# Patient Record
Sex: Male | Born: 1935
Health system: Southern US, Community
[De-identification: ages and names within clinical notes are randomized; demographics above are authoritative.]

## PROBLEM LIST (undated history)

## (undated) DIAGNOSIS — D649 Anemia, unspecified: Secondary | ICD-10-CM

## (undated) DIAGNOSIS — I2510 Atherosclerotic heart disease of native coronary artery without angina pectoris: Secondary | ICD-10-CM

## (undated) DIAGNOSIS — C801 Malignant (primary) neoplasm, unspecified: Secondary | ICD-10-CM

## (undated) DIAGNOSIS — J449 Chronic obstructive pulmonary disease, unspecified: Secondary | ICD-10-CM

## (undated) DIAGNOSIS — I340 Nonrheumatic mitral (valve) insufficiency: Secondary | ICD-10-CM

## (undated) DIAGNOSIS — J111 Influenza due to unidentified influenza virus with other respiratory manifestations: Secondary | ICD-10-CM

## (undated) DIAGNOSIS — K219 Gastro-esophageal reflux disease without esophagitis: Secondary | ICD-10-CM

## (undated) DIAGNOSIS — R112 Nausea with vomiting, unspecified: Secondary | ICD-10-CM

## (undated) DIAGNOSIS — R011 Cardiac murmur, unspecified: Secondary | ICD-10-CM

## (undated) DIAGNOSIS — E785 Hyperlipidemia, unspecified: Secondary | ICD-10-CM

## (undated) DIAGNOSIS — K5520 Angiodysplasia of colon without hemorrhage: Secondary | ICD-10-CM

## (undated) DIAGNOSIS — I341 Nonrheumatic mitral (valve) prolapse: Secondary | ICD-10-CM

## (undated) DIAGNOSIS — N400 Enlarged prostate without lower urinary tract symptoms: Secondary | ICD-10-CM

## (undated) DIAGNOSIS — Z87442 Personal history of urinary calculi: Secondary | ICD-10-CM

## (undated) DIAGNOSIS — J189 Pneumonia, unspecified organism: Secondary | ICD-10-CM

## (undated) DIAGNOSIS — M1990 Unspecified osteoarthritis, unspecified site: Secondary | ICD-10-CM

## (undated) DIAGNOSIS — Z9889 Other specified postprocedural states: Secondary | ICD-10-CM

## (undated) DIAGNOSIS — N189 Chronic kidney disease, unspecified: Secondary | ICD-10-CM

## (undated) DIAGNOSIS — R0602 Shortness of breath: Secondary | ICD-10-CM

## (undated) DIAGNOSIS — I10 Essential (primary) hypertension: Secondary | ICD-10-CM

## (undated) HISTORY — DX: Cardiac murmur, unspecified: R01.1

## (undated) HISTORY — DX: Nonrheumatic mitral (valve) prolapse: I34.1

## (undated) HISTORY — DX: Nonrheumatic mitral (valve) insufficiency: I34.0

## (undated) HISTORY — DX: Shortness of breath: R06.02

## (undated) HISTORY — DX: Unspecified osteoarthritis, unspecified site: M19.90

## (undated) HISTORY — DX: Atherosclerotic heart disease of native coronary artery without angina pectoris: I25.10

## (undated) HISTORY — DX: Angiodysplasia of colon without hemorrhage: K55.20

## (undated) HISTORY — PX: EYE SURGERY: SHX253

## (undated) HISTORY — DX: Chronic obstructive pulmonary disease, unspecified: J44.9

## (undated) HISTORY — PX: HERNIA REPAIR: SHX51

## (undated) HISTORY — DX: Essential (primary) hypertension: I10

## (undated) HISTORY — DX: Hyperlipidemia, unspecified: E78.5

## (undated) HISTORY — DX: Benign prostatic hyperplasia without lower urinary tract symptoms: N40.0

## (undated) SURGERY — ECHOCARDIOGRAM, TRANSESOPHAGEAL
Anesthesia: Moderate Sedation

---

## 1978-01-01 HISTORY — PX: LOBECTOMY: SHX5089

## 1997-12-08 ENCOUNTER — Ambulatory Visit (HOSPITAL_BASED_OUTPATIENT_CLINIC_OR_DEPARTMENT_OTHER): Admission: RE | Admit: 1997-12-08 | Discharge: 1997-12-08 | Payer: Self-pay | Admitting: General Surgery

## 2003-01-02 HISTORY — PX: REPLACEMENT TOTAL KNEE: SUR1224

## 2003-11-02 ENCOUNTER — Inpatient Hospital Stay (HOSPITAL_COMMUNITY): Admission: RE | Admit: 2003-11-02 | Discharge: 2003-11-06 | Payer: Self-pay | Admitting: Orthopedic Surgery

## 2004-05-30 ENCOUNTER — Encounter (INDEPENDENT_AMBULATORY_CARE_PROVIDER_SITE_OTHER): Payer: Self-pay | Admitting: *Deleted

## 2004-05-30 ENCOUNTER — Ambulatory Visit (HOSPITAL_COMMUNITY): Admission: RE | Admit: 2004-05-30 | Discharge: 2004-05-30 | Payer: Self-pay | Admitting: *Deleted

## 2004-09-19 ENCOUNTER — Encounter: Admission: RE | Admit: 2004-09-19 | Discharge: 2004-09-19 | Payer: Self-pay | Admitting: Internal Medicine

## 2004-10-09 ENCOUNTER — Encounter (HOSPITAL_COMMUNITY): Admission: RE | Admit: 2004-10-09 | Discharge: 2005-01-07 | Payer: Self-pay | Admitting: Nephrology

## 2005-01-08 ENCOUNTER — Encounter (HOSPITAL_COMMUNITY): Admission: RE | Admit: 2005-01-08 | Discharge: 2005-04-08 | Payer: Self-pay | Admitting: Nephrology

## 2005-06-05 ENCOUNTER — Encounter (HOSPITAL_COMMUNITY): Admission: RE | Admit: 2005-06-05 | Discharge: 2005-09-03 | Payer: Self-pay | Admitting: Nephrology

## 2005-09-27 ENCOUNTER — Encounter (HOSPITAL_COMMUNITY): Admission: RE | Admit: 2005-09-27 | Discharge: 2005-12-26 | Payer: Self-pay | Admitting: Nephrology

## 2009-01-01 HISTORY — PX: CATARACT EXTRACTION: SUR2

## 2009-08-01 HISTORY — PX: CARDIAC CATHETERIZATION: SHX172

## 2009-08-14 ENCOUNTER — Observation Stay (HOSPITAL_COMMUNITY): Admission: RE | Admit: 2009-08-14 | Discharge: 2009-08-15 | Payer: Self-pay | Admitting: Cardiovascular Disease

## 2009-12-09 ENCOUNTER — Ambulatory Visit (HOSPITAL_COMMUNITY)
Admission: RE | Admit: 2009-12-09 | Discharge: 2009-12-09 | Payer: Self-pay | Source: Home / Self Care | Attending: Gastroenterology | Admitting: Gastroenterology

## 2010-03-16 LAB — BASIC METABOLIC PANEL
BUN: 33 mg/dL — ABNORMAL HIGH (ref 6–23)
CO2: 24 mEq/L (ref 19–32)
Calcium: 9.3 mg/dL (ref 8.4–10.5)
Chloride: 106 mEq/L (ref 96–112)
Creatinine, Ser: 1.78 mg/dL — ABNORMAL HIGH (ref 0.4–1.5)
GFR calc Af Amer: 45 mL/min — ABNORMAL LOW (ref >60)
GFR calc non Af Amer: 37 mL/min — ABNORMAL LOW (ref >60)
Glucose, Bld: 116 mg/dL — ABNORMAL HIGH (ref 70–99)
Potassium: 4.4 mEq/L (ref 3.5–5.1)
Sodium: 140 mEq/L (ref 135–145)

## 2010-03-17 LAB — BASIC METABOLIC PANEL
BUN: 35 mg/dL — ABNORMAL HIGH (ref 6–23)
CO2: 24 mEq/L (ref 19–32)
Calcium: 9.2 mg/dL (ref 8.4–10.5)
Chloride: 104 mEq/L (ref 96–112)
Creatinine, Ser: 1.96 mg/dL — ABNORMAL HIGH (ref 0.4–1.5)
GFR calc Af Amer: 40 mL/min — ABNORMAL LOW (ref >60)
GFR calc non Af Amer: 33 mL/min — ABNORMAL LOW (ref >60)
Glucose, Bld: 159 mg/dL — ABNORMAL HIGH (ref 70–99)
Potassium: 4.7 mEq/L (ref 3.5–5.1)
Sodium: 134 mEq/L — ABNORMAL LOW (ref 135–145)

## 2010-03-17 LAB — CBC
HCT: 34.5 % — ABNORMAL LOW (ref 39.0–52.0)
Hemoglobin: 12.2 g/dL — ABNORMAL LOW (ref 13.0–17.0)
MCH: 32 pg (ref 26.0–34.0)
MCHC: 35.4 g/dL (ref 30.0–36.0)
MCV: 90.6 fL (ref 78.0–100.0)
Platelets: 138 10*3/uL — ABNORMAL LOW (ref 150–400)
RBC: 3.81 MIL/uL — ABNORMAL LOW (ref 4.22–5.81)
RDW: 13.2 % (ref 11.5–15.5)
WBC: 7.3 10*3/uL (ref 4.0–10.5)

## 2010-03-17 LAB — PROTIME-INR
INR: 1.06 (ref 0.00–1.49)
Prothrombin Time: 14 seconds (ref 11.6–15.2)

## 2010-03-17 LAB — TSH: TSH: 1.927 u[IU]/mL (ref 0.350–4.500)

## 2010-03-17 LAB — APTT: aPTT: 29 seconds (ref 24–37)

## 2010-05-19 NOTE — Discharge Summary (Signed)
[name], [name]              ACCOUNT NO.:  [id number]   MEDICAL RECORD NO.:  [id number]          PATIENT TYPE:  INP   LOCATION:  5030                         FACILITY:  [facility]   PHYSICIAN:  Deidre Ala, M.D.    DATE OF BIRTH:  May 03, 1925   DATE OF ADMISSION:  11/02/2003  DATE OF DISCHARGE:  11/06/2003                                 DISCHARGE SUMMARY   ADMISSION DIAGNOSES:  1.  End stage osteoarthritis of the right knee.  2.  Hypertension.   DISCHARGE DIAGNOSES:  1.  End stage osteoarthritis of the right knee, status post right total knee      arthroplasty.  2.  Hypertension.  3.  Postoperative hemorrhagic anemia, stable at the time of discharge.   PROCEDURE:  The patient was taken to the operating room, on November 02, 2003, and underwent a right total knee arthroplasty.  The surgeon was Dr.  Drema Pry.  Assistant was [employer], P.A.-C.  The surgery was  done under general anesthesia and a Hemovac drain x 2 were placed at the  time of surgery.   CONSULTS:  1.  Physical therapy.  2.  Occupational therapy.  3.  Social Web designer.   BRIEF HISTORY:  The patient is a 77 year old male with a longstanding  history of right knee pain.  He has previously failed conservative measures  up to this point including injections.  Radiographs taken in the office  revealed bone-on-bone arthritis in the medial compartment of the right knee.  Upon reviewing these x-rays, Dr. Renae Fickle felt it was best and with the failure  of conservative treatment to proceed with a right total knee arthroplasty.  The patient agreed.  The risks and benefits of the surgery were discussed  with the patient and the patient wished to proceed.   LABORATORY DATA:  CBC, on admission, showed a hemoglobin of 15.4, hematocrit  of 43.0, white blood cell count 7.4, red blood cell count 4.10.  Serial  H&H's were followed throughout the hospital stay.  Hemoglobin and hematocrit  did decline to  10.6 and 29.6, respectively, on November 05, 2003, but was  stable at the time of discharge.  Differential, on admission, was all within  normal limits.  Coagulation studies, on admission, were all within normal  limits.  PT INR at the time of discharge were 19.5 and 2.0, respectively on  Coumadin therapy.  Routine chemistry, on admission, showed a glucose high at  108.  Serial chemistries were followed throughout hospital stay.  Glucose  ranged from a low of 104, on November 05, 2003, to a high of 150 on November 03, 2003.  Sodium fell to 132 on November 04, 2003 and November 05, 2003 but  remained stable at the time of discharge.  Urinalysis, on admission, was all  within normal limits.  The patient's blood type is O negative and antibody  screen negative.   EKG, on admission, showed sinus rhythm with a first degree A-V block.  It  was an otherwise normal ECG.   I see no pre-op x-rays on the chart  at this time.   HOSPITAL COURSE:  The patient was admitted to [facility] and taken  to the operating room.  He underwent the above stated procedure without  complication.  The patient tolerated the procedure well, __________  orthopedic floor for continued postoperative care.  The patient, on post-op  day #1, resting comfortably.  He was complaining of nausea and vomiting.  Hemoglobin and hematocrit 11.3 and 31.5.  T-max was 99.  He was  neurovascularly intact to the right lower extremity.  The patient __________  physical therapy occupational therapy.  Percocet was changed to [employer] in the hopes of eliminating his nausea.  November 04, 2003, post-op  day #2, the patient was resting comfortably, decreased nausea and vomiting,  hemoglobin with hematocrit 10.8 and 29.9.  He was afebrile.  He was  neurovascularly intact to the right lower extremity.  Dressing was changed  today and the incision was clean, dry, and intact.  Hemovac was also D/C'd  today.  Mepergan was changed to  Vicodin.  He was placed on a Reglan  scheduled also.  He is to continue with physical therapy and occupational  therapy.  November 05, 2003, post-op #3.  The patient is resting comfortably.  He was slight slow with PT over the last few days.  His nausea and vomiting  were better.  Hemoglobin and hematocrit of 10.6 and 29.6.  He was  neurovascularly intact to the right lower extremity.  The incision was  clean, dry and intact.  The patient was to continue working with physical  therapy occupational therapy.  We are waiting on the patient's progress to  determine discharge.  November 06, 2003, post-op day #4.  The patient was  doing well with physical therapy.  He had met physical therapy goals and was  ready to be discharged.  The patient was subsequently discharged home on  November 06, 2003.   DISPOSITION:  The patient was discharged home on November 06, 2003.   DISCHARGE MEDICATIONS:  1.  Vicodin 1-2 p.o. q.4-6h. p.r.n. pain.  2.  Robaxin 500 mg one p.o. q.6-8h. p.r.n. spasm.  3.  Coumadin 6 mg one tablet every day until told otherwise.   ACTIVITY:  1.  The patient is weightbearing as tolerated to the right lower extremity.  2.  [facility] for home RN, PT, and OT.   WOUND CARE:  The patient is to perform daily dressing changes until no  drainage.  He may shower when no drainage.   FOLLOW UP:  The patient is to follow up with Dr. Renae Fickle two weeks from the  date of surgery.  He is to call the office for an appointment at [phone].   CONDITION ON DISCHARGE:  Stable and improved.       SW/MEDQ  D:  12/29/2003  T:  12/29/2003  Job:  [id number]

## 2010-05-19 NOTE — Op Note (Signed)
NAMEANANT, AGARD              ACCOUNT NO.:  [id number]   MEDICAL RECORD NO.:  [id number]          PATIENT TYPE:  INP   LOCATION:  2550                         FACILITY:  [facility]   PHYSICIAN:  Deidre Ala, M.D.    DATE OF BIRTH:  1925-09-28   DATE OF PROCEDURE:  11/02/2003  DATE OF DISCHARGE:                                 OPERATIVE REPORT   PREOPERATIVE DIAGNOSIS:  End-stage degenerative joint disease, right knee.   POSTOPERATIVE DIAGNOSIS:  End-stage degenerative joint disease, right knee.   PROCEDURE:  Right total knee arthroplasty using cemented DePuy components,  LCS-type rotating platform.   SURGEON:  1.  Charlesetta Shanks, M.D.   ASSISTANT:  Clarene Reamer, P.A.-C.   ANESTHESIA:  General endotracheal.   CULTURES:  None.   DRAINS:  Two medium Hemovacs to Autovac.   ESTIMATED BLOOD LOSS:  75 cc.   TOURNIQUET TIME:  1 hour and 14 minutes.   PATHOLOGICAL FINDINGS AND HISTORY:  [name] is a 77 year old male  patient of mine who I have followed for 7 years for degenerative changes in  the knee.  He has bone-on-bone and finally is to the point where he is  having significant discomfort and difficulty walking, pain with every step,  night pain, etc.  He desired to proceed with surgical intervention.  At  surgery, tricompartmental disease was noted, especially medially.  We placed  a standard plus femur, a #5 tibia, 12.5-mm insert rotating platform, and a  41-mm patella.  We had full extension where he had a slight flexion  contracture preoperatively with flexion to 120, stable to stressing and good  patellar tracking.  Antibiotics were used in the cement, Zinacef 750 mg per  batch for two batches.   DESCRIPTION OF PROCEDURE:  With adequate anesthesia obtained using  endotracheal technique and a femoral nerve block having been placed  preoperatively, the patient was placed in the supine position and the right  lower extremity was prepped from the toes to the  tourniquet in the standard  fashion.  After standard prepping and draping, Esmarch exsanguination was  used and the tourniquet was inflated up to 300 mmHg.  A median parapatellar  skin incision was followed by a median parapatellar retinacular incision.  The incision was deepened sharply with a knife and hemostasis obtained using  the Bovie electro coagulator.  We then everted the patella and excised the  fat pad, both menisci and the cruciates.  I then amputated the tibial spine  with a saw, placed the extramedullary tibial cutting jig and made the cut.  The cut was then slightly freshened with the guide, but no additional tibial  cut was made thereafter.  We did size the femur to a standard plus, placed  the intramedullary guide and anterior-posterior cutting jig for the femur,  set the rotation with a 10-mm insert but later felt that 12.5 was the better  flexion gap.  We then set the rotation, made the anterior-posterior cuts,  lollipop fit the 10 and the 12.5 and the 12.5 was tight at first but fit  perfectly in the end  and so we ended up going with the 12.5 after the first  cut on the femur.  We then placed a 4 degree valgus distal femoral cutting  jig in place and made the cut at the first notch.  The 10 was a little  hyper, the 12.5 was perfect and it fit in flexion, so we balanced the  flexion and extension gap and 12.5-mm insert.  We then placed anterior-  posterior chamfer and notch cutting jig in place and made those cuts as well  as the far posterior cuts with rather large posterior femoral condyles.  We  then exposed the tibia, sized to 5, made the central peg hole, broached for  the keel and then placed the 12.5 rotator platform on and the standard plus  femur on and articulated the knee to full extension and flexion to about  120.  We then counter fit the patella, it was somewhat eccentrically shaped,  we cut it down to about a 13, placed a 41-mm patellar button on,  replacing  11.  We made the holes for the patellar button with the three-hole template,  placed the three peg trial place and articulated the knee through a range of  motion.  I then removed all trial components and jet lavaged the knee while  the components were checked for sizing and cement was mixed with Zinacef.  We then exposed the proximal tibia, cemented on the tibial component,  impacted it and removed excess cement.  We then placed the 12.5 rotator  platform, we then cemented on the femoral component, impacted it and removed  excess cement.  We held the knee in full extension, removed excess cement,  and then held the knee in 40 degrees of flexion while the cement cured and  we cemented on the patella.  The patella was then cemented on and impacted  with the clamp, excess cement removed and held while the cement cured.  When  the cement was cured, the tourniquet was let down and bleeding points were  cauterized.  An additional jet lavage was carried out.  Hemovac drains were  placed in the mediolateral gutter and brought out through the superolateral  portal.  The wound was then closed in layers with #1 Vicryl on the  retinaculum, 0 and 2-0 Vicryl on the subcu and skin staples.  A bulky  sterile compression dressing was applied, the Hemovac hooked up to Autovac,  a knee immobilizer placed and the patient having tolerated the procedure  well was awakened and taken to the recovery room in satisfactory condition  for routine postoperative care, femoral nerve block and CPM.       VEP/MEDQ  D:  11/02/2003  T:  11/02/2003  Job:  [id number]   cc:   Lilia Pro, M.D.  [address]  [place]  [place] [address]  Fax: [phone]

## 2010-05-19 NOTE — Op Note (Signed)
NAMEMESSIYAH, [name]              ACCOUNT NO.:  [id number]   MEDICAL RECORD NO.:  [id number]          PATIENT TYPE:  AMB   LOCATION:  ENDO                         FACILITY:  [facility]   PHYSICIAN:  Georgiana Spinner, M.D.    DATE OF BIRTH:  Apr 15, 1925   DATE OF PROCEDURE:  DATE OF DISCHARGE:                                 OPERATIVE REPORT   PROCEDURE:  Colonoscopy.   INDICATIONS:  Hemoccult positivity.   ANESTHESIA:  Versed 7 milligrams, Demerol 65 milligrams.   PROCEDURE:  With the patient mildly sedated in the left lateral decubitus  position, the Olympus videoscopic colonoscope was inserted after normal  rectal exam and passed under direct vision to the cecum. There was some  pressure applied and it was slightly difficult to reach but we did get to  the cecum, identified by ileocecal valve and appendiceal orifice. Of note  there were two AVM's seen in the cecum. They were not bleeding.  They were  photographed only.  From this point colonoscope was then slowly withdrawn  taking circumferential views of colonic mucosa, stopping only in the rectum  which appeared normal on direct showed hemorrhoids on retroflexed view.  The  endoscope was straightened and withdrawn. The patient's vital signs, pulse  oximeter remained stable. The patient tolerated procedure well without  complications.   FINDINGS:  Arteriovenous malformations of cecum, internal hemorrhoids  otherwise unremarkable colonoscopic examination.   PLAN:  See endoscopy note for further details of follow-up. Of note the  patient received ampicillin and gentamicin prophylactic antibiotics for  these procedures.      GMO/MEDQ  D:  05/30/2004  T:  05/30/2004  Job:  [id number]

## 2010-05-19 NOTE — Op Note (Signed)
NAMESIGISMUND, CROSS              ACCOUNT NO.:  [id number]   MEDICAL RECORD NO.:  [id number]          PATIENT TYPE:  AMB   LOCATION:  ENDO                         FACILITY:  [facility]   PHYSICIAN:  Georgiana Spinner, M.D.    DATE OF BIRTH:  06/23/1925   DATE OF PROCEDURE:  05/30/2004  DATE OF DISCHARGE:                                 OPERATIVE REPORT   PROCEDURE:  Upper endoscopy.   INDICATIONS:  GERD and Hemoccult positivity.   ANESTHESIA:  Versed 1 mg.   DESCRIPTION OF PROCEDURE:  With the patient mildly sedated in the left  lateral decubitus position, the Olympus videoscopic endoscope was inserted  in the mouth and passed under direct vision through the esophagus which  appeared normal other than there was an ulcer at the GE junction. We entered  into the stomach and there was some mild amount of blood, but definitely  blackish material seen in the stomach. This was photographed. After we  suctioned this, the body of the stomach appeared to be quite erythematous.  This was photographed and multiple biopsies were taken. The prepyloric area  also had a focal area of the erythema, which also was photographed and  biopsied. The duodenal bulb and second portion of the duodenum appeared  normal. From this point the endoscope was then slowly withdrawn taking  circumferential views of duodenal mucosa until the endoscope had been pulled  back into the stomach and placed in retroflexion to view the stomach from  below. The endoscope was straightened and withdrawn, taking circumferential  views of the remaining gastric and esophageal mucosa. The patient's vital  signs and pulse oximeter remained stable. The patient tolerated procedure  well without apparent complications.   FINDINGS:  Erythema of antrum and body of stomach, both biopsied separately.  Ulcer of the esophagus, biopsied. Await biopsy report. The patient will call  me for results and follow up with me as an outpatient, but clearly  this  could be the cause of the patient's Hemoccult positivity.      GMO/MEDQ  D:  05/30/2004  T:  05/30/2004  Job:  [id number]

## 2010-05-19 NOTE — Op Note (Signed)
NAMENICHOLAI, WILLETTE              ACCOUNT NO.:  [id number]   MEDICAL RECORD NO.:  [id number]          PATIENT TYPE:  INP   LOCATION:  2899                         FACILITY:  [facility]   PHYSICIAN:  Deidre Ala, M.D.    DATE OF BIRTH:  10/04/1925   DATE OF PROCEDURE:  DATE OF DISCHARGE:                                 OPERATIVE REPORT   Audio too short to transcribe (less than 5 seconds)       VEP/MEDQ  D:  11/02/2003  T:  11/02/2003  Job:  [id number]

## 2011-04-02 HISTORY — PX: CARDIAC CATHETERIZATION: SHX172

## 2011-04-13 ENCOUNTER — Encounter (HOSPITAL_COMMUNITY): Payer: Self-pay | Admitting: Pharmacy Technician

## 2011-04-19 ENCOUNTER — Other Ambulatory Visit: Payer: Self-pay | Admitting: Cardiovascular Disease

## 2011-04-24 ENCOUNTER — Ambulatory Visit
Admission: RE | Admit: 2011-04-24 | Discharge: 2011-04-24 | Disposition: A | Discharge status: Home / Self Care | Payer: Medicare Other | Source: Ambulatory Visit | Attending: Cardiovascular Disease | Admitting: Cardiovascular Disease

## 2011-04-24 ENCOUNTER — Other Ambulatory Visit: Payer: Self-pay | Admitting: Cardiovascular Disease

## 2011-04-24 DIAGNOSIS — R0602 Shortness of breath: Secondary | ICD-10-CM

## 2011-04-25 ENCOUNTER — Other Ambulatory Visit (HOSPITAL_COMMUNITY): Payer: Self-pay | Admitting: Cardiology

## 2011-04-25 ENCOUNTER — Encounter (HOSPITAL_COMMUNITY): Payer: Self-pay | Admitting: Cardiology

## 2011-04-25 DIAGNOSIS — I340 Nonrheumatic mitral (valve) insufficiency: Secondary | ICD-10-CM

## 2011-04-25 DIAGNOSIS — N183 Chronic kidney disease, stage 3 unspecified: Secondary | ICD-10-CM

## 2011-04-25 DIAGNOSIS — I10 Essential (primary) hypertension: Secondary | ICD-10-CM | POA: Insufficient documentation

## 2011-04-25 DIAGNOSIS — R0600 Dyspnea, unspecified: Secondary | ICD-10-CM

## 2011-04-25 DIAGNOSIS — I2510 Atherosclerotic heart disease of native coronary artery without angina pectoris: Secondary | ICD-10-CM | POA: Insufficient documentation

## 2011-04-25 DIAGNOSIS — E785 Hyperlipidemia, unspecified: Secondary | ICD-10-CM | POA: Insufficient documentation

## 2011-04-25 DIAGNOSIS — M109 Gout, unspecified: Secondary | ICD-10-CM | POA: Insufficient documentation

## 2011-04-25 NOTE — Progress Notes (Signed)
Addended by: Corine Shelter on: 04/25/2011 01:53 PM   Modules accepted: Orders

## 2011-04-25 NOTE — H&P (Signed)
Patient ID: [name] MRN: [id number], DOB/AGE: Oct 24, 1925   Admit date: 04/30/11   Primary Physician: Dr [name] Primary Cardiologist: Dr [name]  HPI: 78 y/o pt with a history of MR. 2D Feb 2013 showed severe MR with borderline dil;ated LV. The pt has been followed by Dr [name]. He has recently complained of increasing dyspnea on exertion. He was seen by Dr [name] 04/13/11 and is to be admitted now for Rt and Lt heart cath. He is admitted the day before his procedure for IV hydration.   Problem List: No past medical history on file.  No past surgical history on file.   Allergies: No Known Allergies   Home Medications  (Not in a hospital admission)   No family history on file.   History   Social History  . Marital Status: Married    Spouse Name: N/A    Number of Children: N/A  . Years of Education: N/A   Occupational History  . Not on file.   Social History Main Topics  . Smoking status: Not on file  . Smokeless tobacco: Not on file  . Alcohol Use: Not on file  . Drug Use: Not on file  . Sexually Active: Not on file   Other Topics Concern  . Not on file   Social History Narrative  . No narrative on file     Review of Systems: General: negative for chills, fever, night sweats or weight changes.  Cardiovascular: negative for chest pain, dyspnea on exertion, edema, orthopnea, palpitations, paroxysmal nocturnal dyspnea or shortness of breath Dermatological: negative for rash Respiratory: negative for cough or wheezing Urologic: negative for hematuria Abdominal: negative for nausea, vomiting, diarrhea, bright red blood per rectum, melena, or hematemesis Neurologic: negative for visual changes, syncope, or dizziness All other systems reviewed and are otherwise negative except as noted above.  Physical Exam: 04/13/11 There were no vitals taken for this visit.  General appearance: alert, cooperative and no distress Neck: no carotid bruit, no JVD, supple,  symmetrical, trachea midline and thyroid not enlarged, symmetric, no tenderness/mass/nodules Lungs: clear to auscultation bilaterally Heart: regular rate and rhythm and 2/6 syst murmur apex Abdomen: not distended, non tender Extremities: no edema Pulses: 2+ and symmetric Skin: cool and dry Neurologic: Grossly normal    Labs:  No results found for this or any previous visit (from the past 24 hour(s)).     Radiology/Studies: Dg Chest 2 View  04/24/2011  *RADIOLOGY REPORT*  Clinical Data: Short of breath.  Pre cardiac cath  CHEST - 2 VIEW  Comparison: 08/14/2009  Findings: Surgical clips in the superior segment left lower lobe, unchanged.  No mass lesion is identified.  Negative for heart failure or pneumonia.  Mild pleural scarring in the apices bilaterally.  Mild COPD.  IMPRESSION: No acute cardiopulmonary disease.  Original Report Authenticated By: [name], M.D.    EKG: NSR without acute changes  ASSESSMENT AND PLAN:   Dyspnea Severe MR Moderate CAD by cath 8/11 Renal insufficiency, last SCr 1.6, Nov 2012 HTN Dyslipidemia COPD  Plan- admit for hydration to Rt and Lt heart cath  Tyler Pretty, PA-C 04/25/2011, 1:17 PM

## 2011-04-29 ENCOUNTER — Encounter (HOSPITAL_COMMUNITY): Payer: Self-pay

## 2011-04-29 ENCOUNTER — Inpatient Hospital Stay (HOSPITAL_COMMUNITY)
Admission: RE | Admit: 2011-04-29 | Discharge: 2011-05-01 | DRG: 287 | Disposition: A | Discharge status: Home / Self Care | Payer: Medicare Other | Source: Ambulatory Visit | Attending: Cardiovascular Disease | Admitting: Cardiovascular Disease

## 2011-04-29 DIAGNOSIS — J449 Chronic obstructive pulmonary disease, unspecified: Secondary | ICD-10-CM | POA: Diagnosis present

## 2011-04-29 DIAGNOSIS — M109 Gout, unspecified: Secondary | ICD-10-CM | POA: Diagnosis not present

## 2011-04-29 DIAGNOSIS — I129 Hypertensive chronic kidney disease with stage 1 through stage 4 chronic kidney disease, or unspecified chronic kidney disease: Secondary | ICD-10-CM | POA: Diagnosis present

## 2011-04-29 DIAGNOSIS — R0600 Dyspnea, unspecified: Secondary | ICD-10-CM | POA: Diagnosis present

## 2011-04-29 DIAGNOSIS — D649 Anemia, unspecified: Secondary | ICD-10-CM | POA: Diagnosis present

## 2011-04-29 DIAGNOSIS — I2510 Atherosclerotic heart disease of native coronary artery without angina pectoris: Secondary | ICD-10-CM | POA: Diagnosis present

## 2011-04-29 DIAGNOSIS — Z7982 Long term (current) use of aspirin: Secondary | ICD-10-CM

## 2011-04-29 DIAGNOSIS — Z79899 Other long term (current) drug therapy: Secondary | ICD-10-CM

## 2011-04-29 DIAGNOSIS — J4489 Other specified chronic obstructive pulmonary disease: Secondary | ICD-10-CM | POA: Diagnosis present

## 2011-04-29 DIAGNOSIS — I059 Rheumatic mitral valve disease, unspecified: Principal | ICD-10-CM | POA: Diagnosis present

## 2011-04-29 DIAGNOSIS — Z87891 Personal history of nicotine dependence: Secondary | ICD-10-CM

## 2011-04-29 DIAGNOSIS — E785 Hyperlipidemia, unspecified: Secondary | ICD-10-CM | POA: Diagnosis present

## 2011-04-29 DIAGNOSIS — I10 Essential (primary) hypertension: Secondary | ICD-10-CM | POA: Diagnosis present

## 2011-04-29 DIAGNOSIS — I340 Nonrheumatic mitral (valve) insufficiency: Secondary | ICD-10-CM | POA: Diagnosis present

## 2011-04-29 DIAGNOSIS — N183 Chronic kidney disease, stage 3 unspecified: Secondary | ICD-10-CM | POA: Diagnosis present

## 2011-04-29 HISTORY — DX: Other specified postprocedural states: R11.2

## 2011-04-29 HISTORY — DX: Other specified postprocedural states: Z98.890

## 2011-04-29 LAB — CBC
HCT: 24.7 % — ABNORMAL LOW (ref 39.0–52.0)
Hemoglobin: 8.1 g/dL — ABNORMAL LOW (ref 13.0–17.0)
MCH: 26.7 pg (ref 26.0–34.0)
MCHC: 32.8 g/dL (ref 30.0–36.0)
MCV: 81.5 fL (ref 78.0–100.0)
Platelets: 148 10*3/uL — ABNORMAL LOW (ref 150–400)
RBC: 3.03 MIL/uL — ABNORMAL LOW (ref 4.22–5.81)
RDW: 14.1 % (ref 11.5–15.5)
WBC: 5.5 10*3/uL (ref 4.0–10.5)

## 2011-04-29 LAB — IRON AND TIBC
Iron: 11 ug/dL — ABNORMAL LOW (ref 42–135)
Saturation Ratios: 3 % — ABNORMAL LOW (ref 20–55)
TIBC: 331 ug/dL (ref 215–435)
UIBC: 320 ug/dL (ref 125–400)

## 2011-04-29 LAB — URINALYSIS, ROUTINE W REFLEX MICROSCOPIC
Bilirubin Urine: NEGATIVE
Glucose, UA: NEGATIVE mg/dL
Hgb urine dipstick: NEGATIVE
Ketones, ur: NEGATIVE mg/dL
Leukocytes, UA: NEGATIVE
Nitrite: NEGATIVE
Protein, ur: NEGATIVE mg/dL
Specific Gravity, Urine: 1.008 (ref 1.005–1.030)
Urobilinogen, UA: 0.2 mg/dL (ref 0.0–1.0)
pH: 5.5 (ref 5.0–8.0)

## 2011-04-29 LAB — PROTIME-INR
INR: 1.05 (ref 0.00–1.49)
Prothrombin Time: 13.9 seconds (ref 11.6–15.2)

## 2011-04-29 LAB — TSH: TSH: 1.209 u[IU]/mL (ref 0.350–4.500)

## 2011-04-29 LAB — MRSA PCR SCREENING: MRSA by PCR: NEGATIVE

## 2011-04-29 MED ORDER — SODIUM CHLORIDE 0.9 % IV SOLN
100.0000 mg | Freq: Every day | INTRAVENOUS | Status: DC
  Administered 2011-04-30: 100 mg via INTRAVENOUS
  Filled 2011-04-29 (×4): qty 100

## 2011-04-29 MED ORDER — PANTOPRAZOLE SODIUM 40 MG PO TBEC
40.0000 mg | DELAYED_RELEASE_TABLET | Freq: Every day | ORAL | Status: DC
  Administered 2011-05-01: 40 mg via ORAL
  Filled 2011-04-29 (×2): qty 1

## 2011-04-29 MED ORDER — SODIUM CHLORIDE 0.9 % IV SOLN
INTRAVENOUS | Status: DC
  Administered 2011-04-29 – 2011-04-30 (×2): via INTRAVENOUS

## 2011-04-29 MED ORDER — ALPRAZOLAM 0.25 MG PO TABS: 0.2500 mg | ORAL_TABLET | Freq: Three times a day (TID) | ORAL | Status: DC | PRN

## 2011-04-29 MED ORDER — ZOLPIDEM TARTRATE 5 MG PO TABS: 5.0000 mg | ORAL_TABLET | Freq: Every evening | ORAL | Status: DC | PRN

## 2011-04-29 MED ORDER — ATORVASTATIN CALCIUM 10 MG PO TABS
10.0000 mg | ORAL_TABLET | Freq: Every day | ORAL | Status: DC
  Administered 2011-04-29 – 2011-04-30 (×2): 10 mg via ORAL
  Filled 2011-04-29 (×4): qty 1

## 2011-04-29 MED ORDER — SODIUM CHLORIDE 0.9 % IJ SOLN: 3.0000 mL | INTRAMUSCULAR | Status: DC | PRN

## 2011-04-29 MED ORDER — ATORVASTATIN CALCIUM 10 MG PO TABS
10.0000 mg | ORAL_TABLET | Freq: Every day | ORAL | Status: DC
  Filled 2011-04-29: qty 1

## 2011-04-29 MED ORDER — ACETAMINOPHEN 325 MG PO TABS: 650.0000 mg | ORAL_TABLET | ORAL | Status: DC | PRN

## 2011-04-29 MED ORDER — ASPIRIN 81 MG PO CHEW: 81.0000 mg | CHEWABLE_TABLET | Freq: Every day | ORAL | Status: DC

## 2011-04-29 MED ORDER — DILTIAZEM HCL ER COATED BEADS 300 MG PO CP24: 300.0000 mg | ORAL_CAPSULE | Freq: Every day | ORAL | Status: DC

## 2011-04-30 ENCOUNTER — Encounter (HOSPITAL_COMMUNITY)
Admission: RE | Disposition: A | Discharge status: Home / Self Care | Payer: Self-pay | Source: Ambulatory Visit | Attending: Cardiovascular Disease

## 2011-04-30 ENCOUNTER — Encounter (HOSPITAL_COMMUNITY): Payer: Self-pay

## 2011-04-30 HISTORY — PX: TEE WITHOUT CARDIOVERSION: SHX5443

## 2011-04-30 HISTORY — PX: CORONARY ANGIOGRAM: SHX5466

## 2011-04-30 HISTORY — PX: RIGHT HEART CATHETERIZATION: SHX5447

## 2011-04-30 HISTORY — PX: ABDOMINAL ANGIOGRAM: SHX5499

## 2011-04-30 LAB — POCT I-STAT 3, ART BLOOD GAS (G3+)
Acid-base deficit: 5 mmol/L — ABNORMAL HIGH (ref 0.0–2.0)
Bicarbonate: 19 mEq/L — ABNORMAL LOW (ref 20.0–24.0)
TCO2: 20 mmol/L (ref 0–100)

## 2011-04-30 LAB — COMPREHENSIVE METABOLIC PANEL
ALT: 7 U/L (ref 0–53)
AST: 14 U/L (ref 0–37)
Albumin: 3.1 g/dL — ABNORMAL LOW (ref 3.5–5.2)
Alkaline Phosphatase: 63 U/L (ref 39–117)
BUN: 26 mg/dL — ABNORMAL HIGH (ref 6–23)
CO2: 21 mEq/L (ref 19–32)
Calcium: 9 mg/dL (ref 8.4–10.5)
Chloride: 107 mEq/L (ref 96–112)
Creatinine, Ser: 1.64 mg/dL — ABNORMAL HIGH (ref 0.50–1.35)
GFR calc Af Amer: 43 mL/min — ABNORMAL LOW (ref >90)
GFR calc non Af Amer: 37 mL/min — ABNORMAL LOW (ref >90)
Glucose, Bld: 118 mg/dL — ABNORMAL HIGH (ref 70–99)
Potassium: 4.2 mEq/L (ref 3.5–5.1)
Sodium: 137 mEq/L (ref 135–145)
Total Bilirubin: 0.2 mg/dL — ABNORMAL LOW (ref 0.3–1.2)
Total Protein: 5.7 g/dL — ABNORMAL LOW (ref 6.0–8.3)

## 2011-04-30 LAB — CBC
HCT: 23.7 % — ABNORMAL LOW (ref 39.0–52.0)
Hemoglobin: 7.7 g/dL — ABNORMAL LOW (ref 13.0–17.0)
MCH: 26.4 pg (ref 26.0–34.0)
MCHC: 32.5 g/dL (ref 30.0–36.0)
MCV: 81.2 fL (ref 78.0–100.0)
Platelets: 141 10*3/uL — ABNORMAL LOW (ref 150–400)
RBC: 2.92 MIL/uL — ABNORMAL LOW (ref 4.22–5.81)
RDW: 14 % (ref 11.5–15.5)
WBC: 3.8 10*3/uL — ABNORMAL LOW (ref 4.0–10.5)

## 2011-04-30 LAB — POCT I-STAT 3, VENOUS BLOOD GAS (G3P V)
Bicarbonate: 19.2 mEq/L — ABNORMAL LOW (ref 20.0–24.0)
TCO2: 20 mmol/L (ref 0–100)
pCO2, Ven: 35.5 mmHg — ABNORMAL LOW (ref 45.0–50.0)
pH, Ven: 7.34 — ABNORMAL HIGH (ref 7.250–7.300)

## 2011-04-30 SURGERY — ECHOCARDIOGRAM, TRANSESOPHAGEAL
Anesthesia: Moderate Sedation

## 2011-04-30 SURGERY — CORONARY ANGIOGRAM

## 2011-04-30 MED ORDER — LIDOCAINE HCL (PF) 1 % IJ SOLN
INTRAMUSCULAR | Status: AC
  Filled 2011-04-30: qty 30

## 2011-04-30 MED ORDER — MIDAZOLAM HCL 10 MG/2ML IJ SOLN: 10.0000 mg | Freq: Once | INTRAMUSCULAR | Status: DC

## 2011-04-30 MED ORDER — SODIUM CHLORIDE 0.9 % IJ SOLN: 3.0000 mL | Freq: Two times a day (BID) | INTRAMUSCULAR | Status: DC

## 2011-04-30 MED ORDER — ONDANSETRON HCL 4 MG/2ML IJ SOLN: 4.0000 mg | Freq: Four times a day (QID) | INTRAMUSCULAR | Status: DC | PRN

## 2011-04-30 MED ORDER — BENZOCAINE 20 % MT SOLN: 1.0000 "application " | OROMUCOSAL | Status: DC | PRN

## 2011-04-30 MED ORDER — SODIUM CHLORIDE 0.9 % IV SOLN
INTRAVENOUS | Status: AC
  Administered 2011-04-30: 12:00:00 via INTRAVENOUS

## 2011-04-30 MED ORDER — DILTIAZEM HCL ER BEADS 300 MG PO CP24
300.0000 mg | ORAL_CAPSULE | Freq: Every day | ORAL | Status: DC
  Administered 2011-04-30 – 2011-05-01 (×2): 300 mg via ORAL
  Filled 2011-04-30 (×2): qty 1

## 2011-04-30 MED ORDER — MIDAZOLAM HCL 10 MG/2ML IJ SOLN
INTRAMUSCULAR | Status: AC
  Filled 2011-04-30: qty 2

## 2011-04-30 MED ORDER — ACETAMINOPHEN 325 MG PO TABS: 650.0000 mg | ORAL_TABLET | ORAL | Status: DC | PRN

## 2011-04-30 MED ORDER — FENTANYL CITRATE 0.05 MG/ML IJ SOLN
INTRAMUSCULAR | Status: DC | PRN
  Administered 2011-04-30 (×2): 25 ug via INTRAVENOUS

## 2011-04-30 MED ORDER — FENTANYL CITRATE 0.05 MG/ML IJ SOLN
INTRAMUSCULAR | Status: AC
  Filled 2011-04-30: qty 2

## 2011-04-30 MED ORDER — FENTANYL CITRATE 0.05 MG/ML IJ SOLN: 250.0000 ug | Freq: Once | INTRAMUSCULAR | Status: DC

## 2011-04-30 MED ORDER — MIDAZOLAM HCL 10 MG/2ML IJ SOLN
INTRAMUSCULAR | Status: DC | PRN
  Administered 2011-04-30: 1 mg via INTRAVENOUS
  Administered 2011-04-30 (×2): 2 mg via INTRAVENOUS

## 2011-04-30 MED ORDER — HEPARIN (PORCINE) IN NACL 2-0.9 UNIT/ML-% IJ SOLN
INTRAMUSCULAR | Status: AC
  Filled 2011-04-30: qty 1000

## 2011-04-30 MED ORDER — NITROGLYCERIN 0.2 MG/ML ON CALL CATH LAB
INTRAVENOUS | Status: AC
  Filled 2011-04-30: qty 1

## 2011-04-30 MED ORDER — ASPIRIN EC 325 MG PO TBEC
325.0000 mg | DELAYED_RELEASE_TABLET | Freq: Every day | ORAL | Status: DC
  Administered 2011-05-01: 325 mg via ORAL
  Filled 2011-04-30: qty 1

## 2011-04-30 MED ORDER — SODIUM CHLORIDE 0.45 % IV SOLN: INTRAVENOUS | Status: DC

## 2011-04-30 MED ORDER — SODIUM CHLORIDE 0.9 % IJ SOLN: 3.0000 mL | INTRAMUSCULAR | Status: DC | PRN

## 2011-04-30 MED ORDER — SODIUM CHLORIDE 0.9 % IV SOLN: 250.0000 mL | INTRAVENOUS | Status: DC | PRN

## 2011-04-30 MED ORDER — BUTAMBEN-TETRACAINE-BENZOCAINE 2-2-14 % EX AERO
INHALATION_SPRAY | CUTANEOUS | Status: DC | PRN
  Administered 2011-04-30: 2 via TOPICAL

## 2011-04-30 NOTE — Op Note (Signed)
[name] is a 78 y.o. male    [id number] LOCATION:  FACILITY: [facility]  PHYSICIAN: Nanetta Batty, M.D. [birth year]   DATE OF PROCEDURE:  04/30/2011  DATE OF DISCHARGE:  [facility]  CARDIAC CATHETERIZATION     History obtained from chart review. 77 y/o pt with a history of MR. 2D Feb 2013 showed severe MR with borderline dil;ated LV. The pt has been followed by Dr Allyson Sabal. He has recently complained of increasing dyspnea on exertion. He was seen by Dr Allyson Sabal 04/13/11 and is to be admitted now for Rt and Lt heart cath. He is admitted the day before his procedure for IV hydration.    PROCEDURE DESCRIPTION:    The patient was brought to the second floor  [place] [facility]iac cath lab in the postabsorptive state. He was not  premedicated . His left groin was prepped and shaved in usual sterile fashion. Xylocaine 1% was used  for local anesthesia. A 5 French sheath was inserted into the left common femoral  artery using standard Seldinger technique.a 7 [place] sheath was inserted into the left common femoral vein using standard Seldinger technique. A 7 [place] balloon-tip Swan-Ganz catheter was then advanced through the right heart chambers using sequential pressures as well as blood samples to allow the performance Fick and thermal dilution cardiac outputs. 5 French right and left Judkins diagnostic catheters as well as a 5 French pigtail catheter was used for selective coronary angiography and abdominal aortography. Visipaque dye was used for the entirety of the case (95 cc). Retrograde aortic pressure was monitored during the case.    HEMODYNAMICS:    AO SYSTOLIC/AO DIASTOLIC: 151/63  RA pressure: 10/9  RV pressure: 13/12  Pulmonary artery pressure: 33/11 mean of 22  Pulmonary capillary. Wedge pressure:17/16, mean 14  Cardiac index by Fick was 4.1 L per minute per meter squared, and by thermodilution was 2.3 L per minute per meter  squared   ANGIOGRAPHIC RESULTS:   1. Left main; normal  2. LAD; minor irregularities 3. Left circumflex; normal. There was a high marginal branch and the ramus distribution though small in caliber and had a focal 50% proximal stenosis.  4. Right coronary artery; 70% just after the takeoff of a high acute marginal branch 5. Abdominal aortogram: The thoracic abdominal aorta associated renal abdominal, aortoiliac medication were free of significant atherosclerotic changes  IMPRESSION:[name] has severe MR by Three-D TEE and normal LV function with essentially one-vessel CAD involving the right coronary artery. He does have moderate renal insufficiency I will be hydrated. He is also significantly anemic and will be transfused 2 units of packed red blood cells. Dr. Jeani Hawking, gastroenterologist, who has seen the patient in the past for gastric AVMs will be consulted.  Runell Gess MD, [facility] 04/30/2011 11:37 AM

## 2011-04-30 NOTE — CV Procedure (Signed)
INDICATIONS: Mitral insufficiency  PROCEDURE:   Informed consent was obtained prior to the procedure. The risks, benefits and alternatives for the procedure were discussed and the patient comprehended these risks.  Risks include, but are not limited to, cough, sore throat, vomiting, nausea, somnolence, esophageal and stomach trauma or perforation, bleeding, low blood pressure, aspiration, pneumonia, infection, trauma to the teeth and death.    After a procedural time-out, the oropharynx was anesthetized with 20% benzocaine spray. The patient was given 5 mg versed and 50 mcg fentanyl for moderate sedation.   The transesophageal probe was inserted in the esophagus and stomach without difficulty and multiple views were obtained.  The patient was kept under observation until the patient left the procedure room.  The patient left the procedure room in stable condition.   Agitated microbubble saline contrast was not administered.  COMPLICATIONS:    There were no immediate complications.  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 Preserved LV systolic function (EF60%). Moderate TR. Mild pulmonary HTN.  RECOMMENDATIONS:   Proceed with heart cath prior to surgical valve repair.  Time Spent Directly with the Patient:  35 minutes   Hesham Womac 04/30/2011, 9:39 AM

## 2011-04-30 NOTE — H&P (Signed)
    Pt was reexamined and existing H & P reviewed. No changes found.  Runell Gess, MD [facility] 04/30/2011 10:39 AM

## 2011-04-30 NOTE — H&P (Signed)
  Date of Initial H&P: 04/25/2011  History reviewed, patient examined, no change in status, stable for surgery. TEE for Mr evaluation. Thurmon Fair, MD, [facility] [phone] office [phone] pager 04/30/2011 8:21 AM

## 2011-04-30 NOTE — Progress Notes (Signed)
TCTS BRIEF PROGRESS NOTE   Patient seen and examined, chart, TEE and cath reviewed.  Full consultation note to follow.    Will await consultation from GI and further work up for anemia before making any plans for surgery.  Rankin Coolman H 04/30/2011 8:21 PM

## 2011-04-30 NOTE — Progress Notes (Signed)
UR Completed. Simmons, Rafiel Mecca F [phone]  

## 2011-04-30 NOTE — Progress Notes (Signed)
  Echocardiogram Echocardiogram Transesophageal has been performed.  [name] 04/30/2011, 9:56 AM

## 2011-04-30 NOTE — Progress Notes (Signed)
   CARE MANAGEMENT NOTE 04/30/2011  Patient:  [name], [name]   Account Number:  [id number]  Date Initiated:  04/30/2011  Documentation initiated by:  GRAVES-BIGELOW,Deshon Hsiao  Subjective/Objective Assessment:   Pt admitted with hx MR/ renal insufficiency. Per MD notes pt having increased SOB. Plan for L& RHC- plan for transfusion 2 units PRBC for anemia.     Action/Plan:   Anticipated DC Date:  05/02/2011   Anticipated DC Plan:  HOME W HOME HEALTH SERVICES      DC Planning Services  CM consult      Choice offered to / List presented to:             Status of service:  In process, will continue to follow Medicare Important Message given?   (If response is "NO", the following Medicare IM given date fields will be blank) Date Medicare IM given:   Date Additional Medicare IM given:    Discharge Disposition:    Per UR Regulation:    If discussed at Long Length of Stay Meetings, dates discussed:    Comments:  04-30-11 1600 Tomi Bamberger, RN,BSN [phone] CM will continue to monitor for disposition needs.

## 2011-05-01 ENCOUNTER — Encounter (HOSPITAL_COMMUNITY): Payer: Self-pay | Admitting: Cardiovascular Disease

## 2011-05-01 DIAGNOSIS — D649 Anemia, unspecified: Secondary | ICD-10-CM | POA: Diagnosis present

## 2011-05-01 LAB — BASIC METABOLIC PANEL
BUN: 28 mg/dL — ABNORMAL HIGH (ref 6–23)
CO2: 20 mEq/L (ref 19–32)
Calcium: 9.4 mg/dL (ref 8.4–10.5)
Chloride: 104 mEq/L (ref 96–112)
Creatinine, Ser: 1.64 mg/dL — ABNORMAL HIGH (ref 0.50–1.35)
GFR calc Af Amer: 43 mL/min — ABNORMAL LOW (ref >90)
GFR calc non Af Amer: 37 mL/min — ABNORMAL LOW (ref >90)
Glucose, Bld: 199 mg/dL — ABNORMAL HIGH (ref 70–99)
Potassium: 4.4 mEq/L (ref 3.5–5.1)
Sodium: 138 mEq/L (ref 135–145)

## 2011-05-01 LAB — HEMOGLOBIN AND HEMATOCRIT, BLOOD
HCT: 27.1 % — ABNORMAL LOW (ref 39.0–52.0)
Hemoglobin: 8.6 g/dL — ABNORMAL LOW (ref 13.0–17.0)

## 2011-05-01 LAB — PREPARE RBC (CROSSMATCH)

## 2011-05-01 MED ORDER — ACETAMINOPHEN 325 MG PO TABS: 650.0000 mg | ORAL_TABLET | ORAL | Status: DC | PRN

## 2011-05-01 MED ORDER — FERROUS SULFATE 325 (65 FE) MG PO TABS: 325.0000 mg | ORAL_TABLET | Freq: Every day | ORAL | Status: DC

## 2011-05-01 MED ORDER — ALLOPURINOL 100 MG PO TABS
100.0000 mg | ORAL_TABLET | Freq: Every day | ORAL | Status: DC
  Administered 2011-05-01: 100 mg via ORAL
  Filled 2011-05-01: qty 1

## 2011-05-01 MED ORDER — ASPIRIN 81 MG PO CHEW: 81.0000 mg | CHEWABLE_TABLET | Freq: Every day | ORAL | Status: DC

## 2011-05-01 NOTE — Progress Notes (Signed)
Subjective:  No SOB. He says he had a BM that was checked and was negative, no record of this in the chart. He does not appear to be pale to me.  Objective:  Vital Signs in the last 24 hours: Temp:  [97.7 F (36.5 C)-98.1 F (36.7 C)] 97.7 F (36.5 C) (04/30 0812) Pulse Rate:  [52-77] 66  (04/30 0812) Resp:  [6-22] 18  (04/30 0812) BP: (109-169)/(43-75) 149/65 mmHg (04/30 0812) SpO2:  [92 %-100 %] 100 % (04/30 0812) Weight:  [77.9 kg (171 lb 11.8 oz)] 77.9 kg (171 lb 11.8 oz) (04/30 0400)  Intake/Output from previous day:  Intake/Output Summary (Last 24 hours) at 05/01/11 0901 Last data filed at 05/01/11 0005  Gross per 24 hour  Intake    800 ml  Output    375 ml  Net    425 ml    Physical Exam: General appearance: alert, cooperative and no distress Lungs: clear to auscultation bilaterally Heart: regular rate and rhythm and 2/6 systolic murmur Lt groin without hematoma   Rate: 85  Rhythm: normal sinus rhythm  Lab Results:  Basename 04/30/11 0504 04/29/11 1440  WBC 3.8* 5.5  HGB 7.7* 8.1*  PLT 141* 148*    Basename 04/30/11 0504  NA 137  K 4.2  CL 107  CO2 21  GLUCOSE 118*  BUN 26*  CREATININE 1.64*   No results found for this basename: TROPONINI:2,CK,MB:2 in the last 72 hours Hepatic Function Panel  Basename 04/30/11 0504  PROT 5.7*  ALBUMIN 3.1*  AST 14  ALT 7  ALKPHOS 63  BILITOT 0.2*  BILIDIR --  IBILI --   No results found for this basename: CHOL in the last 72 hours  Basename 04/29/11 1440  INR 1.05    Imaging: Imaging results have been reviewed  Cardiac Studies:  Assessment/Plan:   Principal Problem:  *Dyspnea Active Problems:  Mitral regurgitation, severe  CAD, single vessel RCA  HTN (hypertension)  Chronic renal insufficiency, stage III (moderate)  Anemia, Hx of colonic AVMs 12/11  Dyslipidemia  Gout  Plan- check Hgb/Hct, try again to contact GI, transfuse if Hgb < 8. Check BMP this am.    Corine Shelter PA-C 05/01/2011,  9:01 AM    Agree with note written by Corine Shelter PAC  Hgb appears better. Groin OK. Dr. Cornelius Moras has seen briefly and set up an out pt office appointment. Dr. Elnoria Howard to see today for eval of decreased Hgb. ? EGD. Will need to go on Fe as OP.   Runell Gess 05/01/2011 11:13 AM

## 2011-05-01 NOTE — Consult Note (Signed)
Reason for Consult: Anemia and history of AVMs Referring Physician: Johnathan Berry, M.D.  [name] HPI: This is an 78 year old patient who is well-known to me with a history of duodenal and proximal colonic AVMs.  The patient was evaluated in the past for an anemia and the procedures identified the lesions.  His EGD revealed a nonbleeding duodenal AVM and the colon exhibited two large cecal and ascending colon AVMs (11/2009).  The lesions were ablated and his HGB increased over a 6 month period with iron supplementation.  His last HGB check in the office was on 07/2010 with a value of 13.4 g/dL.  He had another check by another physician in 11/2010 with a value of 13.1 g/dL.  From that time point until the time of his admission his HGB dropped down to the 7 range.  He is heme negative and his cardiac cath only revealed mild CAD.  He has a history of severe MR.  As a result of his anemia, GI was consulted.  No reports of melena, hematochezia, diarrhea, or constipation.  Past Medical History  Diagnosis Date  . Coronary artery disease   . Hypertension   . Heart murmur   . COPD (chronic obstructive pulmonary disease)   . Shortness of breath   . Arthritis   . PONV (postoperative nausea and vomiting)     Past Surgical History  Procedure Date  . Cardiac catheterization   . Lobectomy 1963  . Knee surgery 2005  . Hernia repair 1999&2000  . Tee without cardioversion 04/30/2011    Procedure: TRANSESOPHAGEAL ECHOCARDIOGRAM (TEE);  Surgeon: Mihai Croitoru, MD;  Location: [facility];  Service: Cardiovascular;  Laterality: N/A;  3D TEE/ patient will be admitted the day before test , therefo patient will be a inpatient the day of TEE    History reviewed. No pertinent family history.  Social History:  reports that he quit smoking about 50 years ago. He quit smokeless tobacco use about 50 years ago. He reports that he drinks about .6 ounces of alcohol per week. He reports that he does not use  illicit drugs.  Allergies: No Known Allergies  Medications:  Scheduled:   . allopurinol  100 mg Oral Daily  . aspirin  81 mg Oral Daily  . atorvastatin  10 mg Oral q1800  . diltiazem  300 mg Oral Daily  . pantoprazole  40 mg Oral Q0600  . DISCONTD: allopurinol (ZYLOPRIM) IVPB  100 mg Intravenous Daily  . DISCONTD: aspirin  81 mg Oral Daily  . DISCONTD: aspirin EC  325 mg Oral Daily  . DISCONTD: fentaNYL  250 mcg Intravenous Once  . DISCONTD: midazolam  10 mg Intravenous Once  . DISCONTD: sodium chloride  3 mL Intravenous Q12H   Continuous:   . sodium chloride 75 mL/hr at 04/30/11 1130  . DISCONTD: sodium chloride    . DISCONTD: sodium chloride 75 mL/hr at 04/30/11 0638    Results for orders placed during the hospital encounter of 04/29/11 (from the past 24 hour(s))  OCCULT BLOOD X 1 CARD TO LAB, STOOL     Status: Normal   Collection Time   04/30/11  9:05 PM      Component Value Range   Fecal Occult Bld NEGATIVE    HEMOGLOBIN AND HEMATOCRIT, BLOOD     Status: Abnormal   Collection Time   05/01/11  9:11 AM      Component Value Range   Hemoglobin 8.6 (*) 13.0 - 17.0 (g/dL)     HCT 27.1 (*) 39.0 - 52.0 (%)  BASIC METABOLIC PANEL     Status: Abnormal   Collection Time   05/01/11  9:11 AM      Component Value Range   Sodium 138  135 - 145 (mEq/L)   Potassium 4.4  3.5 - 5.1 (mEq/L)   Chloride 104  96 - 112 (mEq/L)   CO2 20  19 - 32 (mEq/L)   Glucose, Bld 199 (*) 70 - 99 (mg/dL)   BUN 28 (*) 6 - 23 (mg/dL)   Creatinine, Ser 1.64 (*) 0.50 - 1.35 (mg/dL)   Calcium 9.4  8.4 - 10.5 (mg/dL)   GFR calc non Af Amer 37 (*) >90 (mL/min)   GFR calc Af Amer 43 (*) >90 (mL/min)  PREPARE RBC (CROSSMATCH)     Status: Normal   Collection Time   05/01/11  9:20 AM      Component Value Range   Order Confirmation ORDER PROCESSED BY BLOOD BANK    TYPE AND SCREEN     Status: Normal (Preliminary result)   Collection Time   05/01/11  9:20 AM      Component Value Range   ABO/RH(D) O NEG      Antibody Screen NEG     Sample Expiration 05/04/2011     Unit Number [id number]     Blood Component Type RBC CPDA1, LR     Unit division 00     Status of Unit ALLOCATED     Transfusion Status OK TO TRANSFUSE     Crossmatch Result Compatible     Unit Number [id number]     Blood Component Type RED CELLS,LR     Unit division 00     Status of Unit ALLOCATED     Transfusion Status OK TO TRANSFUSE     Crossmatch Result Compatible    ABO/RH     Status: Normal   Collection Time   05/01/11  9:20 AM      Component Value Range   ABO/RH(D) O NEG       No results found.  ROS:  As stated above in the HPI otherwise negative.  Blood pressure 149/65, pulse 66, temperature 97.7 F (36.5 C), temperature source Oral, resp. rate 18, height 6' 1" (1.854 m), weight 77.9 kg (171 lb 11.8 oz), SpO2 100.00%.    PE: Gen: NAD, Alert and Oriented HEENT:  [place]amp/AT, EOMI Neck: Supple, no LAD Lungs: CTA Bilaterally CV: RRR ABM: Soft, NTND, +BS Ext: No C/C/E  Assessment/Plan: 1) Recurrent anemia. 2) History of AVMs. 3) CAD and MR.   The patient is stable from the cardiac standpoint.  He is s/p 2 units of PRBC and current HGB is at 8.6 g/dL.  No complaints of any chest pain or SOB.  I offered to perform the EGD/Colonoscopy tomorrow, but he would like to go home first.  From a GI standpoint it is not an unreasonable request and I spoke with Dr. Berry who feels that he can go home.  I will have him undergo the procedures this coming Friday.  Plan: 1) Outpatient EGD/Colonoscopy this Friday.  I will make the arrangements. 2) He knows to return to the hospital if his condition changes, i.e., hematochezia, melena, chest pain, or SOB.  [name] D 05/01/2011, 12:47 PM       

## 2011-05-01 NOTE — Discharge Summary (Signed)
Physician Discharge Summary  Patient ID: [name] MRN: [id number] DOB/AGE: [birth year] 78 y.o.  Admit date: 04/29/2011 Discharge date: 05/01/2011  Discharge Diagnoses:  Principal Problem:  *Dyspnea Active Problems:  Mitral regurgitation, severe  Anemia, recurrent Hx of colonic AVMs 12/11, transfused, heme stool neg.  CAD, single vessel RCA  Chronic renal insufficiency, stage III (moderate)  HTN (hypertension)  Dyslipidemia  Gout   Discharged Condition: good  Procedures:  04/30/2011 TEE by Dr. Royann Shivers.  04/30/2011 right and left heart catheterization by Dr. Nanetta Batty.  Hospital Course: 78 year old white male was seen by Dr. Allyson Sabal 04/02/2011 secondary to dyspnea. He has known Coronary artery disease by cath in 2001 had 50-60% stenosis in its RCA. He had normal LV function. He was diagnosed with borderline dilated left ventricle with severe mitral regurg, mitral valve prolapse and severe atrial enlargement. Additionally he has hypertension and hyperlipidemia. He denied any chest pain on the office visit but he did have increasing dyspnea on exertion and lower extremity edema. Dr. Allyson Sabal was concerned of further mitral valve dysfunction and scheduled him for a TEE and the right and left heart catheterization. Patient presented to short stay and underwent elective TEE and cardiac catheterization.  Admitting labs were abnormal with a hemoglobin Of 8.1 and hematocrit of 24.7. Platelets were 148.  Patient was transfused and by the next morning hemoglobin had stabilized At 8.6 with hematocrit of 27.1.  GI consult with Dr. Elnoria Howard was obtained and plans are for outpatient EGD and colonoscopy.  Consult with cardiovascular surgeon Dr. ON was also obtained Dr. ON will see the patient in his office once the EGD and colonoscopy are completed.  Patient ambulated in the hallway and was stable and ready for discharge home on 05/01/2011 after Dr. Allyson Sabal saw him and felt he was stable.  Consults:  GI And TCTS  Significant Diagnostic Studies: 04/30/2011  TEE:  Severe MR due to ruptured chordae to the P2 scallop of the posterior leaflet. Preserved LV systolic function (EF60%). Moderate TR. Mild pulmonary HTN.   04/30/2011 right heart cath:  AO SYSTOLIC/AO DIASTOLIC: 151/63  RA pressure: 10/9  RV pressure: 13/12  Pulmonary artery pressure: 33/11 mean of 22  Pulmonary capillary. Wedge pressure:17/16, mean 14  Cardiac index by Fick was 4.1 L per minute per meter squared, and by thermodilution was 2.3 L per minute per meter squared   04/30/2011 left heart cath: Left main; normal  2. LAD; minor irregularities  3. Left circumflex; normal. There was a high marginal branch and the ramus distribution though small in caliber and had a focal 50% proximal stenosis.  4. Right coronary artery; 70% just after the takeoff of a high acute marginal branch  5. Abdominal aortogram: The thoracic abdominal aorta associated renal abdominal, aortoiliac medication were free of significant atherosclerotic changes   Discharge Exam: Blood pressure 149/65, pulse 66, temperature 97.7 F (36.5 C), temperature source Oral, resp. rate 18, height 6\' 1"  (1.854 m), weight 77.9 kg (171 lb 11.8 oz), SpO2 100.00%.   General appearance: alert, cooperative and no distress  Lungs: clear to auscultation bilaterally  Heart: regular rate and rhythm and 2/6 systolic murmur  Lt groin without hematoma   Disposition: HOME   Medication List  As of 05/01/2011  2:38 PM   TAKE these medications         acetaminophen 325 MG tablet   Commonly known as: TYLENOL   Take 2 tablets (650 mg total) by mouth every 4 (four) hours as  needed for pain.      allopurinol 100 MG tablet   Commonly known as: ZYLOPRIM   Take 100 mg by mouth daily.      aspirin EC 81 MG tablet   Take 81 mg by mouth daily.      atorvastatin 10 MG tablet   Commonly known as: LIPITOR   Take 10 mg by mouth daily.      diltiazem 300 MG 24 hr capsule    Commonly known as: TIAZAC   Take 300 mg by mouth daily.      ferrous sulfate 325 (65 FE) MG tablet   Take 1 tablet (325 mg total) by mouth daily with breakfast.      lisinopril 10 MG tablet   Commonly known as: PRINIVIL,ZESTRIL   Take 10 mg by mouth daily.      omega-3 acid ethyl esters 1 G capsule   Commonly known as: LOVAZA   Take 3 g by mouth daily.      omeprazole 20 MG capsule   Commonly known as: PRILOSEC   Take 20 mg by mouth every other day.           Follow-up Information    Follow up with Theda Belfast, MD. (for colonoscopy/EGD call his office for instructiions)    Contact information:   [address], [address] [place] [address] [phone]       Follow up with Purcell Nails, MD. (call his office for appt. after colonoscopy is complete for surgical arrangments. )    Contact information:   [address] [place] [address] [phone]       Follow up with Runell Gess, MD on 05/16/2011. (At 11:30 AM )    Contact information:   [address] [place] [address] [phone]        Discharge instructions: Heart healthy diet  Follow instructions from Dr. Haywood Pao office for colonoscopy/EGD.  Return to the hospital for blood in the stool, chest pain, and Shortness of breath. SignedLeone Brand 05/01/2011, 2:38 PM

## 2011-05-01 NOTE — Discharge Instructions (Signed)
Heart healthy diet  Follow instructions from Dr. Haywood Pao office for colonoscopy/EGD.  Return to the hospital for blood in the stool, chest pain, and Shortness of breath.

## 2011-05-02 LAB — TYPE AND SCREEN
ABO/RH(D): O NEG
Antibody Screen: NEGATIVE
Unit division: 0
Unit division: 0

## 2011-05-04 ENCOUNTER — Ambulatory Visit (HOSPITAL_COMMUNITY)
Admission: RE | Admit: 2011-05-04 | Discharge: 2011-05-04 | Disposition: A | Discharge status: Home / Self Care | Payer: Medicare Other | Source: Ambulatory Visit | Attending: Gastroenterology | Admitting: Gastroenterology

## 2011-05-04 ENCOUNTER — Encounter (HOSPITAL_COMMUNITY)
Admission: RE | Disposition: A | Discharge status: Home / Self Care | Payer: Self-pay | Source: Ambulatory Visit | Attending: Gastroenterology

## 2011-05-04 ENCOUNTER — Encounter (HOSPITAL_COMMUNITY): Payer: Self-pay | Admitting: Gastroenterology

## 2011-05-04 DIAGNOSIS — J449 Chronic obstructive pulmonary disease, unspecified: Secondary | ICD-10-CM | POA: Insufficient documentation

## 2011-05-04 DIAGNOSIS — K449 Diaphragmatic hernia without obstruction or gangrene: Secondary | ICD-10-CM | POA: Insufficient documentation

## 2011-05-04 DIAGNOSIS — K31819 Angiodysplasia of stomach and duodenum without bleeding: Secondary | ICD-10-CM | POA: Insufficient documentation

## 2011-05-04 DIAGNOSIS — K644 Residual hemorrhoidal skin tags: Secondary | ICD-10-CM | POA: Insufficient documentation

## 2011-05-04 DIAGNOSIS — D649 Anemia, unspecified: Secondary | ICD-10-CM | POA: Insufficient documentation

## 2011-05-04 DIAGNOSIS — J4489 Other specified chronic obstructive pulmonary disease: Secondary | ICD-10-CM | POA: Insufficient documentation

## 2011-05-04 DIAGNOSIS — I10 Essential (primary) hypertension: Secondary | ICD-10-CM | POA: Insufficient documentation

## 2011-05-04 DIAGNOSIS — K5520 Angiodysplasia of colon without hemorrhage: Secondary | ICD-10-CM | POA: Insufficient documentation

## 2011-05-04 DIAGNOSIS — K648 Other hemorrhoids: Secondary | ICD-10-CM | POA: Insufficient documentation

## 2011-05-04 DIAGNOSIS — Z79899 Other long term (current) drug therapy: Secondary | ICD-10-CM | POA: Insufficient documentation

## 2011-05-04 HISTORY — PX: COLONOSCOPY: SHX5424

## 2011-05-04 HISTORY — PX: ESOPHAGOGASTRODUODENOSCOPY: SHX5428

## 2011-05-04 SURGERY — COLONOSCOPY
Anesthesia: Moderate Sedation

## 2011-05-04 MED ORDER — MIDAZOLAM HCL 10 MG/2ML IJ SOLN
INTRAMUSCULAR | Status: DC | PRN
  Administered 2011-05-04 (×4): 1 mg via INTRAVENOUS
  Administered 2011-05-04: 2 mg via INTRAVENOUS

## 2011-05-04 MED ORDER — FENTANYL NICU IV SYRINGE 50 MCG/ML
INJECTION | INTRAMUSCULAR | Status: DC | PRN
  Administered 2011-05-04: 25 ug via INTRAVENOUS
  Administered 2011-05-04 (×4): 12.5 ug via INTRAVENOUS

## 2011-05-04 MED ORDER — DIPHENHYDRAMINE HCL 50 MG/ML IJ SOLN
INTRAMUSCULAR | Status: AC
  Filled 2011-05-04: qty 1

## 2011-05-04 MED ORDER — MIDAZOLAM HCL 10 MG/2ML IJ SOLN
INTRAMUSCULAR | Status: AC
  Filled 2011-05-04: qty 4

## 2011-05-04 MED ORDER — SODIUM CHLORIDE 0.9 % IV SOLN
Freq: Once | INTRAVENOUS | Status: AC
  Administered 2011-05-04: 14:00:00 via INTRAVENOUS

## 2011-05-04 MED ORDER — FENTANYL CITRATE 0.05 MG/ML IJ SOLN
INTRAMUSCULAR | Status: AC
  Filled 2011-05-04: qty 4

## 2011-05-04 NOTE — Discharge Instructions (Addendum)
Colonoscopy Care After Read the instructions outlined below and refer to this sheet in the next few weeks. These discharge instructions provide you with general information on caring for yourself after you leave the hospital. Your doctor may also give you specific instructions. While your treatment has been planned according to the most current medical practices available, unavoidable complications occasionally occur. If you have any problems or questions after discharge, call your doctor. HOME CARE INSTRUCTIONS ACTIVITY:  You may resume your regular activity, but move at a slower pace for the next 24 hours.   Take frequent rest periods for the next 24 hours.   Walking will help get rid of the air and reduce the bloated feeling in your belly (abdomen).   No driving for 24 hours (because of the medicine (anesthesia) used during the test).   You may shower.   Do not sign any important legal documents or operate any machinery for 24 hours (because of the anesthesia used during the test).  NUTRITION:  Drink plenty of fluids.   You may resume your normal diet as instructed by your doctor.   Begin with a light meal and progress to your normal diet. Heavy or fried foods are harder to digest and may make you feel sick to your stomach (nauseated).   Avoid alcoholic beverages for 24 hours or as instructed.  MEDICATIONS:  You may resume your normal medications unless your doctor tells you otherwise.  WHAT TO EXPECT TODAY:  Some feelings of bloating in the abdomen.   Passage of more gas than usual.   Spotting of blood in your stool or on the toilet paper.  IF YOU HAD POLYPS REMOVED DURING THE COLONOSCOPY:  No aspirin products for 7 days or as instructed.   No alcohol for 7 days or as instructed.   Eat a soft diet for the next 24 hours.  FINDING OUT THE RESULTS OF YOUR TEST Not all test results are available during your visit. If your test results are not back during the visit, make an  appointment with your caregiver to find out the results. Do not assume everything is normal if you have not heard from your caregiver or the medical facility. It is important for you to follow up on all of your test results.  SEEK IMMEDIATE MEDICAL CARE IF:  You have more than a spotting of blood in your stool.   Your belly is swollen (abdominal distention).   You are nauseated or vomiting.   You have a fever.   You have abdominal pain or discomfort that is severe or gets worse throughout the day.  Document Released: 08/02/2003 Document Revised: 12/07/2010 Document Reviewed: 07/31/2007 [facility] Patient Information 2012 [place], [place].Endoscopy Care After Please read the instructions outlined below and refer to this sheet in the next few weeks. These discharge instructions provide you with general information on caring for yourself after you leave the hospital. Your doctor may also give you specific instructions. While your treatment has been planned according to the most current medical practices available, unavoidable complications occasionally occur. If you have any problems or questions after discharge, please call your doctor. HOME CARE INSTRUCTIONS Activity  You may resume your regular activity but move at a slower pace for the next 24 hours.   Take frequent rest periods for the next 24 hours.   Walking will help expel (get rid of) the air and reduce the bloated feeling in your abdomen.   No driving for 24 hours (because of the  anesthesia (medicine) used during the test).   You may shower.   Do not sign any important legal documents or operate any machinery for 24 hours (because of the anesthesia used during the test).  Nutrition  Drink plenty of fluids.   You may resume your normal diet.   Begin with a light meal and progress to your normal diet.   Avoid alcoholic beverages for 24 hours or as instructed by your caregiver.  Medications You may resume your normal  medications unless your caregiver tells you otherwise. What you can expect today  You may experience abdominal discomfort such as a feeling of fullness or "gas" pains.   You may experience a sore throat for 2 to 3 days. This is normal. Gargling with salt water may help this.  Follow-up Your doctor will discuss the results of your test with you. SEEK IMMEDIATE MEDICAL CARE IF:  You have excessive nausea (feeling sick to your stomach) and/or vomiting.   You have severe abdominal pain and distention (swelling).   You have trouble swallowing.   You have a temperature over 100 F (37.8 C).   You have rectal bleeding or vomiting of blood.  Document Released: 08/02/2003 Document Revised: 12/07/2010 Document Reviewed: 02/12/2007 [facility] Patient Information 2012 [place], [place].

## 2011-05-04 NOTE — H&P (View-Only) (Signed)
Reason for Consult: Anemia and history of AVMs Referring Physician: Jeri Cos, M.D.  Denna Haggard HPI: Tyler is an 78 year old [name] who is well-known to me with a history of duodenal and proximal colonic AVMs.  The [name] was evaluated in the past for an anemia and the procedures identified the lesions.  His EGD revealed a nonbleeding duodenal AVM and the colon exhibited two large cecal and ascending colon AVMs (11/2009).  The lesions were ablated and his HGB increased over a 6 month period with iron supplementation.  His last HGB check in the office was on 07/2010 with a value of 13.4 g/dL.  He had another check by another physician in 11/2010 with a value of 13.1 g/dL.  From that time point until the time of his admission his HGB dropped down to the 7 range.  He is heme negative and his cardiac cath only revealed mild CAD.  He has a history of severe MR.  As a result of his anemia, GI was consulted.  No reports of melena, hematochezia, diarrhea, or constipation.  Past Medical History  Diagnosis Date  . Coronary artery disease   . Hypertension   . Heart murmur   . COPD (chronic obstructive pulmonary disease)   . Shortness of breath   . Arthritis   . PONV (postoperative nausea and vomiting)     Past Surgical History  Procedure Date  . Cardiac catheterization   . Lobectomy 1963  . Knee surgery 2005  . Hernia repair 1999&2000  . Tee without cardioversion 04/30/2011    Procedure: TRANSESOPHAGEAL ECHOCARDIOGRAM (TEE);  Surgeon: Thurmon Fair, MD;  Location: [facility] ENDOSCOPY;  Service: Cardiovascular;  Laterality: N/A;  3D TEE/ [name] will be admitted the day before test , therefo [name] will be a inpatient the day of TEE    History reviewed. No pertinent family history.  Social History:  reports that he quit smoking about 50 years ago. He quit smokeless tobacco use about 50 years ago. He reports that he drinks about .6 ounces of alcohol per week. He reports that he does not use  illicit drugs.  Allergies: No Known Allergies  Medications:  Scheduled:   . allopurinol  100 mg Oral Daily  . aspirin  81 mg Oral Daily  . atorvastatin  10 mg Oral q1800  . diltiazem  300 mg Oral Daily  . pantoprazole  40 mg Oral Q0600  . DISCONTD: allopurinol (ZYLOPRIM) IVPB  100 mg Intravenous Daily  . DISCONTD: aspirin  81 mg Oral Daily  . DISCONTD: aspirin EC  325 mg Oral Daily  . DISCONTD: fentaNYL  250 mcg Intravenous Once  . DISCONTD: midazolam  10 mg Intravenous Once  . DISCONTD: sodium chloride  3 mL Intravenous Q12H   Continuous:   . sodium chloride 75 mL/hr at 04/30/11 1130  . DISCONTD: sodium chloride    . DISCONTD: sodium chloride 75 mL/hr at 04/30/11 1478    Results for orders placed during the hospital encounter of 04/29/11 (from the past 24 hour(s))  OCCULT BLOOD X 1 CARD TO LAB, STOOL     Status: Normal   Collection Time   04/30/11  9:05 PM      Component Value Range   Fecal Occult Bld NEGATIVE    HEMOGLOBIN AND HEMATOCRIT, BLOOD     Status: Abnormal   Collection Time   05/01/11  9:11 AM      Component Value Range   Hemoglobin 8.6 (*) 13.0 - 17.0 (g/dL)  HCT 27.1 (*) 39.0 - 52.0 (%)  BASIC METABOLIC PANEL     Status: Abnormal   Collection Time   05/01/11  9:11 AM      Component Value Range   Sodium 138  135 - 145 (mEq/L)   Potassium 4.4  3.5 - 5.1 (mEq/L)   Chloride 104  96 - 112 (mEq/L)   CO2 20  19 - 32 (mEq/L)   Glucose, Bld 199 (*) 70 - 99 (mg/dL)   BUN 28 (*) 6 - 23 (mg/dL)   Creatinine, Ser 1.61 (*) 0.50 - 1.35 (mg/dL)   Calcium 9.4  8.4 - 09.6 (mg/dL)   GFR calc non Af Amer 37 (*) >90 (mL/min)   GFR calc Af Amer 43 (*) >90 (mL/min)  PREPARE RBC (CROSSMATCH)     Status: Normal   Collection Time   05/01/11  9:20 AM      Component Value Range   Order Confirmation ORDER PROCESSED BY BLOOD BANK    TYPE AND SCREEN     Status: Normal (Preliminary result)   Collection Time   05/01/11  9:20 AM      Component Value Range   ABO/RH(D) O NEG      Antibody Screen NEG     Sample Expiration 05/04/2011     Unit Number [id number]     Blood Component Type RBC CPDA1, LR     Unit division 00     Status of Unit ALLOCATED     Transfusion Status OK TO TRANSFUSE     Crossmatch Result Compatible     Unit Number [id number]     Blood Component Type RED CELLS,LR     Unit division 00     Status of Unit ALLOCATED     Transfusion Status OK TO TRANSFUSE     Crossmatch Result Compatible    ABO/RH     Status: Normal   Collection Time   05/01/11  9:20 AM      Component Value Range   ABO/RH(D) O NEG       No results found.  ROS:  As stated above in the HPI otherwise negative.  Blood pressure 149/65, pulse 66, temperature 97.7 F (36.5 C), temperature source Oral, resp. rate 18, height 6\' 1"  (1.854 m), weight 77.9 kg (171 lb 11.8 oz), SpO2 100.00%.    PE: Gen: NAD, Alert and Oriented HEENT:  [place]/AT, EOMI Neck: Supple, no LAD Lungs: CTA Bilaterally CV: RRR ABM: Soft, NTND, +BS Ext: No C/C/E  Assessment/Plan: 1) Recurrent anemia. 2) History of AVMs. 3) CAD and MR.   The [name] is stable from the cardiac standpoint.  He is s/p 2 units of PRBC and current HGB is at 8.6 g/dL.  No complaints of any chest pain or SOB.  I offered to perform the EGD/Colonoscopy tomorrow, but he would like to go home first.  From a GI standpoint it is not an unreasonable request and I spoke with Dr. Allyson Sabal who feels that he can go home.  I will have him undergo the procedures Tyler coming Friday.  Plan: 1) Outpatient EGD/Colonoscopy Tyler Friday.  I will make the arrangements. 2) He knows to return to the hospital if his condition changes, i.e., hematochezia, melena, chest pain, or SOB.  Krystel Fletchall D 05/01/2011, 12:47 PM

## 2011-05-04 NOTE — Op Note (Signed)
[facility] [address] [place], [place]  [address]  OPERATIVE PROCEDURE REPORT  PATIENT:  Shannan, Garfinkel  MR#:  [id number] BIRTHDATE:  1925-05-10  GENDER:  male ENDOSCOPIST:  Jeani Hawking, MD ASSISTANT:  Rolanda Lundborg and Angelique Blonder PROCEDURE DATE:  05/04/2011 PROCEDURE:  EGD with lesion ablation ASA CLASS:  Class III INDICATIONS:  Anemia MEDICATIONS:  Fentanyl 67.5 mcg IV, Versed 5 mg IV  DESCRIPTION OF PROCEDURE:   After the risks benefits and alternatives of the procedure were thoroughly explained, informed consent was obtained.  The  endoscope was introduced through the mouth and advanced to the second portion of the duodenum, without limitations.  The instrument was slowly withdrawn as the mucosa was fully examined. <<PROCEDUREIMAGES>>  FINDINGS: A 3 cm hiatal hernia was identified. No gastric abnormalities. In the second portion of the duodenum a small nonbleeding AVM was identified and ablated with APC. No other lesions or any evidence of bleeding noted.    Retroflexed views revealed no abnormalities.    The scope was then withdrawn from the patient and the procedure terminated.  COMPLICATIONS:  None  IMPRESSION:  1) D2 AVM. 2) Hiatal hernia. RECOMMENDATIONS:  1) Proceed with the colonoscopy.  ______________________________ Jeani Hawking, MD  n. Rosalie DoctorJeani Hawking at 05/04/2011 02:31 PM  Deneise Lever, [phone]

## 2011-05-04 NOTE — Interval H&P Note (Signed)
History and Physical Interval Note:  05/04/2011 12:13 PM  [name]  has presented today for surgery, with the diagnosis of IDA  The various methods of treatment have been discussed with the patient and family. After consideration of risks, benefits and other options for treatment, the patient has consented to  Procedure(s) (LRB): COLONOSCOPY (N/A) ESOPHAGOGASTRODUODENOSCOPY (EGD) (N/A) as a surgical intervention .  The patients' history has been reviewed, patient examined, no change in status, stable for surgery.  I have reviewed the patients' chart and labs.  Questions were answered to the patient's satisfaction.     Lisa Blakeman D

## 2011-05-04 NOTE — Op Note (Signed)
[facility] [address] [place], [place]  [address]  OPERATIVE PROCEDURE REPORT  PATIENT:  [name], [name]  MR#:  [id number] BIRTHDATE:  [birth year]  GENDER:  male ENDOSCOPIST:  Jeani Hawking, MD PROCEDURE DATE:  05/04/2011 PROCEDURE:  Colonoscopy with ablation ASA CLASS:  Class III INDICATIONS:  Anemia MEDICATIONS:  Fentanyl 0 mcg IV, Versed 1 mg IV  DESCRIPTION OF PROCEDURE:   After the risks benefits and alternatives of the procedure were thoroughly explained, informed consent was obtained.  Digital rectal exam was performed and revealed no abnormalities.   The  endoscope was introduced through the anus and advanced to the terminal ileum which was intubated for a short distance, without limitations.  The quality of the prep was good..  The instrument was then slowly withdrawn as the colon was fully examined. <<PROCEDUREIMAGES>>  FINDINGS:  The cecum was poorly prepped, but with extensive washing excellent views were obtained. With carefule and repeated examination of the cecum two definitely small nonbleeding AVMs were identified. Two suspicious sites for AVMs in the cecum were also identified and all lesions were treated with APC. In the proximal transverse colon a probable large nonbleeding AVM was identified and ablated with APC. No other abnormalities noted. Retroflexed views in the rectum revealed internal and external hemorrhoids.    The scope was then withdrawn from the patient and the procedure terminated.  COMPLICATIONS:  None  IMPRESSION:  1) AVM's 2) Internal and external hemorrhoids RECOMMENDATIONS:  1) Follow up in the office in one month and check HGB. 2) Iron supplementation. ______________________________ Jeani Hawking, MD  n. Rosalie DoctorJeani Hawking at 05/04/2011 03:18 PM  Deneise Lever, [phone]

## 2011-05-07 ENCOUNTER — Encounter (HOSPITAL_COMMUNITY): Payer: Self-pay | Admitting: Gastroenterology

## 2011-05-11 ENCOUNTER — Encounter: Payer: Self-pay | Admitting: *Deleted

## 2011-05-11 DIAGNOSIS — E785 Hyperlipidemia, unspecified: Secondary | ICD-10-CM | POA: Insufficient documentation

## 2011-05-14 ENCOUNTER — Encounter: Payer: Self-pay | Admitting: Thoracic Surgery (Cardiothoracic Vascular Surgery)

## 2011-05-14 ENCOUNTER — Institutional Professional Consult (permissible substitution) (INDEPENDENT_AMBULATORY_CARE_PROVIDER_SITE_OTHER): Payer: Medicare Other | Admitting: Thoracic Surgery (Cardiothoracic Vascular Surgery)

## 2011-05-14 VITALS — BP 174/64 | HR 78 | Resp 18 | Ht 73.0 in | Wt 165.0 lb

## 2011-05-14 DIAGNOSIS — I059 Rheumatic mitral valve disease, unspecified: Secondary | ICD-10-CM

## 2011-05-14 DIAGNOSIS — I340 Nonrheumatic mitral (valve) insufficiency: Secondary | ICD-10-CM

## 2011-05-14 DIAGNOSIS — I2510 Atherosclerotic heart disease of native coronary artery without angina pectoris: Secondary | ICD-10-CM

## 2011-05-14 NOTE — Progress Notes (Signed)
[address]            [name] [address]          [phone]     CARDIOTHORACIC SURGERY CONSULTATION REPORT  Referring Provider is Runell Gess, MD PCP is Georgianne Fick, MD, MD  Chief Complaint  Patient presents with  . Mitral Regurgitation    Referral from Dr Allyson Sabal eval Mitral Regurgitation, Cardiac Cath on 04/29/11     HPI:  Patient is an 78 year old gentleman from [place] with mitral valve prolapse and severe mitral regurgitation with been followed for the last several years by Dr. Allyson Sabal. The patient originally was referred for cardiac evaluation several years ago when he was noted to have irregular heart rhythm  Characterized as frequent premature ventricular contractions. He underwent a stress test and echocardiogram and was noted to have mitral valve prolapse and mitral regurgitation. He has been followed medically since then and remained essentially asymptomatic. Approximately 2 months ago the patient began to experience considerable increase symptoms of exertional shortness of breath, fatigue, and lower extremity edema. Symptoms progressed to the point where he became short of breath with minimal activity. He was scheduled for transesophageal echocardiogram with left and right heart catheterization. He was brought in to the hospital prior to these procedures do to the presence of known mild chronic renal insufficiency. He was found to have severe anemia at that time and he was transfused packed red blood cells. He has known history of colonic AV malformations and he was seen in consultation by Dr. Elnoria Howard. He underwent transesophageal echocardiogram and left and right heart catheterization. Echocardiogram demonstrated the presence of mitral valve prolapse with flail segment of the posterior leaflet and severe mitral regurgitation. Cardiac catheterization was notable for the presence of single-vessel coronary artery disease. He was discharged  from the hospital and since then has undergone EGD and colonoscopy. He apparently again was noted to have colonic AV malformations these were cauterized. The patient now returns to undergo elective surgical consultation to discuss management options for is mitral valve prolapse and coronary artery disease.  He states that over the past 2 weeks he has been feeling better. He has not been exerting himself physically, but his breathing is better than it was prior to receiving blood transfusion. He still has mild exertional shortness of breath. Lower extremity edema has resolved. He has not noticed any blood in his stool.  Past Medical History  Diagnosis Date  . Coronary artery disease   . Hypertension   . Heart murmur   . COPD (chronic obstructive pulmonary disease)   . Shortness of breath   . Arthritis   . PONV (postoperative nausea and vomiting)   . Hyperlipidemia   . Mitral regurgitation   . AV (angiodysplasia malformation of colon)   . Gout   . Benign prostatic hypertrophy   . Mitral valve prolapse     Past Surgical History  Procedure Date  . Cardiac catheterization   . Lobectomy 1980    left lower lobectomy for benign tumor  . Replacement total knee 2005    right  . Hernia repair 1999&2000    bilateral  . Tee without cardioversion 04/30/2011    Procedure: TRANSESOPHAGEAL ECHOCARDIOGRAM (TEE);  Surgeon: Thurmon Fair, MD;  Location: [facility] ENDOSCOPY;  Service: Cardiovascular;  Laterality: N/A;  3D TEE/ patient will be admitted the day before test , therefo patient will  be a inpatient the day of TEE  . Colonoscopy 05/04/2011    Procedure: COLONOSCOPY;  Surgeon: Theda Belfast, MD;  Location: [facility] ENDOSCOPY;  Service: Endoscopy;  Laterality: N/A;  . Esophagogastroduodenoscopy 05/04/2011    Procedure: ESOPHAGOGASTRODUODENOSCOPY (EGD);  Surgeon: Theda Belfast, MD;  Location: [facility] ENDOSCOPY;  Service: Endoscopy;  Laterality: N/A;  . Cataract extraction 2011    bilateral    No family history on  file.  Social History History  Substance Use Topics  . Smoking status: Former Smoker -- 20 years    Quit date: 02/08/1961  . Smokeless tobacco: Former Neurosurgeon    Quit date: 02/08/1961  . Alcohol Use: 0.0 oz/week     4ozs Scotch daily    Current Outpatient Prescriptions  Medication Sig Dispense Refill  . acetaminophen (TYLENOL) 325 MG tablet Take 2 tablets (650 mg total) by mouth every 4 (four) hours as needed for pain.      [name] allopurinol (ZYLOPRIM) 100 MG tablet Take 100 mg by mouth daily.      [name] aspirin EC 81 MG tablet Take 81 mg by mouth daily.      [name] atorvastatin (LIPITOR) 10 MG tablet Take 10 mg by mouth daily.      [name] diltiazem (TIAZAC) 300 MG 24 hr capsule Take 300 mg by mouth daily.      . ferrous sulfate (FERROUSUL) 325 (65 FE) MG tablet Take 1 tablet (325 mg total) by mouth daily with breakfast.  30 tablet  2  . lisinopril (PRINIVIL,ZESTRIL) 10 MG tablet Take 10 mg by mouth daily.      . Omega-3 Fatty Acids (FISH OIL) 1200 MG CAPS Take by mouth 3 x daily with food.      [name] omeprazole (PRILOSEC) 20 MG capsule Take 20 mg by mouth every other day.        No Known Allergies  Review of Systems:  General:  normal appetite, normal energy   Respiratory:  no cough, no wheezing, no hemoptysis, no pain with inspiration or cough, no shortness of breath  Cardiac:   no chest pain or tightness, + exertional SOB, no resting SOB, no PND, no orthopnea, + LE edema, no palpitations, no syncope  GI:   no difficulty swallowing, no hematochezia, no hematemesis, no melena, no constipation, no diarrhea, reflux controlled with Prilosec  GU:   no dysuria, no urgency, + frequency   Musculoskeletal: + arthritis particularly in left knee, no arthralgia   Vascular:  no pain suggestive of claudication   Neuro:   no symptoms suggestive of TIA's, no seizures, no headaches, no peripheral neuropathy   Endocrine:  Negative   HEENT:  no loose teeth or painful teeth,  no recent vision changes  Psych:   no  anxiety, no depression    Physical Exam:   BP 174/64  Pulse 78  Resp 18  Ht 6\' 1"  (1.854 m)  Wt 165 lb (74.844 kg)  BMI 21.77 kg/m2  SpO2 99%  General:    well-appearing  HEENT:  Unremarkable   Neck:   no JVD, no bruits, no adenopathy   Chest:   clear to auscultation, symmetrical breath sounds, no wheezes, no rhonchi   CV:   RRR, grade III/VI holosystolic murmur   Abdomen:  soft, non-tender, no masses   Extremities:  warm, well-perfused, pulses palpable but slightly diminished, no LE edema  Rectal/GU  Deferred  Neuro:   Grossly non-focal and symmetrical throughout  Skin:   Clean and dry, no rashes,  no breakdown   Diagnostic Tests:  Transesophageal Echocardiography  Patient: [name], [name] MR #: [id number] Study Date: 04/30/2011 Gender: M Age: 30 Height: 185.4cm Weight: 78.2kg BSA: 2.52m^2 Pt. Status: Room: [facility]  ------------------------------------------------------------ LV EF: 60% - 65% ------------------------------------------------------------ Indications: Mitral regurgitation 424.0. ------------------------------------------------------------ Study Conclusions  - Left ventricle: The cavity size was normal. Wall thickness was normal. Systolic function was normal. The estimated ejection fraction was in the range of 60% to 65%. - Aortic valve: No evidence of vegetation. Trivial regurgitation. - Mitral valve: Mildly dilated, noncalcified annulus. Mildly thickened leaflets . Moderate myxomatous degeneration. Severe flail motion involving the middle scallop of the posterior leaflet due to rupture of one or more chords. The ruptured chord(s) are located between the center of the P2 (middle) scallop and the P2-P1 subcommisure. Severe regurgitation directed eccentrically and anteriorly. Severe regurgitation is suggested by an effective regurgitant orifice = 0.40 cm^2. - Left atrium: The atrium was mildly dilated. No evidence of thrombus in the atrial cavity  or appendage. The appendage was morphologically a left appendage and dilated. Emptying velocity was normal. - Right atrium: No evidence of thrombus in the atrial cavity or appendage. - Atrial septum: No defect or patent foramen ovale was identified. - Tricuspid valve: Mild-moderate regurgitation directed centrally. - Pulmonary arteries: Systolic pressure was mildly increased. PA peak pressure: 40mm Hg (S). ----------------------------------------- Left ventricle: The cavity size was normal. Wall thickness was normal. Systolic function was normal. The estimated ejection fraction was in the range of 60% to 65%. ------------------------------------------------------------ Aortic valve: Structurally normal valve. Cusp separation was normal. No evidence of vegetation. Doppler: Trivial regurgitation. ------------------------------------------------------------ Aorta: The aorta was normal, not dilated, and non-diseased. Descending aorta: The descending aorta had minor luminal irregularities. ------------------------------------------------------------ Mitral valve: Mildly dilated, noncalcified annulus. Mildly thickened leaflets . Moderate myxomatous degeneration. Severe flail motion involving the middle scallop of the posterior leaflet due to rupture of one or more chords. The ruptured chord(s) are located between the center of the P2 (middle) scallop and the P2-P1 subcommisure. Doppler: Severe regurgitation directed eccentrically and anteriorly. Severe regurgitation is suggested by an effective regurgitant orifice = 0.40 cm^2. Mitral annulus diameter: M-L 33.6 mm, A-P 31.6 mm. ------------------------------------------------------------ Left atrium: The atrium was mildly dilated. No evidence of thrombus in the atrial cavity or appendage. The appendage was morphologically a left appendage and dilated. Emptying velocity was  normal. ------------------------------------------------------------ Atrial septum: No defect or patent foramen ovale was identified. ------------------------------------------------------------ Right ventricle: The cavity size was normal. Wall thickness was normal. Systolic function was normal. ------------------------------------------------------------ Pulmonic valve: Structurally normal valve. Cusp separation was normal. No evidence of vegetation. Doppler: Mild regurgitation. ------------------------------------------------------------ Tricuspid valve: Structurally normal valve. Leaflet separation was normal. No evidence of vegetation. Doppler: Mild-moderate regurgitation directed centrally. ------------------------------------------------------------ Pulmonary artery: Systolic pressure was mildly increased. ------------------------------------------------------------ Right atrium: The atrium was normal in size. No evidence of thrombus in the atrial cavity or appendage. ------------------------------------------------------------ Post procedure conclusions Ascending Aorta - The aorta was normal, not dilated, and non-diseased. ------------------------------------------------------------ 2D measurements Normal Doppler measurements Normal Mitral valve Main pulmonary Annulus AP 31.6 mm ------ artery diam Pressure, S 40 mm =30 Hg Mitral valve Regurg alias 38.5 cm/s ------ vel, PISA Max regurg 563 cm/s ------ vel Regurg VTI 159 cm ------ ERO, PISA 0.52 cm^2 ------ Regurg vol, 83 ml ------ PISA ------------------------------------------------------------ Prepared and Electronically Authenticated by  [employer], [place]     [facility]  CARDIAC CATHETERIZATION   HEMODYNAMICS:  AO SYSTOLIC/AO DIASTOLIC: 151/63  RA pressure: 10/9  RV pressure: 13/12  Pulmonary artery pressure: 33/11 mean of 22  Pulmonary capillary. Wedge pressure:17/16, mean 14   Cardiac index by Fick was 4.1 L per minute per meter squared, and by thermodilution was 2.3 L per minute per meter squared  ANGIOGRAPHIC RESULTS:  1. Left main; normal  2. LAD; minor irregularities  3. Left circumflex; normal. There was a high marginal branch and the ramus distribution though small in caliber and had a focal 50% proximal stenosis.  4. Right coronary artery; 70% just after the takeoff of a high acute marginal branch  5. Abdominal aortogram: The thoracic abdominal aorta associated renal abdominal, aortoiliac medication were free of significant atherosclerotic changes  IMPRESSION:Mr. Farabee has severe MR by Three-D TEE and normal LV function with essentially one-vessel CAD involving the right coronary artery. He does have moderate renal insufficiency I will be hydrated. He is also significantly anemic and will be transfused 2 units of packed red blood cells. Dr. Jeani Hawking, gastroenterologist, who has seen the patient in the past for gastric AVMs will be consulted.  Runell Gess MD, [facility]  04/30/2011  11:37 AM       Impression:  Patient has mitral valve prolapse with severe mitral regurgitation. Left ventricular systolic function is normal. There is mild pulmonary hypertension. The patient also has single-vessel coronary artery disease with 70% stenosis of the proximal right coronary artery.  The patient recently developed significant acceleration of symptoms of shortness of breath with minimal activity, but these may have been exacerbated by the presence of severe anemia and his symptoms seem to be somewhat improved since receiving blood transfusion. Options include surgical intervention for mitral valve repair with concomitant coronary artery bypass grafting versus surgical intervention with minimally invasive mitral valve repair and PCI of the right coronary artery versus continued medical therapy.  Risks associated with surgical intervention will be slightly increased do to  the patient's advanced age, although overall he appears to be an excellent candidate for surgery.   Plan:  I discussed options at length with the patient and his wife here in the office today. The rationale for early surgical intervention for patient's with mitral valve prolapse and severe mitral regurgitation associated with a flail leaflet had been discussed. This is been put in context with the patient's advanced age and other comorbid medical problems. The rationale for elective mitral valve repair surgery has been explained, including a comparison between surgery and continued medical therapy with close follow-up.  The likelihood of successful and durable valve repair has been discussed with particular reference to the findings of their recent echocardiogram.  Based upon these findings and previous experience, I have quoted them a greater than 98 percent likelihood of successful valve repair.  Options for management of the coexistent coronary artery disease have also been discussed. Alternative surgical approaches have been discussed, including a comparison between conventional sternotomy and minimally-invasive techniques.  The relative risks and benefits of each have been reviewed as they pertain to the patient's specific circumstances, and all of their questions have been addressed. He desires to think matters over further before making a final decision.    Salvatore Decent. Cornelius Moras, MD 05/14/2011 11:08 AM

## 2012-02-01 ENCOUNTER — Encounter (HOSPITAL_COMMUNITY): Payer: Self-pay | Admitting: Pharmacy Technician

## 2012-02-06 ENCOUNTER — Other Ambulatory Visit: Payer: Self-pay | Admitting: Orthopedic Surgery

## 2012-02-11 ENCOUNTER — Encounter (HOSPITAL_COMMUNITY): Payer: Self-pay

## 2012-02-11 ENCOUNTER — Encounter (HOSPITAL_COMMUNITY)
Admission: RE | Admit: 2012-02-11 | Discharge: 2012-02-11 | Disposition: A | Discharge status: Home / Self Care | Payer: Medicare Other | Source: Ambulatory Visit | Attending: Orthopedic Surgery | Admitting: Orthopedic Surgery

## 2012-02-11 HISTORY — DX: Anemia, unspecified: D64.9

## 2012-02-11 HISTORY — DX: Chronic kidney disease, unspecified: N18.9

## 2012-02-11 LAB — CBC WITH DIFFERENTIAL/PLATELET
Hemoglobin: 11.3 g/dL — ABNORMAL LOW (ref 13.0–17.0)
Lymphocytes Relative: 21 % (ref 12–46)
Lymphs Abs: 1.6 10*3/uL (ref 0.7–4.0)
Monocytes Relative: 12 % (ref 3–12)
Neutro Abs: 4.6 10*3/uL (ref 1.7–7.7)
Neutrophils Relative %: 62 % (ref 43–77)
RBC: 3.41 MIL/uL — ABNORMAL LOW (ref 4.22–5.81)

## 2012-02-11 LAB — COMPREHENSIVE METABOLIC PANEL
Albumin: 3.9 g/dL (ref 3.5–5.2)
Alkaline Phosphatase: 83 U/L (ref 39–117)
BUN: 39 mg/dL — ABNORMAL HIGH (ref 6–23)
CO2: 22 mEq/L (ref 19–32)
Chloride: 99 mEq/L (ref 96–112)
GFR calc Af Amer: 40 mL/min — ABNORMAL LOW (ref >90)
Glucose, Bld: 97 mg/dL (ref 70–99)
Potassium: 4.3 mEq/L (ref 3.5–5.1)
Total Bilirubin: 0.4 mg/dL (ref 0.3–1.2)

## 2012-02-11 LAB — TYPE AND SCREEN

## 2012-02-11 LAB — URINALYSIS, ROUTINE W REFLEX MICROSCOPIC
Bilirubin Urine: NEGATIVE
Nitrite: NEGATIVE
Specific Gravity, Urine: 1.011 (ref 1.005–1.030)
pH: 5 (ref 5.0–8.0)

## 2012-02-11 LAB — PROTIME-INR: Prothrombin Time: 13.6 seconds (ref 11.6–15.2)

## 2012-02-11 LAB — APTT: aPTT: 29 seconds (ref 24–37)

## 2012-02-11 LAB — SURGICAL PCR SCREEN: Staphylococcus aureus: NEGATIVE

## 2012-02-11 LAB — URINE MICROSCOPIC-ADD ON

## 2012-02-11 NOTE — Consult Note (Addendum)
Anesthesia Consult:  Patient is a 79 year old male scheduled for left TKA by Dr. Luiz Blare on 02/15/12.  Dr. Luiz Blare requested an anesthesia consult because patient is interested in having spinal anesthesia (instead of general anesthesia) for this procedure.  He is open to both options of anesthesia, but had a lot of nausea following his TKA in 2005 with Dr. Renae Fickle.  Also his sister-in-law had four different knee procedures (two of which were done with a spinal), and her preferred method was a spinal.  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 Elnoria Howard), BPH, CKD stage III, anemia, gout.  He was initially going to see CT Surgeon Dr. Cornelius Moras in May 2013 for minimally invasive MVR following RCA PCI however, he was also undergoing a work-up for anemia at that time.  Patient's anemia improved after ablation of his AVMs and his dyspnea also improved.  Subsequently, Cardiologist Dr. Allyson Sabal felt both interventions could be safely delayed. Reportedly, Dr. Allyson Sabal cleared patient for this procedure (records requested).  PCP is Dr. Nicholos Johns.  Nephrologist is Dr. Caryn Section.  Meds include acetaminophen, allopurinol, ASA 81 mg, atorvastatin, colchicine, diltiazem, ferrous sulfate, omega 3 fatty acids, omeprazole.  Patient was unsure of when to hold his ASA and fish oil capsules.  I spoke with Darl Pikes at Dr. Luiz Blare office. They will get in touch with patient with further instructions.  2D Echo on 10/11/11: [facility]) showed normal left ventricular systolic function, EF greater than 55%, mild bileaflet mitral valve prolapse, moderate to moderately severe eccentric mitral regurgitation.  Flail or ruptured chordae to P2 scallop as seen on TEE from 04/2011 not seen on this study. Left atrium is severely dilated. Right atrium is moderately dilated. Moderate tricuspid regurgitation. RV SP  elevated at 30 mmHg. Trace aortic regurgitation. No aortic stenosis.  04/30/2011 TEE:  Severe MR due to ruptured chordae to the P2 scallop of the posterior leaflet. Preserved LV systolic function (EF60%). Moderate TR. Mild pulmonary HTN.   04/30/2011 right heart cath:  AO SYSTOLIC/AO DIASTOLIC: 151/63  RA pressure: 10/9  RV pressure: 13/12  Pulmonary artery pressure: 33/11 mean of 22  Pulmonary capillary. Wedge pressure:17/16, mean 14  Cardiac index by Fick was 4.1 L per minute per meter squared, and by thermodilution was 2.3 L per minute per meter squared.   04/30/2011 left heart cath:  1. Left main; normal.  2. LAD; minor irregularities.  3. Left circumflex; normal. There was a high marginal branch and the ramus distribution though small in caliber and had a focal 50% proximal stenosis.  4. Right coronary artery; 70% just after the takeoff of a high acute marginal branch.  5. Abdominal aortogram: The thoracic abdominal aorta associated renal abdominal, aortoiliac medication were free of significant atherosclerotic changes.  EKG on 04/29/11 showed SR with first degree AVB.  CXR on 04/24/11 showed no acute cardiopulmonary disease.  Pre-operative labs noted.  Cr 1.72 (previous labs in Epic show a Cr of 1.64 - 1.78).  H/H 11.3/32.2.  He reports his last hgb in January 2014 was ~ 12.0.  He denies any obvious known blood loss, new SOB or fatigue.  He did request that I fax these labs to Dr. Elnoria Howard, which I did.     I evaluated him during his PAT visit on 02/11/12.  I think he would be a candidate for a spinal if he  wishes.  (I reviewed with Anesthesiologist Dr. Jacklynn Bue who agreed.)  We did discuss some of the pros and cons of spinal versus general.  He will discuss his options further with his anesthesiologist on the day of surgery and a definitive anesthesia plan will be determined at that time.  Shonna Chock, PA-C 02/12/12 1306

## 2012-02-11 NOTE — Pre-Procedure Instructions (Signed)
[name]  02/11/2012   Your procedure is scheduled on:  Feb 15, 2012  Report to [facility] at 9:15 AM.  Call this number if you have problems the morning of surgery: [phone]   Remember:   Do not eat food or drink liquids after midnight.   Take these medicines the morning of surgery with A SIP OF WATER: tylenol as needed, prilosec, diltiazem   Do not wear jewelry, make-up or nail polish.  Do not wear lotions, powders, or perfumes. You may wear deodorant.  Do not shave 48 hours prior to surgery. Men may shave face and neck.  Do not bring valuables to the hospital.  Contacts, dentures or bridgework may not be worn into surgery.  Leave suitcase in the car. After surgery it may be brought to your room.  For patients admitted to the hospital, checkout time is 11:00 AM the day of  discharge.   Patients discharged the day of surgery will not be allowed to drive  home.  Name and phone number of your driver:   Special Instructions: Incentive Spirometry - Practice and bring it with you on the day of surgery. Shower using CHG 2 nights before surgery and the night before surgery.  If you shower the day of surgery use CHG.  Use special wash - you have one bottle of CHG for all showers.  You should use approximately 1/3 of the bottle for each shower.   Please read over the following fact sheets that you were given: Pain Booklet, Coughing and Deep Breathing, Blood Transfusion Information, Total Joint Packet and Surgical Site Infection Prevention

## 2012-02-12 NOTE — Anesthesia Preprocedure Evaluation (Addendum)
Anesthesia Evaluation  Patient identified by MRN, date of birth, ID band Patient awake    Reviewed: Allergy & Precautions, H&P , Patient's Chart, lab work & pertinent test results  History of Anesthesia Complications (+) PONV  Airway Mallampati: II      Dental no notable dental hx.    Pulmonary shortness of breath, COPD breath sounds clear to auscultation  Pulmonary exam normal       Cardiovascular hypertension, + CAD + Valvular Problems/Murmurs Rhythm:regular Rate:Normal     Neuro/Psych negative neurological ROS  negative psych ROS   GI/Hepatic negative GI ROS, Neg liver ROS,   Endo/Other  negative endocrine ROS  Renal/GU Renal disease  negative genitourinary   Musculoskeletal   Abdominal Normal abdominal exam  (+)   Peds  Hematology negative hematology ROS (+)   Anesthesia Other Findings PONV (postoperative nausea and vomiting)     Coronary artery disease        Hypertension     Heart murmur        COPD (chronic obstructive pulmonary disease)     Shortness of breath        Arthritis     Hyperlipidemia        Mitral regurgitation     AV (angiodysplasia malformation of colon)        Gout     Benign prostatic hypertrophy        Mitral valve prolapse     Anemia   low hemoglobin    Chronic kidney disease   elevated Cr, BUN, Dr. Caryn Section manages    Reproductive/Obstetrics negative OB ROS                         Anesthesia Physical Anesthesia Plan  ASA: III  Anesthesia Plan: Spinal   Post-op Pain Management:    Induction:   Airway Management Planned:   Additional Equipment: Arterial line  Intra-op Plan:   Post-operative Plan:   Informed Consent: I have reviewed the patients History and Physical, chart, labs and discussed the procedure including the risks, benefits and alternatives for the proposed anesthesia with the patient or authorized representative who has indicated his/her  understanding and acceptance.     Plan Discussed with: Anesthesiologist, CRNA and Surgeon  Anesthesia Plan Comments: (See my consult note.  Patient would like for spinal anesthesia to be considered.  He told me that he would be comfortable with whatever means his anesthesiologist felt was most appropriate for him.  Shonna Chock, PA-C)     severe MR.  If BP support is needed please use very small neo gtt.  If still additional BP support is needed, will add dopamine. Anesthesia Quick Evaluation

## 2012-02-14 MED ORDER — POVIDONE-IODINE 7.5 % EX SOLN
Freq: Once | CUTANEOUS | Status: DC
  Filled 2012-02-14: qty 118

## 2012-02-14 MED ORDER — CEFAZOLIN SODIUM-DEXTROSE 2-3 GM-% IV SOLR
2.0000 g | INTRAVENOUS | Status: AC
  Administered 2012-02-15: 2 g via INTRAVENOUS
  Filled 2012-02-14: qty 50

## 2012-02-15 ENCOUNTER — Encounter (HOSPITAL_COMMUNITY): Payer: Self-pay | Admitting: Vascular Surgery

## 2012-02-15 ENCOUNTER — Encounter (HOSPITAL_COMMUNITY): Payer: Self-pay | Admitting: *Deleted

## 2012-02-15 ENCOUNTER — Inpatient Hospital Stay (HOSPITAL_COMMUNITY)
Admission: RE | Admit: 2012-02-15 | Discharge: 2012-02-18 | DRG: 470 | Disposition: A | Discharge status: Home Health | Payer: Medicare Other | Source: Ambulatory Visit | Attending: Orthopedic Surgery | Admitting: Orthopedic Surgery

## 2012-02-15 ENCOUNTER — Encounter (HOSPITAL_COMMUNITY)
Admission: RE | Disposition: A | Discharge status: Home Health | Payer: Self-pay | Source: Ambulatory Visit | Attending: Orthopedic Surgery

## 2012-02-15 ENCOUNTER — Inpatient Hospital Stay (HOSPITAL_COMMUNITY): Payer: Medicare Other | Admitting: Vascular Surgery

## 2012-02-15 DIAGNOSIS — J449 Chronic obstructive pulmonary disease, unspecified: Secondary | ICD-10-CM | POA: Diagnosis present

## 2012-02-15 DIAGNOSIS — M109 Gout, unspecified: Secondary | ICD-10-CM | POA: Diagnosis present

## 2012-02-15 DIAGNOSIS — Z96659 Presence of unspecified artificial knee joint: Secondary | ICD-10-CM

## 2012-02-15 DIAGNOSIS — E785 Hyperlipidemia, unspecified: Secondary | ICD-10-CM | POA: Diagnosis present

## 2012-02-15 DIAGNOSIS — R110 Nausea: Secondary | ICD-10-CM | POA: Diagnosis not present

## 2012-02-15 DIAGNOSIS — Z7901 Long term (current) use of anticoagulants: Secondary | ICD-10-CM

## 2012-02-15 DIAGNOSIS — M1710 Unilateral primary osteoarthritis, unspecified knee: Principal | ICD-10-CM | POA: Diagnosis present

## 2012-02-15 DIAGNOSIS — N183 Chronic kidney disease, stage 3 unspecified: Secondary | ICD-10-CM | POA: Diagnosis present

## 2012-02-15 DIAGNOSIS — Z87891 Personal history of nicotine dependence: Secondary | ICD-10-CM

## 2012-02-15 DIAGNOSIS — M1712 Unilateral primary osteoarthritis, left knee: Secondary | ICD-10-CM | POA: Diagnosis present

## 2012-02-15 DIAGNOSIS — Z79899 Other long term (current) drug therapy: Secondary | ICD-10-CM

## 2012-02-15 DIAGNOSIS — I059 Rheumatic mitral valve disease, unspecified: Secondary | ICD-10-CM | POA: Diagnosis present

## 2012-02-15 DIAGNOSIS — D62 Acute posthemorrhagic anemia: Secondary | ICD-10-CM | POA: Diagnosis not present

## 2012-02-15 DIAGNOSIS — J4489 Other specified chronic obstructive pulmonary disease: Secondary | ICD-10-CM | POA: Diagnosis present

## 2012-02-15 DIAGNOSIS — I129 Hypertensive chronic kidney disease with stage 1 through stage 4 chronic kidney disease, or unspecified chronic kidney disease: Secondary | ICD-10-CM | POA: Diagnosis present

## 2012-02-15 DIAGNOSIS — I2510 Atherosclerotic heart disease of native coronary artery without angina pectoris: Secondary | ICD-10-CM | POA: Diagnosis present

## 2012-02-15 HISTORY — PX: TOTAL KNEE ARTHROPLASTY: SHX125

## 2012-02-15 SURGERY — ARTHROPLASTY, KNEE, TOTAL
Anesthesia: Spinal | Site: Knee | Laterality: Left | Wound class: Clean

## 2012-02-15 MED ORDER — CEFUROXIME SODIUM 1.5 G IJ SOLR
INTRAMUSCULAR | Status: DC | PRN
  Administered 2012-02-15: 1.5 g

## 2012-02-15 MED ORDER — FERROUS SULFATE 325 (65 FE) MG PO TABS
325.0000 mg | ORAL_TABLET | Freq: Two times a day (BID) | ORAL | Status: DC
  Administered 2012-02-15 – 2012-02-18 (×6): 325 mg via ORAL
  Filled 2012-02-15 (×8): qty 1

## 2012-02-15 MED ORDER — ALLOPURINOL 100 MG PO TABS
100.0000 mg | ORAL_TABLET | Freq: Every day | ORAL | Status: DC
  Administered 2012-02-15 – 2012-02-18 (×4): 100 mg via ORAL
  Filled 2012-02-15 (×4): qty 1

## 2012-02-15 MED ORDER — DOCUSATE SODIUM 100 MG PO CAPS
100.0000 mg | ORAL_CAPSULE | Freq: Two times a day (BID) | ORAL | Status: DC
  Administered 2012-02-15 – 2012-02-18 (×7): 100 mg via ORAL
  Filled 2012-02-15 (×8): qty 1

## 2012-02-15 MED ORDER — WARFARIN VIDEO: Freq: Once | Status: DC

## 2012-02-15 MED ORDER — MEPERIDINE HCL 25 MG/ML IJ SOLN: 6.2500 mg | INTRAMUSCULAR | Status: DC | PRN

## 2012-02-15 MED ORDER — OXYCODONE HCL 5 MG PO TABS
5.0000 mg | ORAL_TABLET | ORAL | Status: DC | PRN
  Administered 2012-02-16: 10 mg via ORAL
  Administered 2012-02-16 (×2): 5 mg via ORAL
  Administered 2012-02-17 (×3): 10 mg via ORAL
  Administered 2012-02-17: 5 mg via ORAL
  Filled 2012-02-15: qty 1
  Filled 2012-02-15: qty 2
  Filled 2012-02-15: qty 1
  Filled 2012-02-15 (×4): qty 2

## 2012-02-15 MED ORDER — COUMADIN BOOK
Freq: Once | Status: DC
  Filled 2012-02-15: qty 1

## 2012-02-15 MED ORDER — SODIUM CHLORIDE 0.9 % IR SOLN
Status: DC | PRN
  Administered 2012-02-15: 3000 mL

## 2012-02-15 MED ORDER — METHOCARBAMOL 500 MG PO TABS
500.0000 mg | ORAL_TABLET | Freq: Four times a day (QID) | ORAL | Status: DC | PRN
  Administered 2012-02-17 (×2): 500 mg via ORAL
  Filled 2012-02-15 (×2): qty 1

## 2012-02-15 MED ORDER — PROPOFOL INFUSION 10 MG/ML OPTIME
INTRAVENOUS | Status: DC | PRN
  Administered 2012-02-15: 25 ug/kg/min via INTRAVENOUS

## 2012-02-15 MED ORDER — FENTANYL CITRATE 0.05 MG/ML IJ SOLN: 25.0000 ug | INTRAMUSCULAR | Status: DC | PRN

## 2012-02-15 MED ORDER — ALUM & MAG HYDROXIDE-SIMETH 200-200-20 MG/5ML PO SUSP: 30.0000 mL | ORAL | Status: DC | PRN

## 2012-02-15 MED ORDER — ATORVASTATIN CALCIUM 10 MG PO TABS
10.0000 mg | ORAL_TABLET | Freq: Every day | ORAL | Status: DC
  Administered 2012-02-15 – 2012-02-17 (×3): 10 mg via ORAL
  Filled 2012-02-15 (×4): qty 1

## 2012-02-15 MED ORDER — MIDAZOLAM HCL 2 MG/2ML IJ SOLN: 0.5000 mg | Freq: Once | INTRAMUSCULAR | Status: DC | PRN

## 2012-02-15 MED ORDER — FERROUS SULFATE 325 (65 FE) MG PO TBEC: 325.0000 mg | DELAYED_RELEASE_TABLET | Freq: Two times a day (BID) | ORAL | Status: DC

## 2012-02-15 MED ORDER — FENTANYL CITRATE 0.05 MG/ML IJ SOLN
INTRAMUSCULAR | Status: DC | PRN
  Administered 2012-02-15: 100 ug via INTRAVENOUS
  Administered 2012-02-15: 50 ug via INTRAVENOUS

## 2012-02-15 MED ORDER — ACETAMINOPHEN 10 MG/ML IV SOLN
INTRAVENOUS | Status: AC
  Filled 2012-02-15: qty 100

## 2012-02-15 MED ORDER — WARFARIN SODIUM 2.5 MG PO TABS
2.5000 mg | ORAL_TABLET | Freq: Once | ORAL | Status: AC
  Administered 2012-02-15: 2.5 mg via ORAL
  Filled 2012-02-15: qty 1

## 2012-02-15 MED ORDER — LACTATED RINGERS IV SOLN
INTRAVENOUS | Status: DC
  Administered 2012-02-15: 10:00:00 via INTRAVENOUS

## 2012-02-15 MED ORDER — ONDANSETRON HCL 4 MG/2ML IJ SOLN
4.0000 mg | Freq: Four times a day (QID) | INTRAMUSCULAR | Status: DC | PRN
  Filled 2012-02-15: qty 2

## 2012-02-15 MED ORDER — CEFAZOLIN SODIUM-DEXTROSE 2-3 GM-% IV SOLR
2.0000 g | Freq: Four times a day (QID) | INTRAVENOUS | Status: AC
  Administered 2012-02-15 – 2012-02-16 (×2): 2 g via INTRAVENOUS
  Filled 2012-02-15 (×2): qty 50

## 2012-02-15 MED ORDER — METOCLOPRAMIDE HCL 5 MG/ML IJ SOLN
5.0000 mg | Freq: Three times a day (TID) | INTRAMUSCULAR | Status: DC | PRN
  Administered 2012-02-16: 10 mg via INTRAVENOUS
  Filled 2012-02-15: qty 2

## 2012-02-15 MED ORDER — POLYETHYLENE GLYCOL 3350 17 G PO PACK: 17.0000 g | PACK | Freq: Every day | ORAL | Status: DC | PRN

## 2012-02-15 MED ORDER — 0.9 % SODIUM CHLORIDE (POUR BTL) OPTIME
TOPICAL | Status: DC | PRN
  Administered 2012-02-15: 1000 mL

## 2012-02-15 MED ORDER — DILTIAZEM HCL ER BEADS 300 MG PO CP24
300.0000 mg | ORAL_CAPSULE | Freq: Every day | ORAL | Status: DC
  Administered 2012-02-16 – 2012-02-18 (×3): 300 mg via ORAL
  Filled 2012-02-15 (×3): qty 1

## 2012-02-15 MED ORDER — DIPHENHYDRAMINE HCL 12.5 MG/5ML PO ELIX: 12.5000 mg | ORAL_SOLUTION | ORAL | Status: DC | PRN

## 2012-02-15 MED ORDER — WARFARIN - PHARMACIST DOSING INPATIENT: Freq: Every day | Status: DC

## 2012-02-15 MED ORDER — PANTOPRAZOLE SODIUM 40 MG PO TBEC: 40.0000 mg | DELAYED_RELEASE_TABLET | Freq: Every day | ORAL | Status: DC

## 2012-02-15 MED ORDER — MIDAZOLAM HCL 5 MG/5ML IJ SOLN
INTRAMUSCULAR | Status: DC | PRN
  Administered 2012-02-15: 2 mg via INTRAVENOUS

## 2012-02-15 MED ORDER — HYDROMORPHONE HCL PF 1 MG/ML IJ SOLN
1.0000 mg | INTRAMUSCULAR | Status: DC | PRN
  Administered 2012-02-15 – 2012-02-16 (×5): 1 mg via INTRAVENOUS
  Filled 2012-02-15 (×5): qty 1

## 2012-02-15 MED ORDER — BUPIVACAINE IN DEXTROSE 0.75-8.25 % IT SOLN
INTRATHECAL | Status: DC | PRN
  Administered 2012-02-15: 2 mL via INTRATHECAL

## 2012-02-15 MED ORDER — PHENYLEPHRINE HCL 10 MG/ML IJ SOLN
10.0000 mg | INTRAVENOUS | Status: DC | PRN
  Administered 2012-02-15: 5 ug/min via INTRAVENOUS

## 2012-02-15 MED ORDER — SODIUM CHLORIDE 0.9 % IV SOLN
INTRAVENOUS | Status: DC
  Administered 2012-02-15 – 2012-02-17 (×4): via INTRAVENOUS

## 2012-02-15 MED ORDER — ZOLPIDEM TARTRATE 5 MG PO TABS: 5.0000 mg | ORAL_TABLET | Freq: Every evening | ORAL | Status: DC | PRN

## 2012-02-15 MED ORDER — METHOCARBAMOL 100 MG/ML IJ SOLN
500.0000 mg | Freq: Four times a day (QID) | INTRAVENOUS | Status: DC | PRN
  Filled 2012-02-15: qty 5

## 2012-02-15 MED ORDER — LISINOPRIL 10 MG PO TABS
10.0000 mg | ORAL_TABLET | Freq: Every day | ORAL | Status: DC
  Administered 2012-02-15: 10 mg via ORAL
  Filled 2012-02-15 (×2): qty 1

## 2012-02-15 MED ORDER — WARFARIN SODIUM 2.5 MG PO TABS: ORAL_TABLET | ORAL | Status: DC

## 2012-02-15 MED ORDER — PANTOPRAZOLE SODIUM 40 MG PO TBEC
40.0000 mg | DELAYED_RELEASE_TABLET | Freq: Every day | ORAL | Status: DC
  Administered 2012-02-15 – 2012-02-18 (×4): 40 mg via ORAL
  Filled 2012-02-15 (×4): qty 1

## 2012-02-15 MED ORDER — COLCHICINE 0.6 MG PO TABS
0.6000 mg | ORAL_TABLET | Freq: Every day | ORAL | Status: DC | PRN
  Administered 2012-02-18: 0.6 mg via ORAL
  Filled 2012-02-15 (×2): qty 1

## 2012-02-15 MED ORDER — METHOCARBAMOL 500 MG PO TABS: ORAL_TABLET | ORAL | Status: DC

## 2012-02-15 MED ORDER — ACETAMINOPHEN 10 MG/ML IV SOLN
INTRAVENOUS | Status: DC | PRN
  Administered 2012-02-15: 1000 mg via INTRAVENOUS

## 2012-02-15 MED ORDER — METOCLOPRAMIDE HCL 10 MG PO TABS
5.0000 mg | ORAL_TABLET | Freq: Three times a day (TID) | ORAL | Status: DC | PRN
  Administered 2012-02-16: 10 mg via ORAL
  Filled 2012-02-15: qty 1

## 2012-02-15 MED ORDER — KETOROLAC TROMETHAMINE 30 MG/ML IJ SOLN: 15.0000 mg | Freq: Once | INTRAMUSCULAR | Status: DC | PRN

## 2012-02-15 MED ORDER — ACETAMINOPHEN 10 MG/ML IV SOLN
1000.0000 mg | Freq: Four times a day (QID) | INTRAVENOUS | Status: AC
  Administered 2012-02-15 – 2012-02-16 (×4): 1000 mg via INTRAVENOUS
  Filled 2012-02-15 (×4): qty 100

## 2012-02-15 MED ORDER — ONDANSETRON HCL 4 MG PO TABS
4.0000 mg | ORAL_TABLET | Freq: Four times a day (QID) | ORAL | Status: DC | PRN
  Administered 2012-02-17: 4 mg via ORAL
  Filled 2012-02-15: qty 1

## 2012-02-15 MED ORDER — OXYCODONE-ACETAMINOPHEN 5-325 MG PO TABS: 1.0000 | ORAL_TABLET | Freq: Four times a day (QID) | ORAL | Status: DC | PRN

## 2012-02-15 MED ORDER — PROMETHAZINE HCL 25 MG/ML IJ SOLN: 6.2500 mg | INTRAMUSCULAR | Status: DC | PRN

## 2012-02-15 SURGICAL SUPPLY — 61 items
BANDAGE ELASTIC 4 VELCRO ST LF (GAUZE/BANDAGES/DRESSINGS) ×2 IMPLANT
BANDAGE ELASTIC 6 VELCRO ST LF (GAUZE/BANDAGES/DRESSINGS) ×2 IMPLANT
BANDAGE ESMARK 6X9 LF (GAUZE/BANDAGES/DRESSINGS) ×1 IMPLANT
BENZOIN TINCTURE PRP APPL 2/3 (GAUZE/BANDAGES/DRESSINGS) ×2 IMPLANT
BLADE SAGITTAL 25.0X1.19X90 (BLADE) ×2 IMPLANT
BLADE SAW SAG 90X13X1.27 (BLADE) ×2 IMPLANT
BNDG ESMARK 6X9 LF (GAUZE/BANDAGES/DRESSINGS) ×2
BOWL SMART MIX CTS (DISPOSABLE) ×2 IMPLANT
CEMENT HV SMART SET (Cement) ×4 IMPLANT
CLOTH BEACON ORANGE TIMEOUT ST (SAFETY) ×2 IMPLANT
CLSR STERI-STRIP ANTIMIC 1/2X4 (GAUZE/BANDAGES/DRESSINGS) ×2 IMPLANT
COVER SURGICAL LIGHT HANDLE (MISCELLANEOUS) ×4 IMPLANT
CUFF TOURNIQUET SINGLE 34IN LL (TOURNIQUET CUFF) ×2 IMPLANT
CUFF TOURNIQUET SINGLE 44IN (TOURNIQUET CUFF) IMPLANT
DRAPE EXTREMITY T 121X128X90 (DRAPE) ×2 IMPLANT
DRAPE U-SHAPE 47X51 STRL (DRAPES) ×2 IMPLANT
DRSG PAD ABDOMINAL 8X10 ST (GAUZE/BANDAGES/DRESSINGS) ×2 IMPLANT
DURAPREP 26ML APPLICATOR (WOUND CARE) ×2 IMPLANT
ELECT REM PT RETURN 9FT ADLT (ELECTROSURGICAL) ×2
ELECTRODE REM PT RTRN 9FT ADLT (ELECTROSURGICAL) ×1 IMPLANT
EVACUATOR 1/8 PVC DRAIN (DRAIN) ×2 IMPLANT
FACESHIELD LNG OPTICON STERILE (SAFETY) ×2 IMPLANT
GAUZE XEROFORM 5X9 LF (GAUZE/BANDAGES/DRESSINGS) ×2 IMPLANT
GLOVE BIOGEL PI IND STRL 8 (GLOVE) ×2 IMPLANT
GLOVE BIOGEL PI INDICATOR 8 (GLOVE) ×2
GLOVE ECLIPSE 7.5 STRL STRAW (GLOVE) ×4 IMPLANT
GOWN PREVENTION PLUS LG XLONG (DISPOSABLE) IMPLANT
GOWN STRL NON-REIN LRG LVL3 (GOWN DISPOSABLE) ×2 IMPLANT
GOWN STRL REIN XL XLG (GOWN DISPOSABLE) ×4 IMPLANT
HANDPIECE INTERPULSE COAX TIP (DISPOSABLE) ×1
HOOD PEEL AWAY FACE SHEILD DIS (HOOD) ×6 IMPLANT
IMMOBILIZER KNEE 20 (SOFTGOODS) ×2
IMMOBILIZER KNEE 20 THIGH 36 (SOFTGOODS) ×1 IMPLANT
IMMOBILIZER KNEE 22 UNIV (SOFTGOODS) IMPLANT
IMMOBILIZER KNEE 24 THIGH 36 (MISCELLANEOUS) IMPLANT
IMMOBILIZER KNEE 24 UNIV (MISCELLANEOUS)
KIT BASIN OR (CUSTOM PROCEDURE TRAY) ×2 IMPLANT
KIT ROOM TURNOVER OR (KITS) ×2 IMPLANT
MANIFOLD NEPTUNE II (INSTRUMENTS) ×2 IMPLANT
NEEDLE HYPO 25GX1X1/2 BEV (NEEDLE) IMPLANT
NS IRRIG 1000ML POUR BTL (IV SOLUTION) ×2 IMPLANT
PACK TOTAL JOINT (CUSTOM PROCEDURE TRAY) ×2 IMPLANT
PAD ARMBOARD 7.5X6 YLW CONV (MISCELLANEOUS) ×4 IMPLANT
PAD CAST 4YDX4 CTTN HI CHSV (CAST SUPPLIES) ×1 IMPLANT
PADDING CAST COTTON 4X4 STRL (CAST SUPPLIES) ×1
SET HNDPC FAN SPRY TIP SCT (DISPOSABLE) ×1 IMPLANT
SPONGE GAUZE 4X4 12PLY (GAUZE/BANDAGES/DRESSINGS) ×2 IMPLANT
STAPLER VISISTAT 35W (STAPLE) IMPLANT
STRIP CLOSURE SKIN 1/2X4 (GAUZE/BANDAGES/DRESSINGS) ×2 IMPLANT
SUCTION FRAZIER TIP 10 FR DISP (SUCTIONS) ×2 IMPLANT
SUT MON AB 3-0 SH 27 (SUTURE) ×1
SUT MON AB 3-0 SH27 (SUTURE) ×1 IMPLANT
SUT VIC AB 0 CTB1 27 (SUTURE) ×4 IMPLANT
SUT VIC AB 1 CT1 27 (SUTURE) ×2
SUT VIC AB 1 CT1 27XBRD ANBCTR (SUTURE) ×2 IMPLANT
SUT VIC AB 2-0 CTB1 (SUTURE) ×4 IMPLANT
SYR CONTROL 10ML LL (SYRINGE) IMPLANT
TOWEL OR 17X24 6PK STRL BLUE (TOWEL DISPOSABLE) ×2 IMPLANT
TOWEL OR 17X26 10 PK STRL BLUE (TOWEL DISPOSABLE) ×2 IMPLANT
TRAY FOLEY CATH 14FR (SET/KITS/TRAYS/PACK) ×2 IMPLANT
WATER STERILE IRR 1000ML POUR (IV SOLUTION) ×4 IMPLANT

## 2012-02-15 NOTE — H&P (Signed)
TOTAL KNEE ADMISSION H&P  Patient is being admitted for left total knee arthroplasty.  Subjective:  Chief Complaint:left knee pain.  HPI: [name], 79 y.o. male, has a history of pain and functional disability in the left knee due to arthritis and has failed non-surgical conservative treatments for greater than 12 weeks to includecorticosteriod injections, viscosupplementation injections, flexibility and strengthening excercises and activity modification.  Onset of symptoms was gradual, starting 7 years ago with gradually worsening course since that time. The patient noted no past surgery on the left knee(s).  Patient currently rates pain in the left knee(s) at 7 out of 10 with activity. Patient has night pain, worsening of pain with activity and weight bearing, pain that interferes with activities of daily living and joint swelling.  Patient has evidence of subchondral cysts, periarticular osteophytes and joint space narrowing by imaging studies. This patient has had failure of conservative care . There is no active infection.  Patient Active Problem List   Diagnosis Date Noted  . Hyperlipidemia   . Anemia, recurrent Hx of colonic AVMs 12/11, transfused, heme stool neg. 05/01/2011  . Dyspnea 04/25/2011  . Mitral regurgitation, severe 04/25/2011  . CAD, single vessel RCA 04/25/2011  . HTN (hypertension) 04/25/2011  . Dyslipidemia 04/25/2011  . Chronic renal insufficiency, stage III (moderate) 04/25/2011  . Gout 04/25/2011   Past Medical History  Diagnosis Date  . Coronary artery disease   . Hypertension   . Heart murmur   . COPD (chronic obstructive pulmonary disease)   . Shortness of breath   . Arthritis   . PONV (postoperative nausea and vomiting)   . Hyperlipidemia   . Mitral regurgitation   . AV (angiodysplasia malformation of colon)   . Gout   . Benign prostatic hypertrophy   . Mitral valve prolapse   . Anemia     low hemoglobin  . Chronic kidney disease      elevated Cr, BUN, Dr. Caryn Section manages    Past Surgical History  Procedure Laterality Date  . Lobectomy  1980    left lower lobectomy for benign tumor  . Replacement total knee  2005    right  . Hernia repair  1999&2000    bilateral  . Tee without cardioversion  04/30/2011    Procedure: TRANSESOPHAGEAL ECHOCARDIOGRAM (TEE);  Surgeon: Thurmon Fair, MD;  Location: [facility] ENDOSCOPY;  Service: Cardiovascular;  Laterality: N/A;  3D TEE/ patient will be admitted the day before test , therefo patient will be a inpatient the day of TEE  . Colonoscopy  05/04/2011    Procedure: COLONOSCOPY;  Surgeon: Theda Belfast, MD;  Location: [facility] ENDOSCOPY;  Service: Endoscopy;  Laterality: N/A;  . Esophagogastroduodenoscopy  05/04/2011    Procedure: ESOPHAGOGASTRODUODENOSCOPY (EGD);  Surgeon: Theda Belfast, MD;  Location: [facility] ENDOSCOPY;  Service: Endoscopy;  Laterality: N/A;  . Cataract extraction  2011    bilateral  . Cardiac catheterization      x2  . Eye surgery      bilateral cataracts removed    Prescriptions prior to admission  Medication Sig Dispense Refill  . acetaminophen (TYLENOL) 500 MG tablet Take 1,000 mg by mouth every 6 (six) hours as needed. Pain      . allopurinol (ZYLOPRIM) 100 MG tablet Take 100 mg by mouth daily.      [name] aspirin EC 81 MG tablet Take 81 mg by mouth daily.      [name] atorvastatin (LIPITOR) 10 MG tablet Take 10 mg by mouth daily.      [name]  diltiazem (TIAZAC) 300 MG 24 hr capsule Take 300 mg by mouth daily.      . ferrous sulfate 325 (65 FE) MG EC tablet Take 325 mg by mouth 2 (two) times daily.      [name] lisinopril (PRINIVIL,ZESTRIL) 10 MG tablet Take 10 mg by mouth daily.      . Omega-3 Fatty Acids (FISH OIL) 1200 MG CAPS Take 1 capsule by mouth 3 (three) times daily.       [name] omeprazole (PRILOSEC) 20 MG capsule Take 20 mg by mouth every other day.      . colchicine 0.6 MG tablet Take 0.6 mg by mouth daily as needed. Gout flare ups.       No Known Allergies  History  Substance Use Topics   . Smoking status: Former Smoker -- 20 years    Quit date: 02/08/1961  . Smokeless tobacco: Former Neurosurgeon    Quit date: 02/08/1961  . Alcohol Use: 0.0 oz/week     Comment: 4ozs Scotch daily    History reviewed. No pertinent family history.   ROS  Objective:  Physical Exam  Vital signs in last 24 hours: Temp:  [97.6 F (36.4 C)] 97.6 F (36.4 C) (02/14 0848) Pulse Rate:  [72] 72 (02/14 0848) Resp:  [20] 20 (02/14 0848) BP: (186)/(78) 186/78 mmHg (02/14 0848) SpO2:  [99 %] 99 % (02/14 0848)  Labs:   Estimated body mass index is 21.77 kg/(m^2) as calculated from the following:   Height as of 02/11/12: 6\' 1"  (1.854 m).   Weight as of 05/14/11: 74.844 kg (165 lb).   Imaging Review Plain radiographs demonstrate severe degenerative joint disease of the left knee(s). The overall alignment ismild valgus. The bone quality appears to be fair for age and reported activity level.  Assessment/Plan:  End stage arthritis, left knee   The patient history, physical examination, clinical judgment of the provider and imaging studies are consistent with end stage degenerative joint disease of the left knee(s) and total knee arthroplasty is deemed medically necessary. The treatment options including medical management, injection therapy arthroscopy and arthroplasty were discussed at length. The risks and benefits of total knee arthroplasty were presented and reviewed. The risks due to aseptic loosening, infection, stiffness, patella tracking problems, thromboembolic complications and other imponderables were discussed. The patient acknowledged the explanation, agreed to proceed with the plan and consent was signed. Patient is being admitted for inpatient treatment for surgery, pain control, PT, OT, prophylactic antibiotics, VTE prophylaxis, progressive ambulation and ADL's and discharge planning. The patient is planning to be discharged to skilled nursing facility

## 2012-02-15 NOTE — Anesthesia Procedure Notes (Signed)
Spinal  Patient location during procedure: OR Start time: 02/15/2012 10:29 AM End time: 02/15/2012 10:39 AM Staffing Anesthesiologist: Angus Seller., Harrell Gave. Performed by: anesthesiologist  Preanesthetic Checklist Completed: patient identified, site marked, surgical consent, pre-op evaluation, timeout performed, IV checked, risks and benefits discussed and monitors and equipment checked Spinal Block Patient position: sitting Prep: DuraPrep Patient monitoring: heart rate, cardiac monitor, continuous pulse ox and blood pressure Approach: midline Location: L3-4 Injection technique: single-shot Needle Needle type: Sprotte  Needle gauge: 24 G Needle length: 9 cm Assessment Sensory level: T4 Additional Notes Patient identified.  Risk benefits discussed including failed block, incomplete pain control, headache, nerve damage, paralysis, blood pressure changes, nausea, vomiting, reactions to medication both toxic or allergic, and postpartum back pain.  Patient expressed understanding and wished to proceed.  All questions were answered.  Sterile technique used throughout procedure.  CSF was clear.  No parasthesia or other complications.  Please see nursing notes for vital signs.

## 2012-02-15 NOTE — Brief Op Note (Signed)
02/15/2012  11:55 AM  PATIENT:  [name]  79 y.o. male  PRE-OPERATIVE DIAGNOSIS:  degenerative joint disease, left knee  POST-OPERATIVE DIAGNOSIS:  degenerative joint disease, left knee  PROCEDURE:  Procedure(s): TOTAL KNEE ARTHROPLASTY (Left)  SURGEON:  Surgeon(s) and Role:    * Harvie Junior, MD - Primary  PHYSICIAN ASSISTANT:   ASSISTANTS: bethune   ANESTHESIA:   spinal  EBL:  Total I/O In: -  Out: 100 [Urine:100]  BLOOD ADMINISTERED:none  DRAINS: (1) Hemovact drain(s) in the l knee with  Suction Open   LOCAL MEDICATIONS USED:  NONE  SPECIMEN:  No Specimen  DISPOSITION OF SPECIMEN:  N/A  COUNTS:  YES  TOURNIQUET:   Total Tourniquet Time Documented: Thigh (Left) - 52 minutes Total: Thigh (Left) - 52 minutes   DICTATION: .Other Dictation: Dictation Number [id number]  PLAN OF CARE: Admit to inpatient   PATIENT DISPOSITION:  PACU - hemodynamically stable.   Delay start of Pharmacological VTE agent (>24hrs) due to surgical blood loss or risk of bleeding: no

## 2012-02-15 NOTE — Plan of Care (Signed)
Problem: Consults Goal: Diagnosis- Total Joint Replacement Primary Total Knee Left     

## 2012-02-15 NOTE — Op Note (Signed)
[name], [name]              ACCOUNT NO.:  [id number]  MEDICAL RECORD NO.:  [id number]  LOCATION:  5N30C                        FACILITY:  [facility]  PHYSICIAN:  Harvie Junior, M.D.   DATE OF BIRTH:  [birth year]  DATE OF PROCEDURE:  02/15/2012 DATE OF DISCHARGE:                              OPERATIVE REPORT   PREOPERATIVE DIAGNOSIS:  End-stage degenerative joint disease, left knee.  POSTOPERATIVE DIAGNOSIS:  End-stage degenerative joint disease, left knee.  PRINCIPAL PROCEDURE:  Left total knee replacement with a Sigma system, size 4 femur, size 5 tibia, 12.5-mm bridging bearing, and a 41-mm all polyethylene patella.  SURGEON:  Harvie Junior, M.D.  ASSISTANT:  Marshia Ly, P.A.  ANESTHESIA:  Spinal anesthetic.  BRIEF HISTORY:  Mr. Barthelemy is an 79 year old male with a long history of having had severe end-stage degenerative joint disease of the left knee. He has been having severe pain for many years.  He has been having some worsening renal function and was concerned about this and was trying to hold off as long as he could.  He failed injection therapy, viscous supplementation, and activity modification.  Because of failure of all conservative cares, he was taken to the operating room for left total knee replacement.  PROCEDURE:  The patient was taken to the operating room and after routine spinal anesthesia was administered, the patient was placed supine on the operating table.  Left leg was prepped and draped in usual sterile fashion.  Following this, the leg was exsanguinated.  Blood pressure tourniquet was inflated to 350 mmHg.  Following this, a midline incision was made, subcutaneous tissue down the level of the extensor mechanism and a medial parapatellar arthrotomy was undertaken.  The subcutaneous tissue down the level of the knee and a medial parapatellar arthrotomy was undertaken as described above.  At this point, the medial and lateral meniscus were  removed, retropatellar fat pad, synovium in the anterior aspect of the femur and the anterior and posterior cruciates.  At this point, the knee was exposed.  The extramedullary guide was used for the tibia and the tibia was cut perpendicular to its long axis.  Care being taken to set the appropriate height and flexion parameters.  Once this was done, attention was turned to the femur where an intramedullary rod was placed.  The femur was then cut perpendicular to its anatomic axis with a 5-degree valgus cut.  This was cut at 10 mm. Spacer block was put in place and gave good gap balance.  At this point, attention was then turned to the femur, which was sized to a 4. Anterior and posterior cuts were made, chamfer cuts and box was made and attention was turned to the tibia, sized to a 5, was drilled and keeled. Trial components were placed with a 12.5-mm bridging bearing gave easy full extension and perfect gap balance and no rotational concerns with flexion of the knee.  At this point, the patella was measured, really it was quite a big patella with a 31-mm highest diameter and 27 was the low point.  I could find on the patella, so we cut it down to a level of 65 mm and then  resurfaced it with a 41 all poly patella.  The trial was put in.  Knee was put through a range of motion with excellent stability and range of motion.  At this point, attention was turned towards the removal of trial components.  The knee was copiously and thoroughly lavaged with normal saline irrigation and suctioned dry.  At this point, the final components were cemented into place, size 4 femur, size 5 tibia, 12.5-mm bridging bearing trial was placed, and a 41 all poly patella.  We then allowed the cement to harden.  Once the cement was hardened, all excess bone cement had been removed during the hardening process.  We trialed briefly a 15 poly, but really was could not get full extension and was too tight in  flexion.  At that point, a new 12.5 was appropriate, and the 12.5 was opened and placed.  Knee put through a range of motion, perfect stability, range of motion, good gap balance. At this point, a medium Hemovac drain was placed and the knee was then closed with 1 Vicryl running, the skin with 0 and 2-0 Vicryl and 3-0 Monocryl subcuticular.  Benzoin and Steri-Strips were applied.  Sterile compressive dressing was applied, and the patient was taken to the recovery room, was noted to be in satisfactory condition.  Estimated blood loss for the procedure was none.  Of note, after the cement was allowed to harden, tourniquet was let down, all bleedings were controlled with electrocautery, and there was really minimal significant bleeding at that point.     Harvie Junior, M.D.     Ranae Plumber  D:  02/15/2012  T:  02/15/2012  Job:  [id number]

## 2012-02-15 NOTE — Transfer of Care (Signed)
Immediate Anesthesia Transfer of Care Note  Patient: [name]  Procedure(s) Performed: Procedure(s): TOTAL KNEE ARTHROPLASTY (Left)  Patient Location: PACU  Anesthesia Type:MAC and Spinal  Level of Consciousness: awake, alert  and oriented  Airway & Oxygen Therapy: Patient Spontanous Breathing  Post-op Assessment: Report given to PACU RN, Post -op Vital signs reviewed and stable and Patient moving all extremities  Post vital signs: Reviewed and stable  Complications: No apparent anesthesia complications

## 2012-02-15 NOTE — Progress Notes (Addendum)
ANTICOAGULATION CONSULT NOTE - Initial Consult  Pharmacy Consult for coumadin Indication: VTE prophylaxis  No Known Allergies  Patient Measurements:   Heparin Dosing Weight:   Vital Signs: Temp: 97.2 F (36.2 C) (02/14 1508) Temp src: Oral (02/14 0848) BP: 143/49 mmHg (02/14 1508) Pulse Rate: 54 (02/14 1508)  Labs: No results found for this basename: HGB, HCT, PLT, APTT, LABPROT, INR, HEPARINUNFRC, CREATININE, CKTOTAL, CKMB, TROPONINI,  in the last 72 hours  The CrCl is unknown because both a height and weight (above a minimum accepted value) are required for this calculation.   Medical History: Past Medical History  Diagnosis Date  . Coronary artery disease   . Hypertension   . Heart murmur   . COPD (chronic obstructive pulmonary disease)   . Shortness of breath   . Arthritis   . PONV (postoperative nausea and vomiting)   . Hyperlipidemia   . Mitral regurgitation   . AV (angiodysplasia malformation of colon)   . Gout   . Benign prostatic hypertrophy   . Mitral valve prolapse   . Anemia     low hemoglobin  . Chronic kidney disease     elevated Cr, BUN, Dr. Caryn Section manages    Medications:  Scheduled:  . [COMPLETED]  ceFAZolin (ANCEF) IV  2 g Intravenous On Call to OR  . [DISCONTINUED] povidone-iodine   Topical Once   Infusions:  . sodium chloride    . lactated ringers 50 mL/hr at 02/15/12 1610    Assessment: 79 yo male s/p TKA will be started on coumadin therapy.  INR on 02/11/12 was 1.05. On allopurinol. Goal of Therapy:  1.5-2     Plan:  1) Coumadin 2.5mg  po x1 2) INR in am 3) Coumadin booklet and video  Desire Fulp, Tsz-Yin 02/15/2012,3:09 PM

## 2012-02-15 NOTE — Progress Notes (Signed)
Orthopedic Tech Progress Note Patient Details:  [name] 1925-01-08 [id number]  CPM Left Knee CPM Left Knee: On Left Knee Flexion (Degrees): 60 Left Knee Extension (Degrees): 0 Additional Comments: trapeze bar   Shawnie Pons 02/15/2012, 1:02 PM

## 2012-02-15 NOTE — Anesthesia Postprocedure Evaluation (Signed)
  Anesthesia Post Note  Patient: [name]  Procedure(s) Performed: Procedure(s) (LRB): TOTAL KNEE ARTHROPLASTY (Left)  Anesthesia type: GA  Patient location: PACU  Post pain: Pain level controlled  Post assessment: Post-op Vital signs reviewed  Last Vitals:  Filed Vitals:   02/15/12 1230  BP:   Pulse:   Temp: 36.3 C  Resp:     Post vital signs: Reviewed  Level of consciousness: sedated  Complications: No apparent anesthesia complications

## 2012-02-15 NOTE — Progress Notes (Signed)
Utilization review completed. Ioannis Schuh, RN, BSN. 

## 2012-02-15 NOTE — Preoperative (Signed)
Beta Blockers   Reason not to administer Beta Blockers:Not Applicable 

## 2012-02-16 LAB — BASIC METABOLIC PANEL
Chloride: 102 mEq/L (ref 96–112)
Creatinine, Ser: 1.5 mg/dL — ABNORMAL HIGH (ref 0.50–1.35)
GFR calc Af Amer: 48 mL/min — ABNORMAL LOW (ref >90)

## 2012-02-16 LAB — CBC
MCV: 94.8 fL (ref 78.0–100.0)
Platelets: 124 10*3/uL — ABNORMAL LOW (ref 150–400)
RDW: 13.8 % (ref 11.5–15.5)
WBC: 7.5 10*3/uL (ref 4.0–10.5)

## 2012-02-16 LAB — PROTIME-INR
INR: 1.13 (ref 0.00–1.49)
Prothrombin Time: 14.3 seconds (ref 11.6–15.2)

## 2012-02-16 MED ORDER — WARFARIN SODIUM 2.5 MG PO TABS
2.5000 mg | ORAL_TABLET | Freq: Once | ORAL | Status: AC
  Administered 2012-02-16: 2.5 mg via ORAL
  Filled 2012-02-16: qty 1

## 2012-02-16 MED ORDER — LISINOPRIL 10 MG PO TABS
10.0000 mg | ORAL_TABLET | Freq: Every day | ORAL | Status: DC
  Administered 2012-02-17: 10 mg via ORAL
  Filled 2012-02-16 (×2): qty 1

## 2012-02-16 NOTE — Progress Notes (Signed)
ANTICOAGULATION CONSULT NOTE - Follow Up Consult  Pharmacy Consult for coumadin Indication: VTE prophylaxis  No Known Allergies  Patient Measurements:   Heparin Dosing Weight:   Vital Signs: Temp: 98.2 F (36.8 C) (02/15 0752) Temp src: Oral (02/15 0752) BP: 161/51 mmHg (02/15 0752) Pulse Rate: 66 (02/15 0752)  Labs:  Recent Labs  02/16/12 0630  HGB 8.9*  HCT 25.5*  PLT 124*  LABPROT 14.3  INR 1.13  CREATININE 1.50*    The CrCl is unknown because both a height and weight (above a minimum accepted value) are required for this calculation.   Medications:  Scheduled:  . acetaminophen  1,000 mg Intravenous Q6H  . allopurinol  100 mg Oral Daily  . atorvastatin  10 mg Oral q1800  . [COMPLETED]  ceFAZolin (ANCEF) IV  2 g Intravenous Q6H  . coumadin book   Does not apply Once  . diltiazem  300 mg Oral Daily  . docusate sodium  100 mg Oral BID  . ferrous sulfate  325 mg Oral BID WC  . [START ON 02/17/2012] lisinopril  10 mg Oral Q supper  . pantoprazole  40 mg Oral Daily  . [COMPLETED] warfarin  2.5 mg Oral ONCE-1800  . warfarin   Does not apply Once  . Warfarin - Pharmacist Dosing Inpatient   Does not apply q1800  . [DISCONTINUED] ferrous sulfate  325 mg Oral BID  . [DISCONTINUED] lisinopril  10 mg Oral Daily  . [DISCONTINUED] pantoprazole  40 mg Oral Daily  . [DISCONTINUED] povidone-iodine   Topical Once   Infusions:  . sodium chloride 100 mL/hr at 02/16/12 1026  . [DISCONTINUED] lactated ringers 50 mL/hr at 02/15/12 7253    Assessment: 79 yo male s/p TKA is currently on subtherapeutic coumadin>  INR is 1.13.  On allopurinol. Goal of Therapy:  INR 1.5-2    Plan:  1) Repeat coumadin 2.5mg  po x1 2) INR in am.  [name], [name] 02/16/2012,11:41 AM

## 2012-02-16 NOTE — Progress Notes (Signed)
Subjective: 1 Day Post-Op Procedure(s) (LRB): TOTAL KNEE ARTHROPLASTY (Left) C/o N and V Activity level:  wbat Diet tolerance:  Not yet due to n/v Voiding:  ok Patient reports pain as 3 on 0-10 scale.    Objective: Vital signs in last 24 hours: Temp:  [97.1 F (36.2 C)-98.2 F (36.8 C)] 98.2 F (36.8 C) (02/15 0752) Pulse Rate:  [50-72] 66 (02/15 0752) Resp:  [14-20] 18 (02/15 0752) BP: (110-186)/(5-78) 161/51 mmHg (02/15 0752) SpO2:  [96 %-100 %] 96 % (02/15 0752)  Labs:  Recent Labs  02/16/12 0630  HGB 8.9*    Recent Labs  02/16/12 0630  WBC 7.5  RBC 2.69*  HCT 25.5*  PLT 124*    Recent Labs  02/16/12 0630  NA 133*  K 4.3  CL 102  CO2 20  BUN 29*  CREATININE 1.50*  GLUCOSE 131*  CALCIUM 8.7    Recent Labs  02/16/12 0630  INR 1.13    Physical Exam:  Neurologically intact ABD soft Neurovascular intact Sensation intact distally Intact pulses distally Dorsiflexion/Plantar flexion intact Incision: dressing C/D/I No cellulitis present Compartment soft Drain pulled  Assessment/Plan:  1 Day Post-Op Procedure(s) (LRB): TOTAL KNEE ARTHROPLASTY (Left) Advance diet Up with therapy Discharge home with home health Advance diet slowly Cont iv fluids Will watch hgb Coumadin to prevent dvt   Advit Trethewey R 02/16/2012, 8:40 AM

## 2012-02-16 NOTE — Progress Notes (Signed)
Patient ID: [name], male   DOB: 1925-01-17, 79 y.o.   MRN: [id number] Pt has been up with PT and doing well.  Pt has stable BUN/Cr after surgery with acceptable HGb.  Nausea still seems to be a problem and pain ios well controlled.  Will continue to monitor HGb and BUN/Cr and will likely go home tomorrow if stable.  Reglan seems to help nausea and can use this at home as needed. Sabriyah Wilcher

## 2012-02-16 NOTE — Progress Notes (Signed)
Physical Therapy Treatment Patient Details Name: [name] MRN: [id number] DOB: 1925/04/19 Today's Date: 02/16/2012 Time: 1914-7829 PT Time Calculation (min): 31 min  PT Assessment / Plan / Recommendation Comments on Treatment Session  Wife and daughter present for part of session.  Pt feeling much better this pm.  Progressing well with mobility.      Follow Up Recommendations  Home health PT     Does the patient have the potential to tolerate intense rehabilitation     Barriers to Discharge        Equipment Recommendations  None recommended by PT    Recommendations for Other Services    Frequency 7X/week   Plan Discharge plan remains appropriate    Precautions / Restrictions Precautions Precautions: Knee Required Braces or Orthoses: Knee Immobilizer - Left Knee Immobilizer - Left: Discontinue once straight leg raise with < 10 degree lag Restrictions Weight Bearing Restrictions: Yes LLE Weight Bearing: Weight bearing as tolerated   Pertinent Vitals/Pain 4/10 pain left knee.  Had po meds earlier per pt    Mobility  Bed Mobility Details for Bed Mobility Assistance: NT, pt sitting EOB Transfers Transfers: Sit to Stand;Stand to Sit Sit to Stand: 4: Min assist;From bed;With upper extremity assist Stand to Sit: 4: Min assist;With upper extremity assist;With armrests;To chair/3-in-1 Details for Transfer Assistance: cues for safest technique Ambulation/Gait Ambulation/Gait Assistance: 4: Min assist Ambulation Distance (Feet): 40 Feet Assistive device: Rolling walker Gait Pattern: Step-to pattern Gait velocity: decreased General Gait Details: able to tolerate more wight on LLE this session Stairs: No    Exercises Total Joint Exercises Ankle Circles/Pumps: AROM;Both;Supine;10 reps Quad Sets: AROM;Left;10 reps;Supine Hip ABduction/ADduction: 10 reps;Left;AAROM;Supine Straight Leg Raises: Left;10 reps;AAROM;Supine Long Arc Quad: Left;10 reps;Seated;AAROM Knee  Flexion: Left;5 reps;Seated Goniometric ROM: 77 degrees flexion,  lacking about 10 degrees extension (pt with bulky bandage which makes goniometric measurement  l)   PT Diagnosis:    PT Problem List:   PT Treatment Interventions:     PT Goals Acute Rehab PT Goals PT Goal Formulation: With patient/family Time For Goal Achievement: 02/19/12 Potential to Achieve Goals: Good Pt will go Supine/Side to Sit: with modified independence PT Goal: Supine/Side to Sit - Progress: Progressing toward goal Pt will go Sit to Stand: with supervision PT Goal: Sit to Stand - Progress: Progressing toward goal Pt will Ambulate: 51 - 150 feet;with supervision;with rolling walker PT Goal: Ambulate - Progress: Progressing toward goal Pt will Go Up / Down Stairs: 3-5 stairs;with min assist;with rail(s) PT Goal: Up/Down Stairs - Progress: Goal set today Pt will Perform Home Exercise Program: with supervision, verbal cues required/provided PT Goal: Perform Home Exercise Program - Progress: Progressing toward goal  Visit Information  Last PT Received On: 02/16/12 Assistance Needed: +1    Subjective Data      Cognition  Cognition Overall Cognitive Status: Appears within functional limits for tasks assessed/performed Arousal/Alertness: Awake/alert Orientation Level: Oriented X4 / Intact Behavior During Session: [facility] for tasks performed    Balance     End of Session PT - End of Session Equipment Utilized During Treatment: Gait belt;Left knee immobilizer Activity Tolerance: Patient tolerated treatment well;Other (comment) (no nausea this session) Patient left: in chair;with call bell/phone within reach;with family/visitor present Nurse Communication: Mobility status CPM Left Knee CPM Left Knee: On   GP     Donnella Sham 02/16/2012, 2:46 PM Lavona Mound, PT  [phone] 02/16/2012

## 2012-02-16 NOTE — Progress Notes (Signed)
Physical Therapy Evaluation Patient Details Name: [name] MRN: [id number] DOB: 1925/03/05 Today's Date: 02/16/2012 Time: 4098-1191 PT Time Calculation (min): 25 min  PT Assessment / Plan / Recommendation Clinical Impression  79 yo M s/p LTKA.  Presents to PT with dependencies with mobility.  Anticipate pt will progress quickly and be able to DC home with HHPT once nausea improves.      PT Assessment  Patient needs continued PT services    Follow Up Recommendations  Home health PT          Barriers to Discharge        Equipment Recommendations  None recommended by PT    Recommendations for Other Services     Frequency 7X/week    Precautions / Restrictions Precautions Precautions: Knee Required Braces or Orthoses: Knee Immobilizer - Left Knee Immobilizer - Left: Discontinue once straight leg raise with < 10 degree lag Restrictions Weight Bearing Restrictions: Yes LLE Weight Bearing: Weight bearing as tolerated   Pertinent Vitals/Pain 5/10 in bed in L knee.  Ice applied at end of session      Mobility  Bed Mobility Bed Mobility: Supine to Sit Supine to Sit: 4: Min assist;HOB elevated;With rails Transfers Transfers: Sit to Stand;Stand to Sit Sit to Stand: 4: Min assist;From elevated surface;From bed Stand to Sit: 4: Min assist;With upper extremity assist;With armrests;To chair/3-in-1 Details for Transfer Assistance: cues for safest technique Ambulation/Gait Ambulation/Gait Assistance: 4: Min assist Ambulation Distance (Feet): 3 Feet Assistive device: Rolling walker Ambulation/Gait Assistance Details: distance limited secondary to pt nauseated Gait Pattern: Step-to pattern Gait velocity: decreased General Gait Details: pt reluctant to put weight through LLE Stairs: No    Exercises Total Joint Exercises Ankle Circles/Pumps: AROM;Both;5 reps;Supine   PT Diagnosis: Difficulty walking  PT Problem List: Decreased strength;Decreased range of  motion;Decreased mobility;Decreased knowledge of use of DME;Pain PT Treatment Interventions: DME instruction;Gait training;Stair training;Therapeutic exercise;Patient/family education   PT Goals Acute Rehab PT Goals PT Goal Formulation: With patient/family Time For Goal Achievement: 02/19/12 Potential to Achieve Goals: Good Pt will go Supine/Side to Sit: with modified independence PT Goal: Supine/Side to Sit - Progress: Goal set today Pt will go Sit to Stand: with supervision PT Goal: Sit to Stand - Progress: Goal set today Pt will Ambulate: 51 - 150 feet;with supervision;with rolling walker PT Goal: Ambulate - Progress: Goal set today Pt will Go Up / Down Stairs: 3-5 stairs;with min assist;with rail(s) PT Goal: Up/Down Stairs - Progress: Goal set today Pt will Perform Home Exercise Program: with supervision, verbal cues required/provided PT Goal: Perform Home Exercise Program - Progress: Goal set today  Visit Information  Last PT Received On: 02/16/12 Assistance Needed: +1    Subjective Data  Patient Stated Goal: to be able to go up steps and eat    Prior Functioning  Home Living Lives With: Spouse Available Help at Discharge: Family;Available 24 hours/day Type of Home: House Home Access: Stairs to enter [employer] of Steps: 9 Entrance Stairs-Rails: Right Home Layout: One level Home Adaptive Equipment: Bedside commode/3-in-1;Walker - rolling;Straight cane;Crutches Prior Function Level of Independence: Independent Able to Take Stairs?: Yes Communication Communication: No difficulties    Cognition  Cognition Overall Cognitive Status: Appears within functional limits for tasks assessed/performed Arousal/Alertness: Awake/alert Orientation Level: Oriented X4 / Intact Behavior During Session: [facility] for tasks performed    Extremity/Trunk Assessment Right Lower Extremity Assessment RLE ROM/Strength/Tone: [facility] for tasks assessed Left Lower Extremity Assessment LLE  ROM/Strength/Tone: Deficits;Due to pain LLE ROM/Strength/Tone Deficits:  decreased knee ROM and strength   Balance    End of Session PT - End of Session Equipment Utilized During Treatment: Gait belt;Left knee immobilizer Activity Tolerance: Patient tolerated treatment well;Other (comment) (pt nauseated-believes it is from IV pain meds) Patient left: in chair;with call bell/phone within reach;with family/visitor present Nurse Communication: Mobility status CPM Left Knee CPM Left Knee: Off Additional Comments: trapeze bar       Donnella Sham 02/16/2012, 10:28 AM Lavona Mound, PT  [phone] 02/16/2012

## 2012-02-16 NOTE — Progress Notes (Signed)
CARE MANAGEMENT NOTE 02/16/2012  Patient:  [name], [name]   Account Number:  [id number]  Date Initiated:  02/16/2012  Documentation initiated by:  Vance Peper  Subjective/Objective Assessment:   79 yr old male s/p left total knee arthroplasty.     Action/Plan:   Cm spoke with patient and wife concerning DME and home health needs at discharge. Choice offered. Patient has used [facility] in past and wants to do so now. patient has rolling walker and 3in1. CPM to be delivered.   Anticipated DC Date:  02/18/2012   Anticipated DC Plan:  HOME W HOME HEALTH SERVICES      DC Planning Services  CM consult      [facility] Choice  HOME HEALTH   Choice offered to / List presented to:  C-1 Patient        HH arranged  HH-1 RN  HH-2 PT      [facility] agency  [facility]   Status of service:  In process, will continue to follow Medicare Important Message given?   (If response is "NO", the following Medicare IM given date fields will be blank) Date Medicare IM given:   Date Additional Medicare IM given:    Discharge Disposition:    Per UR Regulation:    If discussed at Long Length of Stay Meetings, dates discussed:    Comments:

## 2012-02-17 LAB — BASIC METABOLIC PANEL
CO2: 20 mEq/L (ref 19–32)
Calcium: 8.7 mg/dL (ref 8.4–10.5)
GFR calc Af Amer: 46 mL/min — ABNORMAL LOW (ref >90)
GFR calc non Af Amer: 39 mL/min — ABNORMAL LOW (ref >90)
Sodium: 133 mEq/L — ABNORMAL LOW (ref 135–145)

## 2012-02-17 LAB — CBC
MCH: 33.6 pg (ref 26.0–34.0)
MCHC: 35.3 g/dL (ref 30.0–36.0)
Platelets: 110 10*3/uL — ABNORMAL LOW (ref 150–400)
RBC: 2.47 MIL/uL — ABNORMAL LOW (ref 4.22–5.81)

## 2012-02-17 LAB — PROTIME-INR: Prothrombin Time: 14.3 seconds (ref 11.6–15.2)

## 2012-02-17 MED ORDER — WARFARIN SODIUM 5 MG PO TABS
5.0000 mg | ORAL_TABLET | Freq: Once | ORAL | Status: AC
  Administered 2012-02-17: 5 mg via ORAL
  Filled 2012-02-17: qty 1

## 2012-02-17 NOTE — Progress Notes (Signed)
Subjective: 2 Days Post-Op Procedure(s) (LRB): TOTAL KNEE ARTHROPLASTY (Left) Doing better with less n/v today. Is not eating much yet. Taking fluids ok Activity level:  wbat Diet tolerance:  Minimal eating Voiding:  ok Patient reports pain as 3 on 0-10 scale.    Objective: Vital signs in last 24 hours: Temp:  [98.3 F (36.8 C)-98.6 F (37 C)] 98.5 F (36.9 C) (02/16 0559) Pulse Rate:  [65-73] 73 (02/16 0559) Resp:  [18] 18 (02/16 0559) BP: (148-165)/(52-63) 151/63 mmHg (02/16 0559) SpO2:  [96 %-99 %] 99 % (02/16 0559)  Labs:  Recent Labs  02/16/12 0630 02/17/12 0436  HGB 8.9* 8.3*    Recent Labs  02/16/12 0630 02/17/12 0436  WBC 7.5 7.9  RBC 2.69* 2.47*  HCT 25.5* 23.5*  PLT 124* 110*    Recent Labs  02/16/12 0630 02/17/12 0436  NA 133* 133*  K 4.3 4.1  CL 102 104  CO2 20 20  BUN 29* 21  CREATININE 1.50* 1.55*  GLUCOSE 131* 100*  CALCIUM 8.7 8.7    Recent Labs  02/16/12 0630 02/17/12 0436  INR 1.13 1.13    Physical Exam:  Neurologically intact ABD soft Neurovascular intact Sensation intact distally Intact pulses distally Dorsiflexion/Plantar flexion intact Incision: dressing C/D/I No cellulitis present Compartment soft  Assessment/Plan:  2 Days Post-Op Procedure(s) (LRB): TOTAL KNEE ARTHROPLASTY (Left) Advance diet Up with therapy D/C IV fluids Plan for discharge tomorrow if continues to improve Coumadin to prevent DVT Encourage diet and fluids    Edilson Vital R 02/17/2012, 12:05 PM

## 2012-02-17 NOTE — Progress Notes (Signed)
Physical Therapy Treatment Patient Details Name: [name] MRN: [id number] DOB: [birth year] Today's Date: 02/17/2012 Time: 7829-5621 PT Time Calculation (min): 33 min  PT Assessment / Plan / Recommendation Comments on Treatment Session  Stair training completed.  Pt may want to practice again in AM prior to d/c.    Follow Up Recommendations  Home health PT     Does the patient have the potential to tolerate intense rehabilitation     Barriers to Discharge        Equipment Recommendations  None recommended by PT    Recommendations for Other Services    Frequency 7X/week   Plan Discharge plan remains appropriate;Frequency remains appropriate    Precautions / Restrictions Precautions Precautions: Knee Required Braces or Orthoses: Knee Immobilizer - Left Knee Immobilizer - Left: Discontinue once straight leg raise with < 10 degree lag Restrictions LLE Weight Bearing: Weight bearing as tolerated   Pertinent Vitals/Pain     Mobility  Bed Mobility Bed Mobility: Sit to Supine Supine to Sit: 4: Min assist;With rails;HOB elevated Sit to Supine: 4: Min assist;HOB flat Details for Bed Mobility Assistance: assist with LLE during sit to supine Transfers Sit to Stand: 4: Min assist;With upper extremity assist;From bed;From chair/3-in-1 Stand to Sit: 4: Min assist;To chair/3-in-1;To bed;With upper extremity assist Details for Transfer Assistance: verbal cues for sequencing Ambulation/Gait Ambulation/Gait Assistance: 4: Min assist Ambulation Distance (Feet): 120 Feet Assistive device: Rolling walker Gait Pattern: Step-to pattern Gait velocity: decreased Stairs: Yes Stairs Assistance: 4: Min assist Stairs Assistance Details (indicate cue type and reason): verbal cues for sequencing Stair Management Technique: One rail Left;With crutches;Forwards (1 crutch on R) Number of Stairs: 3    Exercises Total Joint Exercises Ankle Circles/Pumps: AROM;Both;10 reps Quad Sets:  AROM;Left;10 reps Heel Slides: AROM;Left;10 reps Hip ABduction/ADduction: AAROM;Left;10 reps Goniometric ROM: 5-75 degrees L knee AAROM   PT Diagnosis:    PT Problem List:   PT Treatment Interventions:     PT Goals Acute Rehab PT Goals PT Goal: Supine/Side to Sit - Progress: Progressing toward goal PT Goal: Sit to Stand - Progress: Progressing toward goal PT Goal: Ambulate - Progress: Progressing toward goal PT Goal: Up/Down Stairs - Progress: Progressing toward goal PT Goal: Perform Home Exercise Program - Progress: Progressing toward goal  Visit Information  Last PT Received On: 02/17/12 Assistance Needed: +1    Subjective Data  Subjective: "I am not quite ready to go home today." Patient Stated Goal: home tomorrow   Cognition  Cognition Overall Cognitive Status: Appears within functional limits for tasks assessed/performed Arousal/Alertness: Awake/alert Orientation Level: Oriented X4 / Intact Behavior During Session: [facility] for tasks performed    Balance     End of Session PT - End of Session Equipment Utilized During Treatment: Gait belt;Left knee immobilizer Activity Tolerance: Patient tolerated treatment well Patient left: in bed;with call bell/phone within reach CPM Left Knee CPM Left Knee: On Left Knee Flexion (Degrees): 65 Left Knee Extension (Degrees): 0   GP     [name] 02/17/2012, 2:41 PM   [name], PT  Office # [phone] Pager [phone]

## 2012-02-17 NOTE — Progress Notes (Addendum)
ANTICOAGULATION CONSULT NOTE - Follow Up Consult  Pharmacy Consult for coumadin Indication: VTE prophylaxis  No Known Allergies  Patient Measurements:   Heparin Dosing Weight:   Vital Signs: Temp: 98.5 F (36.9 C) (02/16 0559) BP: 151/63 mmHg (02/16 0559) Pulse Rate: 73 (02/16 0559)  Labs:  Recent Labs  02/16/12 0630 02/17/12 0436  HGB 8.9* 8.3*  HCT 25.5* 23.5*  PLT 124* 110*  LABPROT 14.3 14.3  INR 1.13 1.13  CREATININE 1.50* 1.55*    The CrCl is unknown because both a height and weight (above a minimum accepted value) are required for this calculation.   Medications:  Scheduled:  . [COMPLETED] acetaminophen  1,000 mg Intravenous Q6H  . allopurinol  100 mg Oral Daily  . atorvastatin  10 mg Oral q1800  . coumadin book   Does not apply Once  . diltiazem  300 mg Oral Daily  . docusate sodium  100 mg Oral BID  . ferrous sulfate  325 mg Oral BID WC  . lisinopril  10 mg Oral Q supper  . pantoprazole  40 mg Oral Daily  . [COMPLETED] warfarin  2.5 mg Oral ONCE-1800  . warfarin   Does not apply Once  . Warfarin - Pharmacist Dosing Inpatient   Does not apply q1800   Infusions:  . sodium chloride 100 mL/hr at 02/17/12 1610    Assessment: 79 yo male s/p TKA is currently on subtherapeutic coumadin.  INR didn't change; still at 1.13 after 2.5mg  of coumadin dose.  On allopuriniol Goal of Therapy:  INR 1.5-2    Plan:  1) Coumadin 5 mg po x1 2) Daily PT/INR   Kealii Thueson, Tsz-Yin 02/17/2012,11:30 AM

## 2012-02-17 NOTE — Progress Notes (Signed)
Physical Therapy Treatment Patient Details Name: [name] MRN: [id number] DOB: 19-May-1925 Today's Date: 02/17/2012 Time: 3086-5784 PT Time Calculation (min): 25 min  PT Assessment / Plan / Recommendation Comments on Treatment Session  Good progress noted with gait distance.  No nausea today.    Follow Up Recommendations  Home health PT     Does the patient have the potential to tolerate intense rehabilitation     Barriers to Discharge        Equipment Recommendations  None recommended by PT    Recommendations for Other Services    Frequency 7X/week   Plan Discharge plan remains appropriate;Frequency remains appropriate    Precautions / Restrictions Precautions Precautions: Knee Required Braces or Orthoses: Knee Immobilizer - Left Knee Immobilizer - Left: Discontinue once straight leg raise with < 10 degree lag Restrictions Weight Bearing Restrictions: Yes LLE Weight Bearing: Weight bearing as tolerated   Pertinent Vitals/Pain 5/10    Mobility  Transfers Sit to Stand: 4: Min assist;From chair/3-in-1;With armrests Stand to Sit: 4: Min assist;To chair/3-in-1;With armrests Details for Transfer Assistance: verbal cues for sequencing Ambulation/Gait Ambulation/Gait Assistance: 4: Min assist Ambulation Distance (Feet): 120 Feet Assistive device: Rolling walker Gait Pattern: Step-to pattern Gait velocity: decreased    Exercises Total Joint Exercises Ankle Circles/Pumps: AROM;Both;10 reps Quad Sets: AROM;Left;10 reps Heel Slides: AROM;Left;10 reps Hip ABduction/ADduction: AAROM;Left;10 reps Goniometric ROM: 5-75 degrees L knee AAROM   PT Diagnosis:    PT Problem List:   PT Treatment Interventions:     PT Goals Acute Rehab PT Goals PT Goal: Supine/Side to Sit - Progress: Progressing toward goal PT Goal: Sit to Stand - Progress: Progressing toward goal PT Goal: Ambulate - Progress: Progressing toward goal PT Goal: Up/Down Stairs - Progress: Progressing  toward goal PT Goal: Perform Home Exercise Program - Progress: Progressing toward goal  Visit Information  Last PT Received On: 02/17/12 Assistance Needed: +1    Subjective Data  Subjective: "I am not quite ready to go home today." Patient Stated Goal: home tomorrow   Cognition  Cognition Overall Cognitive Status: Appears within functional limits for tasks assessed/performed Arousal/Alertness: Awake/alert Orientation Level: Oriented X4 / Intact Behavior During Session: [facility] for tasks performed    Balance     End of Session PT - End of Session Equipment Utilized During Treatment: Gait belt;Left knee immobilizer Activity Tolerance: Patient tolerated treatment well Patient left: in chair;with call bell/phone within reach;with family/visitor present CPM Left Knee CPM Left Knee: Off Additional Comments: trapeze bar   GP     [name] 02/17/2012, 11:59 AM  [name], PT  Office # [phone] Pager [phone]

## 2012-02-18 ENCOUNTER — Encounter (HOSPITAL_COMMUNITY): Payer: Self-pay | Admitting: Orthopedic Surgery

## 2012-02-18 DIAGNOSIS — D62 Acute posthemorrhagic anemia: Secondary | ICD-10-CM | POA: Diagnosis not present

## 2012-02-18 LAB — BASIC METABOLIC PANEL
BUN: 24 mg/dL — ABNORMAL HIGH (ref 6–23)
Chloride: 101 mEq/L (ref 96–112)
Creatinine, Ser: 1.71 mg/dL — ABNORMAL HIGH (ref 0.50–1.35)
GFR calc Af Amer: 41 mL/min — ABNORMAL LOW (ref >90)
Glucose, Bld: 115 mg/dL — ABNORMAL HIGH (ref 70–99)
Potassium: 4.1 mEq/L (ref 3.5–5.1)

## 2012-02-18 LAB — CBC
HCT: 23.2 % — ABNORMAL LOW (ref 39.0–52.0)
Hemoglobin: 8.2 g/dL — ABNORMAL LOW (ref 13.0–17.0)
MCHC: 35.3 g/dL (ref 30.0–36.0)
MCV: 95.1 fL (ref 78.0–100.0)
RDW: 13.6 % (ref 11.5–15.5)

## 2012-02-18 MED ORDER — WARFARIN SODIUM 5 MG PO TABS
5.0000 mg | ORAL_TABLET | Freq: Once | ORAL | Status: DC
  Filled 2012-02-18: qty 1

## 2012-02-18 MED ORDER — WARFARIN SODIUM 5 MG PO TABS: ORAL_TABLET | ORAL | Status: DC

## 2012-02-18 NOTE — Progress Notes (Addendum)
Subjective: 3 Days Post-Op Procedure(s) (LRB): TOTAL KNEE ARTHROPLASTY (Left) Patient reports pain as 2 on 0-10 scale.   No dizziness when up. Taking po/voiding ok. "I'm ready to go home"  Objective: Vital signs in last 24 hours: Temp:  [98.7 F (37.1 C)-99.4 F (37.4 C)] 98.7 F (37.1 C) (02/17 0609) Pulse Rate:  [71-79] 79 (02/17 0609) Resp:  [18-19] 18 (02/17 0609) BP: (129-148)/(41-46) 147/46 mmHg (02/17 0609) SpO2:  [95 %-98 %] 97 % (02/17 0609)  Intake/Output from previous day: 02/16 0701 - 02/17 0700 In: 720 [P.O.:720] Out: 900 [Urine:900] Intake/Output this shift:     Recent Labs  02/16/12 0630 02/17/12 0436 02/18/12 0625  HGB 8.9* 8.3* 8.2*    Recent Labs  02/17/12 0436 02/18/12 0625  WBC 7.9 7.8  RBC 2.47* 2.44*  HCT 23.5* 23.2*  PLT 110* 114*    Recent Labs  02/17/12 0436 02/18/12 0625  NA 133* 133*  K 4.1 4.1  CL 104 101  CO2 20 22  BUN 21 24*  CREATININE 1.55* 1.71*  GLUCOSE 100* 115*  CALCIUM 8.7 9.0    Recent Labs  02/17/12 0436 02/18/12 0625  INR 1.13 1.08    Neurovascular intact Sensation intact distally Intact pulses distally Dorsiflexion/Plantar flexion intact Incision: no drainage No cellulitis present Compartment soft  Assessment/Plan: 3 Days Post-Op Procedure(s) (LRB): TOTAL KNEE ARTHROPLASTY (Left) Acute Blood Loss Anemia Plan: Discharge home with home health Cont oral Iron on discharge.  Rector Devonshire G 02/18/2012, 8:12 AM

## 2012-02-18 NOTE — Progress Notes (Signed)
ANTICOAGULATION CONSULT NOTE - Follow Up Consult  Pharmacy Consult for coumadin Indication: VTE prophylaxis  No Known Allergies  Patient Measurements:   Heparin Dosing Weight:   Vital Signs: Temp: 98 F (36.7 C) (02/17 1059) BP: 103/41 mmHg (02/17 1120) Pulse Rate: 88 (02/17 1059)  Labs:  Recent Labs  02/16/12 0630 02/17/12 0436 02/18/12 0625  HGB 8.9* 8.3* 8.2*  HCT 25.5* 23.5* 23.2*  PLT 124* 110* 114*  LABPROT 14.3 14.3 13.9  INR 1.13 1.13 1.08  CREATININE 1.50* 1.55* 1.71*    The CrCl is unknown because both a height and weight (above a minimum accepted value) are required for this calculation.   Medications:  Scheduled:  . allopurinol  100 mg Oral Daily  . atorvastatin  10 mg Oral q1800  . coumadin book   Does not apply Once  . diltiazem  300 mg Oral Daily  . docusate sodium  100 mg Oral BID  . ferrous sulfate  325 mg Oral BID WC  . lisinopril  10 mg Oral Q supper  . pantoprazole  40 mg Oral Daily  . [COMPLETED] warfarin  5 mg Oral ONCE-1800  . warfarin   Does not apply Once  . Warfarin - Pharmacist Dosing Inpatient   Does not apply q1800   Infusions:     Assessment: 79 yo male s/p TKA is currently on subtherapeutic coumadin.  INR 1.08    On allopuriniol Goal of Therapy:  INR 1.5-2    Plan:  1) Coumadin 5 mg po x1 2) Daily PT/INR   Raylee Strehl Poteet 02/18/2012,2:53 PM

## 2012-02-18 NOTE — Progress Notes (Signed)
Patient experiencing some dizziness upon ambulation with therapy today in the am. Patient without any other complaints.  BP 118/50 once returned to room, no longer feeling dizzy.  Spoke with Gus Puma, PA for Dr. Luiz Blare, asked to have therapy work with patient again in afternoon in order to determine if d/c was appropriate today.  Patient worked with therapy again, did not have further dizziness this afternoon. JIm Bethune in to see patient.  Pt ok to d/c per plan.  D/C instructions and prescriptions given to pt and wife, instructions reviewed.  IV d/c.  Patient d/c from unit without any s/s of distress.

## 2012-02-18 NOTE — Discharge Summary (Signed)
Patient ID: [name] MRN: [id number] DOB/AGE: 1925-04-22 79 y.o.  Admit date: 02/15/2012 Discharge date: 02/18/2012  Admission Diagnoses:  Principal Problem:   Osteoarthritis of left knee Active Problems:   Postoperative anemia due to acute blood loss   Discharge Diagnoses:  Same  Past Medical History  Diagnosis Date  . Coronary artery disease   . Hypertension   . Heart murmur   . COPD (chronic obstructive pulmonary disease)   . Shortness of breath   . Arthritis   . PONV (postoperative nausea and vomiting)   . Hyperlipidemia   . Mitral regurgitation   . AV (angiodysplasia malformation of colon)   . Gout   . Benign prostatic hypertrophy   . Mitral valve prolapse   . Anemia     low hemoglobin  . Chronic kidney disease     elevated Cr, BUN, Dr. Caryn Section manages    Surgeries: Procedure(s): TOTAL KNEE ARTHROPLASTY on 02/15/2012   Discharged Condition: Improved  Hospital Course: KYRO JOSWICK is an 79 y.o. male who was admitted 02/15/2012 for operative treatment ofOsteoarthritis of left knee. Patient has severe unremitting pain that affects sleep, daily activities, and work/hobbies. After pre-op clearance the patient was taken to the operating room on 02/15/2012 and underwent  Procedure(s):Left TOTAL KNEE ARTHROPLASTY.    Patient was given perioperative antibiotics: Anti-infectives   Start     Dose/Rate Route Frequency Ordered Stop   02/15/12 1800  ceFAZolin (ANCEF) IVPB 2 g/50 mL premix     2 g 100 mL/hr over 30 Minutes Intravenous Every 6 hours 02/15/12 1512 02/16/12 0124   02/15/12 1057  cefUROXime (ZINACEF) injection  Status:  Discontinued       As needed 02/15/12 1057 02/15/12 1222   02/15/12 0600  ceFAZolin (ANCEF) IVPB 2 g/50 mL premix     2 g 100 mL/hr over 30 Minutes Intravenous On call to O.R. 02/14/12 1229 02/15/12 1030       Patient was given sequential compression devices, early ambulation, and chemoprophylaxis to prevent DVT.  Patient benefited  maximally from hospital stay and there were no complications. Mild dizziness x 1 when up with PT on date of discharge home at 1st PT. Did fine on 2nd PT treatment.    Recent vital signs: Patient Vitals for the past 24 hrs:  BP Temp Pulse Resp SpO2  02/18/12 1120 103/41 mmHg - - - -  02/18/12 1115 118/50 mmHg - - - -  02/18/12 1108 103/41 mmHg - - - -  02/18/12 1059 127/43 mmHg 98 F (36.7 C) 88 18 97 %  02/18/12 1000 156/68 mmHg - - - -  02/18/12 0609 147/46 mmHg 98.7 F (37.1 C) 79 18 97 %  02/17/12 2222 148/44 mmHg 99.4 F (37.4 C) 71 18 95 %     Recent laboratory studies:  Recent Labs  02/17/12 0436 02/18/12 0625  WBC 7.9 7.8  HGB 8.3* 8.2*  HCT 23.5* 23.2*  PLT 110* 114*  NA 133* 133*  K 4.1 4.1  CL 104 101  CO2 20 22  BUN 21 24*  CREATININE 1.55* 1.71*  GLUCOSE 100* 115*  INR 1.13 1.08  CALCIUM 8.7 9.0     Discharge Medications:     Medication List    STOP taking these medications       aspirin EC 81 MG tablet      TAKE these medications       acetaminophen 500 MG tablet  Commonly known as:  TYLENOL  Take  1,000 mg by mouth every 6 (six) hours as needed. Pain     allopurinol 100 MG tablet  Commonly known as:  ZYLOPRIM  Take 100 mg by mouth daily.     atorvastatin 10 MG tablet  Commonly known as:  LIPITOR  Take 10 mg by mouth daily.     colchicine 0.6 MG tablet  Take 0.6 mg by mouth daily as needed. Gout flare ups.     diltiazem 300 MG 24 hr capsule  Commonly known as:  TIAZAC  Take 300 mg by mouth daily.     ferrous sulfate 325 (65 FE) MG EC tablet  Take 325 mg by mouth 2 (two) times daily.     Fish Oil 1200 MG Caps  Take 1 capsule by mouth 3 (three) times daily.     lisinopril 10 MG tablet  Commonly known as:  PRINIVIL,ZESTRIL  Take 10 mg by mouth daily.     methocarbamol 500 MG tablet  Commonly known as:  ROBAXIN-750  One every 8 hrs prn spasm.     omeprazole 20 MG capsule  Commonly known as:  PRILOSEC  Take 20 mg by mouth every  other day.     oxyCODONE-acetaminophen 5-325 MG per tablet  Commonly known as:  PERCOCET/ROXICET  Take 1-2 tablets by mouth every 6 (six) hours as needed for pain.     warfarin 5 MG tablet  Commonly known as:  COUMADIN  One daily unless otherwise directed. Shoot for INR of 1.5-2.0. Treat x 1 month.          Disposition: 01-Home or Self Care      Discharge Orders   Future Orders Complete By Expires     CPM  As directed     Comments:      Continuous passive motion machine (CPM):      Use the CPM from 0 degrees to70 degrees for 6-8 hours per day.      You may increase by 5 degrees per day.  You may break it up into 2 or 3 sessions per day.      Use CPM for1-2 weeks or until you are told to stop.    Call MD / Call 911  As directed     Comments:      If you experience chest pain or shortness of breath, CALL 911 and be transported to the hospital emergency room.  If you develope a fever above 101 F, pus (white drainage) or increased drainage or redness at the wound, or calf pain, call your surgeon's office.    Constipation Prevention  As directed     Comments:      Drink plenty of fluids.  Prune juice may be helpful.  You may use a stool softener, such as Colace (over the counter) 100 mg twice a day.  Use MiraLax (over the counter) for constipation as needed.    Diet general  As directed     Do not put a pillow under the knee. Place it under the heel.  As directed     Increase activity slowly as tolerated  As directed     TED hose  As directed     Comments:      Use stockings (TED hose) for 2  weeks on both leg(s).  You may remove them at night for sleeping.    Weight bearing as tolerated  As directed      Will have HHRN do CBC and BMET on 02/22/12 and send  results to [place].  Follow-up Information   Follow up with GRAVES,JOHN L, MD. Schedule an appointment as soon as possible for a visit in 2 weeks.   Contact information:   [address] [place] [address] [phone]         Signed: Matthew Folks 02/18/2012, 3:58 PM

## 2012-02-18 NOTE — Progress Notes (Signed)
Physical Therapy Treatment Patient Details Name: [name] MRN: [id number] DOB: 06-27-1925 Today's Date: 02/18/2012 Time: 4098-1191 PT Time Calculation (min): 39 min  PT Assessment / Plan / Recommendation Comments on Treatment Session  79 y/o male s/p L TKA. Noted events of this morning. Pt and wife concerned about stairs and inquiring about w/c reporting they have very few steps in the back of the house but would be worried about him trying to walk around the back of the house. Spoke with PA and he is OK with pt renting w/c for now until he is not having this dizziness anymore. Session focus this afternoon on patient safety once d/c home concerning appropriate supervision for patient with transfers and ambulation. They deny further concerns at this time and will d/c this afternoon.     Follow Up Recommendations  Home health PT;Supervision for mobility/OOB     Does the patient have the potential to tolerate intense rehabilitation     Barriers to Discharge        Equipment Recommendations  Wheelchair (measurements PT)    Recommendations for Other Services    Frequency 7X/week   Plan Discharge plan remains appropriate;Frequency remains appropriate    Precautions / Restrictions Precautions Precautions: Fall Required Braces or Orthoses: Knee Immobilizer - Left Knee Immobilizer - Left: Discontinue once straight leg raise with < 10 degree lag Restrictions LLE Weight Bearing: Weight bearing as tolerated   Pertinent Vitals/Pain Complaining of 7-8/10 left knee pain and 6-7 right gout pain in his foot; RN aware but patient declines pain meds at this time    Mobility  Bed Mobility Bed Mobility: Not assessed Transfers Transfers: Sit to Stand;Stand to Sit (4 trials) Sit to Stand: From chair/3-in-1;With upper extremity assist;With armrests;3: Mod assist;4: Min assist Stand to Sit: To chair/3-in-1;With armrests;With upper extremity assist;3: Mod assist;4: Min assist Details for  Transfer Assistance: Trials 1 and 2 with the therapist with patient needing mod facilitation through trunk to stabilze during pre-lift and as he reached for the RW; 2nd 2 trials patient and his wife practicing safe technique with therapist providing verbal cues for safety and close gaurd; 2nd trial with wife and patient appeared safe and wife demonstrating good assist technique Ambulation/Gait Ambulation Distance (Feet): 40 Feet Assistive device: Rolling walker Ambulation/Gait Assistance Details: 1 seated rest after 30 ft due to patient fatigue and dizziness; cues for tall posture and forward gaze; 2nd attempt at ambulation pt with increased complaints of dizziness and patient became more impulsive trying get a certain distance, verbal cues provided to take a rest especially if feeling dizzy Gait Pattern: Step-to pattern General Gait Details: knee immobilizer on (pt feels more comfortable with it on) although PA present during session and said it was OK to remove it during our session Stairs Assistance: Not tested (comment) Stairs Assistance Details (indicate cue type and reason): pt, wife and daughter educated on how to use w/c going up backwards on stairs; they report they can wheel him to the back of the house and there are two very short steps to bump him in to the house      PT Goals Acute Rehab PT Goals PT Goal: Sit to Stand - Progress: Progressing toward goal PT Goal: Ambulate - Progress: Progressing toward goal  Visit Information  Last PT Received On: 02/18/12 Assistance Needed: +1 (+2 for chair follow)    Subjective Data  Subjective: I'd like to give it a try.  Patient Stated Goal: to get a w/c to get  in the house   Cognition  Cognition Overall Cognitive Status: Appears within functional limits for tasks assessed/performed Arousal/Alertness: Awake/alert Orientation Level: Appears intact for tasks assessed Behavior During Session: [facility] for tasks performed Cognition - Other  Comments: became slightly impulsive as he became more dizzy    Balance     End of Session PT - End of Session Equipment Utilized During Treatment: Gait belt Activity Tolerance: Patient tolerated treatment well Patient left: in chair;with call bell/phone within reach Nurse Communication: Mobility status   GP     [facility] HELEN 02/18/2012, 4:38 PM

## 2012-02-18 NOTE — Progress Notes (Signed)
CARE MANAGEMENT NOTE 02/18/2012  Patient:  ISSAIAH, SEABROOKS   Account Number:  [id number]  Date Initiated:  02/16/2012  Documentation initiated by:  Vance Peper  Subjective/Objective Assessment:   79 yr old male s/p left total knee arthroplasty.     Action/Plan:   Cm spoke with patient and wife concerning DME and home health needs at discharge. Choice offered. Patient has used [facility] in past and wants to do so now. patient has rolling walker and 3in1. CPM to be delivered.   Anticipated DC Date:  02/18/2012   Anticipated DC Plan:  HOME W HOME HEALTH SERVICES      DC Planning Services  CM consult      [facility] Choice  HOME HEALTH   Choice offered to / List presented to:  C-1 Patient        HH arranged  HH-1 RN  HH-2 PT      [facility] agency  [facility]   Status of service:  Completed, signed off Medicare Important Message given?   (If response is "NO", the following Medicare IM given date fields will be blank) Date Medicare IM given:   Date Additional Medicare IM given:    Discharge Disposition:  HOME W HOME HEALTH SERVICES  Per UR Regulation:    If discussed at Long Length of Stay Meetings, dates discussed:    Comments:

## 2012-02-18 NOTE — Progress Notes (Signed)
Physical Therapy Treatment Patient Details Name: CORRIN HINGLE MRN: [id number] DOB: 1925/08/04 Today's Date: 02/18/2012 Time: 4098-1191 PT Time Calculation (min): 52 min  PT Assessment / Plan / Recommendation Comments on Treatment Session  Pt is scheduled for D/C home today. Pt requested additional step training prior to D/C. Pt became dizzy after coming down from the steps and required to be lowered down to a chair to prevent from falling. Pt RN notified and BP checked. Pt sat for 10 mins. and said he felt better and wanted to try again. After the second attempt, pt became dizzy again and required to sit down and BP 103/41.  At this point pt is not safe to d/c home until he can safely ambulate and manage steps without  dizziness. Pt would have fallen if not supported by the therapist completely. MD notified of events and we will continue to follow pt until D/c.    Follow Up Recommendations  Home health PT     Does the patient have the potential to tolerate intense rehabilitation     Barriers to Discharge        Equipment Recommendations  None recommended by PT    Recommendations for Other Services    Frequency 7X/week   Plan Discharge plan remains appropriate    Precautions / Restrictions Precautions Precautions: Knee Required Braces or Orthoses: Knee Immobilizer - Left Knee Immobilizer - Right: Discontinue once straight leg raise with < 10 degree lag Restrictions LLE Weight Bearing: Weight bearing as tolerated   Pertinent Vitals/Pain BP 103/41 after step training and c/o dizziness.     Mobility  Bed Mobility Bed Mobility: Supine to Sit Supine to Sit: 4: Min assist;HOB elevated Details for Bed Mobility Assistance: cues for technique and assistance supporting Left LE Transfers Transfers: Sit to Stand Sit to Stand: 4: Min assist;With upper extremity assist;From bed;From chair/3-in-1;With armrests Stand to Sit: 4: Min assist;With armrests;To chair/3-in-1 Details for  Transfer Assistance: cues for increasing forward trunk flexion and feet positioning. Ambulation/Gait Ambulation/Gait Assistance: 4: Min assist Ambulation Distance (Feet): 6 Feet (focused on step training not walking today) Assistive device: Rolling walker Ambulation/Gait Assistance Details: cues to place heel down on Left foot and to WB down on left as tolerated. Gait Pattern: Step-to pattern Gait velocity: decreased Stairs: Yes Stairs Assistance: 3: Mod assist Stairs Assistance Details (indicate cue type and reason): 2 trails-1 crutch/rail and then only rail side stepping up. On both trials pt became dizzy and needed to be assisted back to the chair. During the first trial pt had to be lowered and help to prevent from falling down. RN notified and BP checked. Stair Management Technique: One rail Left;With crutches Number of Stairs: 2    Exercises Total Joint Exercises Ankle Circles/Pumps: AROM;Strengthening;Both;Seated Quad Sets: AROM;Strengthening;Left;10 reps;Supine Straight Leg Raises: AROM;Strengthening;Left;10 reps;Supine Long Arc Quad: AAROM;Strengthening;Left;10 reps;Seated Knee Flexion: AAROM;Strengthening;Left;10 reps;Seated   PT Diagnosis:    PT Problem List:   PT Treatment Interventions:     PT Goals Acute Rehab PT Goals PT Goal: Supine/Side to Sit - Progress: Progressing toward goal PT Goal: Sit to Stand - Progress: Progressing toward goal PT Goal: Ambulate - Progress: Progressing toward goal PT Goal: Up/Down Stairs - Progress: Progressing toward goal PT Goal: Perform Home Exercise Program - Progress: Progressing toward goal  Visit Information  Last PT Received On: 02/18/12 Assistance Needed: +1    Subjective Data  Subjective: I feel dizzy.   Cognition  Cognition Overall Cognitive Status: Appears within functional limits for tasks  assessed/performed Arousal/Alertness: Awake/alert Orientation Level: Appears intact for tasks assessed Behavior During Session:  [facility] for tasks performed    Balance     End of Session PT - End of Session Equipment Utilized During Treatment: Gait belt Activity Tolerance: Other (comment) (treatment limited by dizziness) Patient left: in chair;with call bell/phone within reach;with family/visitor present Nurse Communication: Mobility status   GP     Greggory Stallion 02/18/2012, 11:35 AM

## 2012-04-11 ENCOUNTER — Other Ambulatory Visit (HOSPITAL_COMMUNITY): Payer: Self-pay | Admitting: Cardiovascular Disease

## 2012-04-11 DIAGNOSIS — Q233 Congenital mitral insufficiency: Secondary | ICD-10-CM

## 2012-04-29 ENCOUNTER — Ambulatory Visit (HOSPITAL_COMMUNITY)
Admission: RE | Admit: 2012-04-29 | Discharge: 2012-04-29 | Disposition: A | Discharge status: Home / Self Care | Payer: Medicare Other | Source: Ambulatory Visit | Attending: Cardiovascular Disease | Admitting: Cardiovascular Disease

## 2012-04-29 DIAGNOSIS — Q233 Congenital mitral insufficiency: Secondary | ICD-10-CM

## 2012-04-29 DIAGNOSIS — I059 Rheumatic mitral valve disease, unspecified: Secondary | ICD-10-CM | POA: Insufficient documentation

## 2012-04-29 NOTE — Progress Notes (Signed)
[facility]   2D echo completed 04/29/2012.   Cindy Ebrahim Deremer, RDCS  

## 2012-07-28 ENCOUNTER — Other Ambulatory Visit: Payer: Self-pay | Admitting: *Deleted

## 2012-07-28 MED ORDER — ATORVASTATIN CALCIUM 10 MG PO TABS: 10.0000 mg | ORAL_TABLET | Freq: Every day | ORAL | Status: DC

## 2012-12-31 ENCOUNTER — Other Ambulatory Visit: Payer: Self-pay | Admitting: Internal Medicine

## 2012-12-31 ENCOUNTER — Ambulatory Visit
Admission: RE | Admit: 2012-12-31 | Discharge: 2012-12-31 | Disposition: A | Discharge status: Home / Self Care | Payer: Medicare Other | Source: Ambulatory Visit | Attending: Internal Medicine | Admitting: Internal Medicine

## 2012-12-31 DIAGNOSIS — R042 Hemoptysis: Secondary | ICD-10-CM

## 2013-05-13 ENCOUNTER — Other Ambulatory Visit: Payer: Self-pay

## 2013-05-13 MED ORDER — ATORVASTATIN CALCIUM 10 MG PO TABS: 10.0000 mg | ORAL_TABLET | Freq: Every day | ORAL | Status: DC

## 2013-05-13 NOTE — Telephone Encounter (Signed)
Rx was sent to pharmacy electronically. 

## 2013-06-09 ENCOUNTER — Encounter: Payer: Self-pay | Admitting: Cardiovascular Disease

## 2013-06-09 ENCOUNTER — Ambulatory Visit (INDEPENDENT_AMBULATORY_CARE_PROVIDER_SITE_OTHER): Payer: Medicare Other | Admitting: Cardiovascular Disease

## 2013-06-09 VITALS — BP 168/60 | HR 76 | Ht 72.0 in | Wt 181.1 lb

## 2013-06-09 DIAGNOSIS — I2510 Atherosclerotic heart disease of native coronary artery without angina pectoris: Secondary | ICD-10-CM

## 2013-06-09 DIAGNOSIS — E785 Hyperlipidemia, unspecified: Secondary | ICD-10-CM

## 2013-06-09 DIAGNOSIS — I340 Nonrheumatic mitral (valve) insufficiency: Secondary | ICD-10-CM

## 2013-06-09 DIAGNOSIS — R0989 Other specified symptoms and signs involving the circulatory and respiratory systems: Secondary | ICD-10-CM

## 2013-06-09 DIAGNOSIS — I059 Rheumatic mitral valve disease, unspecified: Secondary | ICD-10-CM

## 2013-06-09 DIAGNOSIS — I10 Essential (primary) hypertension: Secondary | ICD-10-CM

## 2013-06-09 NOTE — Assessment & Plan Note (Signed)
Well-controlled on current medications 

## 2013-06-09 NOTE — Patient Instructions (Signed)
Your physician has requested that you have an echocardiogram. Echocardiography is a painless test that uses sound waves to create images of your heart. It provides your doctor with information about the size and shape of your heart and how well your heart's chambers and valves are working. This procedure takes approximately one hour. There are no restrictions for this procedure.  Your physician has requested that you have a carotid duplex. This test is an ultrasound of the carotid arteries in your neck. It looks at blood flow through these arteries that supply the brain with blood. Allow one hour for this exam. There are no restrictions or special instructions.  Dr Gwenlyn Found wants you to follow-up in 1 year. You will receive a reminder letter in the mail one months in advance. If you don't receive a letter, please call our office to schedule the follow-up appointment.

## 2013-06-09 NOTE — Assessment & Plan Note (Signed)
Wall history of MR by 2-D echo with holosystolic prolapse involving the posterior leaflet. His EF and LV size were normal by echo 04/29/12 with moderate to severe MR directed eccentrically toward the septum. He is asymptomatic. I will recheck a 2-D echocardiogram. Of note, this 80 year old son recently had minimally invasive mitral valve repair by Dr. Roxy Manns for mitral valve prolapse 2 weeks ago.

## 2013-06-09 NOTE — Assessment & Plan Note (Signed)
On statin therapy with recent lipid profile performed 01/22/13 revealing a total cholesterol of 131, LDL 54 HDL of 61

## 2013-06-09 NOTE — Assessment & Plan Note (Signed)
History of CAD status post cardiac catheterization by myself in 2013 with only one vessel disease with a 70% mid RCA and normal LV function. He is asymptomatic

## 2013-06-09 NOTE — Progress Notes (Signed)
06/09/2013 [name]   September 16, 1925  [id number]  Primary Physician Tyler Seashore, MD Primary Cardiologist: Tyler Harp MD [name]   HPI:  The patient is a very pleasant 80 year old thin-appearing married Caucasian male, father of 2, grandfather to 2 grandchildren, who is accompanied by his wife today. I last saw him 6 months ago. He has a history of noncritical CAD by catheterization performed by myself, August 15, 2009, with moderate disease in his mid RCA, LAD, and ramus branch, and normal LV function. He did have severe MR secondary to mitral valve prolapse with severe left atrial enlargement. His other problems include hypertension and hyperlipidemia. He was complaining of increasing dyspnea. I brought him in, performing right and left heart catheterization, April 30, 2011, revealing a 50% proximal ramus branch stenosis, 70% mid dominant RCA stenosis just after the takeoff of an acute marginal branch. A TEE performed by Dr. 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 He was fairly anemic at that time and was seen by Dr. Carol [name]. He underwent upper and lower endoscopy, revealing AVMs. His hemoglobin improved with iron repletion and his symptoms resolved. He is currently asymptomatic with a hemoglobin of 9.7 and excellent lipid profile. His last 2-D echocardiogram performed/14 revealed normal LV size and function with moderate to severe MR, mitral valve prolapse of the posterior leaflet.    Current Outpatient Prescriptions  Medication Sig Dispense Refill  . allopurinol (ZYLOPRIM) 300 MG tablet Take 300 mg by mouth daily.      [name] aspirin EC 81 MG tablet Take 81 mg by mouth daily.      [name] atorvastatin (LIPITOR) 10 MG tablet Take 1 tablet (10 mg total) by mouth daily.  90 tablet  0  . diltiazem (TIAZAC) 300 MG 24 hr capsule Take 300 mg by mouth daily.      [name] lisinopril (PRINIVIL,ZESTRIL) 40  MG tablet Take 40 mg by mouth daily.      [name] omeprazole (PRILOSEC) 20 MG capsule Take 20 mg by mouth every other day.       No current facility-administered medications for this visit.    No Known Allergies  History   Social History  . Marital Status: Married    Spouse Name: N/A    Number of Children: N/A  . Years of Education: N/A   Occupational History  . Not on file.   Social History Main Topics  . Smoking status: Former Smoker -- 20 years    Quit date: 02/08/1961  . Smokeless tobacco: Former Systems developer    Quit date: 02/08/1961  . Alcohol Use: 0.0 oz/week     Comment: 4ozs Scotch daily  . Drug Use: No  . Sexual Activity: Not Currently   Other Topics Concern  . Not on file   Social History Narrative  . No narrative on file     Review of Systems: General: negative for chills, fever, night sweats or weight changes.  Cardiovascular: negative for chest pain, dyspnea on exertion, edema, orthopnea, palpitations, paroxysmal nocturnal dyspnea or shortness of breath Dermatological: negative for rash Respiratory: negative for cough or wheezing Urologic: negative for hematuria Abdominal: negative for nausea, vomiting, diarrhea, bright red blood per rectum, melena, or hematemesis Neurologic: negative for visual changes, syncope, or dizziness All other systems reviewed and are otherwise negative except as noted above.    Blood pressure 168/60, pulse 76, height 6' (1.829 m), weight 181 lb 1.6 oz (  82.146 kg).  General appearance: alert and no distress Neck: no adenopathy, no JVD, supple, symmetrical, trachea midline, thyroid: normal to inspection and palpation and bilateral carotid bruits Lungs: clear to auscultation bilaterally Heart: 3 over 6 systolic murmur heard best at the apex Extremities: extremities normal, atraumatic, no cyanosis or edema  EKG normal size and 76 without ST or T wave changes  ASSESSMENT AND PLAN:   Mitral regurgitation, severe Wall history of MR by 2-D  echo with holosystolic prolapse involving the posterior leaflet. His EF and LV size were normal by echo 04/29/12 with moderate to severe MR directed eccentrically toward the septum. He is asymptomatic. I will recheck a 2-D echocardiogram. Of note, this 62 year old son recently had minimally invasive mitral valve repair by Dr. Roxy Manns for mitral valve prolapse 2 weeks ago.  Hyperlipidemia On statin therapy with recent lipid profile performed 01/22/13 revealing a total cholesterol of 131, LDL 54 HDL of 61  HTN (hypertension) Well-controlled on current medications  CAD, single vessel RCA History of CAD status post cardiac catheterization by myself in 2013 with only one vessel disease with a 70% mid RCA and normal LV function. He is asymptomatic      Tyler Harp MD [facility] 06/09/2013 10:23 AM

## 2013-06-23 ENCOUNTER — Ambulatory Visit (HOSPITAL_COMMUNITY)
Admission: RE | Admit: 2013-06-23 | Discharge: 2013-06-23 | Disposition: A | Discharge status: Home / Self Care | Payer: Medicare Other | Source: Ambulatory Visit | Attending: Cardiovascular Disease | Admitting: Cardiovascular Disease

## 2013-06-23 DIAGNOSIS — I340 Nonrheumatic mitral (valve) insufficiency: Secondary | ICD-10-CM

## 2013-06-23 DIAGNOSIS — I059 Rheumatic mitral valve disease, unspecified: Secondary | ICD-10-CM | POA: Insufficient documentation

## 2013-06-23 DIAGNOSIS — R0989 Other specified symptoms and signs involving the circulatory and respiratory systems: Secondary | ICD-10-CM

## 2013-06-23 NOTE — Progress Notes (Signed)
Carotid Duplex Completed. Maliek Schellhorn, BS, RDMS, RVT  

## 2013-06-23 NOTE — Progress Notes (Signed)
2D Echo Performed 06/23/2013    Marygrace Drought, RCS

## 2013-06-29 ENCOUNTER — Telehealth: Payer: Self-pay | Admitting: Cardiovascular Disease

## 2013-06-29 NOTE — Telephone Encounter (Signed)
Forwarded to kathryn 

## 2013-06-29 NOTE — Telephone Encounter (Signed)
Patient notified of carotid doppler results.  

## 2013-06-29 NOTE — Telephone Encounter (Signed)
He would like his doppler results from 06-23-13 please.

## 2013-06-30 ENCOUNTER — Inpatient Hospital Stay (HOSPITAL_COMMUNITY)
Admission: EM | Admit: 2013-06-30 | Discharge: 2013-07-01 | DRG: 378 | Disposition: A | Discharge status: Home / Self Care | Payer: Medicare Other | Attending: Internal Medicine | Admitting: Internal Medicine

## 2013-06-30 ENCOUNTER — Encounter (HOSPITAL_COMMUNITY): Payer: Self-pay | Admitting: Emergency Medicine

## 2013-06-30 DIAGNOSIS — I2510 Atherosclerotic heart disease of native coronary artery without angina pectoris: Secondary | ICD-10-CM | POA: Diagnosis present

## 2013-06-30 DIAGNOSIS — I340 Nonrheumatic mitral (valve) insufficiency: Secondary | ICD-10-CM

## 2013-06-30 DIAGNOSIS — D649 Anemia, unspecified: Secondary | ICD-10-CM

## 2013-06-30 DIAGNOSIS — N2889 Other specified disorders of kidney and ureter: Secondary | ICD-10-CM

## 2013-06-30 DIAGNOSIS — D62 Acute posthemorrhagic anemia: Secondary | ICD-10-CM

## 2013-06-30 DIAGNOSIS — I059 Rheumatic mitral valve disease, unspecified: Secondary | ICD-10-CM

## 2013-06-30 DIAGNOSIS — M1000 Idiopathic gout, unspecified site: Secondary | ICD-10-CM

## 2013-06-30 DIAGNOSIS — E785 Hyperlipidemia, unspecified: Secondary | ICD-10-CM

## 2013-06-30 DIAGNOSIS — D500 Iron deficiency anemia secondary to blood loss (chronic): Secondary | ICD-10-CM

## 2013-06-30 DIAGNOSIS — N183 Chronic kidney disease, stage 3 unspecified: Secondary | ICD-10-CM | POA: Diagnosis present

## 2013-06-30 DIAGNOSIS — K219 Gastro-esophageal reflux disease without esophagitis: Secondary | ICD-10-CM | POA: Diagnosis present

## 2013-06-30 DIAGNOSIS — Z96659 Presence of unspecified artificial knee joint: Secondary | ICD-10-CM

## 2013-06-30 DIAGNOSIS — Z87891 Personal history of nicotine dependence: Secondary | ICD-10-CM

## 2013-06-30 DIAGNOSIS — M129 Arthropathy, unspecified: Secondary | ICD-10-CM | POA: Diagnosis present

## 2013-06-30 DIAGNOSIS — J4489 Other specified chronic obstructive pulmonary disease: Secondary | ICD-10-CM | POA: Diagnosis present

## 2013-06-30 DIAGNOSIS — K5521 Angiodysplasia of colon with hemorrhage: Principal | ICD-10-CM

## 2013-06-30 DIAGNOSIS — I10 Essential (primary) hypertension: Secondary | ICD-10-CM

## 2013-06-30 DIAGNOSIS — K922 Gastrointestinal hemorrhage, unspecified: Secondary | ICD-10-CM | POA: Diagnosis present

## 2013-06-30 DIAGNOSIS — Q2733 Arteriovenous malformation of digestive system vessel: Secondary | ICD-10-CM

## 2013-06-30 DIAGNOSIS — K625 Hemorrhage of anus and rectum: Secondary | ICD-10-CM

## 2013-06-30 DIAGNOSIS — N179 Acute kidney failure, unspecified: Secondary | ICD-10-CM

## 2013-06-30 DIAGNOSIS — I25118 Atherosclerotic heart disease of native coronary artery with other forms of angina pectoris: Secondary | ICD-10-CM

## 2013-06-30 DIAGNOSIS — M109 Gout, unspecified: Secondary | ICD-10-CM | POA: Diagnosis present

## 2013-06-30 DIAGNOSIS — N400 Enlarged prostate without lower urinary tract symptoms: Secondary | ICD-10-CM | POA: Diagnosis present

## 2013-06-30 DIAGNOSIS — I129 Hypertensive chronic kidney disease with stage 1 through stage 4 chronic kidney disease, or unspecified chronic kidney disease: Secondary | ICD-10-CM | POA: Diagnosis present

## 2013-06-30 DIAGNOSIS — J449 Chronic obstructive pulmonary disease, unspecified: Secondary | ICD-10-CM | POA: Diagnosis present

## 2013-06-30 DIAGNOSIS — R0600 Dyspnea, unspecified: Secondary | ICD-10-CM

## 2013-06-30 LAB — CBC WITH DIFFERENTIAL/PLATELET
Basophils Absolute: 0 10*3/uL (ref 0.0–0.1)
Basophils Relative: 0 % (ref 0–1)
Eosinophils Absolute: 0.5 10*3/uL (ref 0.0–0.7)
Eosinophils Relative: 6 % — ABNORMAL HIGH (ref 0–5)
HCT: 19.5 % — ABNORMAL LOW (ref 39.0–52.0)
Hemoglobin: 6 g/dL — CL (ref 13.0–17.0)
Lymphocytes Relative: 21 % (ref 12–46)
Lymphs Abs: 1.6 10*3/uL (ref 0.7–4.0)
MCH: 25.2 pg — ABNORMAL LOW (ref 26.0–34.0)
MCHC: 30.8 g/dL (ref 30.0–36.0)
MCV: 81.9 fL (ref 78.0–100.0)
MONOS PCT: 8 % (ref 3–12)
Monocytes Absolute: 0.6 10*3/uL (ref 0.1–1.0)
NEUTROS PCT: 65 % (ref 43–77)
Neutro Abs: 4.8 10*3/uL (ref 1.7–7.7)
PLATELETS: 265 10*3/uL (ref 150–400)
RBC: 2.38 MIL/uL — ABNORMAL LOW (ref 4.22–5.81)
RDW: 15.6 % — AB (ref 11.5–15.5)
WBC: 7.5 10*3/uL (ref 4.0–10.5)

## 2013-06-30 LAB — URINALYSIS, ROUTINE W REFLEX MICROSCOPIC
BILIRUBIN URINE: NEGATIVE
GLUCOSE, UA: NEGATIVE mg/dL
HGB URINE DIPSTICK: NEGATIVE
Ketones, ur: NEGATIVE mg/dL
Leukocytes, UA: NEGATIVE
Nitrite: NEGATIVE
PROTEIN: NEGATIVE mg/dL
Specific Gravity, Urine: 1.012 (ref 1.005–1.030)
Urobilinogen, UA: 0.2 mg/dL (ref 0.0–1.0)
pH: 5.5 (ref 5.0–8.0)

## 2013-06-30 LAB — COMPREHENSIVE METABOLIC PANEL
ALT: 6 U/L (ref 0–53)
AST: 17 U/L (ref 0–37)
Albumin: 4 g/dL (ref 3.5–5.2)
Alkaline Phosphatase: 118 U/L — ABNORMAL HIGH (ref 39–117)
BUN: 52 mg/dL — ABNORMAL HIGH (ref 6–23)
CHLORIDE: 103 meq/L (ref 96–112)
CO2: 16 mEq/L — ABNORMAL LOW (ref 19–32)
CREATININE: 2.43 mg/dL — AB (ref 0.50–1.35)
Calcium: 9.1 mg/dL (ref 8.4–10.5)
GFR calc Af Amer: 26 mL/min — ABNORMAL LOW (ref >90)
GFR calc non Af Amer: 23 mL/min — ABNORMAL LOW (ref >90)
Glucose, Bld: 165 mg/dL — ABNORMAL HIGH (ref 70–99)
Potassium: 5 mEq/L (ref 3.7–5.3)
SODIUM: 135 meq/L — AB (ref 137–147)
Total Bilirubin: 0.2 mg/dL — ABNORMAL LOW (ref 0.3–1.2)
Total Protein: 7.6 g/dL (ref 6.0–8.3)

## 2013-06-30 LAB — PREPARE RBC (CROSSMATCH)

## 2013-06-30 LAB — PROTIME-INR
INR: 1.09 (ref 0.00–1.49)
Prothrombin Time: 14.1 seconds (ref 11.6–15.2)

## 2013-06-30 LAB — FIBRINOGEN: Fibrinogen: 424 mg/dL (ref 204–475)

## 2013-06-30 LAB — APTT: aPTT: 29 seconds (ref 24–37)

## 2013-06-30 LAB — ABO/RH: ABO/RH(D): O NEG

## 2013-06-30 LAB — POC OCCULT BLOOD, ED: Fecal Occult Bld: POSITIVE — AB

## 2013-06-30 MED ORDER — POLYETHYLENE GLYCOL 3350 17 G PO PACK
17.0000 g | PACK | Freq: Every day | ORAL | Status: DC | PRN
  Filled 2013-06-30: qty 1

## 2013-06-30 MED ORDER — ONDANSETRON HCL 4 MG PO TABS: 4.0000 mg | ORAL_TABLET | Freq: Four times a day (QID) | ORAL | Status: DC | PRN

## 2013-06-30 MED ORDER — SODIUM BICARBONATE 650 MG PO TABS
650.0000 mg | ORAL_TABLET | Freq: Two times a day (BID) | ORAL | Status: DC
  Administered 2013-06-30: 650 mg via ORAL
  Filled 2013-06-30 (×3): qty 1

## 2013-06-30 MED ORDER — ONDANSETRON HCL 4 MG/2ML IJ SOLN: 4.0000 mg | Freq: Four times a day (QID) | INTRAMUSCULAR | Status: DC | PRN

## 2013-06-30 MED ORDER — ATORVASTATIN CALCIUM 10 MG PO TABS
10.0000 mg | ORAL_TABLET | Freq: Every day | ORAL | Status: DC
  Administered 2013-07-01: 10 mg via ORAL
  Filled 2013-06-30 (×2): qty 1

## 2013-06-30 MED ORDER — SODIUM CHLORIDE 0.9 % IV SOLN
INTRAVENOUS | Status: AC
  Administered 2013-06-30: 22:00:00 via INTRAVENOUS

## 2013-06-30 MED ORDER — ACETAMINOPHEN 325 MG PO TABS: 650.0000 mg | ORAL_TABLET | Freq: Four times a day (QID) | ORAL | Status: DC | PRN

## 2013-06-30 MED ORDER — HYDRALAZINE HCL 20 MG/ML IJ SOLN
10.0000 mg | Freq: Four times a day (QID) | INTRAMUSCULAR | Status: DC | PRN
  Filled 2013-06-30 (×2): qty 0.5

## 2013-06-30 MED ORDER — FUROSEMIDE 10 MG/ML IJ SOLN
20.0000 mg | Freq: Once | INTRAMUSCULAR | Status: AC
  Administered 2013-07-01: 20 mg via INTRAVENOUS
  Filled 2013-06-30: qty 2

## 2013-06-30 MED ORDER — DILTIAZEM HCL ER COATED BEADS 300 MG PO CP24
300.0000 mg | ORAL_CAPSULE | Freq: Every day | ORAL | Status: DC
  Administered 2013-07-01: 300 mg via ORAL
  Filled 2013-06-30: qty 1

## 2013-06-30 MED ORDER — ACETAMINOPHEN 650 MG RE SUPP: 650.0000 mg | Freq: Four times a day (QID) | RECTAL | Status: DC | PRN

## 2013-06-30 MED ORDER — PANTOPRAZOLE SODIUM 40 MG PO TBEC
40.0000 mg | DELAYED_RELEASE_TABLET | Freq: Every day | ORAL | Status: DC
  Filled 2013-06-30: qty 1

## 2013-06-30 MED ORDER — ALLOPURINOL 150 MG HALF TABLET
150.0000 mg | ORAL_TABLET | Freq: Every day | ORAL | Status: DC
  Administered 2013-07-01: 150 mg via ORAL
  Filled 2013-06-30: qty 1

## 2013-06-30 NOTE — ED Notes (Signed)
Per pt, states he has been increasingly fatigued since April-saw PCP today and HgB was 5-has not noticed any bleeding

## 2013-06-30 NOTE — ED Provider Notes (Signed)
CSN: [id number]     Arrival date & time 06/30/13  1632 History   First MD Initiated Contact with Patient 06/30/13 1641     Chief Complaint  Patient presents with  . Fatigue     (Consider location/radiation/quality/duration/timing/severity/associated sxs/prior Treatment) HPI Comments: Patient is an 80 year old male with history of coronary artery disease, hypertension, COPD, hyperlipidemia, mitral regurgitation, AVM of colon, chronic kidney disease who presents today with 3 weeks of gradually worsening, debilitating fatigue. He states that this began around the same time that his lisinopril was increased. He decreased his Lisinopril on his own to see if this would help his fatigue. It did not affect his fatigue. He reports that he has worsening dyspnea on exertion. His wife notes that he has been more pale. He does not believe that his stools have become darker, but states "I don't know from looking at it the right way". He has had low hemoglobin in the past, but nothing that has required blood transfusion. His gastroenterologist is Dr. Benson Norway.  The history is provided by the patient. No language interpreter was used.    Past Medical History  Diagnosis Date  . Coronary artery disease   . Hypertension   . Heart murmur   . COPD (chronic obstructive pulmonary disease)   . Shortness of breath   . Arthritis   . PONV (postoperative nausea and vomiting)   . Hyperlipidemia   . Mitral regurgitation   . AV (angiodysplasia malformation of colon)   . Gout   . Benign prostatic hypertrophy   . Mitral valve prolapse   . Anemia     low hemoglobin  . Chronic kidney disease     elevated Cr, BUN, Dr. Hassell Done manages   Past Surgical History  Procedure Laterality Date  . Lobectomy  1980    left lower lobectomy for benign tumor  . Replacement total knee  2005    right  . Hernia repair  1999&2000    bilateral  . Tee without cardioversion  04/30/2011    Procedure: TRANSESOPHAGEAL ECHOCARDIOGRAM (TEE);   Surgeon: Sanda Klein, MD;  Location: [facility] ENDOSCOPY;  Service: Cardiovascular;  Laterality: N/A;  3D TEE/ patient will be admitted the day before test , therefo patient will be a inpatient the day of TEE  . Colonoscopy  05/04/2011    Procedure: COLONOSCOPY;  Surgeon: Beryle Beams, MD;  Location: [facility] ENDOSCOPY;  Service: Endoscopy;  Laterality: N/A;  . Esophagogastroduodenoscopy  05/04/2011    Procedure: ESOPHAGOGASTRODUODENOSCOPY (EGD);  Surgeon: Beryle Beams, MD;  Location: [facility] ENDOSCOPY;  Service: Endoscopy;  Laterality: N/A;  . Cataract extraction  2011    bilateral  . Cardiac catheterization      x2  . Eye surgery      bilateral cataracts removed  . Total knee arthroplasty Left 02/15/2012    Procedure: TOTAL KNEE ARTHROPLASTY;  Surgeon: Alta Corning, MD;  Location: [place];  Service: Orthopedics;  Laterality: Left;   No family history on file. History  Substance Use Topics  . Smoking status: Former Smoker -- 20 years    Quit date: 02/08/1961  . Smokeless tobacco: Former Systems developer    Quit date: 02/08/1961  . Alcohol Use: 0.0 oz/week     Comment: 4ozs Scotch daily    Review of Systems  Constitutional: Positive for fatigue. Negative for fever and chills.  Respiratory: Positive for shortness of breath (DOE).   Cardiovascular: Negative for chest pain.  Gastrointestinal: Negative for nausea, vomiting, abdominal pain and diarrhea.  Skin: Positive for pallor.  All other systems reviewed and are negative.     Allergies  Review of patient's allergies indicates no known allergies.  Home Medications   Prior to Admission medications   Medication Sig Start Date End Date Taking? Authorizing Jahlani Lorentz  allopurinol (ZYLOPRIM) 300 MG tablet Take 300 mg by mouth daily.    Historical Rhythm Wigfall, MD  aspirin EC 81 MG tablet Take 81 mg by mouth daily.    Historical Makyah Lavigne, MD  atorvastatin (LIPITOR) 10 MG tablet Take 1 tablet (10 mg total) by mouth daily. 05/13/13   Lorretta Harp, MD  diltiazem  (TIAZAC) 300 MG 24 hr capsule Take 300 mg by mouth daily.    Historical Judd Mccubbin, MD  lisinopril (PRINIVIL,ZESTRIL) 40 MG tablet Take 40 mg by mouth daily.    Historical Adyline Huberty, MD  omeprazole (PRILOSEC) 20 MG capsule Take 20 mg by mouth every other day.    Historical Joshuan Bolander, MD   BP 180/45  Pulse 74  Temp(Src) 98.2 F (36.8 C) (Oral)  Resp 16  SpO2 100% Physical Exam  Nursing note and vitals reviewed. Constitutional: He is oriented to person, place, and time. He appears well-developed and well-nourished. No distress.  HENT:  Head: Normocephalic and atraumatic.  Right Ear: External ear normal.  Left Ear: External ear normal.  Nose: Nose normal.  Eyes: Conjunctivae are normal.  Neck: Normal range of motion. No tracheal deviation present.  Cardiovascular: Normal rate, regular rhythm and normal heart sounds.   Pulmonary/Chest: Effort normal and breath sounds normal. No stridor.  Abdominal: Soft. He exhibits no distension. There is no tenderness.  Genitourinary: Rectal exam shows no mass and anal tone normal. Guaiac positive stool.  Dark brown stool seen on finger during DRE  Musculoskeletal: Normal range of motion.  Neurological: He is alert and oriented to person, place, and time.  Skin: Skin is warm and dry. He is not diaphoretic. There is pallor.  Psychiatric: He has a normal mood and affect. His behavior is normal.    ED Course  Procedures (including critical care time) Labs Review Labs Reviewed  CBC WITH DIFFERENTIAL - Abnormal; Notable for the following:    RBC 2.38 (*)    Hemoglobin 6.0 (*)    HCT 19.5 (*)    MCH 25.2 (*)    RDW 15.6 (*)    Eosinophils Relative 6 (*)    All other components within normal limits  COMPREHENSIVE METABOLIC PANEL - Abnormal; Notable for the following:    Sodium 135 (*)    CO2 16 (*)    Glucose, Bld 165 (*)    BUN 52 (*)    Creatinine, Ser 2.43 (*)    Alkaline Phosphatase 118 (*)    Total Bilirubin 0.2 (*)    GFR calc non Af Amer  23 (*)    GFR calc Af Amer 26 (*)    All other components within normal limits  POC OCCULT BLOOD, ED - Abnormal; Notable for the following:    Fecal Occult Bld POSITIVE (*)    All other components within normal limits  PROTIME-INR  APTT  FIBRINOGEN  OCCULT BLOOD X 1 CARD TO LAB, STOOL  TYPE AND SCREEN  PREPARE RBC (CROSSMATCH)  ABO/RH    Imaging Review No results found.   EKG Interpretation None      MDM   Final diagnoses:  Symptomatic anemia  AKI (acute kidney injury)  Rectal bleeding   Patient presents to ED after being told by his PCP  that his hemoglobin was 5.4. Here Hgb is 6.0. Patient also with AKI. Patient has been ordered 2 units of blood. Dr. Benson Norway has evaluated patient and will do a colonoscopy as an outpatient. He believes this is a slower bleed. Will admit to medicine for transfusion and management of AKI. Patient is hemodynamically stable. Discussed case with Dr. Kathrynn Humble who agrees with plan. Patient / Family / Caregiver informed of clinical course, understand medical decision-making process, and agree with plan.    Elwyn Lade, PA-C 06/30/13 2007

## 2013-06-30 NOTE — ED Notes (Signed)
Bed: WA14 Expected date:  Expected time:  Means of arrival:  Comments: Hold for triage 1 

## 2013-06-30 NOTE — ED Notes (Signed)
Pt sent by PCP office for anemia.  Hgb 5.4.  C/o SOB and fatigue.

## 2013-06-30 NOTE — ED Notes (Signed)
CRITICAL VALUE ALERT  Critical value received:  Hemoglobin 6.0 g/dL Date of notification: 06/30/2013 Time of notification: 2094 Critical value read back:Yes.   Nurse who received alert: Renita Papa, RN Jahaan Vanwagner notified: Cleatrice Burke, PA Time of notification: 74, face-to-face Primary RN notified: Dennison Bulla at [phone], face-to-face

## 2013-06-30 NOTE — Consult Note (Signed)
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  His symptoms startyed 2-3 weeks prior and it progressively worsened.  No reports of chest pain, but he does have DOE.  In the ER his HGB was in the 6 range, but his PCP found that his HGB was at 5.6 g/dL.  His wife noted that he was more pale in appearance.  On 05/04/2011 he underwent an EGD/Colonoscopy with findings of a small nonbleeding duodenal AVM, which was ablated.  In the cecum, approximately 4 nonbleeding AVMs were found and ablated.  Since that time he did not have any further bleeding issues until this time.  Past Medical History  Diagnosis Date  . Coronary artery disease   . Hypertension   . Heart murmur   . COPD (chronic obstructive pulmonary disease)   . Shortness of breath   . Arthritis   . PONV (postoperative nausea and vomiting)   . Hyperlipidemia   . Mitral regurgitation   . AV (angiodysplasia malformation of colon)   . Gout   . Benign prostatic hypertrophy   . Mitral valve prolapse   . Anemia     low hemoglobin  . Chronic kidney disease     elevated Cr, BUN, Dr. Hassell Done manages    Past Surgical History  Procedure Laterality Date  . Lobectomy  1980    left lower lobectomy for benign tumor  . Replacement total knee  2005    right  . Hernia repair  1999&2000    bilateral  . Tee without cardioversion  04/30/2011    Procedure: TRANSESOPHAGEAL ECHOCARDIOGRAM (TEE);  Surgeon: Sanda Klein, MD;  Location: [facility] ENDOSCOPY;  Service: Cardiovascular;  Laterality: N/A;  3D TEE/ patient will be admitted the day before test , therefo patient will be a inpatient the day of TEE  . Colonoscopy  05/04/2011    Procedure: COLONOSCOPY;  Surgeon: Beryle Beams, MD;  Location: [facility] ENDOSCOPY;  Service: Endoscopy;   Laterality: N/A;  . Esophagogastroduodenoscopy  05/04/2011    Procedure: ESOPHAGOGASTRODUODENOSCOPY (EGD);  Surgeon: Beryle Beams, MD;  Location: [facility] ENDOSCOPY;  Service: Endoscopy;  Laterality: N/A;  . Cataract extraction  2011    bilateral  . Cardiac catheterization      x2  . Eye surgery      bilateral cataracts removed  . Total knee arthroplasty Left 02/15/2012    Procedure: TOTAL KNEE ARTHROPLASTY;  Surgeon: Alta Corning, MD;  Location: [place];  Service: Orthopedics;  Laterality: Left;    History reviewed. No pertinent family history.  Social History:  reports that he quit smoking about 52 years ago. He quit smokeless tobacco use about 52 years ago. He reports that he drinks alcohol. He reports that he does not use illicit drugs.  Allergies: No Known Allergies  Medications:  Scheduled: . [START ON 07/01/2013] allopurinol  150 mg Oral Daily  . atorvastatin  10 mg Oral Daily  . [START ON 07/01/2013] diltiazem  300 mg Oral Daily  . furosemide  20 mg Intravenous Once  . [START ON 07/01/2013] pantoprazole  40 mg Oral Daily  . sodium bicarbonate  650 mg Oral BID   Continuous: . sodium chloride 75 mL/hr at 06/30/13 2200    Results for orders placed during the hospital encounter of  06/30/13 (from the past 24 hour(s))  CBC WITH DIFFERENTIAL     Status: Abnormal   Collection Time    06/30/13  5:08 PM      Result Value Ref Range   WBC 7.5  4.0 - 10.5 K/uL   RBC 2.38 (*) 4.22 - 5.81 MIL/uL   Hemoglobin 6.0 (*) 13.0 - 17.0 g/dL   HCT 19.5 (*) 39.0 - 52.0 %   MCV 81.9  78.0 - 100.0 fL   MCH 25.2 (*) 26.0 - 34.0 pg   MCHC 30.8  30.0 - 36.0 g/dL   RDW 15.6 (*) 11.5 - 15.5 %   Platelets 265  150 - 400 K/uL   Neutrophils Relative % 65  43 - 77 %   Lymphocytes Relative 21  12 - 46 %   Monocytes Relative 8  3 - 12 %   Eosinophils Relative 6 (*) 0 - 5 %   Basophils Relative 0  0 - 1 %   Neutro Abs 4.8  1.7 - 7.7 K/uL   Lymphs Abs 1.6  0.7 - 4.0 K/uL   Monocytes Absolute 0.6  0.1 - 1.0  K/uL   Eosinophils Absolute 0.5  0.0 - 0.7 K/uL   Basophils Absolute 0.0  0.0 - 0.1 K/uL   RBC Morphology ELLIPTOCYTES    COMPREHENSIVE METABOLIC PANEL     Status: Abnormal   Collection Time    06/30/13  5:08 PM      Result Value Ref Range   Sodium 135 (*) 137 - 147 mEq/L   Potassium 5.0  3.7 - 5.3 mEq/L   Chloride 103  96 - 112 mEq/L   CO2 16 (*) 19 - 32 mEq/L   Glucose, Bld 165 (*) 70 - 99 mg/dL   BUN 52 (*) 6 - 23 mg/dL   Creatinine, Ser 2.43 (*) 0.50 - 1.35 mg/dL   Calcium 9.1  8.4 - 10.5 mg/dL   Total Protein 7.6  6.0 - 8.3 g/dL   Albumin 4.0  3.5 - 5.2 g/dL   AST 17  0 - 37 U/L   ALT 6  0 - 53 U/L   Alkaline Phosphatase 118 (*) 39 - 117 U/L   Total Bilirubin 0.2 (*) 0.3 - 1.2 mg/dL   GFR calc non Af Amer 23 (*) >90 mL/min   GFR calc Af Amer 26 (*) >90 mL/min  TYPE AND SCREEN     Status: None   Collection Time    06/30/13  5:08 PM      Result Value Ref Range   ABO/RH(D) O NEG     Antibody Screen NEG     Sample Expiration 07/03/2013     Unit Number [id number]     Blood Component Type RED CELLS,LR     Unit division 00     Status of Unit ALLOCATED     Transfusion Status OK TO TRANSFUSE     Crossmatch Result Compatible     Unit Number [id number]     Blood Component Type RED CELLS,LR     Unit division 00     Status of Unit ISSUED     Transfusion Status OK TO TRANSFUSE     Crossmatch Result Compatible    PROTIME-INR     Status: None   Collection Time    06/30/13  5:08 PM      Result Value Ref Range   Prothrombin Time 14.1  11.6 - 15.2 seconds   INR 1.09  0.00 - 1.49  APTT     Status: None   Collection Time    06/30/13  5:08 PM      Result Value Ref Range   aPTT 29  24 - 37 seconds  FIBRINOGEN     Status: None   Collection Time    06/30/13  5:08 PM      Result Value Ref Range   Fibrinogen 424  204 - 475 mg/dL  ABO/RH     Status: None   Collection Time    06/30/13  5:08 PM      Result Value Ref Range   ABO/RH(D) O NEG    POC OCCULT BLOOD, ED      Status: Abnormal   Collection Time    06/30/13  5:47 PM      Result Value Ref Range   Fecal Occult Bld POSITIVE (*) NEGATIVE  PREPARE RBC (CROSSMATCH)     Status: None   Collection Time    06/30/13  6:30 PM      Result Value Ref Range   Order Confirmation ORDER PROCESSED BY BLOOD BANK       No results found.  ROS:  As stated above in the HPI otherwise negative.  Blood pressure 156/57, pulse 64, temperature 98.4 F (36.9 C), temperature source Oral, resp. rate 18, height 6' (1.829 m), weight 174 lb 8 oz (79.153 kg), SpO2 98.00%.    PE: Gen: NAD, Alert and Oriented HEENT:  [place]/AT, EOMI Neck: Supple, no LAD Lungs: CTA Bilaterally CV: RRR without M/G/R ABM: Soft, NTND, +BS Ext: No C/C/E  Assessment/Plan: 1) Anemia. 2) Heme positive stool. 3) History of AVMs.   The patient is hemodynamically stable.  His aseline HGB ranges from the 8-11 range.  I do not have my office notes to make a comparison at this time.  It does appear that his bleeding was slow.  At this time he desires to have a follow up colonoscopy as an outpatient.  I think he can have further treatment as an outpatient as long as he has symptomatic improvement with the blood transfusions.  Plan: 1) Blood transfusions. 2) I will try to arrange for a follow up colonoscopy this coming Thursday. HUNG,PATRICK D 06/30/2013, 10:35 PM

## 2013-06-30 NOTE — H&P (Signed)
[facility] History and Physical  [name] GYI:[id number] DOB: 12-Aug-1925 DOA: 06/30/2013  Referring physician: Dr. Kathrynn Humble  PCP: [name], MD   GI: Dr. Benson Norway Renal: Dr. Marylou Flesher Cardiology: Dr. Gwenlyn Found  Chief Complaint:   HPI: [name] is a 80 y.o. male with a past medical history significant for coronary artery disease, hypertension, heart murmur (due to mitral regurgitation), hyperlipidemia, chronic kidney disease stage III (baseline creatinine 1.7), history of anemia and lower GI bleed (secondary to colonic AVMs; has required multiple transfusions in the past); came to the hospital complaining severe fatigue, debilitating weakness and being tired all the time. Patient reported his symptoms has been present for the last 3 weeks and gradually worsening. Patient endorses having some dyspnea on exertion. He denies any abdominal pain, nausea, vomiting, fever, chest pain, dysuria, hematuria, melena, hematochezia or any other acute complaints. As per patient wife at bedside she reports that the patient is looking more pale than usual. Patient went to see his primary care doctor and has CBC done demonstrating 5.6 hemoglobin. In the ED patient was found with a hemoglobin of 6.0; also with acute on chronic renal failure and positive fecal occult blood test. Patient gastroenterologist is Dr. Benson Norway (which was contacted by ED physician and given the fact of not acute overt bleeding, recommended internal medicine admission for stabilization and followup with him in the outpatient setting).    Review of Systems:  Negative except as otherwise mentioned on history of present illness.  Past Medical History  Diagnosis Date  . Coronary artery disease   . Hypertension   . Heart murmur   . COPD (chronic obstructive pulmonary disease)   . Shortness of breath   . Arthritis   . PONV (postoperative nausea and vomiting)   . Hyperlipidemia   . Mitral regurgitation   . AV  (angiodysplasia malformation of colon)   . Gout   . Benign prostatic hypertrophy   . Mitral valve prolapse   . Anemia     low hemoglobin  . Chronic kidney disease     elevated Cr, BUN, Dr. Hassell Done manages   Past Surgical History  Procedure Laterality Date  . Lobectomy  1980    left lower lobectomy for benign tumor  . Replacement total knee  2005    right  . Hernia repair  1999&2000    bilateral  . Tee without cardioversion  04/30/2011    Procedure: TRANSESOPHAGEAL ECHOCARDIOGRAM (TEE);  Surgeon: Sanda Klein, MD;  Location: [facility] ENDOSCOPY;  Service: Cardiovascular;  Laterality: N/A;  3D TEE/ patient will be admitted the day before test , therefo patient will be a inpatient the day of TEE  . Colonoscopy  05/04/2011    Procedure: COLONOSCOPY;  Surgeon: Beryle Beams, MD;  Location: [facility] ENDOSCOPY;  Service: Endoscopy;  Laterality: N/A;  . Esophagogastroduodenoscopy  05/04/2011    Procedure: ESOPHAGOGASTRODUODENOSCOPY (EGD);  Surgeon: Beryle Beams, MD;  Location: [facility] ENDOSCOPY;  Service: Endoscopy;  Laterality: N/A;  . Cataract extraction  2011    bilateral  . Cardiac catheterization      x2  . Eye surgery      bilateral cataracts removed  . Total knee arthroplasty Left 02/15/2012    Procedure: TOTAL KNEE ARTHROPLASTY;  Surgeon: Alta Corning, MD;  Location: [place];  Service: Orthopedics;  Laterality: Left;   Social History:  reports that he quit smoking about 52 years ago. He quit smokeless tobacco use about 52 years ago. He reports that he drinks alcohol.  He reports that he does not use illicit drugs.  No Known Allergies  Family history: Positive for hypertension and cholesterol problems  Prior to Admission medications   Medication Sig Start Date End Date Taking? Authorizing Yoselin Amerman  allopurinol (ZYLOPRIM) 300 MG tablet Take 150 mg by mouth daily.    Yes Historical Eldwin Volkov, MD  aspirin EC 81 MG tablet Take 81 mg by mouth daily.   Yes Historical Christyanna Mckeon, MD  atorvastatin (LIPITOR) 10  MG tablet Take 1 tablet (10 mg total) by mouth daily. 05/13/13  Yes Lorretta Harp, MD  diltiazem (CARDIZEM CD) 300 MG 24 hr capsule Take 300 mg by mouth daily.   Yes Historical Laresa Oshiro, MD  lisinopril (PRINIVIL,ZESTRIL) 40 MG tablet Take 40 mg by mouth daily.   Yes Historical Gust Eugene, MD  omeprazole (PRILOSEC) 20 MG capsule Take 20 mg by mouth every other day.   Yes Historical Gaylan Fauver, MD   Physical Exam: Filed Vitals:   06/30/13 1638  BP: 180/45  Pulse: 74  Temp: 98.2 F (36.8 C)  Resp: 16    BP 180/45  Pulse 74  Temp(Src) 98.2 F (36.8 C) (Oral)  Resp 16  SpO2 100%  General:  Appears calm and comfortable; pale, afebrile and complaining of pain really tired Eyes: PERRL, normal lids, no icterus, no nystagmus ENT: grossly normal hearing, moist mucous membranes, no erythema or exudate inside his mouth; no drainage out of his ears or nostrils Neck: no LAD, masses or thyromegaly; no JVD Cardiovascular: RRR, positive systolic ejection murmur (best auscultated around left fifth intercostal space); no rubs, no gallops. S1 and S2 appreciated Telemetry: SR, no arrhythmias  Respiratory: CTA bilaterally, no w/r/r. Normal respiratory effort.  Abdomen: soft, nontender, nondistended; positive bowel sounds   Genitourinary: (Rectal exam performed by ED physician showed no mass and anal tone normal. Guaiac positive stool. Dark brown stool seen on finger during DRE  Skin: no rash, petechia, open wounds or induration seen on exam Musculoskeletal: grossly normal tone BUE/BLE Psychiatric: grossly normal mood and affect, speech fluent and appropriate Neurologic: grossly non-focal.          Labs on Admission:  Basic Metabolic Panel:  Recent Labs Lab 06/30/13 1708  NA 135*  K 5.0  CL 103  CO2 16*  GLUCOSE 165*  BUN 52*  CREATININE 2.43*  CALCIUM 9.1   Liver Function Tests:  Recent Labs Lab 06/30/13 1708  AST 17  ALT 6  ALKPHOS 118*  BILITOT 0.2*  PROT 7.6  ALBUMIN 4.0    CBC:  Recent Labs Lab 06/30/13 1708  WBC 7.5  NEUTROABS 4.8  HGB 6.0*  HCT 19.5*  MCV 81.9  PLT 265   Radiological Exams on Admission: No results found.  EKG:  None  Assessment/Plan 1-Lower GIB and Anemia: recurrent Hx of colonic AVMs (since 12/11); has received multiples transfusions intermittently. Heme stool positive this time; but not frank BRBPR appreciated. -Patient will be admitted for transfusion and to stabilize electrolytes and renal function -GI not planning for inpatient procedures at this point, unless bleeding process worsen -Will follow hemoglobin trend   2-Mitral regurgitation, severe: recently checked by Dr. Gwenlyn Found and is stable.  3-CAD, single vessel RCA: no CP. Will continue statins and as recommended by GI, ASA resumption in 2 weeks  4-HTN (hypertension): continue cardizem. -PRN hydralazine while not receiving lisinopril -Heart healthy diet  5-acute on Chronic renal insufficiency, stage III (moderate): due to decrease perfusion with anemia and continue use of lisinopril. -will provide gentle hydration -  Will check UA -hold lisinopril  6-Hyperlipidemia: continue statins  7-Gout: Without acute flare currently -Continue Zyloprim  8-GERD: Continue PPI  DVT: SCDs     GI (Dr. Benson Norway, consulted by ED physician; recommended to stabilize and transfuse patient and he will see him as an outpatient for decisions on colonoscopy or any other intervention; unless bleeding process deteriorates)  Code Status:Full code (but will not like to be kept on life support for too long or if prognosis is gray) Family Communication: wife at bedside [facility]) Disposition Plan: inpatient, LOS > 2 midnights; med-surg bed  Time spent: 50 minutes  Barton Dubois [facility] Pager [phone]  **Disclaimer: This note may have been dictated with voice recognition software. Similar sounding words can inadvertently be transcribed and this note may contain transcription  errors which may not have been corrected upon publication of note.**

## 2013-06-30 NOTE — ED Provider Notes (Signed)
Shared service with midlevel provider. I have personally seen and examined the patient, providing direct face to face care, presenting with the chief complaint of fatigue. Physical exam findings include slightly pale appearing male , who is otherwise comfortable. Pt has dyspnea on exertion and feels quite weak. Hx of AVM. HB is < 7. Plan will be transfuse and admit. I have reviewed the nursing documentation on past medical history, family history, and social history.  CRITICAL CARE Performed by: Varney Biles   Total critical care time: 40 minutes - for symptomatic anemia and transfusion.  Critical care time was exclusive of separately billable procedures and treating other patients.  Critical care was necessary to treat or prevent imminent or life-threatening deterioration.  Critical care was time spent personally by me on the following activities: development of treatment plan with patient and/or surrogate as well as nursing, discussions with consultants, evaluation of patient's response to treatment, examination of patient, obtaining history from patient or surrogate, ordering and performing treatments and interventions, ordering and review of laboratory studies, ordering and review of radiographic studies, pulse oximetry and re-evaluation of patient's condition.    Varney Biles, MD 06/30/13 2129

## 2013-07-01 DIAGNOSIS — D649 Anemia, unspecified: Secondary | ICD-10-CM

## 2013-07-01 DIAGNOSIS — I209 Angina pectoris, unspecified: Secondary | ICD-10-CM

## 2013-07-01 DIAGNOSIS — I2510 Atherosclerotic heart disease of native coronary artery without angina pectoris: Secondary | ICD-10-CM

## 2013-07-01 LAB — CBC
HEMATOCRIT: 24 % — AB (ref 39.0–52.0)
Hemoglobin: 7.9 g/dL — ABNORMAL LOW (ref 13.0–17.0)
MCH: 27.1 pg (ref 26.0–34.0)
MCHC: 32.9 g/dL (ref 30.0–36.0)
MCV: 82.5 fL (ref 78.0–100.0)
Platelets: 215 10*3/uL (ref 150–400)
RBC: 2.91 MIL/uL — ABNORMAL LOW (ref 4.22–5.81)
RDW: 15.3 % (ref 11.5–15.5)
WBC: 6.8 10*3/uL (ref 4.0–10.5)

## 2013-07-01 LAB — BASIC METABOLIC PANEL
BUN: 47 mg/dL — AB (ref 6–23)
CO2: 17 mEq/L — ABNORMAL LOW (ref 19–32)
CREATININE: 2.06 mg/dL — AB (ref 0.50–1.35)
Calcium: 9.3 mg/dL (ref 8.4–10.5)
Chloride: 106 mEq/L (ref 96–112)
GFR calc Af Amer: 32 mL/min — ABNORMAL LOW (ref >90)
GFR, EST NON AFRICAN AMERICAN: 28 mL/min — AB (ref >90)
Glucose, Bld: 118 mg/dL — ABNORMAL HIGH (ref 70–99)
Potassium: 4.9 mEq/L (ref 3.7–5.3)
Sodium: 139 mEq/L (ref 137–147)

## 2013-07-01 LAB — MAGNESIUM: Magnesium: 1.9 mg/dL (ref 1.5–2.5)

## 2013-07-01 LAB — TSH: TSH: 3.34 u[IU]/mL (ref 0.350–4.500)

## 2013-07-01 LAB — PHOSPHORUS: Phosphorus: 4.2 mg/dL (ref 2.3–4.6)

## 2013-07-01 NOTE — Progress Notes (Signed)
DC instructions reviewed with patient and spouse. Home med rec carefully reviewed and patient verbalized understanding of which meds to take when. Patient to follow up with Dr. Benson Norway and PCP within 1 week. No Rxs given. Patient given information on anemia and instructed of signs and symtpoms and when to call MD. Patient verbalized understanding. No changes noted since am assessment. IV DC. Patient to DC home with wife. Volunteer escorted to car via wheelchair.

## 2013-07-01 NOTE — Discharge Summary (Signed)
Physician Discharge Summary  [name] BDZ:[id number] DOB: Jun 09, 1925 DOA: 06/30/2013  PCP: [name], MD  Admit date: 06/30/2013 Discharge date: 07/01/2013  Time spent: 45 minutes  Recommendations for Outpatient Follow-up:  -Will be discharged home today. -Advised to follow up with PCP in 1 week to have blood counts rechecked. -Will follow up with Dr. Benson Norway as scheduled by his office.   Discharge Diagnoses:  Principal Problem:   Anemia, recurrent Hx of colonic AVMs 12/11, transfused, heme stool neg. Active Problems:   Mitral regurgitation, severe   CAD, single vessel RCA   HTN (hypertension)   Chronic renal insufficiency, stage III (moderate)   Hyperlipidemia   GIB (gastrointestinal bleeding)   Discharge Condition: Stable and improved  Filed Weights   06/30/13 2100 07/01/13 0546  Weight: 79.153 kg (174 lb 8 oz) 79.5 kg (175 lb 4.3 oz)    History of present illness:  [name] is a 80 y.o. male with a past medical history significant for coronary artery disease, hypertension, heart murmur (due to mitral regurgitation), hyperlipidemia, chronic kidney disease stage III (baseline creatinine 1.7), history of anemia and lower GI bleed (secondary to colonic AVMs; has required multiple transfusions in the past); came to the hospital complaining severe fatigue, debilitating weakness and being tired all the time. Patient reported his symptoms has been present for the last 3 weeks and gradually worsening. Patient endorses having some dyspnea on exertion. He denies any abdominal pain, nausea, vomiting, fever, chest pain, dysuria, hematuria, melena, hematochezia or any other acute complaints. As per patient wife at bedside she reports that the patient is looking more pale than usual. Patient went to see his primary care doctor and has CBC done demonstrating 5.6 hemoglobin. In the ED patient was found with a hemoglobin of 6.0; also with acute on chronic renal failure and  positive fecal occult blood test. Patient gastroenterologist is Dr. Benson Norway (which was contacted by ED physician and given the fact of not acute overt bleeding, recommended internal medicine admission for stabilization and followup with him in the outpatient setting.   Hospital Course:   Lower GI Bleed 2/2 Colonic AVMs -Bleeding seems to have stopped. -Responded appropriately to 2 units of PRBCs. -Seen by GI, with plans for an OP colonoscopy in the near future.  Acute on Chronic Blood Loss Anemia -Hb 6.0 on admission up to 7.9 s/p 2 units of PRBCs. -No further indication for transfusion at present.  Mitral Regurgitation -Compensated. -Follows with Dr. Gwenlyn Found.  CAD -Stable; no CP. -Hold ASA for at least 2 weeks given recent GI bleed.  HTN -Well controlled. -Continue home meds.  Acute on CKD Stage III -Cr improving with IVFs and hydration. -Down to 2.0 on DC from 2.6 on admission.  GERD -Continue PPI  Procedures:  None   Consultations:  GI, Dr. Benson Norway  Discharge Instructions  Discharge Instructions   Discontinue IV    Complete by:  As directed      Increase activity slowly    Complete by:  As directed             Medication List    STOP taking these medications       aspirin EC 81 MG tablet      TAKE these medications       allopurinol 300 MG tablet  Commonly known as:  ZYLOPRIM  Take 150 mg by mouth daily.     atorvastatin 10 MG tablet  Commonly known as:  LIPITOR  Take 1  tablet (10 mg total) by mouth daily.     diltiazem 300 MG 24 hr capsule  Commonly known as:  CARDIZEM CD  Take 300 mg by mouth daily.     lisinopril 40 MG tablet  Commonly known as:  PRINIVIL,ZESTRIL  Take 40 mg by mouth daily.     omeprazole 20 MG capsule  Commonly known as:  PRILOSEC  Take 20 mg by mouth every other day.       No Known Allergies     Follow-up Information   Follow up with [facility], MD. Schedule an appointment as soon as possible for a visit  in 1 week.   Specialty:  Internal Medicine   Contact information:   [address] [place] [address] [phone]       Follow up with Beryle Beams, MD.   Specialty:  Gastroenterology   Contact information:   [address] [place] [address] [phone]        The results of significant diagnostics from this hospitalization (including imaging, microbiology, ancillary and laboratory) are listed below for reference.    Significant Diagnostic Studies: No results found.  Microbiology: No results found for this or any previous visit (from the past 240 hour(s)).   Labs: Basic Metabolic Panel:  Recent Labs Lab 06/30/13 1708 07/01/13 0757  NA 135* 139  K 5.0 4.9  CL 103 106  CO2 16* 17*  GLUCOSE 165* 118*  BUN 52* 47*  CREATININE 2.43* 2.06*  CALCIUM 9.1 9.3  MG  --  1.9  PHOS  --  4.2   Liver Function Tests:  Recent Labs Lab 06/30/13 1708  AST 17  ALT 6  ALKPHOS 118*  BILITOT 0.2*  PROT 7.6  ALBUMIN 4.0   No results found for this basename: LIPASE, AMYLASE,  in the last 168 hours No results found for this basename: AMMONIA,  in the last 168 hours CBC:  Recent Labs Lab 06/30/13 1708 07/01/13 0757  WBC 7.5 6.8  NEUTROABS 4.8  --   HGB 6.0* 7.9*  HCT 19.5* 24.0*  MCV 81.9 82.5  PLT 265 215   Cardiac Enzymes: No results found for this basename: CKTOTAL, CKMB, CKMBINDEX, TROPONINI,  in the last 168 hours BNP: BNP (last 3 results) No results found for this basename: PROBNP,  in the last 8760 hours CBG: No results found for this basename: GLUCAP,  in the last 168 hours     Signed:  Lelon Frohlich  [facility] Pager: [phone] 07/01/2013, 10:18 AM

## 2013-07-02 LAB — TYPE AND SCREEN
ABO/RH(D): O NEG
ANTIBODY SCREEN: NEGATIVE
Unit division: 0
Unit division: 0

## 2013-07-13 ENCOUNTER — Other Ambulatory Visit: Payer: Self-pay | Admitting: Gastroenterology

## 2013-07-14 ENCOUNTER — Non-Acute Institutional Stay (HOSPITAL_COMMUNITY)
Admission: AD | Admit: 2013-07-14 | Discharge: 2013-07-14 | Disposition: A | Discharge status: Home / Self Care | Payer: Medicare Other | Source: Ambulatory Visit | Attending: Gastroenterology | Admitting: Gastroenterology

## 2013-07-14 DIAGNOSIS — I129 Hypertensive chronic kidney disease with stage 1 through stage 4 chronic kidney disease, or unspecified chronic kidney disease: Secondary | ICD-10-CM | POA: Diagnosis not present

## 2013-07-14 DIAGNOSIS — D649 Anemia, unspecified: Secondary | ICD-10-CM | POA: Diagnosis present

## 2013-07-14 DIAGNOSIS — D126 Benign neoplasm of colon, unspecified: Secondary | ICD-10-CM | POA: Diagnosis not present

## 2013-07-14 DIAGNOSIS — J449 Chronic obstructive pulmonary disease, unspecified: Secondary | ICD-10-CM | POA: Diagnosis not present

## 2013-07-14 DIAGNOSIS — Z87891 Personal history of nicotine dependence: Secondary | ICD-10-CM | POA: Diagnosis not present

## 2013-07-14 DIAGNOSIS — I059 Rheumatic mitral valve disease, unspecified: Secondary | ICD-10-CM | POA: Diagnosis not present

## 2013-07-14 DIAGNOSIS — N189 Chronic kidney disease, unspecified: Secondary | ICD-10-CM | POA: Diagnosis not present

## 2013-07-14 DIAGNOSIS — K5521 Angiodysplasia of colon with hemorrhage: Secondary | ICD-10-CM | POA: Diagnosis not present

## 2013-07-14 DIAGNOSIS — E785 Hyperlipidemia, unspecified: Secondary | ICD-10-CM | POA: Diagnosis not present

## 2013-07-14 DIAGNOSIS — I2510 Atherosclerotic heart disease of native coronary artery without angina pectoris: Secondary | ICD-10-CM | POA: Diagnosis not present

## 2013-07-14 LAB — PREPARE RBC (CROSSMATCH)

## 2013-07-14 NOTE — Procedures (Signed)
Patient presents for lab blood draw for type and screen. Patient to return tomorrow 0830 AM for 2 units PRBC transfusion. Blue blood band placed on patient, notified patient do not remove blood band, patient acknowledged.

## 2013-07-14 NOTE — H&P (Signed)
[name] HPI: The patient has a history of a cecal AVM/AVMs bleed.  His HGB was noted to be low and he was transfused with a couple of units 1.5 weeks ago.  His most recent HGB in the office revealed that his HGB dropped down to 7.2 from 7.9 g./dL.  He feels better, but he is not at his baseline.  The patient is to undergo a repeat colonoscopy on Thursday for further evaluation/treatment.    Past Medical History  Diagnosis Date  . Coronary artery disease   . Hypertension   . Heart murmur   . COPD (chronic obstructive pulmonary disease)   . Shortness of breath   . Arthritis   . PONV (postoperative nausea and vomiting)   . Hyperlipidemia   . Mitral regurgitation   . AV (angiodysplasia malformation of colon)   . Gout   . Benign prostatic hypertrophy   . Mitral valve prolapse   . Anemia     low hemoglobin  . Chronic kidney disease     elevated Cr, BUN, Dr. Hassell Done manages    Past Surgical History  Procedure Laterality Date  . Lobectomy  1980    left lower lobectomy for benign tumor  . Replacement total knee  2005    right  . Hernia repair  1999&2000    bilateral  . Tee without cardioversion  04/30/2011    Procedure: TRANSESOPHAGEAL ECHOCARDIOGRAM (TEE);  Surgeon: Sanda Klein, MD;  [facility] ENDOSCOPY;  Service: Cardiovascular;  Laterality: N/A;  3D TEE/ patient will be admitted the day before test , therefo patient will be a inpatient the day of TEE  . Colonoscopy  05/04/2011    Procedure: COLONOSCOPY;  Surgeon: Beryle Beams, MD;  Location: [facility] ENDOSCOPY;  Service: Endoscopy;  Laterality: N/A;  . Esophagogastroduodenoscopy  05/04/2011    Procedure: ESOPHAGOGASTRODUODENOSCOPY (EGD);  Surgeon: Beryle Beams, MD;  Location: [facility] ENDOSCOPY;  Service: Endoscopy;  Laterality: N/A;  . Cataract extraction  2011    bilateral  . Cardiac catheterization      x2  . Eye surgery      bilateral cataracts removed  . Total knee arthroplasty Left 02/15/2012    Procedure: TOTAL KNEE  ARTHROPLASTY;  Surgeon: Alta Corning, MD;  Location: [facility];  Service: Orthopedics;  Laterality: Left;    No family history on file.  Social History:  reports that he quit smoking about 52 years ago. He quit smokeless tobacco use about 52 years ago. He reports that he drinks alcohol. He reports that he does not use illicit drugs.  Allergies: No Known Allergies  Medications: Scheduled: Continuous:  Results for orders placed during the hospital encounter of 07/14/13 (from the past 24 hour(s))  TYPE AND SCREEN     Status: None   Collection Time    07/14/13 11:49 AM      Result Value Ref Range   ABO/RH(D) O NEG     Antibody Screen NEG     Sample Expiration 07/17/2013     Unit Number [id number]     Blood Component Type RED CELLS,LR     Unit division 00     Status of Unit ALLOCATED     Transfusion Status OK TO TRANSFUSE     Crossmatch Result Compatible     Unit Number [id number]     Blood Component Type RED CELLS,LR     Unit division 00     Status of Unit ALLOCATED     Transfusion Status OK  TO TRANSFUSE     Crossmatch Result Compatible    PREPARE RBC (CROSSMATCH)     Status: None   Collection Time    07/14/13 12:00 PM      Result Value Ref Range   Order Confirmation ORDER PROCESSED BY BLOOD BANK       No results found.  ROS:  As stated above in the HPI otherwise negative.  There were no vitals taken for this visit.    PE: Not performed.    Assessment/Plan: 1) Anemia. 2) History of cecal AVMs.  Plan: 1) Transfuse two units of PRBC.  Lissandra Keil D 07/14/2013, 3:47 PM

## 2013-07-15 ENCOUNTER — Non-Acute Institutional Stay (HOSPITAL_COMMUNITY)
Admission: AD | Admit: 2013-07-15 | Discharge: 2013-07-15 | Disposition: A | Discharge status: Home / Self Care | Payer: Medicare Other | Source: Ambulatory Visit | Attending: Gastroenterology | Admitting: Gastroenterology

## 2013-07-15 DIAGNOSIS — K5521 Angiodysplasia of colon with hemorrhage: Secondary | ICD-10-CM | POA: Diagnosis not present

## 2013-07-15 NOTE — Progress Notes (Signed)
Patient came in with diagnosis of Symptomatic Anemia. Received 2 Units of Blood today, tolerated well, with no Blood Transfusion reaction noted. Discharged from [facility], ambulatory accompanied by wife.

## 2013-07-16 ENCOUNTER — Ambulatory Visit (HOSPITAL_COMMUNITY): Admit: 2013-07-16 | Payer: Self-pay | Admitting: Gastroenterology

## 2013-07-16 ENCOUNTER — Encounter (HOSPITAL_COMMUNITY): Payer: Self-pay | Admitting: *Deleted

## 2013-07-16 ENCOUNTER — Ambulatory Visit (HOSPITAL_COMMUNITY): Admission: RE | Admit: 2013-07-16 | Payer: Medicare Other | Source: Ambulatory Visit | Admitting: Gastroenterology

## 2013-07-16 ENCOUNTER — Encounter (HOSPITAL_COMMUNITY)
Admission: RE | Disposition: A | Discharge status: Home / Self Care | Payer: Self-pay | Source: Ambulatory Visit | Attending: Gastroenterology

## 2013-07-16 ENCOUNTER — Ambulatory Visit (HOSPITAL_COMMUNITY)
Admission: RE | Admit: 2013-07-16 | Discharge: 2013-07-16 | Disposition: A | Discharge status: Home / Self Care | Payer: Medicare Other | Source: Ambulatory Visit | Attending: Gastroenterology | Admitting: Gastroenterology

## 2013-07-16 ENCOUNTER — Encounter (HOSPITAL_COMMUNITY)
Admission: AD | Disposition: A | Discharge status: Home / Self Care | Payer: Self-pay | Source: Ambulatory Visit | Attending: Gastroenterology

## 2013-07-16 DIAGNOSIS — J449 Chronic obstructive pulmonary disease, unspecified: Secondary | ICD-10-CM | POA: Insufficient documentation

## 2013-07-16 DIAGNOSIS — E785 Hyperlipidemia, unspecified: Secondary | ICD-10-CM | POA: Insufficient documentation

## 2013-07-16 DIAGNOSIS — I059 Rheumatic mitral valve disease, unspecified: Secondary | ICD-10-CM | POA: Insufficient documentation

## 2013-07-16 DIAGNOSIS — I2510 Atherosclerotic heart disease of native coronary artery without angina pectoris: Secondary | ICD-10-CM | POA: Insufficient documentation

## 2013-07-16 DIAGNOSIS — N189 Chronic kidney disease, unspecified: Secondary | ICD-10-CM | POA: Insufficient documentation

## 2013-07-16 DIAGNOSIS — K5521 Angiodysplasia of colon with hemorrhage: Secondary | ICD-10-CM | POA: Diagnosis not present

## 2013-07-16 DIAGNOSIS — I129 Hypertensive chronic kidney disease with stage 1 through stage 4 chronic kidney disease, or unspecified chronic kidney disease: Secondary | ICD-10-CM | POA: Insufficient documentation

## 2013-07-16 DIAGNOSIS — D126 Benign neoplasm of colon, unspecified: Secondary | ICD-10-CM | POA: Insufficient documentation

## 2013-07-16 DIAGNOSIS — J4489 Other specified chronic obstructive pulmonary disease: Secondary | ICD-10-CM | POA: Insufficient documentation

## 2013-07-16 DIAGNOSIS — Z87891 Personal history of nicotine dependence: Secondary | ICD-10-CM | POA: Insufficient documentation

## 2013-07-16 DIAGNOSIS — D649 Anemia, unspecified: Secondary | ICD-10-CM | POA: Insufficient documentation

## 2013-07-16 HISTORY — PX: COLONOSCOPY: SHX5424

## 2013-07-16 LAB — TYPE AND SCREEN
ABO/RH(D): O NEG
ANTIBODY SCREEN: NEGATIVE
UNIT DIVISION: 0
UNIT DIVISION: 0

## 2013-07-16 LAB — CBC
HEMATOCRIT: 26.3 % — AB (ref 39.0–52.0)
HEMOGLOBIN: 8.4 g/dL — AB (ref 13.0–17.0)
MCH: 26.4 pg (ref 26.0–34.0)
MCHC: 31.9 g/dL (ref 30.0–36.0)
MCV: 82.7 fL (ref 78.0–100.0)
Platelets: 144 10*3/uL — ABNORMAL LOW (ref 150–400)
RBC: 3.18 MIL/uL — ABNORMAL LOW (ref 4.22–5.81)
RDW: 16.1 % — ABNORMAL HIGH (ref 11.5–15.5)
WBC: 4.5 10*3/uL (ref 4.0–10.5)

## 2013-07-16 SURGERY — COLONOSCOPY
Anesthesia: Moderate Sedation

## 2013-07-16 MED ORDER — FENTANYL CITRATE 0.05 MG/ML IJ SOLN
INTRAMUSCULAR | Status: DC | PRN
Start: 2013-07-16 — End: 2013-07-16
  Administered 2013-07-16: 12.5 ug via INTRAVENOUS
  Administered 2013-07-16 (×2): 25 ug via INTRAVENOUS

## 2013-07-16 MED ORDER — SODIUM CHLORIDE 0.9 % IV SOLN
INTRAVENOUS | Status: DC
  Administered 2013-07-16: 09:00:00 via INTRAVENOUS

## 2013-07-16 MED ORDER — FENTANYL CITRATE 0.05 MG/ML IJ SOLN
INTRAMUSCULAR | Status: AC
  Filled 2013-07-16: qty 2

## 2013-07-16 MED ORDER — MIDAZOLAM HCL 10 MG/2ML IJ SOLN
INTRAMUSCULAR | Status: DC | PRN
  Administered 2013-07-16 (×3): 2 mg via INTRAVENOUS
  Administered 2013-07-16: 1 mg via INTRAVENOUS

## 2013-07-16 MED ORDER — DIPHENHYDRAMINE HCL 50 MG/ML IJ SOLN
INTRAMUSCULAR | Status: AC
  Filled 2013-07-16: qty 1

## 2013-07-16 MED ORDER — MIDAZOLAM HCL 5 MG/ML IJ SOLN
INTRAMUSCULAR | Status: AC
  Filled 2013-07-16: qty 2

## 2013-07-16 NOTE — Op Note (Signed)
[facility] [place], [id number]   OPERATIVE PROCEDURE REPORT  PATIENT: [name], [name]  MR#: [id number] BIRTHDATE: 1925-06-25  GENDER: Male ENDOSCOPIST: Carol Ada, MD ASSISTANT:   Sharon Mt, Endo Technician Clemmie Krill, RN, BSN PROCEDURE DATE: 07/16/2013 PROCEDURE:   Colonoscopy with control of bleeding ASA CLASS:   Class III INDICATIONS:Anemia and melena with a history of cecal AVMs. MEDICATIONS: Versed 5 mg IV and Fentanyl 62.5 mcg IV  DESCRIPTION OF PROCEDURE:   After the risks benefits and alternatives of the procedure were thoroughly explained, informed consent was obtained.  A digital rectal exam revealed no abnormalities of the rectum.    The     endoscope was introduced through the anus  and advanced to the cecum, which was identified by both the appendix and ileocecal valve , No adverse events experienced.    The quality of the prep was good. .  The instrument was then slowly withdrawn as the colon was fully examined.     FINDINGS: In the cecum a total of 5 AVMs were identified and ablated with APC.  Two of the AVMs were classic for the appearance of the blood vessels.  One of the AVMs required hemoclipping as persistent oozing occurred despite the APC.  A moderately sized ascending colon AVM was ablated with APC.  A small 2 mm ascending colon polyp was ablated with APC.  I opted to use this method as a result of his age, comorbidities, and recent bleeding history.   Retroflexed views revealed no abnormalities.     The scope was then withdrawn from the patient and the procedure terminated.  COMPLICATIONS: There were no complications.  IMPRESSION: 1) Cecal and ascending colon AVMs s/p APC. 2) Small ascending colon polyp s/p APC.  RECOMMENDATIONS: 1) Check CBC today for his post-transfusion level. 2) Follow up in the office in two weeks.  _______________________________ eSignedCarol Ada, MD 07/16/2013  9:30 AM

## 2013-07-16 NOTE — Discharge Instructions (Signed)
Colonoscopy, Care After °These instructions give you information on caring for yourself after your procedure. Your doctor may also give you more specific instructions. Call your doctor if you have any problems or questions after your procedure. °HOME CARE °· Do not drive for 24 hours. °· Do not sign important papers or use machinery for 24 hours. °· You may shower. °· You may go back to your usual activities, but go slower for the first 24 hours. °· Take rest breaks often during the first 24 hours. °· Walk around or use warm packs on your belly (abdomen) if you have belly cramping or gas. °· Drink enough fluids to keep your pee (urine) clear or pale yellow. °· Resume your normal diet. Avoid heavy or fried foods. °· Avoid drinking alcohol for 24 hours or as told by your doctor. °· Only take medicines as told by your doctor. °If a tissue sample (biopsy) was taken during the procedure:  °· Do not take aspirin or blood thinners for 7 days, or as told by your doctor. °· Do not drink alcohol for 7 days, or as told by your doctor. °· Eat soft foods for the first 24 hours. °GET HELP IF: °You still have a small amount of blood in your poop (stool) 2-3 days after the procedure. °GET HELP RIGHT AWAY IF: °· You have more than a small amount of blood in your poop. °· You see clumps of tissue (blood clots) in your poop. °· Your belly is puffy (swollen). °· You feel sick to your stomach (nauseous) or throw up (vomit). °· You have a fever. °· You have belly pain that gets worse and medicine does not help. °MAKE SURE YOU: °· Understand these instructions. °· Will watch your condition. °· Will get help right away if you are not doing well or get worse. °Document Released: 01/20/2010 Document Revised: 12/23/2012 Document Reviewed: 08/25/2012 °ExitCare® Patient Information ©2015 [employer]. This information is not intended to replace advice given to you by your health care provider. Make sure you discuss any questions you have with  your health care provider. ° °

## 2013-07-16 NOTE — Interval H&P Note (Signed)
History and Physical Interval Note:  07/16/2013 8:37 AM  [name]  has presented today for surgery, with the diagnosis of anemia  The various methods of treatment have been discussed with the patient and family. After consideration of risks, benefits and other options for treatment, the patient has consented to  Procedure(s): COLONOSCOPY (N/A) as a surgical intervention .  The patient's history has been reviewed, patient examined, no change in status, stable for surgery.  I have reviewed the patient's chart and labs.  Questions were answered to the patient's satisfaction.     [name] D

## 2013-07-16 NOTE — H&P (View-Only) (Signed)
[name] HPI: The patient has a history of a cecal AVM/AVMs bleed.  His HGB was noted to be low and he was transfused with a couple of units 1.5 weeks ago.  His most recent HGB in the office revealed that his HGB dropped down to 7.2 from 7.9 g./dL.  He feels better, but he is not at his baseline.  The patient is to undergo a repeat colonoscopy on Thursday for further evaluation/treatment.    Past Medical History  Diagnosis Date  . Coronary artery disease   . Hypertension   . Heart murmur   . COPD (chronic obstructive pulmonary disease)   . Shortness of breath   . Arthritis   . PONV (postoperative nausea and vomiting)   . Hyperlipidemia   . Mitral regurgitation   . AV (angiodysplasia malformation of colon)   . Gout   . Benign prostatic hypertrophy   . Mitral valve prolapse   . Anemia     low hemoglobin  . Chronic kidney disease     elevated Cr, BUN, Dr. Hassell Done manages    Past Surgical History  Procedure Laterality Date  . Lobectomy  1980    left lower lobectomy for benign tumor  . Replacement total knee  2005    right  . Hernia repair  1999&2000    bilateral  . Tee without cardioversion  04/30/2011    Procedure: TRANSESOPHAGEAL ECHOCARDIOGRAM (TEE);  Surgeon: Sanda Klein, MD;  Location: [facility] ENDOSCOPY;  Service: Cardiovascular;  Laterality: N/A;  3D TEE/ patient will be admitted the day before test , therefo patient will be a inpatient the day of TEE  . Colonoscopy  05/04/2011    Procedure: COLONOSCOPY;  Surgeon: Beryle Beams, MD;  Location: [facility] ENDOSCOPY;  Service: Endoscopy;  Laterality: N/A;  . Esophagogastroduodenoscopy  05/04/2011    Procedure: ESOPHAGOGASTRODUODENOSCOPY (EGD);  Surgeon: Beryle Beams, MD;  Location: [facility] ENDOSCOPY;  Service: Endoscopy;  Laterality: N/A;  . Cataract extraction  2011    bilateral  . Cardiac catheterization      x2  . Eye surgery      bilateral cataracts removed  . Total knee arthroplasty Left 02/15/2012    Procedure: TOTAL KNEE  ARTHROPLASTY;  Surgeon: Alta Corning, MD;  Location: [facility];  Service: Orthopedics;  Laterality: Left;    No family history on file.  Social History:  reports that he quit smoking about 52 years ago. He quit smokeless tobacco use about 52 years ago. He reports that he drinks alcohol. He reports that he does not use illicit drugs.  Allergies: No Known Allergies  Medications: Scheduled: Continuous:  Results for orders placed during the hospital encounter of 07/14/13 (from the past 24 hour(s))  TYPE AND SCREEN     Status: None   Collection Time    07/14/13 11:49 AM      Result Value Ref Range   ABO/RH(D) O NEG     Antibody Screen NEG     Sample Expiration 07/17/2013     Unit Number [id number]     Blood Component Type RED CELLS,LR     Unit division 00     Status of Unit ALLOCATED     Transfusion Status OK TO TRANSFUSE     Crossmatch Result Compatible     Unit Number [id number]     Blood Component Type RED CELLS,LR     Unit division 00     Status of Unit ALLOCATED     Transfusion Status OK  TO TRANSFUSE     Crossmatch Result Compatible    PREPARE RBC (CROSSMATCH)     Status: None   Collection Time    07/14/13 12:00 PM      Result Value Ref Range   Order Confirmation ORDER PROCESSED BY BLOOD BANK       No results found.  ROS:  As stated above in the HPI otherwise negative.  There were no vitals taken for this visit.    PE: Not performed.    Assessment/Plan: 1) Anemia. 2) History of cecal AVMs.  Plan: 1) Transfuse two units of PRBC.  [name] D 07/14/2013, 3:47 PM

## 2013-07-17 ENCOUNTER — Encounter (HOSPITAL_COMMUNITY): Payer: Self-pay | Admitting: Gastroenterology

## 2013-07-21 NOTE — Discharge Summary (Signed)
Patient with anemia secondary to bleeding from cecal AVMs.  He was transfused with 2 units of PRBC and clinically he felt much better.  He was discharged home without any issues.

## 2013-08-25 ENCOUNTER — Other Ambulatory Visit: Payer: Self-pay

## 2013-08-25 MED ORDER — ATORVASTATIN CALCIUM 10 MG PO TABS: 10.0000 mg | ORAL_TABLET | Freq: Every day | ORAL | Status: DC

## 2013-08-25 NOTE — Telephone Encounter (Signed)
Rx was sent to pharmacy electronically. 

## 2013-12-10 ENCOUNTER — Encounter (HOSPITAL_COMMUNITY): Payer: Self-pay | Admitting: Cardiovascular Disease

## 2014-01-14 ENCOUNTER — Encounter (HOSPITAL_COMMUNITY): Payer: Self-pay | Admitting: Gastroenterology

## 2014-01-19 ENCOUNTER — Encounter (HOSPITAL_COMMUNITY): Payer: Self-pay | Admitting: Emergency Medicine

## 2014-01-19 ENCOUNTER — Emergency Department (HOSPITAL_COMMUNITY): Payer: PPO

## 2014-01-19 ENCOUNTER — Emergency Department (HOSPITAL_COMMUNITY)
Admission: EM | Admit: 2014-01-19 | Discharge: 2014-01-19 | Disposition: A | Discharge status: Home / Self Care | Payer: PPO | Attending: Emergency Medicine | Admitting: Emergency Medicine

## 2014-01-19 DIAGNOSIS — Z79899 Other long term (current) drug therapy: Secondary | ICD-10-CM | POA: Insufficient documentation

## 2014-01-19 DIAGNOSIS — N189 Chronic kidney disease, unspecified: Secondary | ICD-10-CM | POA: Diagnosis not present

## 2014-01-19 DIAGNOSIS — D649 Anemia, unspecified: Secondary | ICD-10-CM | POA: Insufficient documentation

## 2014-01-19 DIAGNOSIS — I129 Hypertensive chronic kidney disease with stage 1 through stage 4 chronic kidney disease, or unspecified chronic kidney disease: Secondary | ICD-10-CM | POA: Diagnosis not present

## 2014-01-19 DIAGNOSIS — E785 Hyperlipidemia, unspecified: Secondary | ICD-10-CM | POA: Diagnosis not present

## 2014-01-19 DIAGNOSIS — R011 Cardiac murmur, unspecified: Secondary | ICD-10-CM | POA: Diagnosis not present

## 2014-01-19 DIAGNOSIS — J189 Pneumonia, unspecified organism: Secondary | ICD-10-CM

## 2014-01-19 DIAGNOSIS — Z87891 Personal history of nicotine dependence: Secondary | ICD-10-CM | POA: Diagnosis not present

## 2014-01-19 DIAGNOSIS — M1990 Unspecified osteoarthritis, unspecified site: Secondary | ICD-10-CM | POA: Diagnosis not present

## 2014-01-19 DIAGNOSIS — J449 Chronic obstructive pulmonary disease, unspecified: Secondary | ICD-10-CM | POA: Diagnosis not present

## 2014-01-19 DIAGNOSIS — R042 Hemoptysis: Secondary | ICD-10-CM | POA: Diagnosis not present

## 2014-01-19 DIAGNOSIS — Z87438 Personal history of other diseases of male genital organs: Secondary | ICD-10-CM | POA: Insufficient documentation

## 2014-01-19 DIAGNOSIS — R05 Cough: Secondary | ICD-10-CM

## 2014-01-19 DIAGNOSIS — Z7982 Long term (current) use of aspirin: Secondary | ICD-10-CM | POA: Insufficient documentation

## 2014-01-19 DIAGNOSIS — M109 Gout, unspecified: Secondary | ICD-10-CM | POA: Insufficient documentation

## 2014-01-19 DIAGNOSIS — Z9889 Other specified postprocedural states: Secondary | ICD-10-CM | POA: Insufficient documentation

## 2014-01-19 DIAGNOSIS — J159 Unspecified bacterial pneumonia: Secondary | ICD-10-CM | POA: Diagnosis not present

## 2014-01-19 DIAGNOSIS — R059 Cough, unspecified: Secondary | ICD-10-CM

## 2014-01-19 DIAGNOSIS — R5383 Other fatigue: Secondary | ICD-10-CM | POA: Diagnosis present

## 2014-01-19 LAB — BASIC METABOLIC PANEL
ANION GAP: 6 (ref 5–15)
BUN: 53 mg/dL — ABNORMAL HIGH (ref 6–23)
CALCIUM: 8.9 mg/dL (ref 8.4–10.5)
CO2: 17 mmol/L — ABNORMAL LOW (ref 19–32)
Chloride: 112 mEq/L (ref 96–112)
Creatinine, Ser: 1.98 mg/dL — ABNORMAL HIGH (ref 0.50–1.35)
GFR calc Af Amer: 33 mL/min — ABNORMAL LOW (ref >90)
GFR calc non Af Amer: 29 mL/min — ABNORMAL LOW (ref >90)
Glucose, Bld: 155 mg/dL — ABNORMAL HIGH (ref 70–99)
Potassium: 4.1 mmol/L (ref 3.5–5.1)
Sodium: 135 mmol/L (ref 135–145)

## 2014-01-19 LAB — CBC WITH DIFFERENTIAL/PLATELET
Basophils Absolute: 0 10*3/uL (ref 0.0–0.1)
Basophils Relative: 0 % (ref 0–1)
Eosinophils Absolute: 0 10*3/uL (ref 0.0–0.7)
Eosinophils Relative: 0 % (ref 0–5)
HCT: 27.4 % — ABNORMAL LOW (ref 39.0–52.0)
Hemoglobin: 9 g/dL — ABNORMAL LOW (ref 13.0–17.0)
LYMPHS ABS: 0.3 10*3/uL — AB (ref 0.7–4.0)
Lymphocytes Relative: 2 % — ABNORMAL LOW (ref 12–46)
MCH: 31.5 pg (ref 26.0–34.0)
MCHC: 32.8 g/dL (ref 30.0–36.0)
MCV: 95.8 fL (ref 78.0–100.0)
MONO ABS: 1.1 10*3/uL — AB (ref 0.1–1.0)
MONOS PCT: 9 % (ref 3–12)
NEUTROS PCT: 89 % — AB (ref 43–77)
Neutro Abs: 10.9 10*3/uL — ABNORMAL HIGH (ref 1.7–7.7)
PLATELETS: 171 10*3/uL (ref 150–400)
RBC: 2.86 MIL/uL — ABNORMAL LOW (ref 4.22–5.81)
RDW: 13.9 % (ref 11.5–15.5)
WBC: 12.2 10*3/uL — AB (ref 4.0–10.5)

## 2014-01-19 LAB — POC OCCULT BLOOD, ED: Fecal Occult Bld: POSITIVE — AB

## 2014-01-19 MED ORDER — SODIUM CHLORIDE 0.9 % IV BOLUS (SEPSIS)
500.0000 mL | Freq: Once | INTRAVENOUS | Status: AC
  Administered 2014-01-19: 500 mL via INTRAVENOUS

## 2014-01-19 MED ORDER — LEVOFLOXACIN 750 MG PO TABS: 750.0000 mg | ORAL_TABLET | Freq: Every day | ORAL | Status: DC

## 2014-01-19 NOTE — ED Notes (Signed)
MD at bedside. 

## 2014-01-19 NOTE — Discharge Instructions (Signed)
Take antibiotics as discussed.  If you were given medicines take as directed.  If you are on coumadin or contraceptives realize their levels and effectiveness is altered by many different medicines.  If you have any reaction (rash, tongues swelling, other) to the medicines stop taking and see a physician.   Please follow up as directed and return to the ER or see a physician for new or worsening symptoms.  Thank you. Filed Vitals:   01/19/14 0756 01/19/14 1102  BP: 177/49 150/53  Pulse: 84 84  Temp: 97.8 F (36.6 C)   TempSrc: Oral   Resp: 20 18  SpO2: 93% 97%

## 2014-01-19 NOTE — ED Provider Notes (Signed)
CSN: [id number]     Arrival date & time 01/19/14  4580 History   First MD Initiated Contact with Patient 01/19/14 0754     Chief Complaint  Patient presents with  . Anemia  . Sore Throat  . Fatigue     (Consider location/radiation/quality/duration/timing/severity/associated sxs/prior Treatment) HPI Comments: 81 rolled male with history of mitral regurg, CAD, high blood pressure, lipids, osteoarthritis, admission end of June for AV malformation bleeding and blood transfusion followed by Dr. Benson Norway presents for fatigue, cough and sore throat. Patient had an upper respiratory infection and was treated with antibiotics and supportive care with primary Dr. 2 weeks prior that went away. Recently since it today he has had mild productive cough, sore throat and general fatigue. Patient had darker stools than normal unsure if blood.  Patient is a 81 y.o. male presenting with anemia and pharyngitis. The history is provided by the patient.  Anemia Pertinent negatives include no chest pain, no abdominal pain, no headaches and no shortness of breath.  Sore Throat Pertinent negatives include no chest pain, no abdominal pain, no headaches and no shortness of breath.    Past Medical History  Diagnosis Date  . Coronary artery disease   . Hypertension   . Heart murmur   . COPD (chronic obstructive pulmonary disease)   . Shortness of breath   . Arthritis   . PONV (postoperative nausea and vomiting)   . Hyperlipidemia   . Mitral regurgitation   . AV (angiodysplasia malformation of colon)   . Gout   . Benign prostatic hypertrophy   . Mitral valve prolapse   . Anemia     low hemoglobin  . Chronic kidney disease     elevated Cr, BUN, Dr. Hassell Done manages   Past Surgical History  Procedure Laterality Date  . Lobectomy  1980    left lower lobectomy for benign tumor  . Replacement total knee  2005    right  . Hernia repair  1999&2000    bilateral  . Tee without cardioversion  04/30/2011    Procedure:  TRANSESOPHAGEAL ECHOCARDIOGRAM (TEE);  Surgeon: Sanda Klein, MD;  Location: [facility] ENDOSCOPY;  Service: Cardiovascular;  Laterality: N/A;  3D TEE/ patient will be admitted the day before test , therefo patient will be a inpatient the day of TEE  . Colonoscopy  05/04/2011    Procedure: COLONOSCOPY;  Surgeon: Beryle Beams, MD;  Location: [facility] ENDOSCOPY;  Service: Endoscopy;  Laterality: N/A;  . Esophagogastroduodenoscopy  05/04/2011    Procedure: ESOPHAGOGASTRODUODENOSCOPY (EGD);  Surgeon: Beryle Beams, MD;  Location: [facility] ENDOSCOPY;  Service: Endoscopy;  Laterality: N/A;  . Cataract extraction  2011    bilateral  . Cardiac catheterization      x2  . Eye surgery      bilateral cataracts removed  . Total knee arthroplasty Left 02/15/2012    Procedure: TOTAL KNEE ARTHROPLASTY;  Surgeon: Alta Corning, MD;  Location: [facility];  Service: Orthopedics;  Laterality: Left;  . Colonoscopy N/A 07/16/2013    Procedure: COLONOSCOPY;  Surgeon: Beryle Beams, MD;  Location: [place];  Service: Endoscopy;  Laterality: N/A;  . Coronary angiogram  04/30/2011    Procedure: CORONARY ANGIOGRAM;  Surgeon: Lorretta Harp, MD;  Location: [facility] CATH LAB;  Service: Cardiovascular;;  . Right heart catheterization  04/30/2011    Procedure: RIGHT HEART CATH;  Surgeon: Lorretta Harp, MD;  Location: [facility] CATH LAB;  Service: Cardiovascular;;  . Abdominal angiogram  04/30/2011  Procedure: ABDOMINAL ANGIOGRAM;  Surgeon: Lorretta Harp, MD;  Location: [facility] CATH LAB;  Service: Cardiovascular;;   History reviewed. No pertinent family history. History  Substance Use Topics  . Smoking status: Former Smoker -- 20 years    Quit date: 02/08/1961  . Smokeless tobacco: Former Systems developer    Quit date: 02/08/1961  . Alcohol Use: 0.0 oz/week     Comment: 2ozs Scotch daily    Review of Systems  Constitutional: Positive for fatigue. Negative for fever and chills.  HENT: Positive for congestion.   Eyes: Negative for visual disturbance.   Respiratory: Positive for cough. Negative for shortness of breath.   Cardiovascular: Negative for chest pain.  Gastrointestinal: Negative for vomiting and abdominal pain.  Genitourinary: Negative for dysuria and flank pain.  Musculoskeletal: Negative for back pain, neck pain and neck stiffness.  Skin: Negative for rash.  Neurological: Negative for light-headedness and headaches.      Allergies  Review of patient's allergies indicates no known allergies.  Home Medications   Prior to Admission medications   Medication Sig Start Date End Date Taking? Authorizing Provider  allopurinol (ZYLOPRIM) 300 MG tablet Take 150 mg by mouth daily.    Yes Historical Provider, MD  aspirin EC 81 MG tablet Take 81 mg by mouth daily.   Yes Historical Provider, MD  atorvastatin (LIPITOR) 10 MG tablet Take 1 tablet (10 mg total) by mouth daily. 08/25/13  Yes Lorretta Harp, MD  diltiazem (CARDIZEM CD) 300 MG 24 hr capsule Take 300 mg by mouth daily.   Yes Historical Provider, MD  lisinopril (PRINIVIL,ZESTRIL) 40 MG tablet Take 40 mg by mouth daily.   Yes Historical Provider, MD  omeprazole (PRILOSEC OTC) 20 MG tablet Take 20 mg by mouth every other day.   Yes Historical Provider, MD  levofloxacin (LEVAQUIN) 750 MG tablet Take 1 tablet (750 mg total) by mouth daily. X 7 days 01/19/14   Mariea Clonts, MD   BP 150/53 mmHg  Pulse 84  Temp(Src) 97.8 F (36.6 C) (Oral)  Resp 18  SpO2 97% Physical Exam  Constitutional: He is oriented to person, place, and time. He appears well-developed and well-nourished.  HENT:  Head: Normocephalic and atraumatic.  Mild erythema posterior pharynx no exudate or swelling.  Eyes: Conjunctivae are normal. Right eye exhibits no discharge. Left eye exhibits no discharge.  Neck: Normal range of motion. Neck supple. No tracheal deviation present.  Cardiovascular: Normal rate and regular rhythm.   Pulmonary/Chest: Effort normal. He has rales.  Rales on left mid and lower  lung.  Abdominal: Soft. He exhibits no distension. There is no tenderness. There is no guarding.  Musculoskeletal: He exhibits no edema.  Neurological: He is alert and oriented to person, place, and time.  Skin: Skin is warm. No rash noted.  Psychiatric: He has a normal mood and affect.  Nursing note and vitals reviewed.   ED Course  Procedures (including critical care time) Labs Review Labs Reviewed  BASIC METABOLIC PANEL - Abnormal; Notable for the following:    CO2 17 (*)    Glucose, Bld 155 (*)    BUN 53 (*)    Creatinine, Ser 1.98 (*)    GFR calc non Af Amer 29 (*)    GFR calc Af Amer 33 (*)    All other components within normal limits  CBC WITH DIFFERENTIAL - Abnormal; Notable for the following:    WBC 12.2 (*)    RBC 2.86 (*)    Hemoglobin  9.0 (*)    HCT 27.4 (*)    Neutrophils Relative % 89 (*)    Neutro Abs 10.9 (*)    Lymphocytes Relative 2 (*)    Lymphs Abs 0.3 (*)    Monocytes Absolute 1.1 (*)    All other components within normal limits  POC OCCULT BLOOD, ED - Abnormal; Notable for the following:    Fecal Occult Bld POSITIVE (*)    All other components within normal limits    Imaging Review Dg Chest 2 View  01/19/2014   CLINICAL DATA:  Cough, congestion, shortness of breath, started to cough up pink sputum at 0100 hr today, history coronary artery disease, chronic kidney disease, hypertension, former smoker  EXAM: CHEST  2 VIEW  COMPARISON:  04/24/2011; interval chest radiograph on time line from 12/31/2012 is not available  FINDINGS: Enlargement of cardiac silhouette.  Mediastinal contours and pulmonary vascularity normal.  Surgical clips at LEFT hilum, by history remote lobectomy.  Airspace infiltrate LEFT lung consistent with pneumonia.  RIGHT lung clear.  Underlying emphysematous and bronchitic changes.  No pleural effusion or pneumothorax.  Bones demineralized.  IMPRESSION: COPD changes with airspace infiltrate LEFT lung consistent with pneumonia, though a  alveolar hemorrhage could cause a similar appearance.  Enlargement of cardiac silhouette.   Electronically Signed   By: Lavonia Dana M.D.   On: 01/19/2014 08:36   Ct Chest Wo Contrast  01/19/2014   CLINICAL DATA:  Chronic cough with chest congestion and hemoptysis  EXAM: CT CHEST WITHOUT CONTRAST  TECHNIQUE: Multidetector CT imaging of the chest was performed following the standard protocol without IV contrast material administration.  COMPARISON:  Chest CT December 31, 2012 and chest radiograph January 19, 2014  FINDINGS: There is extensive airspace consolidation throughout nearly all of the left lower lobe as well as in portions of the inferior lingula and posterior segment left lower lobe. On the right, there is mild atelectatic change in the right lower lobe. There is mild patchy peripheral interstitial fibrosis in the right upper lobe anteriorly.  In the left upper lobe region.  There are several lymph nodes in the anterior mediastinum, largest measuring 1.6 by 1.0 cm. No other appreciable lymph node prominence is seen on this study.  Pericardium is not thickened.  There is a small hiatal hernia.  There is no demonstrable thoracic aortic aneurysm. There is atherosclerotic change in aorta. No major vessel pulmonary embolus is identified on this noncontrast enhanced study.  In the visualized upper abdomen, there is a cyst in the lateral segment left lobe measuring 3.7 x 8 2.7 cm. There is atherosclerotic change in the upper abdominal aorta.  There is degenerative change in the thoracic spine. There are no blastic or lytic bone lesions. Thyroid appears normal.  IMPRESSION: Extensive airspace consolidation on the left, probably due to pneumonia, although pulmonary hemorrhage could present in this manner and is a differential consideration.  Postoperative change on the left. Mild interstitial fibrosis in the anterior segment right upper lobe peripherally.  Several mildly prominent anterior mediastinal lymph nodes  on the left. No other lymph node prominence appreciable.  Cyst in left lobe liver.  Small hiatal hernia.   Electronically Signed   By: Lowella Grip M.D.   On: 01/19/2014 09:26     EKG Interpretation None      MDM   Final diagnoses:  Cough  CAP (community acquired pneumonia)  Hemoptysis  CRF (chronic renal failure), unspecified stage  Anemia, unspecified anemia type  Patient presents clinically with concern for pneumonia/viral process versus pleural effusion with rales on the left side and cough. With anemia history and fatigue plan for blood work.  X-ray reviewed concerning for pneumonia versus hemorrhage. CT scan ordered for further detail, CT results concerning for extensive pneumonia area Patient served in the ER, no episodes of vomiting or coughing up blood. Patient feels well and at baseline except for cough currently. Discussed admission/observation the hospital versus close outpatient follow-up, patient prefers to try outpatient follow-up with oral antibiotics and will call his doctor to moral. Strict reasons to return given to the patient.  Results and differential diagnosis were discussed with the patient/parent/guardian. Close follow up outpatient was discussed, comfortable with the plan.   Medications  sodium chloride 0.9 % bolus 500 mL (0 mLs Intravenous Stopped 01/19/14 0940)    Filed Vitals:   01/19/14 0756 01/19/14 1102  BP: 177/49 150/53  Pulse: 84 84  Temp: 97.8 F (36.6 C)   TempSrc: Oral   Resp: 20 18  SpO2: 93% 97%    Final diagnoses:  Cough  CAP (community acquired pneumonia)  Hemoptysis  CRF (chronic renal failure), unspecified stage  Anemia, unspecified anemia type       Mariea Clonts, MD 01/19/14 1120

## 2014-01-19 NOTE — ED Notes (Signed)
Pt states he had blood transfusion in June for anemia, Hgb in Nov was 11.0, last week pt found out his Hgb was 9.3. Pt states that last night he had a sore throat, coughed up pink mucous, chills, feels weak and tired. Pt denies night sweats, emesis, abdominal pain.

## 2014-02-05 ENCOUNTER — Ambulatory Visit (HOSPITAL_COMMUNITY)
Admission: RE | Admit: 2014-02-05 | Discharge: 2014-02-05 | Disposition: A | Discharge status: Home / Self Care | Payer: PPO | Source: Ambulatory Visit | Attending: Gastroenterology | Admitting: Gastroenterology

## 2014-02-05 DIAGNOSIS — D649 Anemia, unspecified: Secondary | ICD-10-CM | POA: Diagnosis present

## 2014-02-05 LAB — PREPARE RBC (CROSSMATCH)

## 2014-02-05 MED ORDER — SODIUM CHLORIDE 0.9 % IV SOLN: Freq: Once | INTRAVENOUS | Status: DC

## 2014-02-05 NOTE — Progress Notes (Signed)
Iv had to be restarted immediately after the first unit was hung, therefore start time was changed.

## 2014-02-06 LAB — TYPE AND SCREEN
ABO/RH(D): O NEG
Antibody Screen: NEGATIVE
Unit division: 0
Unit division: 0

## 2014-03-02 ENCOUNTER — Encounter: Payer: Self-pay | Admitting: Cardiovascular Disease

## 2014-08-09 ENCOUNTER — Encounter: Payer: Self-pay | Admitting: Cardiovascular Disease

## 2014-09-28 ENCOUNTER — Encounter: Payer: Self-pay | Admitting: Cardiovascular Disease

## 2014-10-05 ENCOUNTER — Other Ambulatory Visit: Payer: Self-pay | Admitting: *Deleted

## 2014-10-05 MED ORDER — ATORVASTATIN CALCIUM 10 MG PO TABS: 10.0000 mg | ORAL_TABLET | Freq: Every day | ORAL | Status: DC

## 2015-01-05 ENCOUNTER — Telehealth: Payer: Self-pay | Admitting: Cardiovascular Disease

## 2015-01-05 NOTE — Telephone Encounter (Signed)
Pt had cholesterol done in Sept by PCP.  Advised no need for further labwork at this time, pt voiced understanding.

## 2015-01-05 NOTE — Telephone Encounter (Signed)
If his primary care doctor follows his lipid profile then I will need no labs prior to his office as, otherwise a fasting lipid profile

## 2015-01-05 NOTE — Telephone Encounter (Signed)
No recent labs on file, routed to Dr. Gwenlyn Found to advise.

## 2015-01-05 NOTE — Telephone Encounter (Signed)
[name] is wanting to know if he needs to have lab work done before his appt on 02/04/15.. If so please mail him a lab order .  Thanks

## 2015-02-02 ENCOUNTER — Other Ambulatory Visit: Payer: Self-pay | Admitting: Cardiovascular Disease

## 2015-02-03 NOTE — Telephone Encounter (Signed)
Rx request sent to pharmacy.  

## 2015-02-04 ENCOUNTER — Encounter: Payer: Self-pay | Admitting: Cardiovascular Disease

## 2015-02-04 ENCOUNTER — Ambulatory Visit (INDEPENDENT_AMBULATORY_CARE_PROVIDER_SITE_OTHER): Payer: PRIVATE HEALTH INSURANCE | Admitting: Cardiovascular Disease

## 2015-02-04 DIAGNOSIS — I2583 Coronary atherosclerosis due to lipid rich plaque: Secondary | ICD-10-CM

## 2015-02-04 DIAGNOSIS — I340 Nonrheumatic mitral (valve) insufficiency: Secondary | ICD-10-CM

## 2015-02-04 DIAGNOSIS — I2510 Atherosclerotic heart disease of native coronary artery without angina pectoris: Secondary | ICD-10-CM

## 2015-02-04 MED ORDER — ATORVASTATIN CALCIUM 10 MG PO TABS: 10.0000 mg | ORAL_TABLET | Freq: Every day | ORAL | Status: DC

## 2015-02-04 NOTE — Assessment & Plan Note (Signed)
History of CAD status post cardiac catheterization performed by myself 04/30/11 bili of 50% proximal ramus branch stenosis and a 70% mid dominant RCA stenosis just after the takeoff of an acute marginal branch which I thought was not significant.

## 2015-02-04 NOTE — Assessment & Plan Note (Signed)
History of dyslipidemia on atorvastatin with recent lipid profile performed by his PCP September 2016 revealing total cholesterol 118, LDL of 50 and HDL of 51.

## 2015-02-04 NOTE — Patient Instructions (Signed)
Medication Instructions:  Your physician recommends that you continue on your current medications as directed. Please refer to the Current Medication list given to you today.   Labwork: none  Testing/Procedures: Your physician has requested that you have an echocardiogram. Echocardiography is a painless test that uses sound waves to create images of your heart. It provides your doctor with information about the size and shape of your heart and how well your heart's chambers and valves are working. This procedure takes approximately one hour. There are no restrictions for this procedure.    Follow-Up: Your physician wants you to follow-up in: 12 months with Dr. Berry. You will receive a reminder letter in the mail two months in advance. If you don't receive a letter, please call our office to schedule the follow-up appointment.   Any Other Special Instructions Will Be Listed Below (If Applicable).     If you need a refill on your cardiac medications before your next appointment, please call your pharmacy.   

## 2015-02-04 NOTE — Assessment & Plan Note (Signed)
History of hypertension blood pressure measured at 160/60. His blood pressures at home range in the 120-140/50 range. He is on lisinopril, and diltiazem. Continue current meds at current dosing

## 2015-02-04 NOTE — Assessment & Plan Note (Signed)
History of severe mitral regurgitation by 2-D echocardiogram as recently as 06/23/13. He had a moderately dilated left ventricle with posterior leaflet prolapse with mitral valve and moderate to severe regurg directed towards the septum. Left atrium was moderate to severely dilated and he had moderate pulmonary hypertension as well moderate PI. He is completely symptomatic. We will recheck a 2-D echocardiogram

## 2015-02-04 NOTE — Progress Notes (Signed)
02/04/2015 [name]   01-Aug-1925  JI:[id number]  Primary Physician [name], MD Primary Cardiologist: Lorretta Harp MD FACP,FACC,FAHA, FSCAI   HPI:  .The patient is a very pleasant 82 year old thin-appearing married Caucasian male, father of 2, grandfather to 2 grandchildren, who is accompanied by his wife today. I last saw himin the office 06/09/13. He has a history of noncritical CAD by catheterization performed by myself, August 15, 2009, with moderate disease in his mid RCA, LAD, and ramus branch, and normal LV function. He did have severe MR secondary to mitral valve prolapse with severe left atrial enlargement. His other problems include hypertension and hyperlipidemia. He was complaining of increasing dyspnea. I brought him in, performing right and left heart catheterization, April 30, 2011, revealing a 50% proximal ramus branch stenosis, 70% mid dominant RCA stenosis just after the takeoff of an acute marginal branch. A TEE performed by Dr. Sallyanne Kuster on April 30, 2011, revealed severe flail mid scallop of the posterior leaflet secondary to ruptured chordae. He was fairly anemic at that time and was seen by Dr. Carol Ada. He underwent upper and lower endoscopy, revealing AVMs. His hemoglobin improved with iron repletion and his symptoms resolved. He is currently asymptomatic with a hemoglobin of 12.4 as recently as 11/15/14 and excellent lipid profile at the same time revealing total cholesterol 118, LDL 50 and HDL of 51.[name] His last 2-D echocardiogram performed 06/23/13 revealed a moderately enlarged LV cavity and function with moderate to severe MR, mitral valve prolapse of the posterior leaflet. He is fairly active and works out at [employer] several times a week on the elliptical for 25 minutes as well as weight lifting.   Current Outpatient Prescriptions  Medication Sig Dispense Refill  . allopurinol (ZYLOPRIM) 300 MG tablet Take 150 mg by mouth daily.     [name] aspirin EC 81  MG tablet Take 81 mg by mouth daily.    [name] atorvastatin (LIPITOR) 10 MG tablet Take 1 tablet (10 mg total) by mouth daily at 6 PM. 90 tablet 0  . diltiazem (CARDIZEM CD) 300 MG 24 hr capsule Take 300 mg by mouth daily.    . Fe Fum-FePoly-Vit C-Vit B3 (INTEGRA) 62.5-62.5-40-3 MG CAPS Take 1 capsule by mouth daily.  5  . lisinopril (PRINIVIL,ZESTRIL) 40 MG tablet Take 40 mg by mouth daily.    [name] omeprazole (PRILOSEC OTC) 20 MG tablet Take 20 mg by mouth every other day.     No current facility-administered medications for this visit.    No Known Allergies  Social History   Social History  . Marital Status: Married    Spouse Name: N/A  . Number of Children: N/A  . Years of Education: N/A   Occupational History  . Not on file.   Social History Main Topics  . Smoking status: Former Smoker -- 20 years    Quit date: 02/08/1961  . Smokeless tobacco: Former Systems developer    Quit date: 02/08/1961  . Alcohol Use: 4.2 oz/week    7 Standard drinks or equivalent per week     Comment: 2ozs Building services engineer daily  . Drug Use: No  . Sexual Activity: Not Currently   Other Topics Concern  . Not on file   Social History Narrative     Review of Systems: General: negative for chills, fever, night sweats or weight changes.  Cardiovascular: negative for chest pain, dyspnea on exertion, edema, orthopnea, palpitations, paroxysmal nocturnal dyspnea or shortness of breath Dermatological: negative for rash  Respiratory: negative for cough or wheezing Urologic: negative for hematuria Abdominal: negative for nausea, vomiting, diarrhea, bright red blood per rectum, melena, or hematemesis Neurologic: negative for visual changes, syncope, or dizziness All other systems reviewed and are otherwise negative except as noted above.    Blood pressure 160/60, pulse 76, height 6' (1.829 m), weight 180 lb 4.8 oz (81.784 kg).  General appearance: alert and no distress Neck: no adenopathy, no JVD, supple, symmetrical, trachea  midline, thyroid not enlarged, symmetric, no tenderness/mass/nodules and soft right carotid bruit Lungs: clear to auscultation bilaterally Heart: regular rate and rhythm, S1, S2 normal, no murmur, click, rub or gallop Extremities: extremities normal, atraumatic, no cyanosis or edema  EKG sinus rhythm at 73 with nonspecific ST and T-wave changes. I personally reviewed this EKG  ASSESSMENT AND PLAN:   Mitral regurgitation, severe History of severe mitral regurgitation by 2-D echocardiogram as recently as 06/23/13. He had a moderately dilated left ventricle with posterior leaflet prolapse with mitral valve and moderate to severe regurg directed towards the septum. Left atrium was moderate to severely dilated and he had moderate pulmonary hypertension as well moderate PI. He is completely symptomatic. We will recheck a 2-D echocardiogram  CAD, single vessel RCA History of CAD status post cardiac catheterization performed by myself 04/30/11 bili of 50% proximal ramus branch stenosis and a 70% mid dominant RCA stenosis just after the takeoff of an acute marginal branch which I thought was not significant.  HTN (hypertension) History of hypertension blood pressure measured at 160/60. His blood pressures at home range in the 120-140/50 range. He is on lisinopril, and diltiazem. Continue current meds at current dosing  Dyslipidemia History of dyslipidemia on atorvastatin with recent lipid profile performed by his PCP September 2016 revealing total cholesterol 118, LDL of 50 and HDL of 51.      Lorretta Harp MD FACP,FACC,FAHA, FSCAI 02/04/2015 10:00 AM

## 2015-02-23 ENCOUNTER — Ambulatory Visit (HOSPITAL_COMMUNITY): Payer: PPO | Attending: Cardiology

## 2015-02-23 ENCOUNTER — Other Ambulatory Visit: Payer: Self-pay

## 2015-02-23 DIAGNOSIS — Z87891 Personal history of nicotine dependence: Secondary | ICD-10-CM | POA: Insufficient documentation

## 2015-02-23 DIAGNOSIS — I371 Nonrheumatic pulmonary valve insufficiency: Secondary | ICD-10-CM | POA: Diagnosis not present

## 2015-02-23 DIAGNOSIS — I340 Nonrheumatic mitral (valve) insufficiency: Secondary | ICD-10-CM | POA: Insufficient documentation

## 2015-02-23 DIAGNOSIS — I119 Hypertensive heart disease without heart failure: Secondary | ICD-10-CM | POA: Diagnosis not present

## 2015-02-23 DIAGNOSIS — E785 Hyperlipidemia, unspecified: Secondary | ICD-10-CM | POA: Diagnosis not present

## 2015-03-07 ENCOUNTER — Telehealth: Payer: Self-pay | Admitting: Cardiovascular Disease

## 2015-03-07 NOTE — Telephone Encounter (Signed)
Pt would like his echo results from 02-23-15 please.

## 2015-03-07 NOTE — Telephone Encounter (Signed)
Results reviewed w/ patient who verbalized understanding.

## 2015-03-07 NOTE — Telephone Encounter (Signed)
Routed to Dr. Gwenlyn Found to review - need result note.

## 2015-03-07 NOTE — Telephone Encounter (Signed)
Nl LV systolic fxn with mod MR and PI

## 2015-03-11 DIAGNOSIS — J310 Chronic rhinitis: Secondary | ICD-10-CM | POA: Diagnosis not present

## 2015-03-11 DIAGNOSIS — H6121 Impacted cerumen, right ear: Secondary | ICD-10-CM | POA: Diagnosis not present

## 2015-03-11 DIAGNOSIS — J342 Deviated nasal septum: Secondary | ICD-10-CM | POA: Diagnosis not present

## 2015-04-12 DIAGNOSIS — D500 Iron deficiency anemia secondary to blood loss (chronic): Secondary | ICD-10-CM | POA: Diagnosis not present

## 2015-04-12 DIAGNOSIS — E782 Mixed hyperlipidemia: Secondary | ICD-10-CM | POA: Diagnosis not present

## 2015-04-12 DIAGNOSIS — Z0000 Encounter for general adult medical examination without abnormal findings: Secondary | ICD-10-CM | POA: Diagnosis not present

## 2015-04-12 DIAGNOSIS — M1A0790 Idiopathic chronic gout, unspecified ankle and foot, without tophus (tophi): Secondary | ICD-10-CM | POA: Diagnosis not present

## 2015-04-12 DIAGNOSIS — I10 Essential (primary) hypertension: Secondary | ICD-10-CM | POA: Diagnosis not present

## 2015-04-12 DIAGNOSIS — N183 Chronic kidney disease, stage 3 (moderate): Secondary | ICD-10-CM | POA: Diagnosis not present

## 2015-04-19 DIAGNOSIS — D500 Iron deficiency anemia secondary to blood loss (chronic): Secondary | ICD-10-CM | POA: Diagnosis not present

## 2015-04-19 DIAGNOSIS — R7303 Prediabetes: Secondary | ICD-10-CM | POA: Diagnosis not present

## 2015-04-19 DIAGNOSIS — N183 Chronic kidney disease, stage 3 (moderate): Secondary | ICD-10-CM | POA: Diagnosis not present

## 2015-04-19 DIAGNOSIS — E782 Mixed hyperlipidemia: Secondary | ICD-10-CM | POA: Diagnosis not present

## 2015-05-04 DIAGNOSIS — H02102 Unspecified ectropion of right lower eyelid: Secondary | ICD-10-CM | POA: Diagnosis not present

## 2015-05-04 DIAGNOSIS — Z0100 Encounter for examination of eyes and vision without abnormal findings: Secondary | ICD-10-CM | POA: Diagnosis not present

## 2015-05-04 DIAGNOSIS — H16103 Unspecified superficial keratitis, bilateral: Secondary | ICD-10-CM | POA: Diagnosis not present

## 2015-05-04 DIAGNOSIS — H02105 Unspecified ectropion of left lower eyelid: Secondary | ICD-10-CM | POA: Diagnosis not present

## 2015-05-11 ENCOUNTER — Other Ambulatory Visit: Payer: Self-pay | Admitting: Cardiovascular Disease

## 2015-05-11 NOTE — Telephone Encounter (Signed)
Rx has been sent to the pharmacy electronically. ° °

## 2015-05-12 DIAGNOSIS — N183 Chronic kidney disease, stage 3 (moderate): Secondary | ICD-10-CM | POA: Diagnosis not present

## 2015-05-12 DIAGNOSIS — E872 Acidosis: Secondary | ICD-10-CM | POA: Diagnosis not present

## 2015-05-12 DIAGNOSIS — I10 Essential (primary) hypertension: Secondary | ICD-10-CM | POA: Diagnosis not present

## 2015-05-12 DIAGNOSIS — R809 Proteinuria, unspecified: Secondary | ICD-10-CM | POA: Diagnosis not present

## 2015-06-14 DIAGNOSIS — D500 Iron deficiency anemia secondary to blood loss (chronic): Secondary | ICD-10-CM | POA: Diagnosis not present

## 2015-06-28 DIAGNOSIS — J301 Allergic rhinitis due to pollen: Secondary | ICD-10-CM | POA: Diagnosis not present

## 2015-07-06 DIAGNOSIS — J310 Chronic rhinitis: Secondary | ICD-10-CM | POA: Diagnosis not present

## 2015-07-06 DIAGNOSIS — J018 Other acute sinusitis: Secondary | ICD-10-CM | POA: Diagnosis not present

## 2015-07-06 DIAGNOSIS — H6522 Chronic serous otitis media, left ear: Secondary | ICD-10-CM | POA: Diagnosis not present

## 2015-08-16 DIAGNOSIS — D500 Iron deficiency anemia secondary to blood loss (chronic): Secondary | ICD-10-CM | POA: Diagnosis not present

## 2015-10-20 DIAGNOSIS — R042 Hemoptysis: Secondary | ICD-10-CM | POA: Diagnosis not present

## 2015-10-25 DIAGNOSIS — D500 Iron deficiency anemia secondary to blood loss (chronic): Secondary | ICD-10-CM | POA: Diagnosis not present

## 2015-10-25 DIAGNOSIS — R7303 Prediabetes: Secondary | ICD-10-CM | POA: Diagnosis not present

## 2015-10-25 DIAGNOSIS — E782 Mixed hyperlipidemia: Secondary | ICD-10-CM | POA: Diagnosis not present

## 2015-10-25 DIAGNOSIS — N183 Chronic kidney disease, stage 3 (moderate): Secondary | ICD-10-CM | POA: Diagnosis not present

## 2015-11-01 DIAGNOSIS — D500 Iron deficiency anemia secondary to blood loss (chronic): Secondary | ICD-10-CM | POA: Diagnosis not present

## 2015-11-01 DIAGNOSIS — N183 Chronic kidney disease, stage 3 (moderate): Secondary | ICD-10-CM | POA: Diagnosis not present

## 2015-11-01 DIAGNOSIS — R7303 Prediabetes: Secondary | ICD-10-CM | POA: Diagnosis not present

## 2015-11-01 DIAGNOSIS — Z23 Encounter for immunization: Secondary | ICD-10-CM | POA: Diagnosis not present

## 2015-11-01 DIAGNOSIS — E782 Mixed hyperlipidemia: Secondary | ICD-10-CM | POA: Diagnosis not present

## 2015-11-30 ENCOUNTER — Telehealth: Payer: Self-pay | Admitting: Cardiovascular Disease

## 2015-11-30 NOTE — Telephone Encounter (Signed)
I see lab scanned into chart so no lab is needed, pt notified

## 2015-11-30 NOTE — Telephone Encounter (Signed)
Pt wants to know if he need lab work before he comes for his appointment on 02-08-16? He said he had lab work at his primary doctor in October of this year.

## 2016-01-17 DIAGNOSIS — D500 Iron deficiency anemia secondary to blood loss (chronic): Secondary | ICD-10-CM | POA: Diagnosis not present

## 2016-02-03 ENCOUNTER — Other Ambulatory Visit: Payer: Self-pay | Admitting: Cardiovascular Disease

## 2016-02-03 ENCOUNTER — Telehealth: Payer: Self-pay | Admitting: *Deleted

## 2016-02-03 DIAGNOSIS — I340 Nonrheumatic mitral (valve) insufficiency: Secondary | ICD-10-CM

## 2016-02-03 NOTE — Telephone Encounter (Signed)
Called and left message for patient to call and schedule ECHO ordered by Dr. Gwenlyn Found (hopefully prior to 02/08/16 visit)

## 2016-02-06 ENCOUNTER — Telehealth (HOSPITAL_COMMUNITY): Payer: Self-pay | Admitting: Cardiovascular Disease

## 2016-02-08 ENCOUNTER — Ambulatory Visit (INDEPENDENT_AMBULATORY_CARE_PROVIDER_SITE_OTHER): Payer: PPO | Admitting: Cardiovascular Disease

## 2016-02-08 ENCOUNTER — Encounter: Payer: Self-pay | Admitting: Cardiovascular Disease

## 2016-02-08 VITALS — BP 162/50 | HR 68 | Ht 74.5 in | Wt 177.6 lb

## 2016-02-08 DIAGNOSIS — I340 Nonrheumatic mitral (valve) insufficiency: Secondary | ICD-10-CM

## 2016-02-08 DIAGNOSIS — E785 Hyperlipidemia, unspecified: Secondary | ICD-10-CM

## 2016-02-08 DIAGNOSIS — I2510 Atherosclerotic heart disease of native coronary artery without angina pectoris: Secondary | ICD-10-CM | POA: Diagnosis not present

## 2016-02-08 DIAGNOSIS — I10 Essential (primary) hypertension: Secondary | ICD-10-CM | POA: Diagnosis not present

## 2016-02-08 NOTE — Assessment & Plan Note (Signed)
History of dyslipidemia on statin therapy with recent lipid profile performed by his PCP 11/02/15 revealed total cholesterol 122, LDL of 62 and HDL 45.

## 2016-02-08 NOTE — Assessment & Plan Note (Signed)
History of CAD status post cardiac catheterization by myself 08/15/09 and again 04/30/11 with 70% mid dominant RCA stenosis. He is currently asymptomatic.

## 2016-02-08 NOTE — Assessment & Plan Note (Signed)
History of hypertension with blood pressure measured today at 162/50 although at home his blood pressure measurements are much better than this was still systolic blood pressures in the 01/22[birth year]1928 range. He is on diltiazem and lisinopril. Continue current meds at current dosing

## 2016-02-08 NOTE — Assessment & Plan Note (Signed)
History of moderate to severe mitral regurgitation from mitral valve prolapse by 2-D echo most recently performed 02/21/15 with preserved LV function. He is asymptomatic. He is scheduled to have a follow-up echo in the next several weeks.

## 2016-02-08 NOTE — Patient Instructions (Signed)

## 2016-02-08 NOTE — Progress Notes (Signed)
02/08/2016 [name]   [birth year]  [id number]  Primary Physician [name], MD Primary Cardiologist: Lorretta Harp MD Lupe Carney, [place]  HPI:  The patient is a very pleasant 83 year old thin-appearing married Caucasian male, father of 2, grandfather to 2 grandchildren, who I last saw office 02/04/15.. I last saw himin the office 06/09/13. He has a history of noncritical CAD by catheterization performed by myself, August 15, 2009, with moderate disease in his mid RCA, LAD, and ramus branch, and normal LV function. He did have severe MR secondary to mitral valve prolapse with severe left atrial enlargement. His other problems include hypertension and hyperlipidemia. He was complaining of increasing dyspnea. I brought him in, performing right and left heart catheterization, April 30, 2011, revealing a 50% proximal ramus branch stenosis, 70% mid dominant RCA stenosis just after the takeoff of an acute marginal branch. A TEE performed by Dr. Sallyanne Kuster on April 30, 2011, revealed severe flail mid scallop of the posterior leaflet secondary to ruptured chordae. He was fairly anemic at that time and was seen by Dr. Carol Ada. He underwent upper and lower endoscopy, revealing AVMs. His hemoglobin improved with iron repletion and his symptoms resolved. He is currently asymptomatic with a hemoglobin of 12.4 as recently as 11/15/14 's most recent lipid profile performed by his PCP 11/02/15 revealed total cholesterol 122, LDL 62 and HDL of 45.[name] His last 2-D echocardiogram performed 02/23/15 revealed normal LV size and function with moderate mitral regurgitation directed anteriorly. He is fairly active and works out at [employer] several times a week on the elliptical for 25 minutes as well as weight lifting.    Current Outpatient Prescriptions  Medication Sig Dispense Refill  . allopurinol (ZYLOPRIM) 300 MG tablet Take 150 mg by mouth daily.     [name] aspirin EC 81 MG tablet Take 81 mg by mouth  daily.    [name] atorvastatin (LIPITOR) 10 MG tablet Take 1 tablet (10 mg total) by mouth daily at 6 PM. 90 tablet 0  . diltiazem (CARDIZEM CD) 300 MG 24 hr capsule Take 300 mg by mouth daily.    [name] lisinopril (PRINIVIL,ZESTRIL) 40 MG tablet Take 40 mg by mouth daily.    [name] omeprazole (PRILOSEC OTC) 20 MG tablet Take 20 mg by mouth every other day.     No current facility-administered medications for this visit.     No Known Allergies  Social History   Social History  . Marital status: Married    Spouse name: N/A  . Number of children: N/A  . Years of education: N/A   Occupational History  . Not on file.   Social History Main Topics  . Smoking status: Former Smoker    Years: 20.00    Quit date: 02/08/1961  . Smokeless tobacco: Former Systems developer    Quit date: 02/08/1961  . Alcohol use 4.2 oz/week    7 Standard drinks or equivalent per week     Comment: 2ozs Building services engineer daily  . Drug use: No  . Sexual activity: Not Currently   Other Topics Concern  . Not on file   Social History Narrative  . No narrative on file     Review of Systems: General: negative for chills, fever, night sweats or weight changes.  Cardiovascular: negative for chest pain, dyspnea on exertion, edema, orthopnea, palpitations, paroxysmal nocturnal dyspnea or shortness of breath Dermatological: negative for rash Respiratory: negative for cough or wheezing Urologic: negative for hematuria Abdominal: negative for nausea,  vomiting, diarrhea, bright red blood per rectum, melena, or hematemesis Neurologic: negative for visual changes, syncope, or dizziness All other systems reviewed and are otherwise negative except as noted above.    Blood pressure (!) 162/50, pulse 68, height 6' 2.5" (1.892 m), weight 177 lb 9.6 oz (80.6 kg).  General appearance: alert and no distress Neck: no adenopathy, no carotid bruit, no JVD, supple, symmetrical, trachea midline and thyroid not enlarged, symmetric, no tenderness/mass/nodules Lungs:  clear to auscultation bilaterally Heart: regular rate and rhythm, S1, S2 normal, no murmur, click, rub or gallop Extremities: extremities normal, atraumatic, no cyanosis or edema  EKG sinus rhythm at 68 with poor R-wave progression and left axis deviation. I personally reviewed this EKG.  ASSESSMENT AND PLAN:   Mitral regurgitation, severe History of moderate to severe mitral regurgitation from mitral valve prolapse by 2-D echo most recently performed 02/21/15 with preserved LV function. He is asymptomatic. He is scheduled to have a follow-up echo in the next several weeks.  CAD, single vessel RCA History of CAD status post cardiac catheterization by myself 08/15/09 and again 04/30/11 with 70% mid dominant RCA stenosis. He is currently asymptomatic.  HTN (hypertension) History of hypertension with blood pressure measured today at 162/50 although at home his blood pressure measurements are much better than this was still systolic blood pressures in the 01/22/1928 range. He is on diltiazem and lisinopril. Continue current meds at current dosing  Dyslipidemia History of dyslipidemia on statin therapy with recent lipid profile performed by his PCP 11/02/15 revealed total cholesterol 122, LDL of 62 and HDL 45.      Lorretta Harp MD FACP,FACC,FAHA, [facility] 02/08/2016 2:26 PM

## 2016-02-10 NOTE — Telephone Encounter (Signed)
  02/06/2016 11:40 AM Phone ([place]) [place], [place] B (Self) [phone] (H)   Left Message - Called pt and lmsg for him to CB in regards to scheduling an echo    By Verdene Rio

## 2016-02-22 ENCOUNTER — Other Ambulatory Visit: Payer: Self-pay

## 2016-02-22 ENCOUNTER — Ambulatory Visit (HOSPITAL_COMMUNITY): Payer: PPO | Attending: Internal Medicine

## 2016-02-22 DIAGNOSIS — I340 Nonrheumatic mitral (valve) insufficiency: Secondary | ICD-10-CM | POA: Diagnosis not present

## 2016-02-22 DIAGNOSIS — I081 Rheumatic disorders of both mitral and tricuspid valves: Secondary | ICD-10-CM | POA: Insufficient documentation

## 2016-03-20 DIAGNOSIS — L570 Actinic keratosis: Secondary | ICD-10-CM | POA: Diagnosis not present

## 2016-03-20 DIAGNOSIS — X32XXXD Exposure to sunlight, subsequent encounter: Secondary | ICD-10-CM | POA: Diagnosis not present

## 2016-03-20 DIAGNOSIS — D0471 Carcinoma in situ of skin of right lower limb, including hip: Secondary | ICD-10-CM | POA: Diagnosis not present

## 2016-03-20 DIAGNOSIS — D225 Melanocytic nevi of trunk: Secondary | ICD-10-CM | POA: Diagnosis not present

## 2016-05-01 DIAGNOSIS — Z08 Encounter for follow-up examination after completed treatment for malignant neoplasm: Secondary | ICD-10-CM | POA: Diagnosis not present

## 2016-05-01 DIAGNOSIS — X32XXXD Exposure to sunlight, subsequent encounter: Secondary | ICD-10-CM | POA: Diagnosis not present

## 2016-05-01 DIAGNOSIS — Z85828 Personal history of other malignant neoplasm of skin: Secondary | ICD-10-CM | POA: Diagnosis not present

## 2016-05-01 DIAGNOSIS — L570 Actinic keratosis: Secondary | ICD-10-CM | POA: Diagnosis not present

## 2016-05-02 DIAGNOSIS — E782 Mixed hyperlipidemia: Secondary | ICD-10-CM | POA: Diagnosis not present

## 2016-05-02 DIAGNOSIS — N183 Chronic kidney disease, stage 3 (moderate): Secondary | ICD-10-CM | POA: Diagnosis not present

## 2016-05-02 DIAGNOSIS — R7303 Prediabetes: Secondary | ICD-10-CM | POA: Diagnosis not present

## 2016-05-02 DIAGNOSIS — Z0000 Encounter for general adult medical examination without abnormal findings: Secondary | ICD-10-CM | POA: Diagnosis not present

## 2016-05-08 DIAGNOSIS — I129 Hypertensive chronic kidney disease with stage 1 through stage 4 chronic kidney disease, or unspecified chronic kidney disease: Secondary | ICD-10-CM | POA: Diagnosis not present

## 2016-05-08 DIAGNOSIS — Z23 Encounter for immunization: Secondary | ICD-10-CM | POA: Diagnosis not present

## 2016-05-08 DIAGNOSIS — N183 Chronic kidney disease, stage 3 (moderate): Secondary | ICD-10-CM | POA: Diagnosis not present

## 2016-05-08 DIAGNOSIS — R7303 Prediabetes: Secondary | ICD-10-CM | POA: Diagnosis not present

## 2016-05-08 DIAGNOSIS — E782 Mixed hyperlipidemia: Secondary | ICD-10-CM | POA: Diagnosis not present

## 2016-05-10 ENCOUNTER — Other Ambulatory Visit: Payer: Self-pay | Admitting: *Deleted

## 2016-05-10 MED ORDER — ATORVASTATIN CALCIUM 10 MG PO TABS: 10.0000 mg | ORAL_TABLET | Freq: Every day | ORAL | 3 refills | Status: DC

## 2016-05-17 DIAGNOSIS — H02102 Unspecified ectropion of right lower eyelid: Secondary | ICD-10-CM | POA: Diagnosis not present

## 2016-05-17 DIAGNOSIS — H02105 Unspecified ectropion of left lower eyelid: Secondary | ICD-10-CM | POA: Diagnosis not present

## 2016-05-17 DIAGNOSIS — H16103 Unspecified superficial keratitis, bilateral: Secondary | ICD-10-CM | POA: Diagnosis not present

## 2016-05-17 DIAGNOSIS — H52203 Unspecified astigmatism, bilateral: Secondary | ICD-10-CM | POA: Diagnosis not present

## 2016-06-01 DIAGNOSIS — I10 Essential (primary) hypertension: Secondary | ICD-10-CM | POA: Diagnosis not present

## 2016-06-01 DIAGNOSIS — E872 Acidosis: Secondary | ICD-10-CM | POA: Diagnosis not present

## 2016-06-01 DIAGNOSIS — N183 Chronic kidney disease, stage 3 (moderate): Secondary | ICD-10-CM | POA: Diagnosis not present

## 2016-06-01 DIAGNOSIS — R809 Proteinuria, unspecified: Secondary | ICD-10-CM | POA: Diagnosis not present

## 2016-06-01 DIAGNOSIS — N400 Enlarged prostate without lower urinary tract symptoms: Secondary | ICD-10-CM | POA: Diagnosis not present

## 2016-07-27 DIAGNOSIS — N401 Enlarged prostate with lower urinary tract symptoms: Secondary | ICD-10-CM | POA: Diagnosis not present

## 2016-07-27 DIAGNOSIS — R972 Elevated prostate specific antigen [PSA]: Secondary | ICD-10-CM | POA: Diagnosis not present

## 2016-07-27 DIAGNOSIS — R351 Nocturia: Secondary | ICD-10-CM | POA: Diagnosis not present

## 2016-08-14 DIAGNOSIS — D500 Iron deficiency anemia secondary to blood loss (chronic): Secondary | ICD-10-CM | POA: Diagnosis not present

## 2016-08-22 DIAGNOSIS — D500 Iron deficiency anemia secondary to blood loss (chronic): Secondary | ICD-10-CM | POA: Diagnosis not present

## 2016-08-28 DIAGNOSIS — D500 Iron deficiency anemia secondary to blood loss (chronic): Secondary | ICD-10-CM | POA: Diagnosis not present

## 2016-09-06 DIAGNOSIS — D500 Iron deficiency anemia secondary to blood loss (chronic): Secondary | ICD-10-CM | POA: Diagnosis not present

## 2016-09-19 DIAGNOSIS — D500 Iron deficiency anemia secondary to blood loss (chronic): Secondary | ICD-10-CM | POA: Diagnosis not present

## 2016-10-04 DIAGNOSIS — D500 Iron deficiency anemia secondary to blood loss (chronic): Secondary | ICD-10-CM | POA: Diagnosis not present

## 2016-10-24 DIAGNOSIS — X32XXXD Exposure to sunlight, subsequent encounter: Secondary | ICD-10-CM | POA: Diagnosis not present

## 2016-10-24 DIAGNOSIS — L570 Actinic keratosis: Secondary | ICD-10-CM | POA: Diagnosis not present

## 2016-10-24 DIAGNOSIS — L820 Inflamed seborrheic keratosis: Secondary | ICD-10-CM | POA: Diagnosis not present

## 2016-10-24 DIAGNOSIS — D0439 Carcinoma in situ of skin of other parts of face: Secondary | ICD-10-CM | POA: Diagnosis not present

## 2016-11-07 DIAGNOSIS — D500 Iron deficiency anemia secondary to blood loss (chronic): Secondary | ICD-10-CM | POA: Diagnosis not present

## 2016-11-07 DIAGNOSIS — R7303 Prediabetes: Secondary | ICD-10-CM | POA: Diagnosis not present

## 2016-11-14 DIAGNOSIS — R7303 Prediabetes: Secondary | ICD-10-CM | POA: Diagnosis not present

## 2016-11-14 DIAGNOSIS — I10 Essential (primary) hypertension: Secondary | ICD-10-CM | POA: Diagnosis not present

## 2016-11-14 DIAGNOSIS — Z23 Encounter for immunization: Secondary | ICD-10-CM | POA: Diagnosis not present

## 2016-11-14 DIAGNOSIS — N400 Enlarged prostate without lower urinary tract symptoms: Secondary | ICD-10-CM | POA: Diagnosis not present

## 2016-11-14 DIAGNOSIS — E782 Mixed hyperlipidemia: Secondary | ICD-10-CM | POA: Diagnosis not present

## 2016-11-14 DIAGNOSIS — D500 Iron deficiency anemia secondary to blood loss (chronic): Secondary | ICD-10-CM | POA: Diagnosis not present

## 2016-11-14 DIAGNOSIS — N183 Chronic kidney disease, stage 3 (moderate): Secondary | ICD-10-CM | POA: Diagnosis not present

## 2016-11-28 DIAGNOSIS — Z08 Encounter for follow-up examination after completed treatment for malignant neoplasm: Secondary | ICD-10-CM | POA: Diagnosis not present

## 2016-11-28 DIAGNOSIS — Z85828 Personal history of other malignant neoplasm of skin: Secondary | ICD-10-CM | POA: Diagnosis not present

## 2017-01-16 DIAGNOSIS — D500 Iron deficiency anemia secondary to blood loss (chronic): Secondary | ICD-10-CM | POA: Diagnosis not present

## 2017-02-03 DIAGNOSIS — J069 Acute upper respiratory infection, unspecified: Secondary | ICD-10-CM | POA: Diagnosis not present

## 2017-02-06 DIAGNOSIS — J399 Disease of upper respiratory tract, unspecified: Secondary | ICD-10-CM | POA: Diagnosis not present

## 2017-02-06 DIAGNOSIS — J069 Acute upper respiratory infection, unspecified: Secondary | ICD-10-CM | POA: Diagnosis not present

## 2017-02-11 ENCOUNTER — Emergency Department (HOSPITAL_COMMUNITY)
Admission: EM | Admit: 2017-02-11 | Discharge: 2017-02-11 | Disposition: A | Discharge status: Home / Self Care | Payer: PPO | Attending: Emergency Medicine | Admitting: Emergency Medicine

## 2017-02-11 ENCOUNTER — Emergency Department (HOSPITAL_COMMUNITY): Payer: PPO

## 2017-02-11 ENCOUNTER — Encounter (HOSPITAL_COMMUNITY): Payer: Self-pay

## 2017-02-11 DIAGNOSIS — J449 Chronic obstructive pulmonary disease, unspecified: Secondary | ICD-10-CM | POA: Insufficient documentation

## 2017-02-11 DIAGNOSIS — N183 Chronic kidney disease, stage 3 (moderate): Secondary | ICD-10-CM | POA: Diagnosis not present

## 2017-02-11 DIAGNOSIS — J111 Influenza due to unidentified influenza virus with other respiratory manifestations: Secondary | ICD-10-CM | POA: Diagnosis not present

## 2017-02-11 DIAGNOSIS — Z87891 Personal history of nicotine dependence: Secondary | ICD-10-CM | POA: Insufficient documentation

## 2017-02-11 DIAGNOSIS — R05 Cough: Secondary | ICD-10-CM | POA: Diagnosis not present

## 2017-02-11 DIAGNOSIS — Z96653 Presence of artificial knee joint, bilateral: Secondary | ICD-10-CM | POA: Insufficient documentation

## 2017-02-11 DIAGNOSIS — I129 Hypertensive chronic kidney disease with stage 1 through stage 4 chronic kidney disease, or unspecified chronic kidney disease: Secondary | ICD-10-CM | POA: Diagnosis not present

## 2017-02-11 DIAGNOSIS — I2510 Atherosclerotic heart disease of native coronary artery without angina pectoris: Secondary | ICD-10-CM | POA: Insufficient documentation

## 2017-02-11 DIAGNOSIS — Z79899 Other long term (current) drug therapy: Secondary | ICD-10-CM | POA: Insufficient documentation

## 2017-02-11 DIAGNOSIS — Z7982 Long term (current) use of aspirin: Secondary | ICD-10-CM | POA: Insufficient documentation

## 2017-02-11 DIAGNOSIS — J1100 Influenza due to unidentified influenza virus with unspecified type of pneumonia: Secondary | ICD-10-CM | POA: Diagnosis not present

## 2017-02-11 DIAGNOSIS — J189 Pneumonia, unspecified organism: Secondary | ICD-10-CM | POA: Diagnosis not present

## 2017-02-11 DIAGNOSIS — R531 Weakness: Secondary | ICD-10-CM | POA: Diagnosis present

## 2017-02-11 LAB — CBC WITH DIFFERENTIAL/PLATELET
BASOS PCT: 0 %
Basophils Absolute: 0 10*3/uL (ref 0.0–0.1)
EOS ABS: 0 10*3/uL (ref 0.0–0.7)
Eosinophils Relative: 0 %
HCT: 32.4 % — ABNORMAL LOW (ref 39.0–52.0)
Hemoglobin: 11.5 g/dL — ABNORMAL LOW (ref 13.0–17.0)
Lymphocytes Relative: 9 %
Lymphs Abs: 0.7 10*3/uL (ref 0.7–4.0)
MCH: 32.7 pg (ref 26.0–34.0)
MCHC: 35.5 g/dL (ref 30.0–36.0)
MCV: 92 fL (ref 78.0–100.0)
MONOS PCT: 8 %
Monocytes Absolute: 0.6 10*3/uL (ref 0.1–1.0)
Neutro Abs: 6.4 10*3/uL (ref 1.7–7.7)
Neutrophils Relative %: 83 %
PLATELETS: 232 10*3/uL (ref 150–400)
RBC: 3.52 MIL/uL — ABNORMAL LOW (ref 4.22–5.81)
RDW: 13.1 % (ref 11.5–15.5)
WBC: 7.7 10*3/uL (ref 4.0–10.5)

## 2017-02-11 LAB — BASIC METABOLIC PANEL
Anion gap: 13 (ref 5–15)
BUN: 22 mg/dL — ABNORMAL HIGH (ref 6–20)
CALCIUM: 8.5 mg/dL — AB (ref 8.9–10.3)
CO2: 19 mmol/L — AB (ref 22–32)
CREATININE: 1.49 mg/dL — AB (ref 0.61–1.24)
Chloride: 92 mmol/L — ABNORMAL LOW (ref 101–111)
GFR calc non Af Amer: 40 mL/min — ABNORMAL LOW (ref >60)
GFR, EST AFRICAN AMERICAN: 46 mL/min — AB (ref >60)
GLUCOSE: 117 mg/dL — AB (ref 65–99)
Potassium: 4.6 mmol/L (ref 3.5–5.1)
Sodium: 124 mmol/L — ABNORMAL LOW (ref 135–145)

## 2017-02-11 MED ORDER — ONDANSETRON HCL 4 MG PO TABS: 4.0000 mg | ORAL_TABLET | Freq: Every day | ORAL | 0 refills | Status: DC | PRN

## 2017-02-11 MED ORDER — DOXYCYCLINE HYCLATE 50 MG PO CAPS: 100.0000 mg | ORAL_CAPSULE | Freq: Two times a day (BID) | ORAL | 0 refills | Status: AC

## 2017-02-11 NOTE — ED Provider Notes (Signed)
[facility] DEPT Provider Note   CSN: [id number] Arrival date & time: 02/11/17  0902     History   Chief Complaint Chief Complaint  Patient presents with  . flu symptoms    HPI [name] is a 84 y.o. male with PMH of CAD, mitral valve regurgitation, HTN, BPH, and GERD presenting with generalized weakness and flu symptoms.   Patient states that 10 days ago he began having flu like symptoms: cough, rhinorrhea, nasal congestion, and body aches. He went to an urgent care and was diagnosed with the flu via nasal swab. He was given nasal sprays, cough syrup, and a z pack. He finished the z pack yesterday. The patient has felt no improvement in his symptoms. He has productive cough with green sputum, nausea, and decreased PO intake. He denies vomiting or diarrhea. He decided to come to the ED to be evaluated after feeling profoundly weak yesterday evening and this morning. He called his PCP, who instructed him to be evaluated in the ED due to his symptoms and age. He denies fevers, but endorses chills and body aches.       Past Medical History:  Diagnosis Date  . Anemia    low hemoglobin  . Arthritis   . AV (angiodysplasia malformation of colon)   . Benign prostatic hypertrophy   . Chronic kidney disease    elevated Cr, BUN, Dr. Hassell Done manages  . COPD (chronic obstructive pulmonary disease) ([place])   . Coronary artery disease   . Gout   . Heart murmur   . Hyperlipidemia   . Hypertension   . Mitral regurgitation   . Mitral valve prolapse   . PONV (postoperative nausea and vomiting)   . Shortness of breath     Patient Active Problem List   Diagnosis Date Noted  . GIB (gastrointestinal bleeding) 06/30/2013  . Postoperative anemia due to acute blood loss 02/18/2012  . Osteoarthritis of left knee 02/15/2012  . Hyperlipidemia   . Anemia, recurrent Hx of colonic AVMs 12/11, transfused, heme stool neg. 05/01/2011  . Dyspnea 04/25/2011  . Mitral  regurgitation, severe 04/25/2011  . CAD, single vessel RCA 04/25/2011  . HTN (hypertension) 04/25/2011  . Dyslipidemia 04/25/2011  . Chronic renal insufficiency, stage III (moderate) ([place]) 04/25/2011  . Gout 04/25/2011    Past Surgical History:  Procedure Laterality Date  . ABDOMINAL ANGIOGRAM  04/30/2011   Procedure: ABDOMINAL ANGIOGRAM;  Surgeon: Lorretta Harp, MD;  Location: [facility] CATH LAB;  Service: Cardiovascular;;  . CARDIAC CATHETERIZATION  04/2011   x2  . CARDIAC CATHETERIZATION  08/2009  . CATARACT EXTRACTION  2011   bilateral  . COLONOSCOPY  05/04/2011   Procedure: COLONOSCOPY;  Surgeon: Beryle Beams, MD;  Location: [facility] ENDOSCOPY;  Service: Endoscopy;  Laterality: N/A;  . COLONOSCOPY N/A 07/16/2013   Procedure: COLONOSCOPY;  Surgeon: Beryle Beams, MD;  Location: [facility];  Service: Endoscopy;  Laterality: N/A;  . CORONARY ANGIOGRAM  04/30/2011   Procedure: CORONARY ANGIOGRAM;  Surgeon: Lorretta Harp, MD;  Location[facility] CATH LAB;  Service: Cardiovascular;;  . ESOPHAGOGASTRODUODENOSCOPY  05/04/2011   Procedure: ESOPHAGOGASTRODUODENOSCOPY (EGD);  Surgeon: Beryle Beams, MD;  Location: [facility] ENDOSCOPY;  Service: Endoscopy;  Laterality: N/A;  . EYE SURGERY     bilateral cataracts removed  . HERNIA REPAIR  1999&2000   bilateral  . LOBECTOMY  1980   left lower lobectomy for benign tumor  . REPLACEMENT TOTAL KNEE  2005   right  .  RIGHT HEART CATHETERIZATION  04/30/2011   Procedure: RIGHT HEART CATH;  Surgeon: Lorretta Harp, MD;  Location: [facility] CATH LAB;  Service: Cardiovascular;;  . TEE WITHOUT CARDIOVERSION  04/30/2011   Procedure: TRANSESOPHAGEAL ECHOCARDIOGRAM (TEE);  Surgeon: Sanda Klein, MD;  Location: [facility] ENDOSCOPY;  Service: Cardiovascular;  Laterality: N/A;  3D TEE/ patient will be admitted the day before test , therefo patient will be a inpatient the day of TEE  . TOTAL KNEE ARTHROPLASTY Left 02/15/2012   Procedure: TOTAL KNEE ARTHROPLASTY;  Surgeon: Alta Corning,  MD;  Location: [facility];  Service: Orthopedics;  Laterality: Left;       Home Medications    Prior to Admission medications   Medication Sig Start Date End Date Taking? Authorizing Provider  acetaminophen (TYLENOL) 325 MG tablet Take 650 mg by mouth every 6 (six) hours as needed for mild pain or headache.   Yes [provider]  allopurinol (ZYLOPRIM) 300 MG tablet Take 150 mg by mouth daily.    Yes [provider]  aspirin EC 81 MG tablet Take 81 mg by mouth daily.   Yes [provider]  atorvastatin (LIPITOR) 10 MG tablet Take 1 tablet (10 mg total) by mouth daily at 6 PM. 05/10/16  Yes Lorretta Harp, MD  diltiazem (CARDIZEM CD) 300 MG 24 hr capsule Take 300 mg by mouth daily.   Yes [provider]  ipratropium (ATROVENT) 0.06 % nasal spray Place 2 sprays into both nostrils 4 (four) times daily. 02/03/17  Yes [provider]  lisinopril (PRINIVIL,ZESTRIL) 40 MG tablet Take 40 mg by mouth daily.   Yes [provider]  omeprazole (PRILOSEC OTC) 20 MG tablet Take 20 mg by mouth every other day.   Yes [provider]  PROMETHAZINE VC PLAIN 6.25-5 MG/5ML SOLN Take 5 mLs by mouth 3 (three) times daily as needed for cough or nausea/vomiting. 02/07/17  Yes [provider]  doxycycline (VIBRAMYCIN) 50 MG capsule Take 2 capsules (100 mg total) by mouth 2 (two) times daily for 5 days. 02/11/17 02/16/17  Melanee Spry, MD  ondansetron (ZOFRAN) 4 MG tablet Take 1 tablet (4 mg total) by mouth daily as needed for nausea or vomiting. 02/11/17 02/11/18  Melanee Spry, MD    Family History Family History  Problem Relation Age of Onset  . Hypertension Mother   . Osteoporosis Mother   . Lung cancer Father   . Irregular heart beat Brother   . Heart attack Maternal Grandfather   . Cancer Paternal Grandfather     Social History Social History   Tobacco Use  . Smoking status: Former Smoker    Years: 20.00    Last attempt to  quit: 02/08/1961    Years since quitting: 56.0  . Smokeless tobacco: Former Systems developer    Quit date: 02/08/1961  Substance Use Topics  . Alcohol use: Yes    Alcohol/week: 4.2 oz    Types: 7 Standard drinks or equivalent per week    Comment: 2ozs Building services engineer daily  . Drug use: No     Allergies   Patient has no known allergies.   Review of Systems Review of Systems  Constitutional: Positive for appetite change, chills and fatigue. Negative for fever.  HENT: Positive for congestion, postnasal drip, rhinorrhea and sinus pressure. Negative for sore throat.   Eyes: Negative.   Respiratory: Positive for cough. Negative for shortness of breath.   Cardiovascular: Negative for chest pain.  Gastrointestinal: Positive for nausea. Negative  for abdominal pain and vomiting.  Genitourinary: Positive for difficulty urinating and frequency. Negative for dysuria.  Musculoskeletal: Positive for myalgias.  Neurological: Negative for dizziness and light-headedness.     Physical Exam Updated Vital Signs BP (!) 155/73 (BP Location: Left Arm)   Pulse 76   Temp (!) 97.3 F (36.3 C) (Oral)   Resp 18   Ht 6' (1.829 m)   Wt 77.1 kg (170 lb)   SpO2 96%   BMI 23.06 kg/m   Physical Exam  Constitutional: He is oriented to person, place, and time. He appears well-developed and well-nourished. No distress.  HENT:  Head: Normocephalic and atraumatic.  Mouth/Throat: Oropharynx is clear and moist.  Eyes: Conjunctivae and EOM are normal. Pupils are equal, round, and reactive to light.  Neck: Normal range of motion. Neck supple.  Cardiovascular: Normal rate, regular rhythm and intact distal pulses.  Pulmonary/Chest: Effort normal. No respiratory distress. He has no wheezes. He has rales in the right lower field.  Abdominal: Soft. Bowel sounds are normal. There is no tenderness.  Musculoskeletal: Normal range of motion. He exhibits no edema.  Lymphadenopathy:    He has no cervical adenopathy.  Neurological: He is  alert and oriented to person, place, and time.  Skin: Skin is warm and dry.  Psychiatric: He has a normal mood and affect.     ED Treatments / Results  Labs (all labs ordered are listed, but only abnormal results are displayed) Labs Reviewed  BASIC METABOLIC PANEL - Abnormal; Notable for the following components:      Result Value   Sodium 124 (*)    Chloride 92 (*)    CO2 19 (*)    Glucose, Bld 117 (*)    BUN 22 (*)    Creatinine, Ser 1.49 (*)    Calcium 8.5 (*)    GFR calc non Af Amer 40 (*)    GFR calc Af Amer 46 (*)    All other components within normal limits  CBC WITH DIFFERENTIAL/PLATELET - Abnormal; Notable for the following components:   RBC 3.52 (*)    Hemoglobin 11.5 (*)    HCT 32.4 (*)    All other components within normal limits    EKG  EKG Interpretation None       Radiology Dg Chest 2 View  Result Date: 02/11/2017 CLINICAL DATA:  Cough and flu-like symptoms 10 days. EXAM: CHEST  2 VIEW COMPARISON:  10/20/2015 and 01/19/2014 as well as CT 01/18/2014 FINDINGS: Lungs are adequately inflated with postsurgical change over the left hilar region. Mild stable chronic changes over the left lung. Slight increased density over the left midlung as cannot completely exclude atelectasis or early infectious process. No evidence of effusion. Cardiomediastinal silhouette and remainder of the exam is unchanged. IMPRESSION: Postsurgical and chronic change of the left lung and hilar region. Slight increased density over the left midlung as this may be due to atelectasis or early infection. Electronically Signed   By: Marin Olp M.D.   On: 02/11/2017 10:23    Procedures Procedures (including critical care time)  Medications Ordered in ED Medications - No data to display   Initial Impression / Assessment and Plan / ED Course  I have reviewed the triage vital signs and the nursing notes.  Pertinent labs & imaging results that were available during my care of the patient  were reviewed by me and considered in my medical decision making (see chart for details).  84 yo male presenting with generalized  weakness and 10 days of unimproved flu symptoms. Vital signs are stable. CBC within normal limits. BMET with hyponatremia, Na level 124, patient without neurological compromise and is asymptomatic. Creatinine stable. CXR did reveal an area of opacity in the left middle lobe that was concerning for an early infectious process. Concerning for post viral CAP and will treat with 5 days of Doxycycline. Also discussed high salt diet for the next few days with the patient. Patient is stable for discharge. The patient was instructed to follow up with his PCP within the next few days.   Will call patient's PCP office to discuss ED course and treatment plan, as they referred him to the ED.   Final Clinical Impressions(s) / ED Diagnoses   Final diagnoses:  Influenza and pneumonia    ED Discharge Orders        Ordered    doxycycline (VIBRAMYCIN) 50 MG capsule  2 times daily     02/11/17 1119    ondansetron (ZOFRAN) 4 MG tablet  Daily PRN     02/11/17 1119       Melanee Spry, MD 02/11/17 1128

## 2017-02-11 NOTE — ED Triage Notes (Signed)
patient states he was diagnosed with Type A flu 4 days ago. Patient states he wfeels like he is not getting any better and feels much weaker.

## 2017-02-11 NOTE — ED Notes (Signed)
Patient presents with flu like symptoms: cough, decreased appetite, aches, chills, and generalized weakness. Confirmed flu A + last Wednesday, no improvement of symptoms.

## 2017-02-11 NOTE — ED Notes (Signed)
ED Provider at bedside. 

## 2017-02-11 NOTE — Discharge Instructions (Addendum)
[name],   I have written you two prescriptions. One is an antibiotic called Doxycycline. Your chest xray did reveal an area in your left lung that may be early pneumonia. Please take 100 mg of Doxycycline twice daily for 5 days. The other medication I prescribed you is called Zofran. This is a medicine to help with nausea. Take one 4 mg tablet every 8 hours as needed for nausea. This may help with your appetite.   Your labs did show that your sodium is level is low. I recommend increasing the salt in your diet the next few days.   I will call your PCP and inform them of your emergency department evaluation. Please follow up with your PCP in 2-3 days.

## 2017-02-11 NOTE — ED Provider Notes (Signed)
I have personally seen and examined the patient. I have reviewed the documentation on PMH/FH/Soc Hx. I have discussed the plan of care with the resident and patient.  I have reviewed and agree with the resident's documentation. Please see associated encounter note.  Briefly, the patient is a 84 y.o. male here with flu-like symptoms for 10 days; diagnosed with influenza A 5 days ago by PCP. Has not improved. No fevers. Decreased appetite, but no emesis or diarrhea. Labs with hyponatremia, otherwise at baseline. CXR with possible PNA in left middle lung field. Will treat with antiemetic, high salt diet, and Doxy.The patient is safe for discharge with strict return precautions.    EKG Interpretation None         Cardama, Grayce Sessions, MD 02/11/17 [id number]

## 2017-02-18 DIAGNOSIS — I10 Essential (primary) hypertension: Secondary | ICD-10-CM | POA: Diagnosis not present

## 2017-02-18 DIAGNOSIS — J399 Disease of upper respiratory tract, unspecified: Secondary | ICD-10-CM | POA: Diagnosis not present

## 2017-02-19 ENCOUNTER — Ambulatory Visit: Payer: PPO | Admitting: Cardiovascular Disease

## 2017-02-19 ENCOUNTER — Encounter (HOSPITAL_COMMUNITY): Payer: Self-pay | Admitting: Emergency Medicine

## 2017-02-19 ENCOUNTER — Other Ambulatory Visit: Payer: Self-pay

## 2017-02-19 ENCOUNTER — Emergency Department (HOSPITAL_COMMUNITY): Payer: PPO

## 2017-02-19 ENCOUNTER — Inpatient Hospital Stay (HOSPITAL_COMMUNITY)
Admission: EM | Admit: 2017-02-19 | Discharge: 2017-02-24 | DRG: 194 | Disposition: A | Discharge status: Home / Self Care | Payer: PPO | Attending: Internal Medicine | Admitting: Internal Medicine

## 2017-02-19 DIAGNOSIS — N179 Acute kidney failure, unspecified: Secondary | ICD-10-CM | POA: Diagnosis present

## 2017-02-19 DIAGNOSIS — N400 Enlarged prostate without lower urinary tract symptoms: Secondary | ICD-10-CM | POA: Diagnosis not present

## 2017-02-19 DIAGNOSIS — K5900 Constipation, unspecified: Secondary | ICD-10-CM | POA: Diagnosis present

## 2017-02-19 DIAGNOSIS — R911 Solitary pulmonary nodule: Secondary | ICD-10-CM | POA: Diagnosis not present

## 2017-02-19 DIAGNOSIS — M109 Gout, unspecified: Secondary | ICD-10-CM | POA: Diagnosis not present

## 2017-02-19 DIAGNOSIS — J101 Influenza due to other identified influenza virus with other respiratory manifestations: Secondary | ICD-10-CM | POA: Diagnosis not present

## 2017-02-19 DIAGNOSIS — M1990 Unspecified osteoarthritis, unspecified site: Secondary | ICD-10-CM | POA: Diagnosis not present

## 2017-02-19 DIAGNOSIS — Z952 Presence of prosthetic heart valve: Secondary | ICD-10-CM

## 2017-02-19 DIAGNOSIS — D649 Anemia, unspecified: Secondary | ICD-10-CM

## 2017-02-19 DIAGNOSIS — R140 Abdominal distension (gaseous): Secondary | ICD-10-CM

## 2017-02-19 DIAGNOSIS — E872 Acidosis: Secondary | ICD-10-CM | POA: Diagnosis present

## 2017-02-19 DIAGNOSIS — N289 Disorder of kidney and ureter, unspecified: Secondary | ICD-10-CM

## 2017-02-19 DIAGNOSIS — E860 Dehydration: Secondary | ICD-10-CM | POA: Diagnosis not present

## 2017-02-19 DIAGNOSIS — E861 Hypovolemia: Secondary | ICD-10-CM | POA: Diagnosis not present

## 2017-02-19 DIAGNOSIS — I340 Nonrheumatic mitral (valve) insufficiency: Secondary | ICD-10-CM | POA: Diagnosis present

## 2017-02-19 DIAGNOSIS — Z96653 Presence of artificial knee joint, bilateral: Secondary | ICD-10-CM | POA: Diagnosis not present

## 2017-02-19 DIAGNOSIS — K219 Gastro-esophageal reflux disease without esophagitis: Secondary | ICD-10-CM | POA: Diagnosis present

## 2017-02-19 DIAGNOSIS — J189 Pneumonia, unspecified organism: Secondary | ICD-10-CM | POA: Diagnosis present

## 2017-02-19 DIAGNOSIS — Z902 Acquired absence of lung [part of]: Secondary | ICD-10-CM | POA: Diagnosis not present

## 2017-02-19 DIAGNOSIS — E785 Hyperlipidemia, unspecified: Secondary | ICD-10-CM | POA: Diagnosis not present

## 2017-02-19 DIAGNOSIS — R404 Transient alteration of awareness: Secondary | ICD-10-CM | POA: Diagnosis not present

## 2017-02-19 DIAGNOSIS — Z87891 Personal history of nicotine dependence: Secondary | ICD-10-CM

## 2017-02-19 DIAGNOSIS — I129 Hypertensive chronic kidney disease with stage 1 through stage 4 chronic kidney disease, or unspecified chronic kidney disease: Secondary | ICD-10-CM | POA: Diagnosis not present

## 2017-02-19 DIAGNOSIS — I341 Nonrheumatic mitral (valve) prolapse: Secondary | ICD-10-CM | POA: Diagnosis present

## 2017-02-19 DIAGNOSIS — I2510 Atherosclerotic heart disease of native coronary artery without angina pectoris: Secondary | ICD-10-CM | POA: Diagnosis not present

## 2017-02-19 DIAGNOSIS — J1000 Influenza due to other identified influenza virus with unspecified type of pneumonia: Secondary | ICD-10-CM | POA: Diagnosis not present

## 2017-02-19 DIAGNOSIS — J440 Chronic obstructive pulmonary disease with acute lower respiratory infection: Secondary | ICD-10-CM | POA: Diagnosis present

## 2017-02-19 DIAGNOSIS — D631 Anemia in chronic kidney disease: Secondary | ICD-10-CM | POA: Diagnosis present

## 2017-02-19 DIAGNOSIS — N183 Chronic kidney disease, stage 3 (moderate): Secondary | ICD-10-CM | POA: Diagnosis present

## 2017-02-19 DIAGNOSIS — E871 Hypo-osmolality and hyponatremia: Secondary | ICD-10-CM | POA: Diagnosis present

## 2017-02-19 DIAGNOSIS — Q2739 Arteriovenous malformation, other site: Secondary | ICD-10-CM

## 2017-02-19 DIAGNOSIS — Z79899 Other long term (current) drug therapy: Secondary | ICD-10-CM

## 2017-02-19 DIAGNOSIS — R109 Unspecified abdominal pain: Secondary | ICD-10-CM | POA: Diagnosis not present

## 2017-02-19 DIAGNOSIS — J181 Lobar pneumonia, unspecified organism: Secondary | ICD-10-CM | POA: Diagnosis not present

## 2017-02-19 DIAGNOSIS — R5381 Other malaise: Secondary | ICD-10-CM | POA: Diagnosis not present

## 2017-02-19 LAB — BASIC METABOLIC PANEL
Anion gap: 12 (ref 5–15)
Anion gap: 10 (ref 5–15)
BUN: 24 mg/dL — ABNORMAL HIGH (ref 6–20)
BUN: 22 mg/dL — AB (ref 6–20)
CALCIUM: 8.6 mg/dL — AB (ref 8.9–10.3)
CHLORIDE: 86 mmol/L — AB (ref 101–111)
CO2: 19 mmol/L — ABNORMAL LOW (ref 22–32)
CO2: 21 mmol/L — ABNORMAL LOW (ref 22–32)
Calcium: 8.7 mg/dL — ABNORMAL LOW (ref 8.9–10.3)
Chloride: 83 mmol/L — ABNORMAL LOW (ref 101–111)
Creatinine, Ser: 1.4 mg/dL — ABNORMAL HIGH (ref 0.61–1.24)
Creatinine, Ser: 1.42 mg/dL — ABNORMAL HIGH (ref 0.61–1.24)
GFR calc Af Amer: 49 mL/min — ABNORMAL LOW (ref >60)
GFR calc non Af Amer: 42 mL/min — ABNORMAL LOW (ref >60)
GFR, EST AFRICAN AMERICAN: 50 mL/min — AB (ref >60)
GFR, EST NON AFRICAN AMERICAN: 43 mL/min — AB (ref >60)
GLUCOSE: 177 mg/dL — AB (ref 65–99)
Glucose, Bld: 111 mg/dL — ABNORMAL HIGH (ref 65–99)
POTASSIUM: 4.5 mmol/L (ref 3.5–5.1)
POTASSIUM: 4.6 mmol/L (ref 3.5–5.1)
SODIUM: 114 mmol/L — AB (ref 135–145)
Sodium: 117 mmol/L — CL (ref 135–145)

## 2017-02-19 LAB — CBC WITH DIFFERENTIAL/PLATELET
Basophils Absolute: 0 10*3/uL (ref 0.0–0.1)
Basophils Relative: 0 %
EOS ABS: 0 10*3/uL (ref 0.0–0.7)
Eosinophils Relative: 0 %
HCT: 29.2 % — ABNORMAL LOW (ref 39.0–52.0)
HEMOGLOBIN: 10.7 g/dL — AB (ref 13.0–17.0)
LYMPHS ABS: 1.3 10*3/uL (ref 0.7–4.0)
LYMPHS PCT: 17 %
MCH: 32.3 pg (ref 26.0–34.0)
MCHC: 36.6 g/dL — AB (ref 30.0–36.0)
MCV: 88.2 fL (ref 78.0–100.0)
MONOS PCT: 14 %
Monocytes Absolute: 1.1 10*3/uL — ABNORMAL HIGH (ref 0.1–1.0)
NEUTROS PCT: 69 %
Neutro Abs: 5.5 10*3/uL (ref 1.7–7.7)
Platelets: 329 10*3/uL (ref 150–400)
RBC: 3.31 MIL/uL — ABNORMAL LOW (ref 4.22–5.81)
RDW: 12.8 % (ref 11.5–15.5)
WBC: 8 10*3/uL (ref 4.0–10.5)

## 2017-02-19 LAB — URINALYSIS, ROUTINE W REFLEX MICROSCOPIC
BACTERIA UA: NONE SEEN
Bilirubin Urine: NEGATIVE
Glucose, UA: NEGATIVE mg/dL
Hgb urine dipstick: NEGATIVE
KETONES UR: NEGATIVE mg/dL
LEUKOCYTES UA: NEGATIVE
Nitrite: NEGATIVE
PH: 7 (ref 5.0–8.0)
Protein, ur: 100 mg/dL — AB
Specific Gravity, Urine: 1.01 (ref 1.005–1.030)
Squamous Epithelial / LPF: NONE SEEN

## 2017-02-19 LAB — CREATININE, SERUM
CREATININE: 1.37 mg/dL — AB (ref 0.61–1.24)
GFR calc Af Amer: 51 mL/min — ABNORMAL LOW (ref >60)
GFR calc non Af Amer: 44 mL/min — ABNORMAL LOW (ref >60)

## 2017-02-19 LAB — COMPREHENSIVE METABOLIC PANEL
ALT: 14 U/L — AB (ref 17–63)
AST: 38 U/L (ref 15–41)
Albumin: 3.4 g/dL — ABNORMAL LOW (ref 3.5–5.0)
Alkaline Phosphatase: 107 U/L (ref 38–126)
Anion gap: 11 (ref 5–15)
BUN: 25 mg/dL — ABNORMAL HIGH (ref 6–20)
CHLORIDE: 80 mmol/L — AB (ref 101–111)
CO2: 21 mmol/L — AB (ref 22–32)
CREATININE: 1.43 mg/dL — AB (ref 0.61–1.24)
Calcium: 8.8 mg/dL — ABNORMAL LOW (ref 8.9–10.3)
GFR calc non Af Amer: 42 mL/min — ABNORMAL LOW (ref >60)
GFR, EST AFRICAN AMERICAN: 49 mL/min — AB (ref >60)
Glucose, Bld: 133 mg/dL — ABNORMAL HIGH (ref 65–99)
Potassium: 4.6 mmol/L (ref 3.5–5.1)
SODIUM: 112 mmol/L — AB (ref 135–145)
Total Bilirubin: 0.7 mg/dL (ref 0.3–1.2)
Total Protein: 6.9 g/dL (ref 6.5–8.1)

## 2017-02-19 LAB — CBC
HCT: 28.4 % — ABNORMAL LOW (ref 39.0–52.0)
Hemoglobin: 10.3 g/dL — ABNORMAL LOW (ref 13.0–17.0)
MCH: 32.1 pg (ref 26.0–34.0)
MCHC: 36.3 g/dL — ABNORMAL HIGH (ref 30.0–36.0)
MCV: 88.5 fL (ref 78.0–100.0)
PLATELETS: 299 10*3/uL (ref 150–400)
RBC: 3.21 MIL/uL — ABNORMAL LOW (ref 4.22–5.81)
RDW: 12.9 % (ref 11.5–15.5)
WBC: 7.3 10*3/uL (ref 4.0–10.5)

## 2017-02-19 LAB — CBG MONITORING, ED: Glucose-Capillary: 142 mg/dL — ABNORMAL HIGH (ref 65–99)

## 2017-02-19 LAB — STREP PNEUMONIAE URINARY ANTIGEN: STREP PNEUMO URINARY ANTIGEN: NEGATIVE

## 2017-02-19 LAB — INFLUENZA PANEL BY PCR (TYPE A & B)
Influenza A By PCR: POSITIVE — AB
Influenza B By PCR: NEGATIVE

## 2017-02-19 LAB — NA AND K (SODIUM & POTASSIUM), RAND UR
Potassium Urine: 36 mmol/L
Sodium, Ur: 64 mmol/L

## 2017-02-19 LAB — GLUCOSE, CAPILLARY
GLUCOSE-CAPILLARY: 132 mg/dL — AB (ref 65–99)
GLUCOSE-CAPILLARY: 134 mg/dL — AB (ref 65–99)

## 2017-02-19 LAB — OSMOLALITY: Osmolality: 247 mOsm/kg — CL (ref 275–295)

## 2017-02-19 LAB — OSMOLALITY, URINE: Osmolality, Ur: 369 mOsm/kg (ref 300–900)

## 2017-02-19 MED ORDER — ONDANSETRON HCL 4 MG/2ML IJ SOLN: 4.0000 mg | Freq: Four times a day (QID) | INTRAMUSCULAR | Status: DC | PRN

## 2017-02-19 MED ORDER — OMEPRAZOLE 20 MG PO CPDR
20.0000 mg | DELAYED_RELEASE_CAPSULE | ORAL | Status: DC
  Administered 2017-02-20 – 2017-02-24 (×3): 20 mg via ORAL
  Filled 2017-02-19 (×3): qty 1

## 2017-02-19 MED ORDER — ACETAMINOPHEN 650 MG RE SUPP: 650.0000 mg | Freq: Four times a day (QID) | RECTAL | Status: DC | PRN

## 2017-02-19 MED ORDER — INSULIN ASPART 100 UNIT/ML ~~LOC~~ SOLN
0.0000 [IU] | Freq: Three times a day (TID) | SUBCUTANEOUS | Status: DC
  Administered 2017-02-19 – 2017-02-20 (×3): 1 [IU] via SUBCUTANEOUS
  Administered 2017-02-20: 2 [IU] via SUBCUTANEOUS
  Administered 2017-02-21 – 2017-02-23 (×5): 1 [IU] via SUBCUTANEOUS
  Filled 2017-02-19: qty 1

## 2017-02-19 MED ORDER — LIP MEDEX EX OINT
1.0000 "application " | TOPICAL_OINTMENT | CUTANEOUS | Status: DC | PRN
  Filled 2017-02-19: qty 7

## 2017-02-19 MED ORDER — DILTIAZEM HCL ER COATED BEADS 180 MG PO CP24
300.0000 mg | ORAL_CAPSULE | Freq: Every day | ORAL | Status: DC
  Administered 2017-02-19 – 2017-02-24 (×6): 300 mg via ORAL
  Filled 2017-02-19 (×6): qty 1

## 2017-02-19 MED ORDER — ENSURE ENLIVE PO LIQD
237.0000 mL | Freq: Two times a day (BID) | ORAL | Status: DC
  Administered 2017-02-20 – 2017-02-23 (×6): 237 mL via ORAL

## 2017-02-19 MED ORDER — HEPARIN SODIUM (PORCINE) 5000 UNIT/ML IJ SOLN
5000.0000 [IU] | Freq: Three times a day (TID) | INTRAMUSCULAR | Status: DC
  Administered 2017-02-19 – 2017-02-24 (×15): 5000 [IU] via SUBCUTANEOUS
  Filled 2017-02-19 (×15): qty 1

## 2017-02-19 MED ORDER — LORATADINE 10 MG PO TABS: 10.0000 mg | ORAL_TABLET | Freq: Every day | ORAL | Status: DC | PRN

## 2017-02-19 MED ORDER — SODIUM CHLORIDE 0.9 % IV SOLN
1.0000 g | INTRAVENOUS | Status: DC
  Administered 2017-02-19 – 2017-02-22 (×4): 1 g via INTRAVENOUS
  Filled 2017-02-19 (×2): qty 1
  Filled 2017-02-19 (×2): qty 10

## 2017-02-19 MED ORDER — IPRATROPIUM-ALBUTEROL 0.5-2.5 (3) MG/3ML IN SOLN
3.0000 mL | Freq: Four times a day (QID) | RESPIRATORY_TRACT | Status: DC
  Administered 2017-02-19 (×3): 3 mL via RESPIRATORY_TRACT
  Filled 2017-02-19 (×2): qty 3

## 2017-02-19 MED ORDER — SENNOSIDES-DOCUSATE SODIUM 8.6-50 MG PO TABS: 1.0000 | ORAL_TABLET | Freq: Every evening | ORAL | Status: DC | PRN

## 2017-02-19 MED ORDER — BISACODYL 5 MG PO TBEC: 5.0000 mg | DELAYED_RELEASE_TABLET | Freq: Every day | ORAL | Status: DC | PRN

## 2017-02-19 MED ORDER — ACETAMINOPHEN 325 MG PO TABS: 650.0000 mg | ORAL_TABLET | Freq: Four times a day (QID) | ORAL | Status: DC | PRN

## 2017-02-19 MED ORDER — SALINE SPRAY 0.65 % NA SOLN
1.0000 | NASAL | Status: DC | PRN
  Filled 2017-02-19: qty 44

## 2017-02-19 MED ORDER — HYDROCORTISONE 1 % EX CREA
1.0000 "application " | TOPICAL_CREAM | Freq: Three times a day (TID) | CUTANEOUS | Status: DC | PRN
  Filled 2017-02-19: qty 28

## 2017-02-19 MED ORDER — SODIUM CHLORIDE 0.9 % IV SOLN
500.0000 mg | INTRAVENOUS | Status: DC
  Administered 2017-02-19 – 2017-02-21 (×3): 500 mg via INTRAVENOUS
  Filled 2017-02-19 (×4): qty 500

## 2017-02-19 MED ORDER — HYDROCODONE-ACETAMINOPHEN 5-325 MG PO TABS: 1.0000 | ORAL_TABLET | ORAL | Status: DC | PRN

## 2017-02-19 MED ORDER — SODIUM CHLORIDE 0.9 % IV BOLUS (SEPSIS)
1000.0000 mL | Freq: Once | INTRAVENOUS | Status: AC
  Administered 2017-02-19: 1000 mL via INTRAVENOUS

## 2017-02-19 MED ORDER — ALUM & MAG HYDROXIDE-SIMETH 200-200-20 MG/5ML PO SUSP: 30.0000 mL | ORAL | Status: DC | PRN

## 2017-02-19 MED ORDER — GUAIFENESIN-DM 100-10 MG/5ML PO SYRP: 5.0000 mL | ORAL_SOLUTION | ORAL | Status: DC | PRN

## 2017-02-19 MED ORDER — ASPIRIN EC 81 MG PO TBEC
81.0000 mg | DELAYED_RELEASE_TABLET | Freq: Every day | ORAL | Status: DC
  Administered 2017-02-19 – 2017-02-24 (×6): 81 mg via ORAL
  Filled 2017-02-19 (×6): qty 1

## 2017-02-19 MED ORDER — HYDROCORTISONE 2.5 % RE CREA
1.0000 "application " | TOPICAL_CREAM | Freq: Four times a day (QID) | RECTAL | Status: DC | PRN
  Filled 2017-02-19: qty 28.35

## 2017-02-19 MED ORDER — SODIUM CHLORIDE 0.9 % IV SOLN
INTRAVENOUS | Status: DC
  Administered 2017-02-20 – 2017-02-23 (×6): via INTRAVENOUS

## 2017-02-19 MED ORDER — ATORVASTATIN CALCIUM 10 MG PO TABS
10.0000 mg | ORAL_TABLET | Freq: Every day | ORAL | Status: DC
  Administered 2017-02-19 – 2017-02-23 (×5): 10 mg via ORAL
  Filled 2017-02-19 (×6): qty 1

## 2017-02-19 MED ORDER — OMEPRAZOLE MAGNESIUM 20 MG PO TBEC: 20.0000 mg | DELAYED_RELEASE_TABLET | ORAL | Status: DC

## 2017-02-19 MED ORDER — LISINOPRIL 20 MG PO TABS
40.0000 mg | ORAL_TABLET | Freq: Every day | ORAL | Status: DC
  Administered 2017-02-19 – 2017-02-24 (×6): 40 mg via ORAL
  Filled 2017-02-19 (×6): qty 2

## 2017-02-19 MED ORDER — ALLOPURINOL 300 MG PO TABS
150.0000 mg | ORAL_TABLET | Freq: Every day | ORAL | Status: DC
  Administered 2017-02-19 – 2017-02-24 (×6): 150 mg via ORAL
  Filled 2017-02-19 (×6): qty 1

## 2017-02-19 MED ORDER — INSULIN ASPART 100 UNIT/ML ~~LOC~~ SOLN: 0.0000 [IU] | Freq: Every day | SUBCUTANEOUS | Status: DC

## 2017-02-19 MED ORDER — SODIUM CHLORIDE 0.9 % IV SOLN
INTRAVENOUS | Status: DC
  Administered 2017-02-19: 08:00:00 via INTRAVENOUS

## 2017-02-19 MED ORDER — PHENOL 1.4 % MT LIQD
1.0000 | OROMUCOSAL | Status: DC | PRN
  Filled 2017-02-19: qty 177

## 2017-02-19 MED ORDER — MUSCLE RUB 10-15 % EX CREA
1.0000 "application " | TOPICAL_CREAM | CUTANEOUS | Status: DC | PRN
  Filled 2017-02-19: qty 85

## 2017-02-19 MED ORDER — ONDANSETRON HCL 4 MG PO TABS
4.0000 mg | ORAL_TABLET | Freq: Four times a day (QID) | ORAL | Status: DC | PRN
Start: 2017-02-19 — End: 2017-02-24

## 2017-02-19 MED ORDER — POLYVINYL ALCOHOL 1.4 % OP SOLN
1.0000 [drp] | OPHTHALMIC | Status: DC | PRN
  Filled 2017-02-19: qty 15

## 2017-02-19 NOTE — ED Triage Notes (Signed)
Pt presents from home for Abnormal labs regarding sodium that was called in by his doctor this morning at 0430 after being reevaluated for flu yesterday at his PCP.

## 2017-02-19 NOTE — H&P (Signed)
History and Physical    RAPHEL STICKLES PZW:[id number] DOB: Aug 01, 1925 DOA: 02/19/2017  PCP: [name], MD Patient coming from: Home  Chief Complaint: Fatigue  HPI: [name] is a 84 y.o. male with medical history significant of mitral valve repair hyperlipidemia, hypertension, CAD, CKD stage III came to the hospital with complains of generalized weakness and fatigue.  Patient states he was diagnosed of possible bacterial bronchitis about 2 weeks ago and was placed on 5 days of azithromycin which did not help him.  He returned back to his PCP and thinks was diagnosed with flu and was provided supportive care.  Even this did not help him much therefore he came to the ER last week and was discharged on 5 days of 50 mg twice daily doxycycline.  He was also noted to be slightly hyponatremic at 124 for which she was asked to increase his hydration and eat salty food.  He tried doing this at home but his appetite remains very poor.  Despite of these things he continued to feel very fatigued, generalized weakness with some productive cough bringing up greenish brown sputum.  Denies any fevers, chills. He quit smoking about 50 years ago.  Denies any alcohol and drug use  In the ER patient appeared generally weak with some signs of dehydration.  His labs showed sodium of 112, chloride 80, creatinine 1.43.  CT of the chest showed 7 mm nodule in the right upper lobe, early pneumonia-patchy peripheral space opacity.  Due to failure of outpatient treatment with oral antibiotics it was determined to admit him for further inpatient care.   Review of Systems: As per HPI otherwise 10 point review of systems negative.   Past Medical History:  Diagnosis Date  . Anemia    low hemoglobin  . Arthritis   . AV (angiodysplasia malformation of colon)   . Benign prostatic hypertrophy   . Chronic kidney disease    elevated Cr, BUN, Dr. Hassell Done manages  . COPD (chronic obstructive pulmonary disease)  ([place])   . Coronary artery disease   . Gout   . Heart murmur   . Hyperlipidemia   . Hypertension   . Mitral regurgitation   . Mitral valve prolapse   . PONV (postoperative nausea and vomiting)   . Shortness of breath     Past Surgical History:  Procedure Laterality Date  . ABDOMINAL ANGIOGRAM  04/30/2011   Procedure: ABDOMINAL ANGIOGRAM;  Surgeon: Lorretta Harp, MD;  Location: [facility] CATH LAB;  Service: Cardiovascular;;  . CARDIAC CATHETERIZATION  04/2011   x2  . CARDIAC CATHETERIZATION  08/2009  . CATARACT EXTRACTION  2011   bilateral  . COLONOSCOPY  05/04/2011   Procedure: COLONOSCOPY;  Surgeon: Beryle Beams, MD;  Location: [facility] ENDOSCOPY;  Service: Endoscopy;  Laterality: N/A;  . COLONOSCOPY N/A 07/16/2013   Procedure: COLONOSCOPY;  Surgeon: Beryle Beams, MD;  Location: [place];  Service: Endoscopy;  Laterality: N/A;  . CORONARY ANGIOGRAM  04/30/2011   Procedure: CORONARY ANGIOGRAM;  Surgeon: Lorretta Harp, MD;  Location: [facility] CATH LAB;  Service: Cardiovascular;;  . ESOPHAGOGASTRODUODENOSCOPY  05/04/2011   Procedure: ESOPHAGOGASTRODUODENOSCOPY (EGD);  Surgeon: Beryle Beams, MD;  Location: [facility] ENDOSCOPY;  Service: Endoscopy;  Laterality: N/A;  . EYE SURGERY     bilateral cataracts removed  . HERNIA REPAIR  1999&2000   bilateral  . LOBECTOMY  1980   left lower lobectomy for benign tumor  . REPLACEMENT TOTAL KNEE  2005   right  .  RIGHT HEART CATHETERIZATION  04/30/2011   Procedure: RIGHT HEART CATH;  Surgeon: Lorretta Harp, MD;  Location: [facility] CATH LAB;  Service: Cardiovascular;;  . TEE WITHOUT CARDIOVERSION  04/30/2011   Procedure: TRANSESOPHAGEAL ECHOCARDIOGRAM (TEE);  Surgeon: Sanda Klein, MD;  Location[facility] ENDOSCOPY;  Service: Cardiovascular;  Laterality: N/A;  3D TEE/ patient will be admitted the day before test , therefo patient will be a inpatient the day of TEE  . TOTAL KNEE ARTHROPLASTY Left 02/15/2012   Procedure: TOTAL KNEE ARTHROPLASTY;  Surgeon: Alta Corning,  MD;  Location: [facility];  Service: Orthopedics;  Laterality: Left;     reports that he quit smoking about 56 years ago. He quit after 20.00 years of use. He quit smokeless tobacco use about 56 years ago. He reports that he drinks about 4.2 oz of alcohol per week. He reports that he does not use drugs.  No Known Allergies  Family History  Problem Relation Age of Onset  . Hypertension Mother   . Osteoporosis Mother   . Lung cancer Father   . Irregular heart beat Brother   . Heart attack Maternal Grandfather   . Cancer Paternal Grandfather      Prior to Admission medications   Medication Sig Start Date End Date Taking? Authorizing Provider  acetaminophen (TYLENOL) 325 MG tablet Take 650 mg by mouth every 6 (six) hours as needed for mild pain or headache.   Yes [provider]  allopurinol (ZYLOPRIM) 300 MG tablet Take 150 mg by mouth daily.    Yes [provider]  aspirin EC 81 MG tablet Take 81 mg by mouth daily.   Yes [provider]  atorvastatin (LIPITOR) 10 MG tablet Take 1 tablet (10 mg total) by mouth daily at 6 PM. 05/10/16  Yes Lorretta Harp, MD  diltiazem (CARDIZEM CD) 300 MG 24 hr capsule Take 300 mg by mouth daily.   Yes [provider]  lisinopril (PRINIVIL,ZESTRIL) 40 MG tablet Take 40 mg by mouth daily.   Yes [provider]  omeprazole (PRILOSEC OTC) 20 MG tablet Take 20 mg by mouth every other day.   Yes [provider]  ondansetron (ZOFRAN) 4 MG tablet Take 1 tablet (4 mg total) by mouth daily as needed for nausea or vomiting. 02/11/17 02/11/18 Yes Melanee Spry, MD    Physical Exam: Vitals:   02/19/17 0615 02/19/17 0620 02/19/17 0624  BP: (!) 160/71    Pulse: 60    Resp: 16    Temp: (!) 97.4 F (36.3 C)    TempSrc: Oral    SpO2: 97% 98%   Weight:   77.1 kg (170 lb)  Height:   6' (1.829 m)      Constitutional: NAD, calm, comfortable, elderly frail-appearing Vitals:   02/19/17 0615 02/19/17 0620  02/19/17 0624  BP: (!) 160/71    Pulse: 60    Resp: 16    Temp: (!) 97.4 F (36.3 C)    TempSrc: Oral    SpO2: 97% 98%   Weight:   77.1 kg (170 lb)  Height:   6' (1.829 m)   Eyes: PERRL, lids and conjunctivae normal ENMT: Mucous membranes are dry. Posterior pharynx clear of any exudate or lesions.Normal dentition.  Neck: normal, supple, no masses, no thyromegaly Respiratory: Crackles heard at the right lower lung base otherwise clear to auscultation bilaterally Cardiovascular: Regular rate and rhythm, no murmurs / rubs / gallops. No extremity edema. 2+ pedal pulses. No carotid bruits.  Abdomen: no tenderness, no masses palpated. No hepatosplenomegaly. Bowel sounds positive.  Musculoskeletal: no clubbing / cyanosis. No joint deformity upper and lower extremities. Good ROM, no contractures. Normal muscle tone.  Skin: no rashes, lesions, ulcers. No induration Neurologic: CN 2-12 grossly intact. Sensation intact, DTR normal. Strength 5/5 in all 4.  Psychiatric: Normal judgment and insight. Alert and oriented x 3. Normal mood.     Labs on Admission: I have personally reviewed following labs and imaging studies  CBC: Recent Labs  Lab 02/19/17 0625  WBC 8.0  NEUTROABS 5.5  HGB 10.7*  HCT 29.2*  MCV 88.2  PLT 916   Basic Metabolic Panel: Recent Labs  Lab 02/19/17 0625  NA 112*  K 4.6  CL 80*  CO2 21*  GLUCOSE 133*  BUN 25*  CREATININE 1.43*  CALCIUM 8.8*   GFR: Estimated Creatinine Clearance: 38.2 mL/min (A) (by C-G formula based on SCr of 1.43 mg/dL (H)). Liver Function Tests: Recent Labs  Lab 02/19/17 0625  AST 38  ALT 14*  ALKPHOS 107  BILITOT 0.7  PROT 6.9  ALBUMIN 3.4*   No results for input(s): LIPASE, AMYLASE in the last 168 hours. No results for input(s): AMMONIA in the last 168 hours. Coagulation Profile: No results for input(s): INR, PROTIME in the last 168 hours. Cardiac Enzymes: No results for input(s): CKTOTAL, CKMB, CKMBINDEX, TROPONINI in the  last 168 hours. BNP (last 3 results) No results for input(s): PROBNP in the last 8760 hours. HbA1C: No results for input(s): HGBA1C in the last 72 hours. CBG: No results for input(s): GLUCAP in the last 168 hours. Lipid Profile: No results for input(s): CHOL, HDL, LDLCALC, TRIG, CHOLHDL, LDLDIRECT in the last 72 hours. Thyroid Function Tests: No results for input(s): TSH, T4TOTAL, FREET4, T3FREE, THYROIDAB in the last 72 hours. Anemia Panel: No results for input(s): VITAMINB12, FOLATE, FERRITIN, TIBC, IRON, RETICCTPCT in the last 72 hours. Urine analysis:    Component Value Date/Time   COLORURINE YELLOW 06/30/2013 [place] 06/30/2013 2315   LABSPEC 1.012 06/30/2013 2315   PHURINE 5.5 06/30/2013 2315   GLUCOSEU NEGATIVE 06/30/2013 2315   HGBUR NEGATIVE 06/30/2013 2315   BILIRUBINUR NEGATIVE 06/30/2013 2315   KETONESUR NEGATIVE 06/30/2013 2315   PROTEINUR NEGATIVE 06/30/2013 2315   UROBILINOGEN 0.2 06/30/2013 2315   NITRITE NEGATIVE 06/30/2013 2315   LEUKOCYTESUR NEGATIVE 06/30/2013 2315   Sepsis Labs: !!!!!!!!!!!!!!!!!!!!!!!!!!!!!!!!!!!!!!!!!!!! @LABRCNTIP (procalcitonin:4,lacticidven:4) )No results found for this or any previous visit (from the past 240 hour(s)).   Radiological Exams on Admission: Ct Chest Wo Contrast  Result Date: 02/19/2017 CLINICAL DATA:  Persistent cough EXAM: CT CHEST WITHOUT CONTRAST TECHNIQUE: Multidetector CT imaging of the chest was performed following the standard protocol without IV contrast. COMPARISON:  Chest x-ray 02/11/2017.  Chest CT 01/19/2014. FINDINGS: Cardiovascular: Heart is enlarged. Moderate coronary artery and aortic calcifications. No evidence of aortic aneurysm. Mediastinum/Nodes: Scattered small mediastinal lymph nodes are stable or improved since 2016. No axillary or hilar adenopathy. Small hiatal hernia. Lungs/Pleura: Biapical scarring patchy airspace opacities in both lungs involving the left upper lobe and left lower  lobe and right lower lobe concerning for pneumonia. 6 mm nodule in the right upper lobe on image 51 is new since 2016. No effusions. Upper Abdomen: Imaging into the upper abdomen shows no acute findings. Musculoskeletal: Chest wall soft tissues are unremarkable. No acute bony abnormality. IMPRESSION: Patchy peripheral airspace opacities in both lungs, left greater than right concerning for early pneumonia. Mild cardiomegaly.  Coronary artery disease.  7 mm nodule in the right upper lobe. Non-contrast chest CT at 6-12 months is recommended. If the nodule is stable at time of repeat CT, then future CT at 18-24 months (from today's scan) is considered optional for low-risk patients, but is recommended for high-risk patients. This recommendation follows the consensus statement: Guidelines for Management of Incidental Pulmonary Nodules Detected on CT Images: From the Fleischner Society 2017; Radiology 2017; 284:228-243. Small hiatal hernia. Aortic Atherosclerosis (ICD10-I70.0). Electronically Signed   By: Rolm Baptise M.D.   On: 02/19/2017 07:26      Assessment/Plan Active Problems:   Hyponatremia    Generalized weakness Community-acquired pneumonia, failed outpatient treatment - Admit the patient to the hospital for further care.  Has failed a round of azithromycin and doxycycline for 5 days each outpatient -We will go ahead and order blood cultures, UA and sputum cultures prior to starting antibiotics.  Rocephin and azithromycin - Check urine Legionella and strep. - Nebulizer treatments every 6 hours, antitussives as needed -Supplemental oxygen as needed -Check flu swab  Hyponatremia; suspect hypovolemia -Likely secondary to dehydration from poor p.o. intake over 2 weeks - 1 L normal saline given in the ER saline at 75 cc/h -Will check BMP periodically -Urine electrolytes, urine osmolality and serum osmolality has been ordered  7 mm pulmonary nodule -Quit smoking at least 50 years ago.  He will  need a repeat scan in about 12 months without contrast to further evaluate and monitor this  Coronary artery disease Mitral valve repair - This appears to be stable -We will continue his home aspirin, statin, Cardizem and lisinopril  GERD -Continue omeprazole 20 mg  History of gout -On allopurinol  CKD stage III -Creatinine is 1.43 which is somewhat close to his baseline    DVT prophylaxis: Hep SQ Code Status: DNR Family Communication:  Wife at beside  Disposition Plan:  TBD Consults called: None Admission status: Inpatient    Osmany Azer Arsenio Loader MD [facility] Pager [phone]  If 7PM-7AM, please contact night-coverage [email or web address] Password [facility]  02/19/2017, 8:15 AM

## 2017-02-19 NOTE — ED Notes (Signed)
Bed: HF02 Expected date:  Expected time:  Means of arrival:  Comments: EMS 84 yo male from home/low sodium from lab work done yesterday/flu last 2 weeks

## 2017-02-19 NOTE — ED Notes (Signed)
Date and time results received: 02/19/17 11:25 a.m. (use smartphrase ".now" to insert current time)  Test: Urine Osmolality  Critical Value: 247  Name of Provider Notified: MD Reesa Chew  Orders Received? Or Actions Taken?: N/A

## 2017-02-19 NOTE — ED Notes (Signed)
Meal tray provided.

## 2017-02-19 NOTE — ED Notes (Signed)
Admitting MD paged for critical sodium level of 114.

## 2017-02-19 NOTE — ED Provider Notes (Signed)
[facility] DEPT Provider Note   CSN: [id number] Arrival date & time: 02/19/17  7619     History   Chief Complaint Chief Complaint  Patient presents with  . Abnormal Labs    HPI [name] is a 84 y.o. male.   The history is provided by the patient.  He has history of chronic kidney disease, COPD, coronary artery disease, hypertension, hyperlipidemia and was sent here by his primary care provider because of low sodium.  He has had what he calls the flu for the last 2 weeks.  He has not been running a fever, but has had some congestion and a cough productive of a small amount of green sputum.  He has had mild nausea, but has had severe anorexia and has had some weight loss.  He was seen in the emergency department 8 days ago and discharged with prescription for doxycycline.  At that time, his sodium was noted to be low, but not dangerously low.  He saw his PCP yesterday who rechecked the sodium and found it to be dangerously low and had him come to the emergency department.  Of note, he is a former smoker having quit in 1963, but did have approximately 20 years of smoking.  He is also status post left lower lobectomy for benign tumor.  Past Medical History:  Diagnosis Date  . Anemia    low hemoglobin  . Arthritis   . AV (angiodysplasia malformation of colon)   . Benign prostatic hypertrophy   . Chronic kidney disease    elevated Cr, BUN, Dr. Hassell Done manages  . COPD (chronic obstructive pulmonary disease) ([place])   . Coronary artery disease   . Gout   . Heart murmur   . Hyperlipidemia   . Hypertension   . Mitral regurgitation   . Mitral valve prolapse   . PONV (postoperative nausea and vomiting)   . Shortness of breath     Patient Active Problem List   Diagnosis Date Noted  . GIB (gastrointestinal bleeding) 06/30/2013  . Postoperative anemia due to acute blood loss 02/18/2012  . Osteoarthritis of left knee 02/15/2012  . Hyperlipidemia   .  Anemia, recurrent Hx of colonic AVMs 12/11, transfused, heme stool neg. 05/01/2011  . Dyspnea 04/25/2011  . Mitral regurgitation, severe 04/25/2011  . CAD, single vessel RCA 04/25/2011  . HTN (hypertension) 04/25/2011  . Dyslipidemia 04/25/2011  . Chronic renal insufficiency, stage III (moderate) ([place]) 04/25/2011  . Gout 04/25/2011    Past Surgical History:  Procedure Laterality Date  . ABDOMINAL ANGIOGRAM  04/30/2011   Procedure: ABDOMINAL ANGIOGRAM;  Surgeon: Lorretta Harp, MD;  Location: [facility] CATH LAB;  Service: Cardiovascular;;  . CARDIAC CATHETERIZATION  04/2011   x2  . CARDIAC CATHETERIZATION  08/2009  . CATARACT EXTRACTION  2011   bilateral  . COLONOSCOPY  05/04/2011   Procedure: COLONOSCOPY;  Surgeon: Beryle Beams, MD;  Location: [facility] ENDOSCOPY;  Service: Endoscopy;  Laterality: N/A;  . COLONOSCOPY N/A 07/16/2013   Procedure: COLONOSCOPY;  Surgeon: Beryle Beams, MD;  Location: [facility];  Service: Endoscopy;  Laterality: N/A;  . CORONARY ANGIOGRAM  04/30/2011   Procedure: CORONARY ANGIOGRAM;  Surgeon: Lorretta Harp, MD;  Location: [facility] CATH LAB;  Service: Cardiovascular;;  . ESOPHAGOGASTRODUODENOSCOPY  05/04/2011   Procedure: ESOPHAGOGASTRODUODENOSCOPY (EGD);  Surgeon: Beryle Beams, MD;  Location: [facility] ENDOSCOPY;  Service: Endoscopy;  Laterality: N/A;  . EYE SURGERY     bilateral cataracts removed  .  HERNIA REPAIR  1999&2000   bilateral  . LOBECTOMY  1980   left lower lobectomy for benign tumor  . REPLACEMENT TOTAL KNEE  2005   right  . RIGHT HEART CATHETERIZATION  04/30/2011   Procedure: RIGHT HEART CATH;  Surgeon: Lorretta Harp, MD;  Location: [facility] CATH LAB;  Service: Cardiovascular;;  . TEE WITHOUT CARDIOVERSION  04/30/2011   Procedure: TRANSESOPHAGEAL ECHOCARDIOGRAM (TEE);  Surgeon: Sanda Klein, MD;  Location: [facility] ENDOSCOPY;  Service: Cardiovascular;  Laterality: N/A;  3D TEE/ patient will be admitted the day before test , therefo patient will be a inpatient the day  of TEE  . TOTAL KNEE ARTHROPLASTY Left 02/15/2012   Procedure: TOTAL KNEE ARTHROPLASTY;  Surgeon: Alta Corning, MD;  Location: [facility];  Service: Orthopedics;  Laterality: Left;       Home Medications    Prior to Admission medications   Medication Sig Start Date End Date Taking? Authorizing Provider  acetaminophen (TYLENOL) 325 MG tablet Take 650 mg by mouth every 6 (six) hours as needed for mild pain or headache.    [provider]  allopurinol (ZYLOPRIM) 300 MG tablet Take 150 mg by mouth daily.     [provider]  aspirin EC 81 MG tablet Take 81 mg by mouth daily.    [provider]  atorvastatin (LIPITOR) 10 MG tablet Take 1 tablet (10 mg total) by mouth daily at 6 PM. 05/10/16   Lorretta Harp, MD  diltiazem (CARDIZEM CD) 300 MG 24 hr capsule Take 300 mg by mouth daily.    [provider]  ipratropium (ATROVENT) 0.06 % nasal spray Place 2 sprays into both nostrils 4 (four) times daily. 02/03/17   [provider]  lisinopril (PRINIVIL,ZESTRIL) 40 MG tablet Take 40 mg by mouth daily.    [provider]  omeprazole (PRILOSEC OTC) 20 MG tablet Take 20 mg by mouth every other day.    [provider]  ondansetron (ZOFRAN) 4 MG tablet Take 1 tablet (4 mg total) by mouth daily as needed for nausea or vomiting. 02/11/17 02/11/18  Lacroce, Hulen Shouts, MD  PROMETHAZINE VC PLAIN 6.25-5 MG/5ML SOLN Take 5 mLs by mouth 3 (three) times daily as needed for cough or nausea/vomiting. 02/07/17   [provider]    Family History Family History  Problem Relation Age of Onset  . Hypertension Mother   . Osteoporosis Mother   . Lung cancer Father   . Irregular heart beat Brother   . Heart attack Maternal Grandfather   . Cancer Paternal Grandfather     Social History Social History   Tobacco Use  . Smoking status: Former Smoker    Years: 20.00    Last attempt to quit: 02/08/1961    Years since quitting: 56.0  . Smokeless  tobacco: Former Systems developer    Quit date: 02/08/1961  Substance Use Topics  . Alcohol use: Yes    Alcohol/week: 4.2 oz    Types: 7 Standard drinks or equivalent per week    Comment: 2ozs Building services engineer daily  . Drug use: No     Allergies   Patient has no known allergies.   Review of Systems Review of Systems  All other systems reviewed and are negative.    Physical Exam Updated Vital Signs BP (!) 160/71 (BP Location: Left Arm)   Pulse 60   Temp (!) 97.4 F (36.3 C) (Oral)   Resp 16   Ht 6' (1.829 m)   Wt 77.1 kg (  170 lb)   SpO2 98%   BMI 23.06 kg/m    Physical Exam  Nursing note and vitals reviewed.  84 year old male, resting comfortably and in no acute distress. Vital signs are significant for elevated systolic blood pressure. Oxygen saturation is 98%, which is normal. Head is normocephalic and atraumatic. PERRLA, EOMI. Oropharynx is clear. Neck is nontender and supple without adenopathy or JVD. Back is nontender and there is no CVA tenderness. Lungs are clear without rales, wheezes, or rhonchi.  Breath sounds are diminished diffusely. Chest is nontender. Heart has regular rate and rhythm without murmur. Abdomen is soft, flat, nontender without masses or hepatosplenomegaly and peristalsis is normoactive. Extremities have no cyanosis or edema, full range of motion is present. Skin is warm and dry without rash. Neurologic: Mental status is normal, cranial nerves are intact, there are no motor or sensory deficits.  ED Treatments / Results  Labs (all labs ordered are listed, but only abnormal results are displayed) Labs Reviewed  COMPREHENSIVE METABOLIC PANEL - Abnormal; Notable for the following components:      Result Value   Sodium 112 (*)    Chloride 80 (*)    CO2 21 (*)    Glucose, Bld 133 (*)    BUN 25 (*)    Creatinine, Ser 1.43 (*)    Calcium 8.8 (*)    Albumin 3.4 (*)    ALT 14 (*)    GFR calc non Af Amer 42 (*)    GFR calc Af Amer 49 (*)    All other components  within normal limits  CBC WITH DIFFERENTIAL/PLATELET - Abnormal; Notable for the following components:   RBC 3.31 (*)    Hemoglobin 10.7 (*)    HCT 29.2 (*)    MCHC 36.6 (*)    Monocytes Absolute 1.1 (*)    All other components within normal limits  NA AND K (SODIUM & POTASSIUM), RAND UR  OSMOLALITY, URINE  OSMOLALITY  LEGIONELLA PNEUMOPHILA SEROGP 1 UR AG  STREP PNEUMONIAE URINARY ANTIGEN  BASIC METABOLIC PANEL   Radiology Ct Chest Wo Contrast  Result Date: 02/19/2017 CLINICAL DATA:  Persistent cough EXAM: CT CHEST WITHOUT CONTRAST TECHNIQUE: Multidetector CT imaging of the chest was performed following the standard protocol without IV contrast. COMPARISON:  Chest x-ray 02/11/2017.  Chest CT 01/19/2014. FINDINGS: Cardiovascular: Heart is enlarged. Moderate coronary artery and aortic calcifications. No evidence of aortic aneurysm. Mediastinum/Nodes: Scattered small mediastinal lymph nodes are stable or improved since 2016. No axillary or hilar adenopathy. Small hiatal hernia. Lungs/Pleura: Biapical scarring patchy airspace opacities in both lungs involving the left upper lobe and left lower lobe and right lower lobe concerning for pneumonia. 6 mm nodule in the right upper lobe on image 51 is new since 2016. No effusions. Upper Abdomen: Imaging into the upper abdomen shows no acute findings. Musculoskeletal: Chest wall soft tissues are unremarkable. No acute bony abnormality. IMPRESSION: Patchy peripheral airspace opacities in both lungs, left greater than right concerning for early pneumonia. Mild cardiomegaly.  Coronary artery disease. 7 mm nodule in the right upper lobe. Non-contrast chest CT at 6-12 months is recommended. If the nodule is stable at time of repeat CT, then future CT at 18-24 months (from today's scan) is considered optional for low-risk patients, but is recommended for high-risk patients. This recommendation follows the consensus statement: Guidelines for Management of Incidental  Pulmonary Nodules Detected on CT Images: From the Fleischner Society 2017; Radiology 2017; 284:228-243. Small hiatal hernia. Aortic Atherosclerosis (ICD10-I70.0).  Electronically Signed   By: Rolm Baptise M.D.   On: 02/19/2017 07:26    Procedures Procedures  CRITICAL CARE Performed by: Delora Fuel Total critical care time: 50 minutes Critical care time was exclusive of separately billable procedures and treating other patients. Critical care was necessary to treat or prevent imminent or life-threatening deterioration. Critical care was time spent personally by me on the following activities: development of treatment plan with patient and/or surrogate as well as nursing, discussions with consultants, evaluation of patient's response to treatment, examination of patient, obtaining history from patient or surrogate, ordering and performing treatments and interventions, ordering and review of laboratory studies, ordering and review of radiographic studies, pulse oximetry and re-evaluation of patient's condition.  Medications Ordered in ED Medications  0.9 %  sodium chloride infusion (not administered)  sodium chloride 0.9 % bolus 1,000 mL (1,000 mLs Intravenous New Bag/Given 02/19/17 0636)     Initial Impression / Assessment and Plan / ED Course  I have reviewed the triage vital signs and the nursing notes.  Pertinent labs & imaging results that were available during my care of the patient were reviewed by me and considered in my medical decision making (see chart for details).  Hyponatremia.  Old records were reviewed confirming ED visit on February 11 at which time sodium was 124, with creatinine 1.49.  Creatinine was actually lower than his baseline of about 2.  Hemoglobin was 11.5, which is above his baseline.  I do not have any access to the records from his PCP.  Will recheck electrolytes and give IV fluids.  I am concerned that there is not an obvious reason for his hyponatremia.  With  smoking history, need to consider possibility of lung cancer with SIADH.  His chest x-ray has considerable scarring from prior surgery, so will send for CT of chest.  Sodium is down to 112.  This will need admission to correct with safe value.  CT of chest shows 6-7 mm nodule in the right upper lobe.  While possibly malignant, doubt tumor this size would produce sufficient SIADH to account for such rapid changes.  He is not on any medications which are commonly associated with SIADH.  Case is discussed with Dr. Reesa Chew of [facility] hospitalist who agrees to admit the patient.  Final Clinical Impressions(s) / ED Diagnoses   Final diagnoses:  Hyponatremia  Renal insufficiency  Normocytic anemia  Lung nodule    ED Discharge Orders    None      Delora Fuel, MD 87/86/76 [phone]

## 2017-02-19 NOTE — Progress Notes (Signed)
PHARMACY NOTE -  Ceftriaxone  Pharmacy has been consulted to dose Ceftriaxone for CAP. MD has already ordered Ceftriaxone 1g IV q24h x 5 days  (and Azithromycin 500 mg IV q24h x 5 days).  No renal adjustment needed; therefore, pharmacy will sign off since need for further dosage adjustment appears unlikely at present.    Please reconsult if a change in clinical status warrants re-evaluation of dosage.  Thank you for the consult. Peggyann Juba, PharmD, BCPS Pharmacy: [phone] 02/19/2017 8:13 AM

## 2017-02-19 NOTE — ED Notes (Signed)
ED TO INPATIENT HANDOFF REPORT  Name/Age/Gender [name] 84 y.o. male  Code Status    Code Status Orders  (From admission, onward)        Start     Ordered   02/19/17 0806  Do not attempt resuscitation (DNR)  Continuous    Question Answer Comment  In the event of cardiac or respiratory ARREST Do not call a "code blue"   In the event of cardiac or respiratory ARREST Do not perform Intubation, CPR, defibrillation or ACLS   In the event of cardiac or respiratory ARREST Use medication by any route, position, wound care, and other measures to relive pain and suffering. May use oxygen, suction and manual treatment of airway obstruction as needed for comfort.      02/19/17 8101    Code Status History    Date Active Date Inactive Code Status Order ID Comments User Context   06/30/2013 21:47 07/01/2013 14:10 Full Code [id number]  Barton Dubois, MD Inpatient   02/15/2012 15:12 02/18/2012 20:08 Full Code [id number]  Erlene Senters, PA Inpatient    Advance Directive Documentation     Most Recent Value  Type of Advance Directive  Healthcare Power of Attorney, Living will  Pre-existing out of facility DNR order (yellow form or pink MOST form)  No data  "MOST" Form in Place?  No data      Home/SNF/Other Home  Chief Complaint abnormal labs  Level of Care/Admitting Diagnosis ED Disposition    ED Disposition Condition Comment   Admit  Hospital Area: [facility] [[id number]]  Level of Care: Telemetry [5]  Admit to tele based on following criteria: Monitor QTC interval  Admit to tele based on following criteria: Complex arrhythmia (Bradycardia/Tachycardia)  Diagnosis: Hyponatremia [361443]  Admitting Physician: Gerlean Ren [facility] [[id number]]  Attending Physician: Gerlean Ren CH[facility]AG [[id number]]  Estimated length of stay: past midnight tomorrow  Certification:: I certify this patient will need inpatient services for at least 2 midnights  PT Class (Do Not Modify):  Inpatient [101]  PT Acc Code (Do Not Modify): Private [1]       Medical History Past Medical History:  Diagnosis Date  . Anemia    low hemoglobin  . Arthritis   . AV (angiodysplasia malformation of colon)   . Benign prostatic hypertrophy   . Chronic kidney disease    elevated Cr, BUN, Dr. Hassell Done manages  . COPD (chronic obstructive pulmonary disease) ([place])   . Coronary artery disease   . Gout   . Heart murmur   . Hyperlipidemia   . Hypertension   . Mitral regurgitation   . Mitral valve prolapse   . PONV (postoperative nausea and vomiting)   . Shortness of breath     Allergies No Known Allergies  IV Location/Drains/Wounds Patient Lines/Drains/Airways Status   Active Line/Drains/Airways    Name:   Placement date:   Placement time:   Site:   Days:   Peripheral IV Right Antecubital   -    -    Antecubital             Labs/Imaging Results for orders placed or performed during the hospital encounter of 02/19/17 (from the past 48 hour(s))  Comprehensive metabolic panel     Status: Abnormal   Collection Time: 02/19/17  6:25 AM  Result Value Ref Range   Sodium 112 (LL) 135 - 145 mmol/L    Comment: CRITICAL RESULT CALLED TO, READ BACK BY AND VERIFIED WITH: J.[facility] RN  [phone] [id number] A.QUIZON    Potassium 4.6 3.5 - 5.1 mmol/L   Chloride 80 (L) 101 - 111 mmol/L   CO2 21 (L) 22 - 32 mmol/L   Glucose, Bld 133 (H) 65 - 99 mg/dL   BUN 25 (H) 6 - 20 mg/dL   Creatinine, Ser 1.43 (H) 0.61 - 1.24 mg/dL   Calcium 8.8 (L) 8.9 - 10.3 mg/dL   Total Protein 6.9 6.5 - 8.1 g/dL   Albumin 3.4 (L) 3.5 - 5.0 g/dL   AST 38 15 - 41 U/L   ALT 14 (L) 17 - 63 U/L   Alkaline Phosphatase 107 38 - 126 U/L   Total Bilirubin 0.7 0.3 - 1.2 mg/dL   GFR calc non Af Amer 42 (L) >60 mL/min   GFR calc Af Amer 49 (L) >60 mL/min    Comment: (NOTE) The eGFR has been calculated using the CKD EPI equation. This calculation has not been validated in all clinical situations. eGFR's persistently <60 mL/min  signify possible Chronic Kidney Disease.    Anion gap 11 5 - 15    Comment: Performed at [facility], [place] [address] [place], [place] [address]  CBC with Differential     Status: Abnormal   Collection Time: 02/19/17  6:25 AM  Result Value Ref Range   WBC 8.0 4.0 - 10.5 K/uL   RBC 3.31 (L) 4.22 - 5.81 MIL/uL   Hemoglobin 10.7 (L) 13.0 - 17.0 g/dL   HCT 29.2 (L) 39.0 - 52.0 %   MCV 88.2 78.0 - 100.0 fL   MCH 32.3 26.0 - 34.0 pg   MCHC 36.6 (H) 30.0 - 36.0 g/dL   RDW 12.8 11.5 - 15.5 %   Platelets 329 150 - 400 K/uL   Neutrophils Relative % 69 %   Neutro Abs 5.5 1.7 - 7.7 K/uL   Lymphocytes Relative 17 %   Lymphs Abs 1.3 0.7 - 4.0 K/uL   Monocytes Relative 14 %   Monocytes Absolute 1.1 (H) 0.1 - 1.0 K/uL   Eosinophils Relative 0 %   Eosinophils Absolute 0.0 0.0 - 0.7 K/uL   Basophils Relative 0 %   Basophils Absolute 0.0 0.0 - 0.1 K/uL    Comment: Performed at [facility], [place] [address], [place], [place] [address]  Na and K (sodium & potassium), rand urine     Status: None   Collection Time: 02/19/17  8:03 AM  Result Value Ref Range   Sodium, Ur 64 mmol/L   Potassium Urine 36 mmol/L    Comment: Performed at [place] [address] [place], [place] [address]  Osmolality, urine     Status: None   Collection Time: 02/19/17  8:03 AM  Result Value Ref Range   Osmolality, Ur 369 300 - 900 mOsm/kg    Comment: REPEATED TO VERIFY CORRECTED RESULTS CALLED TO: Sharlyn Bologna, RN 1136 02/19/2017 BY MACEDA,J. Performed at [facility], [place] [address], [place],  [address] CORRECTED ON 02/19 AT 1138: PREVIOUSLY REPORTED AS 247 REPEATED TO VERIFY   Strep pneumoniae urinary antigen     Status: None   Collection Time: 02/19/17  8:03 AM  Result Value Ref Range   Strep Pneumo Urinary Antigen NEGATIVE NEGATIVE    Comment:        Infection due to S. pneumoniae cannot be absolutely ruled out since the antigen present may be  below the detection limit of the test. Performed at [facility], [address] [place], [place] [address]   Urinalysis, Routine w reflex microscopic     Status: Abnormal   Collection Time: 02/19/17  8:03 AM  Result Value Ref Range   Color, Urine YELLOW YELLOW   APPearance CLEAR CLEAR   Specific Gravity, Urine 1.010 1.005 - 1.030   pH 7.0 5.0 - 8.0   Glucose, UA NEGATIVE NEGATIVE mg/dL   Hgb urine dipstick NEGATIVE NEGATIVE   Bilirubin Urine NEGATIVE NEGATIVE   Ketones, ur NEGATIVE NEGATIVE mg/dL   Protein, ur 100 (A) NEGATIVE mg/dL   Nitrite NEGATIVE NEGATIVE   Leukocytes, UA NEGATIVE NEGATIVE   RBC / HPF 0-5 0 - 5 RBC/hpf   WBC, UA 0-5 0 - 5 WBC/hpf   Bacteria, UA NONE SEEN NONE SEEN   Squamous Epithelial / LPF NONE SEEN NONE SEEN    Comment: Performed at [facility], [place] [address], [place], [place] [address]  Influenza panel by PCR (type A & B)     Status: Abnormal   Collection Time: 02/19/17  8:09 AM  Result Value Ref Range   Influenza A By PCR POSITIVE (A) NEGATIVE   Influenza B By PCR NEGATIVE NEGATIVE    Comment: (NOTE) The Xpert Xpress Flu assay is intended as an aid in the diagnosis of  influenza and should not be used as a sole basis for treatment.  This  assay is FDA approved for nasopharyngeal swab specimens only. Nasal  washings and aspirates are unacceptable for Xpert Xpress Flu testing. Performed at [facility], [place] [address], [place], [place] [address]   Osmolality     Status: Abnormal   Collection Time: 02/19/17  8:42 AM  Result Value Ref Range   Osmolality 247 (LL) 275 - 295 mOsm/kg    Comment: PERFORMED AT [facility] REPEATED TO VERIFY CRITICAL RESULT CALLED TO, READ BACK BY AND VERIFIED WITH: Sharlyn Bologna, RN 1120 02/19/2017 BY MACEDA,J. Performed at [facility], [place] [address] [place], [place] [address]   Basic metabolic panel     Status: Abnormal   Collection Time: 02/19/17  8:42 AM   Result Value Ref Range   Sodium 114 (LL) 135 - 145 mmol/L    Comment: CRITICAL RESULT CALLED TO, READ BACK BY AND VERIFIED WITH: M.QUICK RN [id number] A.QUIZON    Potassium 4.5 3.5 - 5.1 mmol/L   Chloride 83 (L) 101 - 111 mmol/L   CO2 19 (L) 22 - 32 mmol/L   Glucose, Bld 111 (H) 65 - 99 mg/dL   BUN 24 (H) 6 - 20 mg/dL   Creatinine, Ser 1.40 (H) 0.61 - 1.24 mg/dL   Calcium 8.6 (L) 8.9 - 10.3 mg/dL   GFR calc non Af Amer 43 (L) >60 mL/min   GFR calc Af Amer 50 (L) >60 mL/min    Comment: (NOTE) The eGFR has been calculated using the CKD EPI equation. This calculation has not been validated in all clinical situations. eGFR's persistently <60 mL/min signify possible Chronic Kidney Disease.    Anion gap 12 5 - 15    Comment: Performed at [facility], [place] [address] [place], [place] [address]  CBC     Status: Abnormal   Collection Time: 02/19/17  8:42 AM  Result Value Ref Range   WBC 7.3 4.0 - 10.5 K/uL   RBC 3.21 (L) 4.22 - 5.81 MIL/uL   Hemoglobin 10.3 (L) 13.0 - 17.0 g/dL   HCT 28.4 (L) 39.0 -  52.0 %   MCV 88.5 78.0 - 100.0 fL   MCH 32.1 26.0 - 34.0 pg   MCHC 36.3 (H) 30.0 - 36.0 g/dL   RDW 12.9 11.5 - 15.5 %   Platelets 299 150 - 400 K/uL    Comment: Performed at [facility], [place] [address] [place], [place] [address]  Creatinine, serum     Status: Abnormal   Collection Time: 02/19/17  8:42 AM  Result Value Ref Range   Creatinine, Ser 1.37 (H) 0.61 - 1.24 mg/dL   GFR calc non Af Amer 44 (L) >60 mL/min   GFR calc Af Amer 51 (L) >60 mL/min    Comment: (NOTE) The eGFR has been calculated using the CKD EPI equation. This calculation has not been validated in all clinical situations. eGFR's persistently <60 mL/min signify possible Chronic Kidney Disease. Performed at [facility], [place] [address] [place], [place] [address]   CBG monitoring, ED     Status: Abnormal   Collection Time: 02/19/17 12:02 PM  Result  Value Ref Range   Glucose-Capillary 142 (H) 65 - 99 mg/dL   Ct Chest Wo Contrast  Result Date: 02/19/2017 CLINICAL DATA:  Persistent cough EXAM: CT CHEST WITHOUT CONTRAST TECHNIQUE: Multidetector CT imaging of the chest was performed following the standard protocol without IV contrast. COMPARISON:  Chest x-ray 02/11/2017.  Chest CT 01/19/2014. FINDINGS: Cardiovascular: Heart is enlarged. Moderate coronary artery and aortic calcifications. No evidence of aortic aneurysm. Mediastinum/Nodes: Scattered small mediastinal lymph nodes are stable or improved since 2016. No axillary or hilar adenopathy. Small hiatal hernia. Lungs/Pleura: Biapical scarring patchy airspace opacities in both lungs involving the left upper lobe and left lower lobe and right lower lobe concerning for pneumonia. 6 mm nodule in the right upper lobe on image 51 is new since 2016. No effusions. Upper Abdomen: Imaging into the upper abdomen shows no acute findings. Musculoskeletal: Chest wall soft tissues are unremarkable. No acute bony abnormality. IMPRESSION: Patchy peripheral airspace opacities in both lungs, left greater than right concerning for early pneumonia. Mild cardiomegaly.  Coronary artery disease. 7 mm nodule in the right upper lobe. Non-contrast chest CT at 6-12 months is recommended. If the nodule is stable at time of repeat CT, then future CT at 18-24 months (from today's scan) is considered optional for low-risk patients, but is recommended for high-risk patients. This recommendation follows the consensus statement: Guidelines for Management of Incidental Pulmonary Nodules Detected on CT Images: From the Fleischner Society 2017; Radiology 2017; 284:228-243. Small hiatal hernia. Aortic Atherosclerosis (ICD10-I70.0). Electronically Signed   By: Rolm Baptise M.D.   On: 02/19/2017 07:26    Pending Labs Unresulted Labs (From admission, onward)   Start     Ordered   02/20/17 0500  Comprehensive metabolic panel  Tomorrow morning,    R     02/19/17 0807   02/20/17 0500  CBC  Tomorrow morning,   R     02/19/17 0807   02/19/17 4562  Basic metabolic panel  Once-Timed,   R     02/19/17 0807   02/19/17 0817  Culture, expectorated sputum-assessment  Once,   R     02/19/17 0816   02/19/17 0817  Culture, blood (Routine X 2) w Reflex to ID Panel  BLOOD CULTURE X 2,   R     02/19/17 0816   02/19/17 0742  Legionella Pneumophila Serogp 1 Ur Ag  Once,   R     02/19/17 5638  Vitals/Pain Today's Vitals   02/19/17 1203 02/19/17 1330 02/19/17 1500 02/19/17 1630  BP: (!) 146/58 132/60 (!) 148/59 (!) 142/53  Pulse: 67 70 66 (!) 59  Resp: (!) _0 Temp:      TempSrc:      SpO2: 98% 98% 100% 94%  Weight:      Height:        Isolation Precautions Droplet precaution  Medications Medications  0.9 %  sodium chloride infusion ( Intravenous New Bag/Given 02/19/17 0808)  lisinopril (PRINIVIL,ZESTRIL) tablet 40 mg (40 mg Oral Given 02/19/17 1219)  allopurinol (ZYLOPRIM) tablet 150 mg (150 mg Oral Given 02/19/17 1219)  diltiazem (CARDIZEM CD) 24 hr capsule 300 mg (300 mg Oral Given 02/19/17 1219)  aspirin EC tablet 81 mg (81 mg Oral Given 02/19/17 1219)  atorvastatin (LIPITOR) tablet 10 mg (not administered)  heparin injection 5,000 Units (5,000 Units Subcutaneous Given 02/19/17 1411)  0.9 %  sodium chloride infusion ( Intravenous Not Given 02/19/17 0815)  acetaminophen (TYLENOL) tablet 650 mg (not administered)    Or  acetaminophen (TYLENOL) suppository 650 mg (not administered)  HYDROcodone-acetaminophen (NORCO/VICODIN) 5-325 MG per tablet 1-2 tablet (not administered)  senna-docusate (Senokot-S) tablet 1 tablet (not administered)  bisacodyl (DULCOLAX) EC tablet 5 mg (not administered)  ondansetron (ZOFRAN) tablet 4 mg (not administered)    Or  ondansetron (ZOFRAN) injection 4 mg (not administered)  insulin aspart (novoLOG) injection 0-9 Units (1 Units Subcutaneous Given 02/19/17 1236)  insulin aspart (novoLOG)  injection 0-5 Units (not administered)  ipratropium-albuterol (DUONEB) 0.5-2.5 (3) MG/3ML nebulizer solution 3 mL (3 mLs Nebulization Given 02/19/17 1450)  azithromycin (ZITHROMAX) 500 mg in sodium chloride 0.9 % 250 mL IVPB (0 mg Intravenous Stopped 02/19/17 1007)  cefTRIAXone (ROCEPHIN) 1 g in sodium chloride 0.9 % 100 mL IVPB (0 g Intravenous Stopped 02/19/17 1121)  loratadine (CLARITIN) tablet 10 mg (not administered)  lip balm (CARMEX) ointment 1 application (not administered)  polyvinyl alcohol (LIQUIFILM TEARS) 1.4 % ophthalmic solution 1 drop (not administered)  hydrocortisone (ANUSOL-HC) 2.5 % rectal cream 1 application (not administered)  alum & mag hydroxide-simeth (MAALOX/MYLANTA) 200-200-20 MG/5ML suspension 30 mL (not administered)  hydrocortisone cream 1 % 1 application (not administered)  MUSCLE RUB CREA 1 application (not administered)  sodium chloride (OCEAN) 0.65 % nasal spray 1 spray (not administered)  phenol (CHLORASEPTIC) mouth spray 1 spray (not administered)  guaiFENesin-dextromethorphan (ROBITUSSIN DM) 100-10 MG/5ML syrup 5 mL (not administered)  omeprazole (PRILOSEC) capsule 20 mg (not administered)  sodium chloride 0.9 % bolus 1,000 mL (0 mLs Intravenous Stopped 02/19/17 1120)    Mobility walks

## 2017-02-19 NOTE — Progress Notes (Addendum)
CRITICAL VALUE ALERT  Critical Value: Na 117  Date & Time Notied:  02/19/17;1955  Provider Notified: yes  Orders Received/Actions taken:Awaiting any new orders

## 2017-02-20 ENCOUNTER — Inpatient Hospital Stay (HOSPITAL_COMMUNITY): Payer: PPO

## 2017-02-20 DIAGNOSIS — E871 Hypo-osmolality and hyponatremia: Secondary | ICD-10-CM

## 2017-02-20 DIAGNOSIS — R911 Solitary pulmonary nodule: Secondary | ICD-10-CM

## 2017-02-20 DIAGNOSIS — J101 Influenza due to other identified influenza virus with other respiratory manifestations: Secondary | ICD-10-CM

## 2017-02-20 LAB — GLUCOSE, CAPILLARY
GLUCOSE-CAPILLARY: 173 mg/dL — AB (ref 65–99)
GLUCOSE-CAPILLARY: 114 mg/dL — AB (ref 65–99)
Glucose-Capillary: 99 mg/dL (ref 65–99)
Glucose-Capillary: 130 mg/dL — ABNORMAL HIGH (ref 65–99)

## 2017-02-20 LAB — COMPREHENSIVE METABOLIC PANEL
ALT: 13 U/L — ABNORMAL LOW (ref 17–63)
AST: 34 U/L (ref 15–41)
Albumin: 3 g/dL — ABNORMAL LOW (ref 3.5–5.0)
Alkaline Phosphatase: 91 U/L (ref 38–126)
Anion gap: 10 (ref 5–15)
BUN: 20 mg/dL (ref 6–20)
CHLORIDE: 90 mmol/L — AB (ref 101–111)
CO2: 21 mmol/L — AB (ref 22–32)
CREATININE: 1.34 mg/dL — AB (ref 0.61–1.24)
Calcium: 8.7 mg/dL — ABNORMAL LOW (ref 8.9–10.3)
GFR calc Af Amer: 52 mL/min — ABNORMAL LOW (ref >60)
GFR calc non Af Amer: 45 mL/min — ABNORMAL LOW (ref >60)
Glucose, Bld: 99 mg/dL (ref 65–99)
Potassium: 4.4 mmol/L (ref 3.5–5.1)
SODIUM: 121 mmol/L — AB (ref 135–145)
Total Bilirubin: 0.6 mg/dL (ref 0.3–1.2)
Total Protein: 6.1 g/dL — ABNORMAL LOW (ref 6.5–8.1)

## 2017-02-20 LAB — CBC
HCT: 26.2 % — ABNORMAL LOW (ref 39.0–52.0)
Hemoglobin: 9.4 g/dL — ABNORMAL LOW (ref 13.0–17.0)
MCH: 32.1 pg (ref 26.0–34.0)
MCHC: 35.9 g/dL (ref 30.0–36.0)
MCV: 89.4 fL (ref 78.0–100.0)
Platelets: 268 10*3/uL (ref 150–400)
RBC: 2.93 MIL/uL — AB (ref 4.22–5.81)
RDW: 13.1 % (ref 11.5–15.5)
WBC: 5.9 10*3/uL (ref 4.0–10.5)

## 2017-02-20 LAB — LEGIONELLA PNEUMOPHILA SEROGP 1 UR AG: L. pneumophila Serogp 1 Ur Ag: NEGATIVE

## 2017-02-20 LAB — SODIUM: Sodium: 119 mmol/L — CL (ref 135–145)

## 2017-02-20 MED ORDER — IPRATROPIUM-ALBUTEROL 0.5-2.5 (3) MG/3ML IN SOLN
3.0000 mL | Freq: Three times a day (TID) | RESPIRATORY_TRACT | Status: DC
  Administered 2017-02-20 – 2017-02-22 (×6): 3 mL via RESPIRATORY_TRACT
  Filled 2017-02-20 (×8): qty 3

## 2017-02-20 MED ORDER — BISACODYL 10 MG RE SUPP
10.0000 mg | Freq: Every day | RECTAL | Status: AC
  Administered 2017-02-21 – 2017-02-22 (×2): 10 mg via RECTAL
  Filled 2017-02-20 (×2): qty 1

## 2017-02-20 MED ORDER — OSELTAMIVIR PHOSPHATE 30 MG PO CAPS
30.0000 mg | ORAL_CAPSULE | Freq: Two times a day (BID) | ORAL | Status: DC
  Administered 2017-02-20 – 2017-02-24 (×8): 30 mg via ORAL
  Filled 2017-02-20 (×8): qty 1

## 2017-02-20 MED ORDER — SENNOSIDES-DOCUSATE SODIUM 8.6-50 MG PO TABS
2.0000 | ORAL_TABLET | Freq: Two times a day (BID) | ORAL | Status: DC
  Administered 2017-02-20 – 2017-02-22 (×5): 2 via ORAL
  Filled 2017-02-20 (×6): qty 2

## 2017-02-20 MED ORDER — POLYETHYLENE GLYCOL 3350 17 G PO PACK
17.0000 g | PACK | Freq: Two times a day (BID) | ORAL | Status: DC
  Administered 2017-02-20 – 2017-02-22 (×5): 17 g via ORAL
  Filled 2017-02-20 (×6): qty 1

## 2017-02-20 NOTE — Progress Notes (Signed)
Pt with distended abdomen and nausea. Updated MD. Stat KUB ordered

## 2017-02-20 NOTE — Progress Notes (Signed)
PROGRESS NOTE  [name] QJJ:[id number] DOB: 09-May-1925 DOA: 02/19/2017 PCP: [name], MD  HPI/Recap of past 24 hours:  Feeling better  Assessment/Plan: Active Problems:   Hyponatremia  Generalized weakness Community-acquired pneumonia, failed outpatient treatment/+ fluA - Has failed a round of azithromycin and doxycycline for 5 days each outpatient - blood cultures no growth, UA and sputum cultures prior to starting antibiotics.  Rocephin and azithromycin - start tamiflu  Hyponatremia; suspect hypovolemia -Likely secondary to dehydration from poor p.o. intake over 2 weeks - 1 L normal saline given in the ER saline at 75 cc/h -improving on ivf, continue, repeat lab in am   7 mm pulmonary nodule -Quit smoking at least 50 years ago.  He will need a repeat scan in about 12 months without contrast to further evaluate and monitor this  Coronary artery disease Mitral valve repair - This appears to be stable -We will continue his home aspirin, statin, Cardizem and lisinopril  GERD -Continue omeprazole 20 mg  History of gout -On allopurinol  CKD stage III -Creatinine is 1.43 which is somewhat close to his baseline    DVT prophylaxis: Hep SQ Code Status: DNR Family Communication:  daughter and Wife at beside  Disposition Plan:  pending Consults called: None   Procedures:  none  Antibiotics:  As above   Objective: BP (!) 150/55 (BP Location: Left Arm)   Pulse 72   Temp 98 F (36.7 C) (Oral)   Resp 18   Ht 6' (1.829 m)   Wt 74.3 kg (163 lb 12.8 oz)   SpO2 99%   BMI 22.22 kg/m   Intake/Output Summary (Last 24 hours) at 02/20/2017 1633 Last data filed at 02/20/2017 0740 Gross per 24 hour  Intake 600 ml  Output 1150 ml  Net -550 ml   Filed Weights   02/19/17 0624 02/19/17 1728  Weight: 77.1 kg (170 lb) 74.3 kg (163 lb 12.8 oz)    Exam: Patient is examined daily including today on 02/20/2017, exams remain the same as of  yesterday except that has changed    General:  NAD  Cardiovascular: RRR  Respiratory: CTABL  Abdomen: Soft/ND/NT, positive BS  Musculoskeletal: No Edema  Neuro: alert, oriented   Data Reviewed: Basic Metabolic Panel: Recent Labs  Lab 02/19/17 0625 02/19/17 0842 02/19/17 1845 02/19/17 2339 02/20/17 0537  NA 112* 114* 117* 119* 121*  K 4.6 4.5 4.6  --  4.4  CL 80* 83* 86*  --  90*  CO2 21* 19* 21*  --  21*  GLUCOSE 133* 111* 177*  --  99  BUN 25* 24* 22*  --  20  CREATININE 1.43* 1.40*  1.37* 1.42*  --  1.34*  CALCIUM 8.8* 8.6* 8.7*  --  8.7*   Liver Function Tests: Recent Labs  Lab 02/19/17 0625 02/20/17 0537  AST 38 34  ALT 14* 13*  ALKPHOS 107 91  BILITOT 0.7 0.6  PROT 6.9 6.1*  ALBUMIN 3.4* 3.0*   No results for input(s): LIPASE, AMYLASE in the last 168 hours. No results for input(s): AMMONIA in the last 168 hours. CBC: Recent Labs  Lab 02/19/17 0625 02/19/17 0842 02/20/17 0537  WBC 8.0 7.3 5.9  NEUTROABS 5.5  --   --   HGB 10.7* 10.3* 9.4*  HCT 29.2* 28.4* 26.2*  MCV 88.2 88.5 89.4  PLT 329 299 268   Cardiac Enzymes:   No results for input(s): CKTOTAL, CKMB, CKMBINDEX, TROPONINI in the last 168 hours. BNP (last  3 results) No results for input(s): BNP in the last 8760 hours.  ProBNP (last 3 results) No results for input(s): PROBNP in the last 8760 hours.  CBG: Recent Labs  Lab 02/19/17 1202 02/19/17 1710 02/19/17 2132 02/20/17 0812 02/20/17 1209  GLUCAP 142* 132* 134* 99 130*    Recent Results (from the past 240 hour(s))  Culture, blood (Routine X 2) w Reflex to ID Panel     Status: None (Preliminary result)   Collection Time: 02/19/17  8:50 AM  Result Value Ref Range Status   Specimen Description   Final    BLOOD RIGHT ANTECUBITAL Performed at [place] [address] [place], [place] [address]    Special Requests   Final    BOTTLES DRAWN AEROBIC AND ANAEROBIC Blood Culture adequate volume Performed  at [facility] [address], [place], [place] [address]    Culture   Final    NO GROWTH 1 DAY Performed at [facility], [place] [address] [place], [place] [address]    Report Status PENDING  Incomplete  Culture, blood (Routine X 2) w Reflex to ID Panel     Status: None (Preliminary result)   Collection Time: 02/19/17  6:45 PM  Result Value Ref Range Status   Specimen Description   Final    BLOOD LEFT ANTECUBITAL Performed at [facility] [address] [place], [place] [address]    Special Requests   Final    BOTTLES DRAWN AEROBIC ONLY Blood Culture adequate volume Performed at [facility] [address] [place], [place] [address]    Culture   Final    NO GROWTH < 24 HOURS Performed at [facility] [address], [place],  [address]    Report Status PENDING  Incomplete     Studies: No results found.  Scheduled Meds: . allopurinol  150 mg Oral Daily  . aspirin EC  81 mg Oral Daily  . atorvastatin  10 mg Oral q1800  . diltiazem  300 mg Oral Daily  . feeding supplement (ENSURE ENLIVE)  237 mL Oral BID BM  . heparin  5,000 Units Subcutaneous Q8H  . insulin aspart  0-5 Units Subcutaneous QHS  . insulin aspart  0-9 Units Subcutaneous TID WC  . ipratropium-albuterol  3 mL Nebulization TID  . lisinopril  40 mg Oral Daily  . omeprazole  20 mg Oral QODAY    Continuous Infusions: . sodium chloride 75 mL/hr at 02/20/17 0927  . azithromycin Stopped (02/20/17 1041)  . cefTRIAXone (ROCEPHIN)  IV Stopped (02/20/17 1206)     Time spent: 25 mins I have personally reviewed and interpreted on  02/20/2017 daily labs, tele strips, imagings as discussed above under date review session and assessment and plans.  I reviewed all nursing notes, pharmacy notes, vitals, pertinent old records  I have discussed plan of care as described above with RN , patient and family on 02/20/2017   Florencia Reasons MD,  PhD  [facility] Pager [phone]. If 7PM-7AM, please contact night-coverage at [email or web address], password [facility] 02/20/2017, 4:33 PM  LOS: 1 day

## 2017-02-20 NOTE — Progress Notes (Signed)
CRITICAL VALUE ALERT  Critical Value: Na 119  Date & Time Notied:  02/20/17;0400  Provider Notified: yes  Orders Received/Actions taken: awaiting any new orders

## 2017-02-21 DIAGNOSIS — N183 Chronic kidney disease, stage 3 (moderate): Secondary | ICD-10-CM

## 2017-02-21 DIAGNOSIS — J189 Pneumonia, unspecified organism: Secondary | ICD-10-CM

## 2017-02-21 LAB — BASIC METABOLIC PANEL
Anion gap: 9 (ref 5–15)
BUN: 21 mg/dL — AB (ref 6–20)
CHLORIDE: 92 mmol/L — AB (ref 101–111)
CO2: 21 mmol/L — ABNORMAL LOW (ref 22–32)
CREATININE: 1.35 mg/dL — AB (ref 0.61–1.24)
Calcium: 8.6 mg/dL — ABNORMAL LOW (ref 8.9–10.3)
GFR calc Af Amer: 52 mL/min — ABNORMAL LOW (ref >60)
GFR calc non Af Amer: 45 mL/min — ABNORMAL LOW (ref >60)
GLUCOSE: 120 mg/dL — AB (ref 65–99)
POTASSIUM: 4.3 mmol/L (ref 3.5–5.1)
Sodium: 122 mmol/L — ABNORMAL LOW (ref 135–145)

## 2017-02-21 LAB — GLUCOSE, CAPILLARY
GLUCOSE-CAPILLARY: 145 mg/dL — AB (ref 65–99)
GLUCOSE-CAPILLARY: 146 mg/dL — AB (ref 65–99)
Glucose-Capillary: 113 mg/dL — ABNORMAL HIGH (ref 65–99)
Glucose-Capillary: 126 mg/dL — ABNORMAL HIGH (ref 65–99)

## 2017-02-21 LAB — MRSA PCR SCREENING: MRSA BY PCR: NEGATIVE

## 2017-02-21 LAB — TSH: TSH: 1.427 u[IU]/mL (ref 0.350–4.500)

## 2017-02-21 LAB — MAGNESIUM: Magnesium: 1.4 mg/dL — ABNORMAL LOW (ref 1.7–2.4)

## 2017-02-21 MED ORDER — ADULT MULTIVITAMIN W/MINERALS CH
1.0000 | ORAL_TABLET | Freq: Every day | ORAL | Status: DC
  Administered 2017-02-21 – 2017-02-24 (×4): 1 via ORAL
  Filled 2017-02-21 (×4): qty 1

## 2017-02-21 MED ORDER — MAGNESIUM SULFATE 2 GM/50ML IV SOLN
2.0000 g | Freq: Once | INTRAVENOUS | Status: AC
  Administered 2017-02-21: 2 g via INTRAVENOUS
  Filled 2017-02-21: qty 50

## 2017-02-21 MED ORDER — SODIUM BICARBONATE 650 MG PO TABS
650.0000 mg | ORAL_TABLET | Freq: Three times a day (TID) | ORAL | Status: DC
  Administered 2017-02-21 – 2017-02-24 (×9): 650 mg via ORAL
  Filled 2017-02-21 (×9): qty 1

## 2017-02-21 NOTE — Evaluation (Signed)
Physical Therapy Evaluation Patient Details Name: [name] MRN: [id number] DOB: Jun 07, 1925 Today's Date: 02/21/2017   History of Present Illness  84 y.o. male admitted on 02/19/17 for progressive weakness. Dx with CAP,  fluA (+), and hyponatremia.  Pt with significant PMH of bil TKA, MVP, HTN, gout, CAD, COPD, CKD, and anemia.  Clinical Impression  Pt is generally weak and deconditioned from active illness.  He is requiring the use of a cane and is more unsteady on his feet than his usual baseline.  I gave him exercises to do while here and instructions on how to stay active in the hospital.  If he is still here on Monday we will try to progress to gait without AD and balance exercises.   PT to follow acutely for deficits listed below.       Follow Up Recommendations No PT follow up;Supervision - Intermittent    Equipment Recommendations  None recommended by PT    Recommendations for Other Services       Precautions / Restrictions Precautions Precautions: Fall Precaution Comments: mildly unsteady on his feet.      Mobility  Bed Mobility Overal bed mobility: Independent                Transfers Overall transfer level: Needs assistance   Transfers: Sit to/from Stand Sit to Stand: Supervision         General transfer comment: supervision for safety as pt is immediately reaching for support with his hands (i.e. furniture walking)  Ambulation/Gait Ambulation/Gait assistance: Min guard Ambulation Distance (Feet): 180 Feet Assistive device: Straight cane Gait Pattern/deviations: Step-through pattern;Staggering left;Staggering right     General Gait Details: Pt with mildly staggering gait pattern with cane         Balance Overall balance assessment: Needs assistance Sitting-balance support: Feet supported;No upper extremity supported Sitting balance-Leahy Scale: Good     Standing balance support: Single extremity supported Standing balance-Leahy Scale:  Fair Standing balance comment: reaching for external support for balance in standing.                              Pertinent Vitals/Pain Pain Assessment: No/denies pain    Home Living Family/patient expects to be discharged to:: Private residence Living Arrangements: Spouse/significant other Available Help at Discharge: Family;Available 24 hours/day Type of Home: House(townhome) Home Access: Level entry     Home Layout: One level Home Equipment: Cane - single point;Walker - 2 wheels;Bedside commode      Prior Function Level of Independence: Independent         Comments: drives, goes to the gym 3 days a week to exercise on the machines with his wife.       Hand Dominance        Extremity/Trunk Assessment   Upper Extremity Assessment Upper Extremity Assessment: Generalized weakness    Lower Extremity Assessment Lower Extremity Assessment: Generalized weakness    Cervical / Trunk Assessment Cervical / Trunk Assessment: Normal  Communication   Communication: HOH  Cognition Arousal/Alertness: Awake/alert Behavior During Therapy: WFL for tasks assessed/performed Overall Cognitive Status: Within Functional Limits for tasks assessed                                        General Comments General comments (skin integrity, edema, etc.): Pt's O2 sat and HR were normal during  gait on RA    Exercises General Exercises - Lower Extremity Long Arc Quad: AROM;Both;10 reps;Seated Hip Flexion/Marching: AROM;Both;10 reps;Seated;Standing Toe Raises: AROM;Both;10 reps;Seated;Standing Heel Raises: AROM;Both;10 reps;Seated Mini-Sqauts: AROM;Both;10 reps;Standing Other Exercises Other Exercises: Asked pt to sit upright for all of his meals, sit in the chair TID for at least and hour and walk the hallway with supervision, his cane, and a mask on at least one time per day, and do the above listed exercises (I printed a handout) three times a day while  supervised.    Assessment/Plan    PT Assessment Patient needs continued PT services  PT Problem List Decreased strength;Decreased activity tolerance;Decreased balance;Decreased mobility;Decreased knowledge of use of DME       PT Treatment Interventions DME instruction;Gait training;Functional mobility training;Therapeutic activities;Therapeutic exercise;Balance training;Neuromuscular re-education;Patient/family education    PT Goals (Current goals can be found in the Care Plan section)  Acute Rehab PT Goals Patient Stated Goal: to get back to his normal, active lifestyle PT Goal Formulation: With patient/family Time For Goal Achievement: 03/07/17 Potential to Achieve Goals: Good    Frequency Min 3X/week           AM-PAC PT "6 Clicks" Daily Activity  Outcome Measure Difficulty turning over in bed (including adjusting bedclothes, sheets and blankets)?: None Difficulty moving from lying on back to sitting on the side of the bed? : None Difficulty sitting down on and standing up from a chair with arms (e.g., wheelchair, bedside commode, etc,.)?: A Little Help needed moving to and from a bed to chair (including a wheelchair)?: A Little Help needed walking in hospital room?: A Little Help needed climbing 3-5 steps with a railing? : A Little 6 Click Score: 20    End of Session   Activity Tolerance: Patient limited by fatigue Patient left: in chair;with call bell/phone within reach;with family/visitor present Nurse Communication: Mobility status PT Visit Diagnosis: Muscle weakness (generalized) (M62.81);Difficulty in walking, not elsewhere classified (R26.2);Unsteadiness on feet (R26.81)    Time: 5625-6389 PT Time Calculation (min) (ACUTE ONLY): 35 min   Charges:       Wells Guiles B. Quanta Robertshaw, PT, DPT [phone]    PT Evaluation $PT Eval Moderate Complexity: 1 Mod PT Treatments $Gait Training: 8-22 mins   02/21/2017, 3:41 PM

## 2017-02-21 NOTE — Progress Notes (Addendum)
PROGRESS NOTE  [name] IOX:[id number] DOB: 07-19-1925 DOA: 02/19/2017 PCP: [name], MD  Brief summary: patient reports started to have flu symptom early this month , he was tested + for flu A on 2/5, he could not remember he took tamiflu or not. was given azithromycin. He continued to have upper respiratory symptom, he was seen in the ED on 2/11 was given doxycycline. he continued to have symptom, he presented to the ED on 2/19 found to have " Patchy peripheral airspace opacities in both lungs, left greater than right concerning for early pneumonia on CT chest  and severe hyponatremia. He is tested positive for influenza A again.  HPI/Recap of past 24 hours:  Continue to have dry cough, denies pain, no fever Overall remain weak, but reports Feeling better Family at bedside  Assessment/Plan: Active Problems:   Hyponatremia  Generalized weakness, Community-acquired pneumonia, failed outpatient treatment/+ fluA -ct chest on presentation "Patchy peripheral airspace opacities in both lungs, left greater than right concerning for early pneumonia." -- blood cultures no growth, UA unremarkable, urine strep peneumo antigen negative, urine legionella antigen negative.  sputum not collected, cough is nonproductive.  -he was started on  Rocephin and azithromycin, plan to finish 7 days treatment, today day 3. - started on tamiflu  Severe Hyponatremia; suspect hypovolemia  -sodium 112 on presentation.  Likely secondary to dehydration from poor p.o. intake over 2 weeks - improving on ivf, continue, repeat lab in am  Hypomagnesemia: replace mag  H/o COPD: no wheezing, no hypoxia. Not on meds at home. Quit smoking a long time ago.   7 mm pulmonary nodule -Quit smoking at least 50 years ago.  He will need a repeat scan in about 12 months without contrast to further evaluate and monitor this -status post left lower lobectomy for benign tumor in 1980  CKD stage  III -Creatinine is 1.43 which is somewhat close to his baseline -has chronic metabolic acidosis, likely from ckd, will start sodium bicarb.  Coronary artery disease, Mitral valve repair - This appears to be stable -We will continue his home aspirin, statin, Cardizem and lisinopril  GERD -Continue omeprazole 20 mg  History of gout -On allopurinol   constipation: stool softener   DVT prophylaxis: Hep SQ Code Status: DNR Family Communication:  daughter and Wife at beside  Disposition Plan:  pending Consults called: case discussed with infectious disease over the phone on 2/21   Procedures:  none  Antibiotics:  Rocephin/zithro since admission   Objective: BP (!) 140/48   Pulse (!) 57   Temp 97.6 F (36.4 C) (Oral)   Resp 18   Ht 6' (1.829 m)   Wt 75.4 kg (166 lb 3.6 oz)   SpO2 98%   BMI 22.54 kg/m   Intake/Output Summary (Last 24 hours) at 02/21/2017 1423 Last data filed at 02/21/2017 1010 Gross per 24 hour  Intake 1200 ml  Output 1775 ml  Net -575 ml   Filed Weights   02/19/17 0624 02/19/17 1728 02/21/17 0507  Weight: 77.1 kg (170 lb) 74.3 kg (163 lb 12.8 oz) 75.4 kg (166 lb 3.6 oz)    Exam: Patient is examined daily including today on 02/21/2017, exams remain the same as of yesterday except that has changed    General:  NAD  Cardiovascular: RRR  Respiratory: CTABL  Abdomen: Soft/ND/NT, positive BS  Musculoskeletal: No Edema  Neuro: alert, oriented   Data Reviewed: Basic Metabolic Panel: Recent Labs  Lab 02/19/17 0625 02/19/17 0842 02/19/17  1845 02/19/17 2339 02/20/17 0537 02/21/17 0515  NA 112* 114* 117* 119* 121* 122*  K 4.6 4.5 4.6  --  4.4 4.3  CL 80* 83* 86*  --  90* 92*  CO2 21* 19* 21*  --  21* 21*  GLUCOSE 133* 111* 177*  --  99 120*  BUN 25* 24* 22*  --  20 21*  CREATININE 1.43* 1.40*  1.37* 1.42*  --  1.34* 1.35*  CALCIUM 8.8* 8.6* 8.7*  --  8.7* 8.6*  MG  --   --   --   --   --  1.4*   Liver Function  Tests: Recent Labs  Lab 02/19/17 0625 02/20/17 0537  AST 38 34  ALT 14* 13*  ALKPHOS 107 91  BILITOT 0.7 0.6  PROT 6.9 6.1*  ALBUMIN 3.4* 3.0*   No results for input(s): LIPASE, AMYLASE in the last 168 hours. No results for input(s): AMMONIA in the last 168 hours. CBC: Recent Labs  Lab 02/19/17 0625 02/19/17 0842 02/20/17 0537  WBC 8.0 7.3 5.9  NEUTROABS 5.5  --   --   HGB 10.7* 10.3* 9.4*  HCT 29.2* 28.4* 26.2*  MCV 88.2 88.5 89.4  PLT 329 299 268   Cardiac Enzymes:   No results for input(s): CKTOTAL, CKMB, CKMBINDEX, TROPONINI in the last 168 hours. BNP (last 3 results) No results for input(s): BNP in the last 8760 hours.  ProBNP (last 3 results) No results for input(s): PROBNP in the last 8760 hours.  CBG: Recent Labs  Lab 02/20/17 1209 02/20/17 1721 02/20/17 2147 02/21/17 0743 02/21/17 1141  GLUCAP 130* 173* 114* 113* 145*    Recent Results (from the past 240 hour(s))  Culture, blood (Routine X 2) w Reflex to ID Panel     Status: None (Preliminary result)   Collection Time: 02/19/17  8:50 AM  Result Value Ref Range Status   Specimen Description   Final    BLOOD RIGHT ANTECUBITAL Performed at [facility], [place] [address] [place], [place] [address]    Special Requests   Final    BOTTLES DRAWN AEROBIC AND ANAEROBIC Blood Culture adequate volume Performed at [place] [address], [place], [place] [address]    Culture   Final    NO GROWTH 1 DAY Performed at [facility], [place] [address] [place], [place] [address]    Report Status PENDING  Incomplete  Culture, blood (Routine X 2) w Reflex to ID Panel     Status: None (Preliminary result)   Collection Time: 02/19/17  6:45 PM  Result Value Ref Range Status   Specimen Description   Final    BLOOD LEFT ANTECUBITAL Performed at [place] [address], [place], [place] [address]    Special Requests   Final    BOTTLES DRAWN  AEROBIC ONLY Blood Culture adequate volume Performed at [place] [address] [place], [place] [address]    Culture   Final    NO GROWTH < 24 HOURS Performed at [place] [address] [place],  [address]    Report Status PENDING  Incomplete     Studies: Dg Abd 1 View  Result Date: 02/20/2017 CLINICAL DATA:  Right-sided abdominal pain and distention. EXAM: ABDOMEN - 1 VIEW COMPARISON:  None. FINDINGS: Significant amount of fecal retention is seen along the right colon from cecum through proximal transverse colon. Gaseous distention noted from mid to  distal transverse through proximal descending colon with lesser degree of stool burden along the mid to distal descending and proximal sigmoid colon. No significant small bowel dilatation. Calcification in the right hemipelvis more likely to represent a small phlebolith. Additional phlebolith versus diverticulum projects over the left iliac bone. There is lumbar spondylosis with levoscoliosis from L2 through S1. IMPRESSION: 1. Findings consistent with constipation. 2. Levoscoliosis of the lumbar spine with multilevel degenerative disc and facet arthropathy consistent spondylosis. Electronically Signed   By: Ashley Royalty M.D.   On: 02/20/2017 16:56    Scheduled Meds: . allopurinol  150 mg Oral Daily  . aspirin EC  81 mg Oral Daily  . atorvastatin  10 mg Oral q1800  . bisacodyl  10 mg Rectal Daily  . diltiazem  300 mg Oral Daily  . feeding supplement (ENSURE ENLIVE)  237 mL Oral BID BM  . heparin  5,000 Units Subcutaneous Q8H  . insulin aspart  0-5 Units Subcutaneous QHS  . insulin aspart  0-9 Units Subcutaneous TID WC  . ipratropium-albuterol  3 mL Nebulization TID  . lisinopril  40 mg Oral Daily  . multivitamin with minerals  1 tablet Oral Daily  . omeprazole  20 mg Oral QODAY  . oseltamivir  30 mg Oral BID  . polyethylene glycol  17 g Oral BID  . senna-docusate  2 tablet Oral BID  . sodium  bicarbonate  650 mg Oral TID    Continuous Infusions: . sodium chloride 75 mL/hr at 02/21/17 1357  . azithromycin Stopped (02/21/17 2703)  . cefTRIAXone (ROCEPHIN)  IV Stopped (02/21/17 1108)     Time spent: 35 mins I have personally reviewed and interpreted on  02/21/2017 daily labs, tele strips, imagings as discussed above under date review session and assessment and plans.  I reviewed all nursing notes, pharmacy notes, vitals, pertinent old records  I have discussed plan of care as described above with RN , patient and family on 02/21/2017   Florencia Reasons MD, PhD  [facility] Pager [phone]. If 7PM-7AM, please contact night-coverage at [email or web address], password [facility] 02/21/2017, 2:23 PM  LOS: 2 days

## 2017-02-21 NOTE — Progress Notes (Signed)
Initial Nutrition Assessment  DOCUMENTATION CODES:   Not applicable  INTERVENTION:  - Continue Ensure Enlive BID, each supplement provides 350 kcal and 20 grams of protein - Will order daily multivitamin with minerals. - Continue to encourage PO intakes.   NUTRITION DIAGNOSIS:   Inadequate oral intake related to acute illness as evidenced by per patient/family report.  GOAL:   Patient will meet greater than or equal to 90% of their needs  MONITOR:   PO intake, Supplement acceptance, Weight trends, Labs  REASON FOR ASSESSMENT:   Malnutrition Screening Tool  ASSESSMENT:   84 y.o. male with medical history significant of mitral valve repair hyperlipidemia, hypertension, CAD, CKD stage III came to the hospital with complains of generalized weakness and fatigue. Patient states he was diagnosed of possible bacterial bronchitis about 2 weeks ago. He has been tested for the flu and found to be positive.   BMI indicates normal weight. Per chart review, pt consumed 100% of dinner on 2/19. No other intakes documented since admission. He reports good appetite at baseline; he eats 3 meals/day which his wife cooks and he reports that they try to make meals that are healthy. About 2 weeks ago he started to feel sick and appetite began to decline. He reports that since that time he has not been eating well and has had a poor appetite with a decrease in taste sensation.   He reports being able to eat well for dinner last night and that this AM he consumed 100% of breakfast (blueberry muffin, scrambled eggs, and breakfast potatoes). He plans to drink an Ensure for lunch. He was drinking Ensure at home over the past 2 weeks but does not usually drink it.   Pt reports that he found out about 10 days ago that he has low serum Na and was encouraged to eat salty items, such a potato chips. He was eating a bag of chips when RD entered the room. He reports that he was unable to eat much after finding out  about need to increase Na in his diet.   He weighs himself most days and reports UBW of 170 lbs. He last weighed this ~2 weeks PTA. Based on current weight, he has lost 4 lbs (2% body weight) in the past 2 weeks which is not significant for time frame.  Medications reviewed; sliding scale Novolog, 2 g IV Mg sulfate x1 run today, 30 g Tamiflu BID, 1 packet Miralax BID, 2 tablets Senokot BID.  Labs reviewed; CBGs; 113 and 145 mg/dL this AM, Na: 122 mmol/L, creatinine: 1.35 mg/dL, Ca: 8.6 mg/dL, Mg: 1.4 mg/dL, GFR: 45 mL/min.   IVF: NS @ 75 mL/hr.      NUTRITION - FOCUSED PHYSICAL EXAM:  Completed/assessed; no muscle and no fat wasting noted at this time.   Diet Order:  Diet Heart Room service appropriate? Yes with Assist; Fluid consistency: Thin  EDUCATION NEEDS:   No education needs have been identified at this time  Skin:  Skin Assessment: Reviewed RN Assessment  Last BM:  2/20  Height:   Ht Readings from Last 1 Encounters:  02/19/17 6' (1.829 m)    Weight:   Wt Readings from Last 1 Encounters:  02/21/17 166 lb 3.6 oz (75.4 kg)    Ideal Body Weight:  80.91 kg  BMI:  Body mass index is 22.54 kg/m.  Estimated Nutritional Needs:   Kcal:  9163-8466 (22-25 kcal/kg)  Protein:  70-80 grams  Fluid:  >/= 2 L/day  Jarome Matin, MS, RD, LDN, [facility] Clinical Dietitian Pager # [phone] After hours/weekend pager # [phone]

## 2017-02-22 LAB — CBC WITH DIFFERENTIAL/PLATELET
Basophils Absolute: 0 10*3/uL (ref 0.0–0.1)
Basophils Relative: 0 %
Eosinophils Absolute: 0.1 10*3/uL (ref 0.0–0.7)
Eosinophils Relative: 3 %
HEMATOCRIT: 26.8 % — AB (ref 39.0–52.0)
HEMOGLOBIN: 9.5 g/dL — AB (ref 13.0–17.0)
LYMPHS ABS: 1.3 10*3/uL (ref 0.7–4.0)
LYMPHS PCT: 24 %
MCH: 32.6 pg (ref 26.0–34.0)
MCHC: 35.4 g/dL (ref 30.0–36.0)
MCV: 92.1 fL (ref 78.0–100.0)
Monocytes Absolute: 0.7 10*3/uL (ref 0.1–1.0)
Monocytes Relative: 13 %
NEUTROS ABS: 3.3 10*3/uL (ref 1.7–7.7)
NEUTROS PCT: 60 %
Platelets: 292 10*3/uL (ref 150–400)
RBC: 2.91 MIL/uL — ABNORMAL LOW (ref 4.22–5.81)
RDW: 13.3 % (ref 11.5–15.5)
WBC: 5.4 10*3/uL (ref 4.0–10.5)

## 2017-02-22 LAB — BASIC METABOLIC PANEL
ANION GAP: 10 (ref 5–15)
BUN: 17 mg/dL (ref 6–20)
CHLORIDE: 94 mmol/L — AB (ref 101–111)
CO2: 22 mmol/L (ref 22–32)
Calcium: 8.8 mg/dL — ABNORMAL LOW (ref 8.9–10.3)
Creatinine, Ser: 1.18 mg/dL (ref 0.61–1.24)
GFR calc Af Amer: >60 mL/min (ref >60)
GFR calc non Af Amer: 53 mL/min — ABNORMAL LOW (ref >60)
GLUCOSE: 108 mg/dL — AB (ref 65–99)
POTASSIUM: 4.5 mmol/L (ref 3.5–5.1)
Sodium: 126 mmol/L — ABNORMAL LOW (ref 135–145)

## 2017-02-22 LAB — MAGNESIUM: Magnesium: 1.8 mg/dL (ref 1.7–2.4)

## 2017-02-22 LAB — GLUCOSE, CAPILLARY
GLUCOSE-CAPILLARY: 122 mg/dL — AB (ref 65–99)
GLUCOSE-CAPILLARY: 142 mg/dL — AB (ref 65–99)
Glucose-Capillary: 111 mg/dL — ABNORMAL HIGH (ref 65–99)
Glucose-Capillary: 111 mg/dL — ABNORMAL HIGH (ref 65–99)

## 2017-02-22 LAB — LACTIC ACID, PLASMA: Lactic Acid, Venous: 0.6 mmol/L (ref 0.5–1.9)

## 2017-02-22 MED ORDER — LACTULOSE 10 GM/15ML PO SOLN
10.0000 g | Freq: Two times a day (BID) | ORAL | Status: AC
  Administered 2017-02-22 (×2): 10 g via ORAL
  Filled 2017-02-22 (×2): qty 15

## 2017-02-22 MED ORDER — IPRATROPIUM-ALBUTEROL 0.5-2.5 (3) MG/3ML IN SOLN: 3.0000 mL | Freq: Four times a day (QID) | RESPIRATORY_TRACT | Status: DC | PRN

## 2017-02-22 MED ORDER — AZITHROMYCIN 250 MG PO TABS
500.0000 mg | ORAL_TABLET | Freq: Every day | ORAL | Status: DC
  Administered 2017-02-22: 500 mg via ORAL
  Filled 2017-02-22: qty 2

## 2017-02-22 MED ORDER — MAGNESIUM HYDROXIDE 400 MG/5ML PO SUSP
30.0000 mL | Freq: Once | ORAL | Status: AC
  Administered 2017-02-22: 30 mL via ORAL
  Filled 2017-02-22: qty 30

## 2017-02-22 MED ORDER — SORBITOL 70 % SOLN
960.0000 mL | TOPICAL_OIL | Freq: Once | ORAL | Status: DC
  Filled 2017-02-22: qty 473

## 2017-02-22 MED ORDER — ZOLPIDEM TARTRATE 5 MG PO TABS: 5.0000 mg | ORAL_TABLET | Freq: Every evening | ORAL | Status: DC | PRN

## 2017-02-22 MED ORDER — MINERAL OIL RE ENEM
1.0000 | ENEMA | Freq: Once | RECTAL | Status: AC
  Administered 2017-02-22: 1 via RECTAL
  Filled 2017-02-22: qty 1

## 2017-02-22 MED ORDER — AMOXICILLIN-POT CLAVULANATE 500-125 MG PO TABS
1.0000 | ORAL_TABLET | Freq: Two times a day (BID) | ORAL | Status: DC
  Administered 2017-02-23 – 2017-02-24 (×3): 500 mg via ORAL
  Filled 2017-02-22 (×3): qty 1

## 2017-02-22 NOTE — Progress Notes (Signed)
PHARMACIST - PHYSICIAN COMMUNICATION 3 CONCERNING: Antibiotic IV to Oral Route Change Policy  RECOMMENDATION: This patient is receiving azithromycin by the intravenous route.  Based on criteria approved by the Pharmacy and Therapeutics Committee, the antibiotic(s) is/are being converted to the equivalent oral dose form(s).   DESCRIPTION: These criteria include:  Patient being treated for a respiratory tract infection, urinary tract infection, cellulitis or clostridium difficile associated diarrhea if on metronidazole  The patient is not neutropenic and does not exhibit a GI malabsorption state  The patient is eating (either orally or via tube) and/or has been taking other orally administered medications for a least 24 hours  The patient is improving clinically and has a Tmax < 100.5  If you have questions about this conversion, please contact the Pharmacy Department  []   [phone] )  [facility] []   [phone] )  [facility] []   [phone] )  [facility] []   [phone] )  [facility] [x]   [phone] )  [place], [place].D. [phone] 02/22/2017 8:10 AM

## 2017-02-22 NOTE — Progress Notes (Signed)
Pt. Refused SMOG enema this evening. Pt. Educated and questions answered, requesting to take SMOG enema tomorrow 2/23 if no BM results over night. Will continue to monitor and make oncoming RN aware.

## 2017-02-22 NOTE — Care Management Important Message (Addendum)
Important Message  Patient Details IM Letter given to Kathy/Case Manager to present to the Patient Name: [name] MRN: [id number] Date of Birth: 02-06-1925   Medicare Important Message Given:  Yes    Kerin Salen 02/22/2017, 12:17 [place] Message  Patient Details  Name: [name] MRN: [id number] Date of Birth: 10-26-1925   Medicare Important Message Given:  Yes    Kerin Salen 02/22/2017, 12:17 PM

## 2017-02-22 NOTE — Progress Notes (Signed)
PROGRESS NOTE  [name] RSW:[id number] DOB: 29-Dec-1925 DOA: 02/19/2017 PCP: [name], MD  Brief summary: patient reports started to have flu symptom early this month , he was tested + for flu A on 2/5, he could not remember he took tamiflu or not. was given azithromycin. He continued to have upper respiratory symptom, he was seen in the ED on 2/11 was given doxycycline. he continued to have symptom, he presented to the ED on 2/19 found to have " Patchy peripheral airspace opacities in both lungs, left greater than right concerning for early pneumonia on CT chest  and severe hyponatremia. He is tested positive for influenza A again.  HPI/Recap of past 24 hours:  Feeling better, less cough, no room air, denies pain, no fever Still constipated, denies abdominal pain, no nausea, no vomiting. Family at bedside  Assessment/Plan: Active Problems:   Hyponatremia  Generalized weakness, Community-acquired pneumonia, failed outpatient treatment/+ fluA -ct chest on presentation "Patchy peripheral airspace opacities in both lungs, left greater than right concerning for early pneumonia." -- blood cultures no growth, UA unremarkable, urine strep peneumo antigen negative, urine legionella antigen negative.  sputum not collected, cough is nonproductive.  -he was started on  Rocephin and azithromycin, improving, antibiotic changed to Augmentin. -  tamiflux5days  Severe Hyponatremia; suspect hypovolemia  -sodium 112 on presentation.  Likely secondary to dehydration from poor p.o. intake over 2 weeks - improving on ivf, continue, repeat lab in am  Hypomagnesemia: remain continue to replace mag  H/o COPD: no wheezing, no hypoxia. Not on meds at home. Quit smoking a long time ago.   7 mm pulmonary nodule -Quit smoking at least 50 years ago.  He will need a repeat scan in about 12 months without contrast to further evaluate and monitor this -status post left lower lobectomy for  benign tumor in 1980  AKI on CKD stage III -bun 22/Creatinine is 1.49 on presentation, improving on hydration, bun/cr today on 2/22 is 17/1.18 -has chronic metabolic acidosis since 0938 per chart review, this is likely from ckd, he is started sodium bicarb.  Coronary artery disease, Mitral valve repair - This appears to be stable -We will continue his home aspirin, statin, Cardizem and lisinopril  GERD -Continue omeprazole 20 mg  History of gout -On allopurinol   constipation: stool softener   DVT prophylaxis: Hep SQ Code Status: DNR Family Communication:  daughter and Wife at beside  Disposition Plan:  home in 1-2 days once sodium improved Consults called: case discussed with infectious disease over the phone on 2/21   Procedures:  none  Antibiotics:  Rocephin/zithro since admission to 2/22  Augmentin from 2/23   Objective: BP (!) 155/58 (BP Location: Right Arm)   Pulse 63   Temp (!) 97.5 F (36.4 C) (Oral)   Resp 18   Ht 6' (1.829 m)   Wt 74.9 kg (165 lb 2 oz)   SpO2 97%   BMI 22.39 kg/m   Intake/Output Summary (Last 24 hours) at 02/22/2017 0803 Last data filed at 02/22/2017 1829 Gross per 24 hour  Intake 2790 ml  Output 3575 ml  Net -785 ml   Filed Weights   02/19/17 1728 02/21/17 0507 02/22/17 0428  Weight: 74.3 kg (163 lb 12.8 oz) 75.4 kg (166 lb 3.6 oz) 74.9 kg (165 lb 2 oz)    Exam: Patient is examined daily including today on 02/22/2017, exams remain the same as of yesterday except that has changed    General:  NAD  Cardiovascular: RRR  Respiratory: CTABL, well-healed surgical scar from status post left lower lobectomy   Abdomen: Soft/ND/NT, positive BS  Musculoskeletal: No Edema  Neuro: alert, oriented   Data Reviewed: Basic Metabolic Panel: Recent Labs  Lab 02/19/17 0842 02/19/17 1845 02/19/17 2339 02/20/17 0537 02/21/17 0515 02/22/17 0503  NA 114* 117* 119* 121* 122* 126*  K 4.5 4.6  --  4.4 4.3 4.5  CL 83* 86*   --  90* 92* 94*  CO2 19* 21*  --  21* 21* 22  GLUCOSE 111* 177*  --  99 120* 108*  BUN 24* 22*  --  20 21* 17  CREATININE 1.40*  1.37* 1.42*  --  1.34* 1.35* 1.18  CALCIUM 8.6* 8.7*  --  8.7* 8.6* 8.8*  MG  --   --   --   --  1.4* 1.8   Liver Function Tests: Recent Labs  Lab 02/19/17 0625 02/20/17 0537  AST 38 34  ALT 14* 13*  ALKPHOS 107 91  BILITOT 0.7 0.6  PROT 6.9 6.1*  ALBUMIN 3.4* 3.0*   No results for input(s): LIPASE, AMYLASE in the last 168 hours. No results for input(s): AMMONIA in the last 168 hours. CBC: Recent Labs  Lab 02/19/17 0625 02/19/17 0842 02/20/17 0537 02/22/17 0503  WBC 8.0 7.3 5.9 5.4  NEUTROABS 5.5  --   --  3.3  HGB 10.7* 10.3* 9.4* 9.5*  HCT 29.2* 28.4* 26.2* 26.8*  MCV 88.2 88.5 89.4 92.1  PLT 329 299 268 292   Cardiac Enzymes:   No results for input(s): CKTOTAL, CKMB, CKMBINDEX, TROPONINI in the last 168 hours. BNP (last 3 results) No results for input(s): BNP in the last 8760 hours.  ProBNP (last 3 results) No results for input(s): PROBNP in the last 8760 hours.  CBG: Recent Labs  Lab 02/21/17 0743 02/21/17 1141 02/21/17 1630 02/21/17 2052 02/22/17 0757  GLUCAP 113* 145* 126* 146* 122*    Recent Results (from the past 240 hour(s))  Culture, blood (Routine X 2) w Reflex to ID Panel     Status: None (Preliminary result)   Collection Time: 02/19/17  8:50 AM  Result Value Ref Range Status   Specimen Description   Final    BLOOD RIGHT ANTECUBITAL Performed at [facility], [place] [address] [place], [place] [address]    Special Requests   Final    BOTTLES DRAWN AEROBIC AND ANAEROBIC Blood Culture adequate volume Performed at [facility] [address] [place], [place] [address]    Culture   Final    NO GROWTH 2 DAYS Performed at [facility] [address], [place], [place] [address]    Report Status PENDING  Incomplete  Culture, blood (Routine X 2) w Reflex to ID Panel      Status: None (Preliminary result)   Collection Time: 02/19/17  6:45 PM  Result Value Ref Range Status   Specimen Description   Final    BLOOD LEFT ANTECUBITAL Performed at [facility] [address] [place], [place] [address]    Special Requests   Final    BOTTLES DRAWN AEROBIC ONLY Blood Culture adequate volume Performed at [facility] [address], [place], [place] [address]    Culture   Final    NO GROWTH 2 DAYS Performed at [facility] [address], [place], [place] [address]    Report Status PENDING  Incomplete  MRSA PCR Screening  Status: None   Collection Time: 02/21/17  4:05 PM  Result Value Ref Range Status   MRSA by PCR NEGATIVE NEGATIVE Final    Comment:        The GeneXpert MRSA Assay (FDA approved for NASAL specimens only), is one component of a comprehensive MRSA colonization surveillance program. It is not intended to diagnose MRSA infection nor to guide or monitor treatment for MRSA infections. Performed at [facility], [place] [address], [place], [place] [address]      Studies: No results found.  Scheduled Meds: . allopurinol  150 mg Oral Daily  . aspirin EC  81 mg Oral Daily  . atorvastatin  10 mg Oral q1800  . bisacodyl  10 mg Rectal Daily  . diltiazem  300 mg Oral Daily  . feeding supplement (ENSURE ENLIVE)  237 mL Oral BID BM  . heparin  5,000 Units Subcutaneous Q8H  . insulin aspart  0-5 Units Subcutaneous QHS  . insulin aspart  0-9 Units Subcutaneous TID WC  . ipratropium-albuterol  3 mL Nebulization TID  . lisinopril  40 mg Oral Daily  . multivitamin with minerals  1 tablet Oral Daily  . omeprazole  20 mg Oral QODAY  . oseltamivir  30 mg Oral BID  . polyethylene glycol  17 g Oral BID  . senna-docusate  2 tablet Oral BID  . sodium bicarbonate  650 mg Oral TID    Continuous Infusions: . sodium chloride 75 mL/hr at 02/22/17 0643  . azithromycin Stopped (02/21/17  5366)  . cefTRIAXone (ROCEPHIN)  IV Stopped (02/21/17 1108)     Time spent: 35 mins I have personally reviewed and interpreted on  02/22/2017 daily labs, tele strips, imagings as discussed above under date review session and assessment and plans.  I reviewed all nursing notes, pharmacy notes, vitals, pertinent old records  I have discussed plan of care as described above with RN , patient and family on 02/22/2017   Florencia Reasons MD, PhD  [facility] Pager [phone]. If 7PM-7AM, please contact night-coverage at [email or web address], password [facility] 02/22/2017, 8:03 AM  LOS: 3 days

## 2017-02-23 LAB — BASIC METABOLIC PANEL
Anion gap: 8 (ref 5–15)
BUN: 15 mg/dL (ref 6–20)
CALCIUM: 8.8 mg/dL — AB (ref 8.9–10.3)
CO2: 24 mmol/L (ref 22–32)
CREATININE: 1.24 mg/dL (ref 0.61–1.24)
Chloride: 95 mmol/L — ABNORMAL LOW (ref 101–111)
GFR calc Af Amer: 58 mL/min — ABNORMAL LOW (ref >60)
GFR, EST NON AFRICAN AMERICAN: 50 mL/min — AB (ref >60)
Glucose, Bld: 103 mg/dL — ABNORMAL HIGH (ref 65–99)
Potassium: 4.7 mmol/L (ref 3.5–5.1)
SODIUM: 127 mmol/L — AB (ref 135–145)

## 2017-02-23 LAB — GLUCOSE, CAPILLARY
Glucose-Capillary: 105 mg/dL — ABNORMAL HIGH (ref 65–99)
Glucose-Capillary: 132 mg/dL — ABNORMAL HIGH (ref 65–99)
Glucose-Capillary: 113 mg/dL — ABNORMAL HIGH (ref 65–99)

## 2017-02-23 LAB — MAGNESIUM: MAGNESIUM: 1.7 mg/dL (ref 1.7–2.4)

## 2017-02-23 MED ORDER — SENNOSIDES-DOCUSATE SODIUM 8.6-50 MG PO TABS
1.0000 | ORAL_TABLET | Freq: Every day | ORAL | Status: DC
  Filled 2017-02-23: qty 1

## 2017-02-23 NOTE — Progress Notes (Signed)
PROGRESS NOTE  HURSCHEL PAYNTER PJK:[id number] DOB: [birth year] DOA: 02/19/2017 PCP: [name], MD  Brief summary: patient reports started to have flu symptom early this month , he was tested + for flu A on 2/5, he could not remember he took tamiflu or not. was given azithromycin. He continued to have upper respiratory symptom, he was seen in the ED on 2/11 was given doxycycline. he continued to have symptom, he presented to the ED on 2/19 found to have " Patchy peripheral airspace opacities in both lungs, left greater than right concerning for early pneumonia on CT chest  and severe hyponatremia. He is tested positive for influenza A again.  HPI/Recap of past 24 hours:  Feeling better, less cough, no room air, denies pain, no fever Had bm after enema,  denies abdominal pain, no nausea, no vomiting. Family at bedside  Assessment/Plan: Active Problems:   Hyponatremia  Generalized weakness, Community-acquired pneumonia, failed outpatient treatment/+ fluA -ct chest on presentation "Patchy peripheral airspace opacities in both lungs, left greater than right concerning for early pneumonia." -- blood cultures no growth, UA unremarkable, urine strep peneumo antigen negative, urine legionella antigen negative.  sputum not collected, cough is nonproductive.  -he was started on  Rocephin and azithromycin, improving, antibiotic changed to Augmentin. Plan for total of 7 days abx treatment , today 5/7. -  tamiflux5days  Severe Hyponatremia; suspect hypovolemia  -sodium 112 on presentation.  Likely secondary to dehydration from poor p.o. intake over 2 weeks - improving, stop ivf today, he started to have some mild lower extremity edema,  encourage oral intake Repeat lab in am.  Hypomagnesemia: s/p replacement,  Mag 1.7.  H/o COPD: no wheezing, no hypoxia. Not on meds at home. Quit smoking a long time ago.   7 mm pulmonary nodule -Quit smoking at least 50 years ago.  He will need a  repeat scan in about 12 months without contrast to further evaluate and monitor this -status post left lower lobectomy for benign tumor in 1980  AKI on CKD stage III -bun 22/Creatinine is 1.49 on presentation, improving on hydration, bun/cr today on 2/22 is 17/1.18 -has chronic metabolic acidosis since 8099 per chart review, this is likely from ckd, he is started sodium bicarb.  Coronary artery disease, Mitral valve repair - This appears to be stable -We will continue his home aspirin, statin, Cardizem and lisinopril  GERD -Continue omeprazole 20 mg  History of gout -On allopurinol   constipation: stool softener   DVT prophylaxis: Hep SQ Code Status: DNR Family Communication:  daughter and Wife at beside  Disposition Plan:  home in 1-2 days once sodium improved Consults called: case discussed with infectious disease over the phone on 2/21   Procedures:  none  Antibiotics:  Rocephin/zithro since admission to 2/22  Augmentin from 2/23   Objective: BP (!) 161/55 (BP Location: Right Arm)   Pulse 70   Temp 98.1 F (36.7 C) (Oral)   Resp 16   Ht 6' (1.829 m)   Wt 76.5 kg (168 lb 11.2 oz)   SpO2 98%   BMI 22.88 kg/m   Intake/Output Summary (Last 24 hours) at 02/23/2017 0938 Last data filed at 02/23/2017 0701 Gross per 24 hour  Intake 1800 ml  Output 2060 ml  Net -260 ml   Filed Weights   02/21/17 0507 02/22/17 0428 02/23/17 0504  Weight: 75.4 kg (166 lb 3.6 oz) 74.9 kg (165 lb 2 oz) 76.5 kg (168 lb 11.2 oz)  Exam: Patient is examined daily including today on 02/23/2017, exams remain the same as of yesterday except that has changed    General:  NAD  Cardiovascular: RRR  Respiratory: CTABL, well-healed surgical scar from status post left lower lobectomy   Abdomen: Soft/ND/NT, positive BS  Musculoskeletal: mild pitting Edema lower extremities  Neuro: alert, oriented   Data Reviewed: Basic Metabolic Panel: Recent Labs  Lab 02/19/17 1845  02/19/17 2339 02/20/17 0537 02/21/17 0515 02/22/17 0503 02/23/17 0538  NA 117* 119* 121* 122* 126* 127*  K 4.6  --  4.4 4.3 4.5 4.7  CL 86*  --  90* 92* 94* 95*  CO2 21*  --  21* 21* 22 24  GLUCOSE 177*  --  99 120* 108* 103*  BUN 22*  --  20 21* 17 15  CREATININE 1.42*  --  1.34* 1.35* 1.18 1.24  CALCIUM 8.7*  --  8.7* 8.6* 8.8* 8.8*  MG  --   --   --  1.4* 1.8 1.7   Liver Function Tests: Recent Labs  Lab 02/19/17 0625 02/20/17 0537  AST 38 34  ALT 14* 13*  ALKPHOS 107 91  BILITOT 0.7 0.6  PROT 6.9 6.1*  ALBUMIN 3.4* 3.0*   No results for input(s): LIPASE, AMYLASE in the last 168 hours. No results for input(s): AMMONIA in the last 168 hours. CBC: Recent Labs  Lab 02/19/17 0625 02/19/17 0842 02/20/17 0537 02/22/17 0503  WBC 8.0 7.3 5.9 5.4  NEUTROABS 5.5  --   --  3.3  HGB 10.7* 10.3* 9.4* 9.5*  HCT 29.2* 28.4* 26.2* 26.8*  MCV 88.2 88.5 89.4 92.1  PLT 329 299 268 292   Cardiac Enzymes:   No results for input(s): CKTOTAL, CKMB, CKMBINDEX, TROPONINI in the last 168 hours. BNP (last 3 results) No results for input(s): BNP in the last 8760 hours.  ProBNP (last 3 results) No results for input(s): PROBNP in the last 8760 hours.  CBG: Recent Labs  Lab 02/22/17 0757 02/22/17 1154 02/22/17 1633 02/22/17 2120 02/23/17 0758  GLUCAP 122* 111* 142* 111* 105*    Recent Results (from the past 240 hour(s))  Culture, blood (Routine X 2) w Reflex to ID Panel     Status: None (Preliminary result)   Collection Time: 02/19/17  8:50 AM  Result Value Ref Range Status   Specimen Description   Final    BLOOD RIGHT ANTECUBITAL Performed at [facility], [place] [address] [place], [place] [address]    Special Requests   Final    BOTTLES DRAWN AEROBIC AND ANAEROBIC Blood Culture adequate volume Performed at [facility] [address], [place], [place] [address]    Culture   Final    NO GROWTH 3 DAYS Performed at [facility], [place] [address], [place], [place] [address]    Report Status PENDING  Incomplete  Culture, blood (Routine X 2) w Reflex to ID Panel     Status: None (Preliminary result)   Collection Time: 02/19/17  6:45 PM  Result Value Ref Range Status   Specimen Description   Final    BLOOD LEFT ANTECUBITAL Performed at [facility] [address] [place], [place] [address]    Special Requests   Final    BOTTLES DRAWN AEROBIC ONLY Blood Culture adequate volume Performed at [facility] [address], [place], [place] [address]    Culture   Final    NO GROWTH 3  DAYS Performed at [facility], [place] [address], [place], [place] [address]    Report Status PENDING  Incomplete  MRSA PCR Screening     Status: None   Collection Time: 02/21/17  4:05 PM  Result Value Ref Range Status   MRSA by PCR NEGATIVE NEGATIVE Final    Comment:        The GeneXpert MRSA Assay (FDA approved for NASAL specimens only), is one component of a comprehensive MRSA colonization surveillance program. It is not intended to diagnose MRSA infection nor to guide or monitor treatment for MRSA infections. Performed at [facility], [place] [address], [place],  [address]      Studies: No results found.  Scheduled Meds: . allopurinol  150 mg Oral Daily  . amoxicillin-clavulanate  1 tablet Oral BID  . aspirin EC  81 mg Oral Daily  . atorvastatin  10 mg Oral q1800  . diltiazem  300 mg Oral Daily  . feeding supplement (ENSURE ENLIVE)  237 mL Oral BID BM  . heparin  5,000 Units Subcutaneous Q8H  . insulin aspart  0-5 Units Subcutaneous QHS  . insulin aspart  0-9 Units Subcutaneous TID WC  . lisinopril  40 mg Oral Daily  . multivitamin with minerals  1 tablet Oral Daily  . omeprazole  20 mg Oral QODAY  . oseltamivir  30 mg Oral BID  . polyethylene glycol  17 g Oral BID  . senna-docusate  2 tablet Oral BID  . sodium bicarbonate  650 mg Oral  TID  . sorbitol, milk of mag, mineral oil, glycerin (SMOG) enema  960 mL Rectal Once    Continuous Infusions: . sodium chloride 75 mL/hr at 02/23/17 0503     Time spent: 25 mins I have personally reviewed and interpreted on  02/23/2017 daily labs, tele strips, imagings as discussed above under date review session and assessment and plans.  I reviewed all nursing notes, pharmacy notes, vitals, pertinent old records  I have discussed plan of care as described above with RN , patient and family on 02/23/2017   Florencia Reasons MD, PhD  [facility] Pager [phone]. If 7PM-7AM, please contact night-coverage at [email or web address], password [facility] 02/23/2017, 9:38 AM  LOS: 4 days

## 2017-02-23 NOTE — Progress Notes (Signed)
Pt. Successfully had a BM. BM was loose, but formed at first and was mushy consistency. Dark brown color, no black tarry appearance. Pt. Was concerned for any black stools, had RN inspect BM. Pt. educated and answered all questions. Will make oncoming nurse aware and will continue to monitor.

## 2017-02-24 DIAGNOSIS — J181 Lobar pneumonia, unspecified organism: Secondary | ICD-10-CM

## 2017-02-24 LAB — CULTURE, BLOOD (ROUTINE X 2)
Culture: NO GROWTH
Culture: NO GROWTH
Special Requests: ADEQUATE
Special Requests: ADEQUATE

## 2017-02-24 LAB — BASIC METABOLIC PANEL
Anion gap: 9 (ref 5–15)
BUN: 18 mg/dL (ref 6–20)
CALCIUM: 8.9 mg/dL (ref 8.9–10.3)
CO2: 24 mmol/L (ref 22–32)
CREATININE: 1.35 mg/dL — AB (ref 0.61–1.24)
Chloride: 94 mmol/L — ABNORMAL LOW (ref 101–111)
GFR, EST AFRICAN AMERICAN: 52 mL/min — AB (ref >60)
GFR, EST NON AFRICAN AMERICAN: 45 mL/min — AB (ref >60)
Glucose, Bld: 103 mg/dL — ABNORMAL HIGH (ref 65–99)
Potassium: 4.8 mmol/L (ref 3.5–5.1)
SODIUM: 127 mmol/L — AB (ref 135–145)

## 2017-02-24 LAB — URIC ACID: URIC ACID, SERUM: 3.6 mg/dL — AB (ref 4.4–7.6)

## 2017-02-24 LAB — GLUCOSE, CAPILLARY: GLUCOSE-CAPILLARY: 98 mg/dL (ref 65–99)

## 2017-02-24 LAB — MAGNESIUM: Magnesium: 1.6 mg/dL — ABNORMAL LOW (ref 1.7–2.4)

## 2017-02-24 MED ORDER — AMOXICILLIN-POT CLAVULANATE 500-125 MG PO TABS: 1.0000 | ORAL_TABLET | Freq: Two times a day (BID) | ORAL | 0 refills | Status: AC

## 2017-02-24 MED ORDER — SENNOSIDES-DOCUSATE SODIUM 8.6-50 MG PO TABS: 1.0000 | ORAL_TABLET | Freq: Every day | ORAL | 0 refills | Status: DC

## 2017-02-24 MED ORDER — SODIUM BICARBONATE 650 MG PO TABS: 650.0000 mg | ORAL_TABLET | Freq: Every day | ORAL | 0 refills | Status: AC

## 2017-02-24 MED ORDER — OSELTAMIVIR PHOSPHATE 30 MG PO CAPS: 30.0000 mg | ORAL_CAPSULE | Freq: Two times a day (BID) | ORAL | 0 refills | Status: AC

## 2017-02-24 NOTE — Discharge Summary (Signed)
Discharge Summary  [name]:[id number] DOB: 08-Mar-1925  PCP: [name], MD  Admit date: 02/19/2017 Discharge date: 02/24/2017  Time spent: <59mins  Recommendations for Outpatient Follow-up:  1. F/u with PMD within a week  for hospital discharge follow up, repeat cbc/bmp at follow up  Discharge Diagnoses:  Active Hospital Problems   Diagnosis Date Noted  . Hyponatremia 02/19/2017    Resolved Hospital Problems  No resolved problems to display.    Discharge Condition: stable  Diet recommendation: regular diet  Filed Weights   02/22/17 0428 02/23/17 0504 02/24/17 0500  Weight: 74.9 kg (165 lb 2 oz) 76.5 kg (168 lb 11.2 oz) 73.9 kg (162 lb 14.7 oz)    History of present illness:  PCP: [name], MD Patient coming from: Home  Chief Complaint: Fatigue  HPI: [name] is a 84 y.o. male with medical history significant of mitral valve repair hyperlipidemia, hypertension, CAD, CKD stage III came to the hospital with complains of generalized weakness and fatigue.  Patient states he was diagnosed of possible bacterial bronchitis about 2 weeks ago and was placed on 5 days of azithromycin which did not help him.  He returned back to his PCP and thinks was diagnosed with flu and was provided supportive care.  Even this did not help him much therefore he came to the ER last week and was discharged on 5 days of 50 mg twice daily doxycycline.  He was also noted to be slightly hyponatremic at 124 for which she was asked to increase his hydration and eat salty food.  He tried doing this at home but his appetite remains very poor.  Despite of these things he continued to feel very fatigued, generalized weakness with some productive cough bringing up greenish brown sputum.  Denies any fevers, chills. He quit smoking about 50 years ago.  Denies any alcohol and drug use  In the ER patient appeared generally weak with some signs of dehydration.  His labs showed  sodium of 112, chloride 80, creatinine 1.43.  CT of the chest showed 7 mm nodule in the right upper lobe, early pneumonia-patchy peripheral space opacity.  Due to failure of outpatient treatment with oral antibiotics it was determined to admit him for further inpatient care.    Hospital Course:  Active Problems:   Hyponatremia   Generalized weakness, Community-acquired pneumonia, failed outpatient treatment/+ fluA -ct chest on presentation "Patchy peripheral airspace opacities in both lungs, left greater than right concerning for early pneumonia." -- blood cultures no growth, UA unremarkable, urine strep peneumo antigen negative, urine legionella antigen negative.  sputum not collected, cough is nonproductive, now cough resolved -he was started on Rocephin and azithromycin, improving, antibiotic changed to Augmentin. Plan for total of 7 days abx treatment -  tamiflux5days -much improved, lung clear at discharge. No cough, no fever, no leukocytosis. -he ambulated with PT, no recommendation for PT follow up.  Severe Hyponatremia; suspect hypovolemia  -sodium 112 on presentation.  Likely secondary to dehydration from poor p.o. intake over 2 weeks - improving, stop ivf on 2/23 due to some mild lower extremity edema,  encourage oral intake - edema resolved on 2/24, sodium remains at 127 , patient is asymptomatic. He is instructed to avoid free water, he is discharged home with close pmd follow up.  Hypomagnesemia: s/p replacement,  Mag 1.7.  H/o COPD: no wheezing, no hypoxia. Not on meds at home. Quit smoking a long time ago.   7 mm pulmonary nodule -Quit  smoking at least 50 years ago. He will need a repeat scan in about 12 months without contrast to further evaluate and monitor this -status post left lower lobectomy for benign tumor in 1980  AKI on CKD stage III -bun 22/Creatinine is 1.49 on presentation, improving on hydration, bun/cr today on 2/22 is 17/1.18 -has chronic  metabolic acidosis since 2119 per chart review, this is likely from ckd, he is started sodium bicarb.  Coronary artery disease, Mitral valve repair - This appears to be stable -continue his home aspirin, statin, Cardizem and lisinopril  GERD -Continue omeprazole 20 mg  History of gout -On allopurinol   constipation: stool softener   DVT prophylaxis:Hep SQ Code Status:DNR Family Communication:daughter and Wife at beside Disposition Plan:home  Consults called:case discussed with infectious disease over the phone on 2/21   Procedures:  none  Antibiotics:  Rocephin/zithro since admission to 2/22  Augmentin from 2/23   Discharge Exam: BP (!) 158/61 (BP Location: Right Arm)   Pulse 65   Temp 97.9 F (36.6 C) (Oral)   Resp 16   Ht 6' (1.829 m)   Wt 73.9 kg (162 lb 14.7 oz)   SpO2 98%   BMI 22.10 kg/m    General:  NAD  Cardiovascular: RRR  Respiratory: CTABL, well-healed surgical scar from status post left lower lobectomy   Abdomen: Soft/ND/NT, positive BS  Musculoskeletal: mild pitting Edema lower extremities observed on 2/23 has resolved.  Neuro: alert, oriented    Discharge Instructions You were cared for by a hospitalist during your hospital stay. If you have any questions about your discharge medications or the care you received while you were in the hospital after you are discharged, you can call the unit and asked to speak with the hospitalist on call if the hospitalist that took care of you is not available. Once you are discharged, your primary care physician will handle any further medical issues. Please note that NO REFILLS for any discharge medications will be authorized once you are discharged, as it is imperative that you return to your primary care physician (or establish a relationship with a primary care physician if you do not have one) for your aftercare needs so that they can reassess your need for medications and monitor  your lab values.  Discharge Instructions    Diet general   Complete by:  As directed    Increase activity slowly   Complete by:  As directed      Allergies as of 02/24/2017   No Known Allergies     Medication List    TAKE these medications   acetaminophen 325 MG tablet Commonly known as:  TYLENOL Take 650 mg by mouth every 6 (six) hours as needed for mild pain or headache.   allopurinol 300 MG tablet Commonly known as:  ZYLOPRIM Take 150 mg by mouth daily.   amoxicillin-clavulanate 500-125 MG tablet Commonly known as:  AUGMENTIN Take 1 tablet (500 mg total) by mouth 2 (two) times daily for 2 days.   aspirin EC 81 MG tablet Take 81 mg by mouth daily.   atorvastatin 10 MG tablet Commonly known as:  LIPITOR Take 1 tablet (10 mg total) by mouth daily at 6 PM.   diltiazem 300 MG 24 hr capsule Commonly known as:  CARDIZEM CD Take 300 mg by mouth daily.   lisinopril 40 MG tablet Commonly known as:  PRINIVIL,ZESTRIL Take 40 mg by mouth daily.   omeprazole 20 MG tablet Commonly known as:  PRILOSEC  OTC Take 20 mg by mouth every other day.   ondansetron 4 MG tablet Commonly known as:  ZOFRAN Take 1 tablet (4 mg total) by mouth daily as needed for nausea or vomiting.   oseltamivir 30 MG capsule Commonly known as:  TAMIFLU Take 1 capsule (30 mg total) by mouth 2 (two) times daily for 1 day.   senna-docusate 8.6-50 MG tablet Commonly known as:  Senokot-S Take 1 tablet by mouth at bedtime.   sodium bicarbonate 650 MG tablet Take 1 tablet (650 mg total) by mouth daily for 7 days.      No Known Allergies Follow-up Information    [name], MD Follow up in 1 week(s).   Specialty:  Internal Medicine Why:  hospital discharge follow up, repeat cbc/bmp at follow up. Contact information: [address] [place] [address] [phone]            The results of significant diagnostics from this hospitalization (including imaging,  microbiology, ancillary and laboratory) are listed below for reference.    Significant Diagnostic Studies: Dg Chest 2 View  Result Date: 02/11/2017 CLINICAL DATA:  Cough and flu-like symptoms 10 days. EXAM: CHEST  2 VIEW COMPARISON:  10/20/2015 and 01/19/2014 as well as CT 01/18/2014 FINDINGS: Lungs are adequately inflated with postsurgical change over the left hilar region. Mild stable chronic changes over the left lung. Slight increased density over the left midlung as cannot completely exclude atelectasis or early infectious process. No evidence of effusion. Cardiomediastinal silhouette and remainder of the exam is unchanged. IMPRESSION: Postsurgical and chronic change of the left lung and hilar region. Slight increased density over the left midlung as this may be due to atelectasis or early infection. Electronically Signed   By: Marin Olp M.D.   On: 02/11/2017 10:23   Dg Abd 1 View  Result Date: 02/20/2017 CLINICAL DATA:  Right-sided abdominal pain and distention. EXAM: ABDOMEN - 1 VIEW COMPARISON:  None. FINDINGS: Significant amount of fecal retention is seen along the right colon from cecum through proximal transverse colon. Gaseous distention noted from mid to distal transverse through proximal descending colon with lesser degree of stool burden along the mid to distal descending and proximal sigmoid colon. No significant small bowel dilatation. Calcification in the right hemipelvis more likely to represent a small phlebolith. Additional phlebolith versus diverticulum projects over the left iliac bone. There is lumbar spondylosis with levoscoliosis from L2 through S1. IMPRESSION: 1. Findings consistent with constipation. 2. Levoscoliosis of the lumbar spine with multilevel degenerative disc and facet arthropathy consistent spondylosis. Electronically Signed   By: Ashley Royalty M.D.   On: 02/20/2017 16:56   Ct Chest Wo Contrast  Result Date: 02/19/2017 CLINICAL DATA:  Persistent cough EXAM: CT  CHEST WITHOUT CONTRAST TECHNIQUE: Multidetector CT imaging of the chest was performed following the standard protocol without IV contrast. COMPARISON:  Chest x-ray 02/11/2017.  Chest CT 01/19/2014. FINDINGS: Cardiovascular: Heart is enlarged. Moderate coronary artery and aortic calcifications. No evidence of aortic aneurysm. Mediastinum/Nodes: Scattered small mediastinal lymph nodes are stable or improved since 2016. No axillary or hilar adenopathy. Small hiatal hernia. Lungs/Pleura: Biapical scarring patchy airspace opacities in both lungs involving the left upper lobe and left lower lobe and right lower lobe concerning for pneumonia. 6 mm nodule in the right upper lobe on image 51 is new since 2016. No effusions. Upper Abdomen: Imaging into the upper abdomen shows no acute findings. Musculoskeletal: Chest wall soft tissues are unremarkable. No acute bony abnormality. IMPRESSION:  Patchy peripheral airspace opacities in both lungs, left greater than right concerning for early pneumonia. Mild cardiomegaly.  Coronary artery disease. 7 mm nodule in the right upper lobe. Non-contrast chest CT at 6-12 months is recommended. If the nodule is stable at time of repeat CT, then future CT at 18-24 months (from today's scan) is considered optional for low-risk patients, but is recommended for high-risk patients. This recommendation follows the consensus statement: Guidelines for Management of Incidental Pulmonary Nodules Detected on CT Images: From the Fleischner Society 2017; Radiology 2017; 284:228-243. Small hiatal hernia. Aortic Atherosclerosis (ICD10-I70.0). Electronically Signed   By: Rolm Baptise M.D.   On: 02/19/2017 07:26    Microbiology: Recent Results (from the past 240 hour(s))  Culture, blood (Routine X 2) w Reflex to ID Panel     Status: None (Preliminary result)   Collection Time: 02/19/17  8:50 AM  Result Value Ref Range Status   Specimen Description   Final    BLOOD RIGHT ANTECUBITAL Performed at  [facility] [address] [place], [place] [address]    Special Requests   Final    BOTTLES DRAWN AEROBIC AND ANAEROBIC Blood Culture adequate volume Performed at [facility] [address], [place], [place] [address]    Culture   Final    NO GROWTH 4 DAYS Performed at [facility], [place] [address] [place], [place] [address]    Report Status PENDING  Incomplete  Culture, blood (Routine X 2) w Reflex to ID Panel     Status: None (Preliminary result)   Collection Time: 02/19/17  6:45 PM  Result Value Ref Range Status   Specimen Description   Final    BLOOD LEFT ANTECUBITAL Performed at [facility] [address] [place], [place] [address]    Special Requests   Final    BOTTLES DRAWN AEROBIC ONLY Blood Culture adequate volume Performed at [facility] [address], [place], [place] [address]    Culture   Final    NO GROWTH 4 DAYS Performed at [facility], [place] [address] [place], [place] [address]    Report Status PENDING  Incomplete  MRSA PCR Screening     Status: None   Collection Time: 02/21/17  4:05 PM  Result Value Ref Range Status   MRSA by PCR NEGATIVE NEGATIVE Final    Comment:        The GeneXpert MRSA Assay (FDA approved for NASAL specimens only), is one component of a comprehensive MRSA colonization surveillance program. It is not intended to diagnose MRSA infection nor to guide or monitor treatment for MRSA infections. Performed at [facility], [place] [address] [place],  [address]      Labs: Basic Metabolic Panel: Recent Labs  Lab 02/20/17 0537 02/21/17 0515 02/22/17 0503 02/23/17 0538 02/24/17 0603  NA 121* 122* 126* 127* 127*  K 4.4 4.3 4.5 4.7 4.8  CL 90* 92* 94* 95* 94*  CO2 21* 21* 22 24 24   GLUCOSE 99 120* 108* 103* 103*  BUN 20 21* 17 15 18   CREATININE 1.34* 1.35* 1.18 1.24 1.35*  CALCIUM 8.7* 8.6* 8.8* 8.8* 8.9    MG  --  1.4* 1.8 1.7 1.6*   Liver Function Tests: Recent Labs  Lab 02/19/17 0625 02/20/17 0537  AST 38 34  ALT 14* 13*  ALKPHOS 107 91  BILITOT 0.7 0.6  PROT 6.9 6.1*  ALBUMIN 3.4* 3.0*  No results for input(s): LIPASE, AMYLASE in the last 168 hours. No results for input(s): AMMONIA in the last 168 hours. CBC: Recent Labs  Lab 02/19/17 0625 02/19/17 0842 02/20/17 0537 02/22/17 0503  WBC 8.0 7.3 5.9 5.4  NEUTROABS 5.5  --   --  3.3  HGB 10.7* 10.3* 9.4* 9.5*  HCT 29.2* 28.4* 26.2* 26.8*  MCV 88.2 88.5 89.4 92.1  PLT 329 299 268 292   Cardiac Enzymes: No results for input(s): CKTOTAL, CKMB, CKMBINDEX, TROPONINI in the last 168 hours. BNP: BNP (last 3 results) No results for input(s): BNP in the last 8760 hours.  ProBNP (last 3 results) No results for input(s): PROBNP in the last 8760 hours.  CBG: Recent Labs  Lab 02/22/17 2120 02/23/17 0758 02/23/17 1625 02/23/17 2119 02/24/17 0759  GLUCAP 111* 105* 132* 113* 98       Signed:  Florencia Reasons MD, PhD  [facility] 02/24/2017, 10:14 AM

## 2017-02-24 NOTE — Progress Notes (Signed)
Patient discharged home, discharge indtructions given and explained to patient/family and they verbalized understanding, patient denies any pain/distress.  No pressure ulcer noted.  Accompanied home by daughter/wife.

## 2017-02-25 DIAGNOSIS — E871 Hypo-osmolality and hyponatremia: Secondary | ICD-10-CM | POA: Diagnosis not present

## 2017-02-25 DIAGNOSIS — R7303 Prediabetes: Secondary | ICD-10-CM | POA: Diagnosis not present

## 2017-02-25 DIAGNOSIS — I10 Essential (primary) hypertension: Secondary | ICD-10-CM | POA: Diagnosis not present

## 2017-02-25 DIAGNOSIS — D500 Iron deficiency anemia secondary to blood loss (chronic): Secondary | ICD-10-CM | POA: Diagnosis not present

## 2017-02-25 DIAGNOSIS — N183 Chronic kidney disease, stage 3 (moderate): Secondary | ICD-10-CM | POA: Diagnosis not present

## 2017-02-27 DIAGNOSIS — E871 Hypo-osmolality and hyponatremia: Secondary | ICD-10-CM | POA: Diagnosis not present

## 2017-02-27 DIAGNOSIS — N183 Chronic kidney disease, stage 3 (moderate): Secondary | ICD-10-CM | POA: Diagnosis not present

## 2017-03-01 DIAGNOSIS — N183 Chronic kidney disease, stage 3 (moderate): Secondary | ICD-10-CM | POA: Diagnosis not present

## 2017-03-01 DIAGNOSIS — E871 Hypo-osmolality and hyponatremia: Secondary | ICD-10-CM | POA: Diagnosis not present

## 2017-03-05 DIAGNOSIS — E871 Hypo-osmolality and hyponatremia: Secondary | ICD-10-CM | POA: Diagnosis not present

## 2017-03-08 DIAGNOSIS — E871 Hypo-osmolality and hyponatremia: Secondary | ICD-10-CM | POA: Diagnosis not present

## 2017-03-13 DIAGNOSIS — E871 Hypo-osmolality and hyponatremia: Secondary | ICD-10-CM | POA: Diagnosis not present

## 2017-03-15 DIAGNOSIS — M25532 Pain in left wrist: Secondary | ICD-10-CM | POA: Diagnosis not present

## 2017-03-18 ENCOUNTER — Other Ambulatory Visit: Payer: Self-pay

## 2017-03-18 ENCOUNTER — Other Ambulatory Visit: Payer: Self-pay | Admitting: Orthopedic Surgery

## 2017-03-18 ENCOUNTER — Encounter (HOSPITAL_COMMUNITY): Payer: Self-pay | Admitting: *Deleted

## 2017-03-18 ENCOUNTER — Telehealth: Payer: Self-pay | Admitting: Cardiovascular Disease

## 2017-03-18 DIAGNOSIS — S52572A Other intraarticular fracture of lower end of left radius, initial encounter for closed fracture: Secondary | ICD-10-CM | POA: Diagnosis not present

## 2017-03-18 DIAGNOSIS — G5602 Carpal tunnel syndrome, left upper limb: Secondary | ICD-10-CM | POA: Diagnosis not present

## 2017-03-18 DIAGNOSIS — M545 Low back pain: Secondary | ICD-10-CM | POA: Diagnosis not present

## 2017-03-18 DIAGNOSIS — M25532 Pain in left wrist: Secondary | ICD-10-CM | POA: Diagnosis not present

## 2017-03-18 NOTE — Progress Notes (Signed)
Anesthesia Chart Review:  Pt is a same day work up  Pt is an 84 year old male scheduled for ORIF distal L radial fracture on 03/19/2017 with Dorna Leitz, MD  - PCP is [name], MD. Pt reports  - Cardiologist is Quay Burow, MD. Last office visit 02/08/16. Pt cleared for surgery with axillary block by Almyra Deforest, PA  PMH includes:  CAD, mitral regurgitation, COPD, CKD, anemia, post-op N/V.  Former smoker. BMI (quit 1963). BMI 22. S/p L TKA 02/15/12  Pt was initially going to see CT Surgeon Dr. Roxy Manns in May 2013 for minimally invasive MVR following RCA PCI however, he was also undergoing a work-up for anemia at that time.  Patient's anemia improved after ablation of his AVMs and his dyspnea also improved.  Subsequently, Cardiologist Dr. Gwenlyn Found felt both interventions could be safely delayed.   - Hospitalized 2/19-2/24/19 for weakness, CAP, severe hyponatremia (thought due to dehydration). Na was 127 at discharge   Medications include: ASA 81mg , lipitor, diltiazem, lisinopril, prilosec  Labs will be obtained day of surgery.   CXR 02/11/17:  - Postsurgical and chronic change of the left lung and hilar region. Slight increased density over the left midlung as this may be due to atelectasis or early infection.  EKG will be obtained day of surgery  CT chest 02/19/17:  - Patchy peripheral airspace opacities in both lungs, left greater than right concerning for early pneumonia. - Mild cardiomegaly.  Coronary artery disease. - 7 mm nodule in the right upper lobe.  Echo 02/22/16:  - Left ventricle: The cavity size was moderately dilated. There was mild concentric hypertrophy. Systolic function was normal. The estimated ejection fraction was in the range of 60% to 65%. Wall motion was normal; there were no regional wall motion abnormalities. Left ventricular diastolic function parameters were normal. - Aortic valve: Transvalvular velocity was within the normal range. There was no stenosis.  There was trivial regurgitation. - Mitral valve: Calcified annulus. Thickening. Mild prolapse, involving the posterior leaflet. There was moderate regurgitation directed anteriorly. - Left atrium: The atrium was severely dilated. - Right ventricle: The cavity size was normal. Wall thickness was normal. Systolic function was normal. - Tricuspid valve: There was mild-moderate regurgitation. - Pulmonic valve: There was moderate regurgitation. - Pulmonary arteries: Systolic pressure was mildly increased. PA peak pressure: 41 mm Hg (S).  Carotid duplex 06/23/13:  - B ICAs demonstrated mild amount of fibrous plaque without significant stenosis.  Cardiac cath 04/30/11:  1. Left main; normal.  2. LAD; minor irregularities.  3. Left circumflex; normal. There was a high marginal branch and the ramus distribution though small in caliber and had a focal 50% proximal stenosis.  4. Right coronary artery; 70% just after the takeoff of a high acute marginal branch.  5. Abdominal aortogram: The thoracic abdominal aorta associated renal abdominal, aortoiliac medication were free of significant atherosclerotic changes.  If labs and EKG acceptable day of surgery, I anticipate pt can proceed with surgery as scheduled.   Willeen Cass, FNP-BC [facility]/Anesthesiology Phone: [phone] 03/18/2017 4:30 PM

## 2017-03-18 NOTE — Telephone Encounter (Signed)
Called Dr Berenice Primas office, and spoke with Juliann Pulse and she stated they will be doing an axillary  block. Hao notified and will fax over preop clearance.  Fax number: [phone]

## 2017-03-18 NOTE — Telephone Encounter (Signed)
   Primary Cardiologist: No primary care provider on file.  Chart reviewed as part of pre-operative protocol coverage. Patient was contacted 03/18/2017 in reference to pre-operative risk assessment for pending surgery as outlined below.  [name] was last seen on 02/08/2016 by Dr. Gwenlyn Found.  Since that day, [name] has done well without any chest pain or SOB upon exertion.  Therefore, based on ACC/AHA guidelines, the patient would be at acceptable risk for the planned procedure without further cardiovascular testing.   I will route this recommendation to the requesting party via Epic fax function and remove from pre-op pool.  Please call with questions.  [place], [place] 03/18/2017, 2:29 PM

## 2017-03-18 NOTE — Telephone Encounter (Signed)
I have called the patient, apparently he went to an [place] last Friday and after getting out of his car, tripped and fell on his left side. He broke his left write and was seen by Dr. Berenice Primas today who plan for surgery today.   Patient denies any CP or SOB recently. He had the flu in mid Feb and had hyponatremia and was admitted. Since discharge, he is getting stronger. He mainly ambulate at home. H/o possible severe MR, although last echo in 2018 suggested moderate MR.   Per patient, he is under impression that surgery will not be general anesthesia instead will be arm block. Will confirm with surgeon's office. If so, he likely will not need additional workup prior to the surgery tomorrow. Will discuss with Dr. Gwenlyn Found.  Hilbert Corrigan PA Pager: [phone]

## 2017-03-18 NOTE — Telephone Encounter (Signed)
Returned call to [facility] @ Dr. Berenice Primas' office. Patient has a fractured wrist - scheduled for surgery tomorrow @ 11:30 at [facility]. Patient has NOT seen MD since 02/2016 - last echo was then as well.   OK to leave a message for Juliann Pulse if patient is cleared or not

## 2017-03-18 NOTE — Progress Notes (Signed)
Spoke with pt for pre-op call. Pt has hx of CAD and mitral regurgitation, cardiologist is Dr. Gwenlyn Found. Cardiac clearance in Epic from today. Pt was in the hospital a few weeks ago with very low sodium. He states he's been drinking Pedialyte and Gatorade and had his blood checked last week at Dr. Mathis Fare office and he was told his sodium was up to 136. Pt states he is not diabetic.

## 2017-03-18 NOTE — Telephone Encounter (Signed)
Juliann Pulse states that she really needs to speak with nurse today.

## 2017-03-19 ENCOUNTER — Ambulatory Visit (HOSPITAL_COMMUNITY)
Admission: RE | Admit: 2017-03-19 | Discharge: 2017-03-19 | Disposition: A | Discharge status: Home / Self Care | Payer: PPO | Source: Ambulatory Visit | Attending: Orthopedic Surgery | Admitting: Orthopedic Surgery

## 2017-03-19 ENCOUNTER — Ambulatory Visit (HOSPITAL_COMMUNITY): Payer: PPO | Admitting: Emergency Medicine

## 2017-03-19 ENCOUNTER — Encounter (HOSPITAL_COMMUNITY)
Admission: RE | Disposition: A | Discharge status: Home / Self Care | Payer: Self-pay | Source: Ambulatory Visit | Attending: Orthopedic Surgery

## 2017-03-19 ENCOUNTER — Encounter (HOSPITAL_COMMUNITY): Payer: Self-pay

## 2017-03-19 DIAGNOSIS — N400 Enlarged prostate without lower urinary tract symptoms: Secondary | ICD-10-CM | POA: Diagnosis not present

## 2017-03-19 DIAGNOSIS — I340 Nonrheumatic mitral (valve) insufficiency: Secondary | ICD-10-CM | POA: Insufficient documentation

## 2017-03-19 DIAGNOSIS — S52572A Other intraarticular fracture of lower end of left radius, initial encounter for closed fracture: Secondary | ICD-10-CM | POA: Insufficient documentation

## 2017-03-19 DIAGNOSIS — I10 Essential (primary) hypertension: Secondary | ICD-10-CM | POA: Diagnosis not present

## 2017-03-19 DIAGNOSIS — Z85828 Personal history of other malignant neoplasm of skin: Secondary | ICD-10-CM | POA: Insufficient documentation

## 2017-03-19 DIAGNOSIS — E785 Hyperlipidemia, unspecified: Secondary | ICD-10-CM | POA: Insufficient documentation

## 2017-03-19 DIAGNOSIS — Z96652 Presence of left artificial knee joint: Secondary | ICD-10-CM | POA: Insufficient documentation

## 2017-03-19 DIAGNOSIS — J449 Chronic obstructive pulmonary disease, unspecified: Secondary | ICD-10-CM | POA: Diagnosis not present

## 2017-03-19 DIAGNOSIS — Z7982 Long term (current) use of aspirin: Secondary | ICD-10-CM | POA: Diagnosis not present

## 2017-03-19 DIAGNOSIS — N189 Chronic kidney disease, unspecified: Secondary | ICD-10-CM | POA: Diagnosis not present

## 2017-03-19 DIAGNOSIS — I2510 Atherosclerotic heart disease of native coronary artery without angina pectoris: Secondary | ICD-10-CM | POA: Insufficient documentation

## 2017-03-19 DIAGNOSIS — Z87891 Personal history of nicotine dependence: Secondary | ICD-10-CM | POA: Insufficient documentation

## 2017-03-19 DIAGNOSIS — M1990 Unspecified osteoarthritis, unspecified site: Secondary | ICD-10-CM | POA: Insufficient documentation

## 2017-03-19 DIAGNOSIS — M7989 Other specified soft tissue disorders: Secondary | ICD-10-CM | POA: Insufficient documentation

## 2017-03-19 DIAGNOSIS — X58XXXA Exposure to other specified factors, initial encounter: Secondary | ICD-10-CM | POA: Insufficient documentation

## 2017-03-19 DIAGNOSIS — I129 Hypertensive chronic kidney disease with stage 1 through stage 4 chronic kidney disease, or unspecified chronic kidney disease: Secondary | ICD-10-CM | POA: Insufficient documentation

## 2017-03-19 DIAGNOSIS — Z79899 Other long term (current) drug therapy: Secondary | ICD-10-CM | POA: Diagnosis not present

## 2017-03-19 DIAGNOSIS — S52502A Unspecified fracture of the lower end of left radius, initial encounter for closed fracture: Secondary | ICD-10-CM | POA: Diagnosis present

## 2017-03-19 HISTORY — DX: Personal history of urinary calculi: Z87.442

## 2017-03-19 HISTORY — PX: OPEN REDUCTION INTERNAL FIXATION (ORIF) DISTAL RADIAL FRACTURE: SHX5989

## 2017-03-19 HISTORY — DX: Gastro-esophageal reflux disease without esophagitis: K21.9

## 2017-03-19 HISTORY — DX: Influenza due to unidentified influenza virus with other respiratory manifestations: J11.1

## 2017-03-19 HISTORY — DX: Malignant (primary) neoplasm, unspecified: C80.1

## 2017-03-19 HISTORY — DX: Pneumonia, unspecified organism: J18.9

## 2017-03-19 LAB — CBC
HEMATOCRIT: 29.8 % — AB (ref 39.0–52.0)
Hemoglobin: 10.1 g/dL — ABNORMAL LOW (ref 13.0–17.0)
MCH: 32.5 pg (ref 26.0–34.0)
MCHC: 33.9 g/dL (ref 30.0–36.0)
MCV: 95.8 fL (ref 78.0–100.0)
Platelets: 210 10*3/uL (ref 150–400)
RBC: 3.11 MIL/uL — ABNORMAL LOW (ref 4.22–5.81)
RDW: 14.6 % (ref 11.5–15.5)
WBC: 7.3 10*3/uL (ref 4.0–10.5)

## 2017-03-19 LAB — BASIC METABOLIC PANEL
Anion gap: 10 (ref 5–15)
BUN: 27 mg/dL — ABNORMAL HIGH (ref 6–20)
CHLORIDE: 104 mmol/L (ref 101–111)
CO2: 20 mmol/L — AB (ref 22–32)
CREATININE: 1.62 mg/dL — AB (ref 0.61–1.24)
Calcium: 9.3 mg/dL (ref 8.9–10.3)
GFR calc non Af Amer: 36 mL/min — ABNORMAL LOW (ref >60)
GFR, EST AFRICAN AMERICAN: 42 mL/min — AB (ref >60)
GLUCOSE: 139 mg/dL — AB (ref 65–99)
Potassium: 4.6 mmol/L (ref 3.5–5.1)
Sodium: 134 mmol/L — ABNORMAL LOW (ref 135–145)

## 2017-03-19 LAB — SURGICAL PCR SCREEN
MRSA, PCR: NEGATIVE
STAPHYLOCOCCUS AUREUS: NEGATIVE

## 2017-03-19 SURGERY — OPEN REDUCTION INTERNAL FIXATION (ORIF) DISTAL RADIUS FRACTURE
Anesthesia: Regional | Site: Arm Lower | Laterality: Left

## 2017-03-19 MED ORDER — FENTANYL CITRATE (PF) 100 MCG/2ML IJ SOLN
100.0000 ug | Freq: Once | INTRAMUSCULAR | Status: AC
  Administered 2017-03-19: 50 ug via INTRAVENOUS

## 2017-03-19 MED ORDER — PROPOFOL 10 MG/ML IV BOLUS
INTRAVENOUS | Status: AC
  Filled 2017-03-19: qty 20

## 2017-03-19 MED ORDER — ONDANSETRON HCL 4 MG/2ML IJ SOLN
INTRAMUSCULAR | Status: AC
  Filled 2017-03-19: qty 2

## 2017-03-19 MED ORDER — PROPOFOL 500 MG/50ML IV EMUL
INTRAVENOUS | Status: DC | PRN
  Administered 2017-03-19: 60 ug/kg/min via INTRAVENOUS

## 2017-03-19 MED ORDER — POVIDONE-IODINE 10 % EX SWAB: 2.0000 "application " | Freq: Once | CUTANEOUS | Status: DC

## 2017-03-19 MED ORDER — FENTANYL CITRATE (PF) 100 MCG/2ML IJ SOLN
INTRAMUSCULAR | Status: AC
  Administered 2017-03-19: 50 ug via INTRAVENOUS
  Filled 2017-03-19: qty 2

## 2017-03-19 MED ORDER — PROPOFOL 10 MG/ML IV BOLUS
INTRAVENOUS | Status: DC | PRN
  Administered 2017-03-19: 15 mg via INTRAVENOUS
  Administered 2017-03-19 (×2): 10 mg via INTRAVENOUS
  Administered 2017-03-19: 15 mg via INTRAVENOUS

## 2017-03-19 MED ORDER — FENTANYL CITRATE (PF) 100 MCG/2ML IJ SOLN
INTRAMUSCULAR | Status: DC | PRN
  Administered 2017-03-19: 50 ug via INTRAVENOUS

## 2017-03-19 MED ORDER — FENTANYL CITRATE (PF) 100 MCG/2ML IJ SOLN: 25.0000 ug | INTRAMUSCULAR | Status: DC | PRN

## 2017-03-19 MED ORDER — MIDAZOLAM HCL 2 MG/2ML IJ SOLN: 2.0000 mg | Freq: Once | INTRAMUSCULAR | Status: DC

## 2017-03-19 MED ORDER — ONDANSETRON HCL 4 MG/2ML IJ SOLN: 4.0000 mg | Freq: Once | INTRAMUSCULAR | Status: DC | PRN

## 2017-03-19 MED ORDER — BUPIVACAINE-EPINEPHRINE (PF) 0.5% -1:200000 IJ SOLN
INTRAMUSCULAR | Status: DC | PRN
  Administered 2017-03-19: 30 mL via PERINEURAL

## 2017-03-19 MED ORDER — CEFAZOLIN SODIUM-DEXTROSE 2-4 GM/100ML-% IV SOLN
2.0000 g | INTRAVENOUS | Status: AC
  Administered 2017-03-19: 2 g via INTRAVENOUS

## 2017-03-19 MED ORDER — CHLORHEXIDINE GLUCONATE 4 % EX LIQD: 60.0000 mL | Freq: Once | CUTANEOUS | Status: DC

## 2017-03-19 MED ORDER — EPHEDRINE SULFATE 50 MG/ML IJ SOLN
INTRAMUSCULAR | Status: DC | PRN
  Administered 2017-03-19 (×3): 10 mg via INTRAVENOUS

## 2017-03-19 MED ORDER — OXYCODONE HCL 5 MG/5ML PO SOLN: 5.0000 mg | Freq: Once | ORAL | Status: DC | PRN

## 2017-03-19 MED ORDER — LACTATED RINGERS IV SOLN
INTRAVENOUS | Status: DC
  Administered 2017-03-19: 10:00:00 via INTRAVENOUS

## 2017-03-19 MED ORDER — OXYCODONE HCL 5 MG PO TABS: 5.0000 mg | ORAL_TABLET | Freq: Once | ORAL | Status: DC | PRN

## 2017-03-19 MED ORDER — ONDANSETRON HCL 4 MG/2ML IJ SOLN
INTRAMUSCULAR | Status: DC | PRN
  Administered 2017-03-19: 4 mg via INTRAVENOUS

## 2017-03-19 MED ORDER — TRAMADOL HCL 50 MG PO TABS: 50.0000 mg | ORAL_TABLET | Freq: Four times a day (QID) | ORAL | 0 refills | Status: DC | PRN

## 2017-03-19 MED ORDER — FENTANYL CITRATE (PF) 250 MCG/5ML IJ SOLN
INTRAMUSCULAR | Status: AC
  Filled 2017-03-19: qty 5

## 2017-03-19 MED ORDER — 0.9 % SODIUM CHLORIDE (POUR BTL) OPTIME
TOPICAL | Status: DC | PRN
  Administered 2017-03-19: 1000 mL

## 2017-03-19 MED ORDER — CEFAZOLIN SODIUM-DEXTROSE 2-4 GM/100ML-% IV SOLN
INTRAVENOUS | Status: AC
  Filled 2017-03-19: qty 100

## 2017-03-19 MED ORDER — MIDAZOLAM HCL 2 MG/2ML IJ SOLN
INTRAMUSCULAR | Status: AC
  Filled 2017-03-19: qty 2

## 2017-03-19 MED ORDER — DEXAMETHASONE SODIUM PHOSPHATE 10 MG/ML IJ SOLN
INTRAMUSCULAR | Status: DC | PRN
  Administered 2017-03-19: 5 mg

## 2017-03-19 SURGICAL SUPPLY — 60 items
BANDAGE ACE 3X5.8 VEL STRL LF (GAUZE/BANDAGES/DRESSINGS) ×2 IMPLANT
BANDAGE ACE 4X5 VEL STRL LF (GAUZE/BANDAGES/DRESSINGS) ×2 IMPLANT
BANDAGE ELASTIC 3 VELCRO ST LF (GAUZE/BANDAGES/DRESSINGS) ×2 IMPLANT
BANDAGE ELASTIC 4 VELCRO ST LF (GAUZE/BANDAGES/DRESSINGS) ×2 IMPLANT
BIT DRILL 2.2 SS TIBIAL (BIT) ×2 IMPLANT
BLADE CLIPPER SURG (BLADE) IMPLANT
BNDG ESMARK 4X9 LF (GAUZE/BANDAGES/DRESSINGS) ×2 IMPLANT
BNDG GAUZE ELAST 4 BULKY (GAUZE/BANDAGES/DRESSINGS) ×2 IMPLANT
COVER SURGICAL LIGHT HANDLE (MISCELLANEOUS) ×2 IMPLANT
CUFF TOURNIQUET SINGLE 18IN (TOURNIQUET CUFF) ×2 IMPLANT
CUFF TOURNIQUET SINGLE 24IN (TOURNIQUET CUFF) IMPLANT
DRAPE OEC MINIVIEW 54X84 (DRAPES) IMPLANT
DRAPE U-SHAPE 47X51 STRL (DRAPES) ×2 IMPLANT
ELECT REM PT RETURN 9FT ADLT (ELECTROSURGICAL)
ELECTRODE REM PT RTRN 9FT ADLT (ELECTROSURGICAL) IMPLANT
GAUZE SPONGE 4X4 12PLY STRL (GAUZE/BANDAGES/DRESSINGS) ×2 IMPLANT
GAUZE SPONGE 4X4 12PLY STRL LF (GAUZE/BANDAGES/DRESSINGS) ×2 IMPLANT
GAUZE XEROFORM 5X9 LF (GAUZE/BANDAGES/DRESSINGS) ×2 IMPLANT
GLOVE BIO SURGEON STRL SZ7.5 (GLOVE) ×4 IMPLANT
GLOVE BIOGEL PI IND STRL 8 (GLOVE) ×2 IMPLANT
GLOVE BIOGEL PI INDICATOR 8 (GLOVE) ×2
GOWN STRL REUS W/ TWL LRG LVL3 (GOWN DISPOSABLE) ×1 IMPLANT
GOWN STRL REUS W/ TWL XL LVL3 (GOWN DISPOSABLE) ×2 IMPLANT
GOWN STRL REUS W/TWL LRG LVL3 (GOWN DISPOSABLE) ×1
GOWN STRL REUS W/TWL XL LVL3 (GOWN DISPOSABLE) ×2
K-WIRE 1.6 (WIRE) ×2
K-WIRE FX5X1.6XNS BN SS (WIRE) ×2
KIT BASIN OR (CUSTOM PROCEDURE TRAY) ×2 IMPLANT
KIT ROOM TURNOVER OR (KITS) ×2 IMPLANT
KWIRE FX5X1.6XNS BN SS (WIRE) ×2 IMPLANT
MANIFOLD NEPTUNE II (INSTRUMENTS) ×2 IMPLANT
NEEDLE 22X1 1/2 (OR ONLY) (NEEDLE) IMPLANT
NS IRRIG 1000ML POUR BTL (IV SOLUTION) ×2 IMPLANT
PACK ORTHO EXTREMITY (CUSTOM PROCEDURE TRAY) ×2 IMPLANT
PAD ARMBOARD 7.5X6 YLW CONV (MISCELLANEOUS) ×4 IMPLANT
PAD CAST 3X4 CTTN HI CHSV (CAST SUPPLIES) ×1 IMPLANT
PAD CAST 4YDX4 CTTN HI CHSV (CAST SUPPLIES) ×1 IMPLANT
PADDING CAST COTTON 3X4 STRL (CAST SUPPLIES) ×1
PADDING CAST COTTON 4X4 STRL (CAST SUPPLIES) ×1
PEG LOCKING SMOOTH 2.2X18 (Peg) ×2 IMPLANT
PEG LOCKING SMOOTH 2.2X20 (Screw) ×8 IMPLANT
PEG LOCKING SMOOTH 2.2X22 (Screw) ×4 IMPLANT
PLATE MEDIUM DVR LEFT (Plate) ×2 IMPLANT
SCREW  LP NL 2.7X15MM (Screw) ×1 IMPLANT
SCREW  LP NL 2.7X16MM (Screw) ×1 IMPLANT
SCREW 2.7X14MM (Screw) ×3 IMPLANT
SCREW BN 14X2.7XNONLOCK 3 LD (Screw) ×3 IMPLANT
SCREW LP NL 2.7X15MM (Screw) ×1 IMPLANT
SCREW LP NL 2.7X16MM (Screw) ×1 IMPLANT
SPLINT PLASTER EXTRA FAST 3X15 (CAST SUPPLIES) ×1
SPLINT PLASTER GYPS XFAST 3X15 (CAST SUPPLIES) ×1 IMPLANT
SPONGE LAP 4X18 X RAY DECT (DISPOSABLE) ×2 IMPLANT
SUCTION FRAZIER HANDLE 10FR (MISCELLANEOUS) ×1
SUCTION TUBE FRAZIER 10FR DISP (MISCELLANEOUS) ×1 IMPLANT
SUT ETHILON 4 0 PS 2 18 (SUTURE) ×2 IMPLANT
SYR CONTROL 10ML LL (SYRINGE) IMPLANT
TOWEL OR 17X24 6PK STRL BLUE (TOWEL DISPOSABLE) ×2 IMPLANT
TOWEL OR 17X26 10 PK STRL BLUE (TOWEL DISPOSABLE) ×2 IMPLANT
TUBE CONNECTING 12X1/4 (SUCTIONS) ×2 IMPLANT
WATER STERILE IRR 1000ML POUR (IV SOLUTION) ×2 IMPLANT

## 2017-03-19 NOTE — Transfer of Care (Signed)
Immediate Anesthesia Transfer of Care Note  Patient: [name]  Procedure(s) Performed: OPEN REDUCTION INTERNAL FIXATION (ORIF) DISTAL RADIAL FRACTURE (Left Arm Lower)  Patient Location: PACU  Anesthesia Type:General  Level of Consciousness: awake, alert , oriented and patient cooperative  Airway & Oxygen Therapy: Patient Spontanous Breathing  Post-op Assessment: Report given to RN and Post -op Vital signs reviewed and stable  Post vital signs: Reviewed and stable  Last Vitals:  Vitals:   03/19/17 0910 03/19/17 1328  BP: (!) 176/58 (!) 127/54  Pulse: 74 71  Resp: 18 13  Temp: 36.4 C (!) 36.3 C  SpO2: 100% 98%    Last Pain:  Vitals:   03/19/17 1328  TempSrc:   PainSc: (P) 0-No pain      Patients Stated Pain Goal: 2 (22/33/61 2244)  Complications: No apparent anesthesia complications

## 2017-03-19 NOTE — H&P (Signed)
PREOPERATIVE H&P  Chief Complaint: l wrist pain  HPI: [name] is a 84 y.o. male who presents for evaluation of l wrist pain. It has been present for 5 days and has been worsening. He has failed conservative measures. Pain is rated as moderate.  Past Medical History:  Diagnosis Date  . Anemia    low hemoglobin  . Arthritis   . AV (angiodysplasia malformation of colon)   . Benign prostatic hypertrophy   . Cancer ([place])    basal cell cancer  . Chronic kidney disease    elevated Cr, BUN, Dr. Hassell Done manages  . COPD (chronic obstructive pulmonary disease) ([place])   . Coronary artery disease   . GERD (gastroesophageal reflux disease)   . Gout   . Heart murmur   . History of kidney stones   . Hyperlipidemia   . Hypertension   . Influenza    February 2019  . Mitral regurgitation   . Mitral valve prolapse   . Pneumonia 1970's  . PONV (postoperative nausea and vomiting)    1st knee replacement  . Shortness of breath    Past Surgical History:  Procedure Laterality Date  . ABDOMINAL ANGIOGRAM  04/30/2011   Procedure: ABDOMINAL ANGIOGRAM;  Surgeon: Lorretta Harp, MD;  Location: [facility] CATH LAB;  Service: Cardiovascular;;  . CARDIAC CATHETERIZATION  04/2011   x2  . CARDIAC CATHETERIZATION  08/2009  . CATARACT EXTRACTION  2011   bilateral  . COLONOSCOPY  05/04/2011   Procedure: COLONOSCOPY;  Surgeon: Beryle Beams, MD;  Location: [facility] ENDOSCOPY;  Service: Endoscopy;  Laterality: N/A;  . COLONOSCOPY N/A 07/16/2013   Procedure: COLONOSCOPY;  Surgeon: Beryle Beams, MD;  Location: [place];  Service: Endoscopy;  Laterality: N/A;  . CORONARY ANGIOGRAM  04/30/2011   Procedure: CORONARY ANGIOGRAM;  Surgeon: Lorretta Harp, MD;  Location: [facility] CATH LAB;  Service: Cardiovascular;;  . ESOPHAGOGASTRODUODENOSCOPY  05/04/2011   Procedure: ESOPHAGOGASTRODUODENOSCOPY (EGD);  Surgeon: Beryle Beams, MD;  Location: [facility] ENDOSCOPY;  Service: Endoscopy;  Laterality: N/A;  . EYE SURGERY     bilateral  cataracts removed  . HERNIA REPAIR  1999&2000   bilateral  . LOBECTOMY  1980   left lower lobectomy for benign tumor  . REPLACEMENT TOTAL KNEE  2005   right  . RIGHT HEART CATHETERIZATION  04/30/2011   Procedure: RIGHT HEART CATH;  Surgeon: Lorretta Harp, MD;  Location: [facility] CATH LAB;  Service: Cardiovascular;;  . TEE WITHOUT CARDIOVERSION  04/30/2011   Procedure: TRANSESOPHAGEAL ECHOCARDIOGRAM (TEE);  Surgeon: Sanda Klein, MD;  Location: [facility] ENDOSCOPY;  Service: Cardiovascular;  Laterality: N/A;  3D TEE/ patient will be admitted the day before test , therefo patient will be a inpatient the day of TEE  . TOTAL KNEE ARTHROPLASTY Left 02/15/2012   Procedure: TOTAL KNEE ARTHROPLASTY;  Surgeon: Alta Corning, MD;  Location: [place];  Service: Orthopedics;  Laterality: Left;   Social History   Socioeconomic History  . Marital status: Married    Spouse name: None  . Number of children: None  . Years of education: None  . Highest education level: None  Social Needs  . Financial resource strain: None  . Food insecurity - worry: None  . Food insecurity - inability: None  . Transportation needs - medical: None  . Transportation needs - non-medical: None  Occupational History  . None  Tobacco Use  . Smoking status: Former Smoker    Years: 20.00    Last attempt  to quit: 02/08/1961    Years since quitting: 56.1  . Smokeless tobacco: Former Systems developer    Quit date: 02/08/1961  Substance and Sexual Activity  . Alcohol use: Yes    Alcohol/week: 4.2 oz    Types: 7 Standard drinks or equivalent per week    Comment: 2ozs Building services engineer daily  . Drug use: No  . Sexual activity: Not Currently  Other Topics Concern  . None  Social History Narrative  . None   Family History  Problem Relation Age of Onset  . Hypertension Mother   . Osteoporosis Mother   . Lung cancer Father   . Irregular heart beat Brother   . Heart attack Maternal Grandfather   . Cancer Paternal Grandfather    No Known  Allergies Prior to Admission medications   Medication Sig Start Date End Date Taking? Authorizing Provider  acetaminophen (TYLENOL) 325 MG tablet Take 650 mg by mouth every 6 (six) hours as needed for mild pain or headache.   Yes [provider]  allopurinol (ZYLOPRIM) 300 MG tablet Take 150 mg by mouth daily.    Yes [provider]  aspirin EC 81 MG tablet Take 81 mg by mouth daily.   Yes [provider]  atorvastatin (LIPITOR) 10 MG tablet Take 1 tablet (10 mg total) by mouth daily at 6 PM. 05/10/16  Yes Lorretta Harp, MD  diltiazem (CARDIZEM CD) 300 MG 24 hr capsule Take 300 mg by mouth daily.   Yes [provider]  lisinopril (PRINIVIL,ZESTRIL) 40 MG tablet Take 40 mg by mouth daily.   Yes [provider]  omeprazole (PRILOSEC OTC) 20 MG tablet Take 20 mg by mouth every other day.   Yes [provider]  polyethylene glycol (MIRALAX / GLYCOLAX) packet Take 17 g by mouth daily.   Yes [provider]     Positive ROS: none  All other systems have been reviewed and were otherwise negative with the exception of those mentioned in the HPI and as above.  Physical Exam: Vitals:   03/19/17 0910  BP: (!) 176/58  Pulse: 74  Resp: 18  Temp: 97.6 F (36.4 C)  SpO2: 100%    General: Alert, no acute distress Cardiovascular: No pedal edema Respiratory: No cyanosis, no use of accessory musculature GI: No organomegaly, abdomen is soft and non-tender Skin: No lesions in the area of chief complaint Neurologic: Sensation intact distally Psychiatric: Patient is competent for consent with normal mood and affect Lymphatic: No axillary or cervical lymphadenopathy  MUSCULOSKELETAL: l wrist: painful rom limited rom   Assessment/Plan: [place] for Procedure(s): OPEN REDUCTION INTERNAL FIXATION (ORIF) DISTAL RADIAL FRACTURE  The risks benefits and alternatives were discussed with the patient including but  not limited to the risks of nonoperative treatment, versus surgical intervention including infection, bleeding, nerve injury, malunion, nonunion, hardware prominence, hardware failure, need for hardware removal, blood clots, cardiopulmonary complications, morbidity, mortality, among others, and they were willing to proceed.  Predicted outcome is good, although there will be at least a six to nine month expected recovery.  Alta Corning, MD 03/19/2017 11:42 AM

## 2017-03-19 NOTE — Brief Op Note (Signed)
03/19/2017  1:07 PM  PATIENT:  [name]  84 y.o. male  PRE-OPERATIVE DIAGNOSIS:  LEFT DISTAL RADIUS FRACTURE  POST-OPERATIVE DIAGNOSIS:  LEFT DISTAL RADIUS FRACTURE  PROCEDURE:  Procedure(s) with comments: OPEN REDUCTION INTERNAL FIXATION (ORIF) DISTAL RADIAL FRACTURE (Left) - AXILLARY BLOCK  SURGEON:  Surgeon(s) and Role:    Dorna Leitz, MD - Primary  PHYSICIAN ASSISTANT:   ASSISTANTS: bethune   ANESTHESIA:   regional  EBL:  10 mL   BLOOD ADMINISTERED:none  DRAINS: none   LOCAL MEDICATIONS USED:  MARCAINE     SPECIMEN:  No Specimen  DISPOSITION OF SPECIMEN:  N/A  COUNTS:  YES  TOURNIQUET:  * Missing tourniquet times found for documented tourniquets in log: [id number] *  DICTATION: .Other Dictation: Dictation Number [id number]  PLAN OF CARE: Admit to inpatient   PATIENT DISPOSITION:  PACU - hemodynamically stable.   Delay start of Pharmacological VTE agent (>24hrs) due to surgical blood loss or risk of bleeding: no

## 2017-03-19 NOTE — Anesthesia Procedure Notes (Signed)
Anesthesia Regional Block: Axillary brachial plexus block   Pre-Anesthetic Checklist: ,, timeout performed, Correct Patient, Correct Site, Correct Laterality, Correct Procedure, Correct Position, site marked, Risks and benefits discussed,  Surgical consent,  Pre-op evaluation,  At surgeon's request and post-op pain management  Laterality: Left  Prep: chloraprep       Needles:  Injection technique: Single-shot  Needle Type: Echogenic Needle     Needle Length: 9cm  Needle Gauge: 21     Additional Needles:   Narrative:  Start time: 03/19/2017 10:35 AM End time: 03/19/2017 10:40 AM Injection made incrementally with aspirations every 5 mL.  Performed by: Personally  Anesthesiologist: Audry Pili, MD  Additional Notes: No pain on injection. No increased resistance to injection. Injection made in 5cc increments. Good needle visualization. Patient tolerated the procedure well.

## 2017-03-19 NOTE — Discharge Instructions (Signed)
Elevate your left arm above your heart as much as possible for the next few days. Move your fingers as tolerated. Keep dressing clean and dry. Wear sling for comfort for a couple of days and then discontinue it.  You can remove it at any time if you would like once your block wears off.

## 2017-03-19 NOTE — Anesthesia Preprocedure Evaluation (Addendum)
Anesthesia Evaluation  Patient identified by MRN, date of birth, ID band Patient awake    Reviewed: Allergy & Precautions, H&P , NPO status , Patient's Chart, lab work & pertinent test results  History of Anesthesia Complications (+) PONV and history of anesthetic complications  Airway Mallampati: II  TM Distance: >3 FB Neck ROM: Full    Dental no notable dental hx. (+) Dental Advisory Given, Partial Lower   Pulmonary COPD, former smoker,  S/p Left lower lobectomy for lung mass   Pulmonary exam normal breath sounds clear to auscultation       Cardiovascular hypertension, Pt. on medications + CAD  Normal cardiovascular exam+ Valvular Problems/Murmurs MVP and MR  Rhythm:Regular Rate:Normal  '18 TTE - Moderately dilated LV with mild concentric hypertrophy. EF 60% to 65%. Trivial AI. Mild MVP,   involving the posterior leaflet with moderate regurgitation   directed anteriorly. Left atrium was severely dilated. Mild-moderate TR. Moderate PR. PASP was mildly increased. PA  peak pressure: 41 mm Hg  '13 Cath - Cardiac cath 04/30/11:  1. Left main; normal.  2. LAD; minor irregularities.  3. Left circumflex; normal. There was a high marginal branch and the ramus distribution though small in caliber and had a focal 50% proximal stenosis.  4. Right coronary artery; 70% just after the takeoff of a high acute marginal branch.  5. Abdominal aortogram: The thoracic abdominal aorta associated renal abdominal, aortoiliac medication were free of significant atherosclerotic changes.     Neuro/Psych Some occasional numbness and tingling in left arm/hand since injury negative neurological ROS  negative psych ROS   GI/Hepatic Neg liver ROS, GERD  Medicated and Controlled,  Endo/Other  negative endocrine ROS  Renal/GU Nephrolithiasis  negative genitourinary   Musculoskeletal  (+) Arthritis , Gout   Abdominal Normal abdominal exam  (+)    Peds  Hematology negative hematology ROS (+)   Anesthesia Other Findings   Reproductive/Obstetrics negative OB ROS                           Anesthesia Physical  Anesthesia Plan  ASA: III  Anesthesia Plan: Regional   Post-op Pain Management:    Induction:   PONV Risk Score and Plan: Propofol infusion and Treatment may vary due to age or medical condition  Airway Management Planned: Natural Airway and Simple Face Mask  Additional Equipment: None  Intra-op Plan:   Post-operative Plan:   Informed Consent: I have reviewed the patients History and Physical, chart, labs and discussed the procedure including the risks, benefits and alternatives for the proposed anesthesia with the patient or authorized representative who has indicated his/her understanding and acceptance.     Plan Discussed with: Anesthesiologist and CRNA  Anesthesia Plan Comments:        Anesthesia Quick Evaluation

## 2017-03-19 NOTE — Anesthesia Procedure Notes (Signed)
Procedure Name: MAC Date/Time: 03/19/2017 11:52 AM Performed by: White, Amedeo Plenty, CRNA Pre-anesthesia Checklist: Patient identified, Emergency Drugs available, Suction available and Patient being monitored Patient Re-evaluated:Patient Re-evaluated prior to induction Oxygen Delivery Method: Simple face mask

## 2017-03-19 NOTE — Op Note (Signed)
[name], [name]              ACCOUNT NO.:  [id number]  MEDICAL RECORD NO.:  [id number]  LOCATION:  [facility]                         FACILITY:  [place]  PHYSICIAN:  Alta Corning, M.D.   DATE OF BIRTH:  12-26-1925  DATE OF PROCEDURE:  03/19/2017 DATE OF DISCHARGE:  03/19/2017                              OPERATIVE REPORT   PREOPERATIVE DIAGNOSIS:  Distal radius fracture with intra-articular extension and significant swelling, status post closed reduction.  POSTOPERATIVE DIAGNOSIS:  Distal radius fracture with intra-articular extension and significant swelling, status post closed reduction.  PROCEDURES: 1. Open reduction and internal fixation of distal radius fracture with     fixation of greater than 3 articular fragments. 2. Carpal tunnel release. 3. Interpretation of multiple intraoperative fluoroscopic images.  SURGEON:  Alta Corning, M.D.  ASSISTANT:  Gary Fleet, P.A.  ANESTHESIA:  Regional.  BRIEF HISTORY:  [name] is an 84 year old male with a history of having falling onto an outstretched left arm.  He was seen in the office where he was noted to have a markedly displaced distal radius fracture. Manipulative closed reduction was undertaken and an adequate reduction was achieved.  At this point, we saw him in the office and felt that he was really struggling with a sugar-tong splint.  We felt that open reduction and internal fixation would allow him better function as well as a better fixation and alignment.  He was brought to the operating room for this procedure.  Because of the massive swelling in his fingers preoperatively, we felt that carpal tunnel release was probably appropriate so that he did not develop postoperative issues and this was chosen to be done preoperatively.  DESCRIPTION OF PROCEDURE:  The patient was taken to the operating room and after adequate anesthesia was obtained with a regional anesthetic, the patient was placed supine on the  operating table.  The left arm was prepped and draped in usual sterile fashion.  Following this, arm was exsanguinated and blood pressure tourniquet was inflated to 250 mmHg. Following this, an incision was made over the FCR tendon.  Subcutaneous tissue was taken down the level of the tendon.  The tendon sheath was exploited.  Went radially to the tendon and dissected down to the distal radius.  Released the pronator on the distal radius and identified the fracture.  Manipulative closed reduction was undertaken; and at this point, a 4-hole medium-sized plate was chosen and this was applied to the bone in the appropriate position distally.  Once fluoro was used multiple times to assess the length, alignment, and adequacy of reduction, the final plate was then chosen and locked in place with 4 screws proximally and 7 screws distally.  Anatomic reduction was achieved.  At this point, the wound was irrigated and suctioned dry.  We did look at the median nerve and we were able to identify the volar carpal ligament.  The volar carpal ligament was released under direct vision through this midline incision.  Once this was done, the nerve was obviously free at this point.  A gloved finger could be placed above the nerve down into the palm.  At this point, attention was turned back to the  hand where the pronator quadratus was repaired with 4-0 Vicryl interrupted sutures.  The skin was then closed with 3-0 Vicryl and Monocryl subcuticular.  Benzoin and Steri-Strips were applied.  Sterile compressive dressing was applied.  The patient was taken to recovery where he was noted be in satisfactory condition.  Estimated blood loss for the procedure was minimal.     Alta Corning, M.D.     Corliss Skains  D:  03/19/2017  T:  03/19/2017  Job:  [id number]

## 2017-03-19 NOTE — Anesthesia Postprocedure Evaluation (Signed)
Anesthesia Post Note  Patient: [name]  Procedure(s) Performed: OPEN REDUCTION INTERNAL FIXATION (ORIF) DISTAL RADIAL FRACTURE (Left Arm Lower)     Patient location during evaluation: PACU Anesthesia Type: Regional Level of consciousness: awake and alert Pain management: pain level controlled Vital Signs Assessment: post-procedure vital signs reviewed and stable Respiratory status: spontaneous breathing, nonlabored ventilation and respiratory function stable Cardiovascular status: stable and blood pressure returned to baseline Anesthetic complications: no    Last Vitals:  Vitals:   03/19/17 1357 03/19/17 1425  BP: (!) 149/59 (!) 146/60  Pulse: 69 67  Resp: 19 15  Temp: 36.7 C 36.7 C  SpO2: 97% 99%    Last Pain:  Vitals:   03/19/17 1357  TempSrc:   PainSc: 0-No pain                 Audry Pili

## 2017-03-20 ENCOUNTER — Encounter (HOSPITAL_COMMUNITY): Payer: Self-pay | Admitting: Orthopedic Surgery

## 2017-03-26 DIAGNOSIS — M25532 Pain in left wrist: Secondary | ICD-10-CM | POA: Diagnosis not present

## 2017-03-27 DIAGNOSIS — D500 Iron deficiency anemia secondary to blood loss (chronic): Secondary | ICD-10-CM | POA: Diagnosis not present

## 2017-03-27 DIAGNOSIS — E871 Hypo-osmolality and hyponatremia: Secondary | ICD-10-CM | POA: Diagnosis not present

## 2017-03-27 DIAGNOSIS — I10 Essential (primary) hypertension: Secondary | ICD-10-CM | POA: Diagnosis not present

## 2017-03-27 DIAGNOSIS — N183 Chronic kidney disease, stage 3 (moderate): Secondary | ICD-10-CM | POA: Diagnosis not present

## 2017-03-27 DIAGNOSIS — E782 Mixed hyperlipidemia: Secondary | ICD-10-CM | POA: Diagnosis not present

## 2017-03-27 DIAGNOSIS — M8580 Other specified disorders of bone density and structure, unspecified site: Secondary | ICD-10-CM | POA: Diagnosis not present

## 2017-04-02 DIAGNOSIS — Z9889 Other specified postprocedural states: Secondary | ICD-10-CM | POA: Diagnosis not present

## 2017-04-10 DIAGNOSIS — E871 Hypo-osmolality and hyponatremia: Secondary | ICD-10-CM | POA: Diagnosis not present

## 2017-04-10 DIAGNOSIS — E782 Mixed hyperlipidemia: Secondary | ICD-10-CM | POA: Diagnosis not present

## 2017-04-10 DIAGNOSIS — N183 Chronic kidney disease, stage 3 (moderate): Secondary | ICD-10-CM | POA: Diagnosis not present

## 2017-04-10 DIAGNOSIS — I10 Essential (primary) hypertension: Secondary | ICD-10-CM | POA: Diagnosis not present

## 2017-04-16 DIAGNOSIS — M25532 Pain in left wrist: Secondary | ICD-10-CM | POA: Diagnosis not present

## 2017-05-01 DIAGNOSIS — E782 Mixed hyperlipidemia: Secondary | ICD-10-CM | POA: Diagnosis not present

## 2017-05-01 DIAGNOSIS — N183 Chronic kidney disease, stage 3 (moderate): Secondary | ICD-10-CM | POA: Diagnosis not present

## 2017-05-01 DIAGNOSIS — E871 Hypo-osmolality and hyponatremia: Secondary | ICD-10-CM | POA: Diagnosis not present

## 2017-05-01 DIAGNOSIS — I10 Essential (primary) hypertension: Secondary | ICD-10-CM | POA: Diagnosis not present

## 2017-05-06 DIAGNOSIS — S52502D Unspecified fracture of the lower end of left radius, subsequent encounter for closed fracture with routine healing: Secondary | ICD-10-CM | POA: Diagnosis not present

## 2017-05-06 DIAGNOSIS — M25632 Stiffness of left wrist, not elsewhere classified: Secondary | ICD-10-CM | POA: Diagnosis not present

## 2017-05-06 DIAGNOSIS — M6281 Muscle weakness (generalized): Secondary | ICD-10-CM | POA: Diagnosis not present

## 2017-05-08 ENCOUNTER — Other Ambulatory Visit: Payer: Self-pay | Admitting: Cardiovascular Disease

## 2017-05-08 DIAGNOSIS — M810 Age-related osteoporosis without current pathological fracture: Secondary | ICD-10-CM | POA: Diagnosis not present

## 2017-05-08 DIAGNOSIS — D649 Anemia, unspecified: Secondary | ICD-10-CM | POA: Diagnosis not present

## 2017-05-08 DIAGNOSIS — N183 Chronic kidney disease, stage 3 (moderate): Secondary | ICD-10-CM | POA: Diagnosis not present

## 2017-05-08 DIAGNOSIS — R911 Solitary pulmonary nodule: Secondary | ICD-10-CM | POA: Diagnosis not present

## 2017-05-08 DIAGNOSIS — E782 Mixed hyperlipidemia: Secondary | ICD-10-CM | POA: Diagnosis not present

## 2017-05-08 NOTE — Telephone Encounter (Signed)
Rx(s) sent to pharmacy electronically.  

## 2017-05-13 DIAGNOSIS — M25632 Stiffness of left wrist, not elsewhere classified: Secondary | ICD-10-CM | POA: Diagnosis not present

## 2017-05-13 DIAGNOSIS — M6281 Muscle weakness (generalized): Secondary | ICD-10-CM | POA: Diagnosis not present

## 2017-05-13 DIAGNOSIS — S52502D Unspecified fracture of the lower end of left radius, subsequent encounter for closed fracture with routine healing: Secondary | ICD-10-CM | POA: Diagnosis not present

## 2017-05-14 DIAGNOSIS — M25632 Stiffness of left wrist, not elsewhere classified: Secondary | ICD-10-CM | POA: Diagnosis not present

## 2017-05-17 DIAGNOSIS — M25632 Stiffness of left wrist, not elsewhere classified: Secondary | ICD-10-CM | POA: Diagnosis not present

## 2017-05-17 DIAGNOSIS — S52502D Unspecified fracture of the lower end of left radius, subsequent encounter for closed fracture with routine healing: Secondary | ICD-10-CM | POA: Diagnosis not present

## 2017-05-17 DIAGNOSIS — M6281 Muscle weakness (generalized): Secondary | ICD-10-CM | POA: Diagnosis not present

## 2017-06-05 DIAGNOSIS — E782 Mixed hyperlipidemia: Secondary | ICD-10-CM | POA: Diagnosis not present

## 2017-06-05 DIAGNOSIS — D649 Anemia, unspecified: Secondary | ICD-10-CM | POA: Diagnosis not present

## 2017-06-05 DIAGNOSIS — D509 Iron deficiency anemia, unspecified: Secondary | ICD-10-CM | POA: Diagnosis not present

## 2017-06-14 ENCOUNTER — Encounter (HOSPITAL_COMMUNITY): Payer: Self-pay

## 2017-06-14 ENCOUNTER — Other Ambulatory Visit: Payer: Self-pay

## 2017-06-14 ENCOUNTER — Observation Stay (HOSPITAL_COMMUNITY)
Admission: EM | Admit: 2017-06-14 | Discharge: 2017-06-15 | Disposition: A | Discharge status: Home / Self Care | Payer: PPO | Attending: Internal Medicine | Admitting: Internal Medicine

## 2017-06-14 DIAGNOSIS — Z8249 Family history of ischemic heart disease and other diseases of the circulatory system: Secondary | ICD-10-CM | POA: Insufficient documentation

## 2017-06-14 DIAGNOSIS — N29 Other disorders of kidney and ureter in diseases classified elsewhere: Secondary | ICD-10-CM | POA: Diagnosis not present

## 2017-06-14 DIAGNOSIS — M109 Gout, unspecified: Secondary | ICD-10-CM | POA: Diagnosis not present

## 2017-06-14 DIAGNOSIS — I2510 Atherosclerotic heart disease of native coronary artery without angina pectoris: Secondary | ICD-10-CM | POA: Diagnosis not present

## 2017-06-14 DIAGNOSIS — Z801 Family history of malignant neoplasm of trachea, bronchus and lung: Secondary | ICD-10-CM | POA: Insufficient documentation

## 2017-06-14 DIAGNOSIS — Z79899 Other long term (current) drug therapy: Secondary | ICD-10-CM | POA: Diagnosis not present

## 2017-06-14 DIAGNOSIS — Z9842 Cataract extraction status, left eye: Secondary | ICD-10-CM | POA: Insufficient documentation

## 2017-06-14 DIAGNOSIS — K219 Gastro-esophageal reflux disease without esophagitis: Secondary | ICD-10-CM | POA: Diagnosis not present

## 2017-06-14 DIAGNOSIS — D500 Iron deficiency anemia secondary to blood loss (chronic): Principal | ICD-10-CM | POA: Insufficient documentation

## 2017-06-14 DIAGNOSIS — I340 Nonrheumatic mitral (valve) insufficiency: Secondary | ICD-10-CM | POA: Insufficient documentation

## 2017-06-14 DIAGNOSIS — Z9841 Cataract extraction status, right eye: Secondary | ICD-10-CM | POA: Insufficient documentation

## 2017-06-14 DIAGNOSIS — N289 Disorder of kidney and ureter, unspecified: Secondary | ICD-10-CM

## 2017-06-14 DIAGNOSIS — N189 Chronic kidney disease, unspecified: Secondary | ICD-10-CM | POA: Insufficient documentation

## 2017-06-14 DIAGNOSIS — Z87442 Personal history of urinary calculi: Secondary | ICD-10-CM | POA: Insufficient documentation

## 2017-06-14 DIAGNOSIS — R5383 Other fatigue: Secondary | ICD-10-CM | POA: Diagnosis not present

## 2017-06-14 DIAGNOSIS — D649 Anemia, unspecified: Secondary | ICD-10-CM | POA: Diagnosis present

## 2017-06-14 DIAGNOSIS — R195 Other fecal abnormalities: Secondary | ICD-10-CM

## 2017-06-14 DIAGNOSIS — Z8774 Personal history of (corrected) congenital malformations of heart and circulatory system: Secondary | ICD-10-CM

## 2017-06-14 DIAGNOSIS — Z8262 Family history of osteoporosis: Secondary | ICD-10-CM | POA: Diagnosis not present

## 2017-06-14 DIAGNOSIS — R531 Weakness: Secondary | ICD-10-CM

## 2017-06-14 DIAGNOSIS — N400 Enlarged prostate without lower urinary tract symptoms: Secondary | ICD-10-CM | POA: Insufficient documentation

## 2017-06-14 DIAGNOSIS — R011 Cardiac murmur, unspecified: Secondary | ICD-10-CM | POA: Diagnosis not present

## 2017-06-14 DIAGNOSIS — Z87891 Personal history of nicotine dependence: Secondary | ICD-10-CM | POA: Insufficient documentation

## 2017-06-14 DIAGNOSIS — E871 Hypo-osmolality and hyponatremia: Secondary | ICD-10-CM | POA: Diagnosis not present

## 2017-06-14 DIAGNOSIS — Z809 Family history of malignant neoplasm, unspecified: Secondary | ICD-10-CM | POA: Diagnosis not present

## 2017-06-14 DIAGNOSIS — K922 Gastrointestinal hemorrhage, unspecified: Secondary | ICD-10-CM | POA: Diagnosis present

## 2017-06-14 DIAGNOSIS — Z96653 Presence of artificial knee joint, bilateral: Secondary | ICD-10-CM | POA: Insufficient documentation

## 2017-06-14 DIAGNOSIS — I129 Hypertensive chronic kidney disease with stage 1 through stage 4 chronic kidney disease, or unspecified chronic kidney disease: Secondary | ICD-10-CM | POA: Insufficient documentation

## 2017-06-14 DIAGNOSIS — R911 Solitary pulmonary nodule: Secondary | ICD-10-CM | POA: Diagnosis not present

## 2017-06-14 LAB — URINALYSIS, ROUTINE W REFLEX MICROSCOPIC
Bilirubin Urine: NEGATIVE
GLUCOSE, UA: NEGATIVE mg/dL
Hgb urine dipstick: NEGATIVE
KETONES UR: NEGATIVE mg/dL
LEUKOCYTES UA: NEGATIVE
Nitrite: NEGATIVE
PH: 6 (ref 5.0–8.0)
Protein, ur: NEGATIVE mg/dL
Specific Gravity, Urine: 1.012 (ref 1.005–1.030)

## 2017-06-14 LAB — BASIC METABOLIC PANEL
Anion gap: 9 (ref 5–15)
BUN: 52 mg/dL — AB (ref 6–20)
CHLORIDE: 107 mmol/L (ref 101–111)
CO2: 22 mmol/L (ref 22–32)
Calcium: 9.5 mg/dL (ref 8.9–10.3)
Creatinine, Ser: 1.88 mg/dL — ABNORMAL HIGH (ref 0.61–1.24)
GFR calc Af Amer: 35 mL/min — ABNORMAL LOW (ref >60)
GFR calc non Af Amer: 30 mL/min — ABNORMAL LOW (ref >60)
Glucose, Bld: 121 mg/dL — ABNORMAL HIGH (ref 65–99)
POTASSIUM: 4.9 mmol/L (ref 3.5–5.1)
Sodium: 138 mmol/L (ref 135–145)

## 2017-06-14 LAB — CBC
HEMATOCRIT: 23 % — AB (ref 39.0–52.0)
Hemoglobin: 7.2 g/dL — ABNORMAL LOW (ref 13.0–17.0)
MCH: 28.7 pg (ref 26.0–34.0)
MCHC: 31.3 g/dL (ref 30.0–36.0)
MCV: 91.6 fL (ref 78.0–100.0)
Platelets: 182 10*3/uL (ref 150–400)
RBC: 2.51 MIL/uL — ABNORMAL LOW (ref 4.22–5.81)
RDW: 14.4 % (ref 11.5–15.5)
WBC: 7.8 10*3/uL (ref 4.0–10.5)

## 2017-06-14 LAB — CBG MONITORING, ED: Glucose-Capillary: 114 mg/dL — ABNORMAL HIGH (ref 65–99)

## 2017-06-14 LAB — POC OCCULT BLOOD, ED: Fecal Occult Bld: POSITIVE — AB

## 2017-06-14 LAB — PREPARE RBC (CROSSMATCH)

## 2017-06-14 MED ORDER — ATORVASTATIN CALCIUM 10 MG PO TABS
10.0000 mg | ORAL_TABLET | Freq: Every day | ORAL | Status: DC
  Administered 2017-06-14: 10 mg via ORAL
  Filled 2017-06-14: qty 1

## 2017-06-14 MED ORDER — SODIUM CHLORIDE 0.9 % IV SOLN
10.0000 mL/h | Freq: Once | INTRAVENOUS | Status: AC
  Administered 2017-06-14: 10 mL/h via INTRAVENOUS

## 2017-06-14 MED ORDER — HYDRALAZINE HCL 20 MG/ML IJ SOLN: 10.0000 mg | Freq: Four times a day (QID) | INTRAMUSCULAR | Status: DC | PRN

## 2017-06-14 MED ORDER — DILTIAZEM HCL ER COATED BEADS 300 MG PO CP24
300.0000 mg | ORAL_CAPSULE | Freq: Every day | ORAL | Status: DC
  Administered 2017-06-15: 300 mg via ORAL
  Filled 2017-06-14: qty 1

## 2017-06-14 MED ORDER — SODIUM CHLORIDE 0.9 % IV SOLN
INTRAVENOUS | Status: DC
  Administered 2017-06-15: 01:00:00 via INTRAVENOUS

## 2017-06-14 NOTE — H&P (Addendum)
History and Physical    SACRAMENTO MONDS NWG:[id number] DOB: 10-18-1925 DOA: 06/14/2017  PCP: [name], MD Patient coming from: Home  Chief Complaint: Anemia  HPI: [name] is a 84 y.o. male with medical history significant of chronic anemia, AV malformations, CKD, COPD, CAD admitted with generalized weakness dyspnea on exertion and shortness of breath.  He reports getting blood transfusion a year ago.  He denies any hematemesis or hematuria or black stools.  However he tested FOBT positive in the ER.  Patient was sent to the hospital from the PCPs office but due to hemoglobin of 6.9.  He denies any abdominal pain or chest pain or syncope.  Denies urinary complaints.  Denies cough fever chills headaches.  He does have a good appetite and there is no weight loss.  Studies obtained from the patient his wife is by the bedside.  Patient takes baby aspirin daily.  Denies taking Advil or  Motrin.    ED Course: His vital signs in the ER was 1 59/68 71.20 and 98% on room air.  Temperature 97.6 ED physician spoke to Dr. Benson Norway GI who will see the patient in the hospital.  Blood transfusion for 2 units have been arranged in the ER.  His sodium was 138 potassium 4.9 BUN 52 creatinine 1.88 white count 7.8 hemoglobin 7.2 platelet count 182.  FOBT positive, EKG sinus rhythm prolonged PR interval.  Review of Systems: Positive for generalized weakness, shortness of breath.  Ambulatory Status: Patient lives at home and ambulates without any difficulty.  Past Medical History:  Diagnosis Date  . Anemia    low hemoglobin  . Arthritis   . AV (angiodysplasia malformation of colon)   . Benign prostatic hypertrophy   . Cancer ([place])    basal cell cancer  . Chronic kidney disease    elevated Cr, BUN, Dr. Hassell Done manages  . COPD (chronic obstructive pulmonary disease) ([place])   . Coronary artery disease   . GERD (gastroesophageal reflux disease)   . Gout   . Heart murmur   . History of kidney  stones   . Hyperlipidemia   . Hypertension   . Influenza    February 2019  . Mitral regurgitation   . Mitral valve prolapse   . Pneumonia 1970's  . PONV (postoperative nausea and vomiting)    1st knee replacement  . Shortness of breath     Past Surgical History:  Procedure Laterality Date  . ABDOMINAL ANGIOGRAM  04/30/2011   Procedure: ABDOMINAL ANGIOGRAM;  Surgeon: Lorretta Harp, MD;  Location[facility] CATH LAB;  Service: Cardiovascular;;  . CARDIAC CATHETERIZATION  04/2011   x2  . CARDIAC CATHETERIZATION  08/2009  . CATARACT EXTRACTION  2011   bilateral  . COLONOSCOPY  05/04/2011   Procedure: COLONOSCOPY;  Surgeon: Beryle Beams, MD;  Location: [facility] ENDOSCOPY;  Service: Endoscopy;  Laterality: N/A;  . COLONOSCOPY N/A 07/16/2013   Procedure: COLONOSCOPY;  Surgeon: Beryle Beams, MD;  Location: [place];  Service: Endoscopy;  Laterality: N/A;  . CORONARY ANGIOGRAM  04/30/2011   Procedure: CORONARY ANGIOGRAM;  Surgeon: Lorretta Harp, MD;  Location: [facility] CATH LAB;  Service: Cardiovascular;;  . ESOPHAGOGASTRODUODENOSCOPY  05/04/2011   Procedure: ESOPHAGOGASTRODUODENOSCOPY (EGD);  Surgeon: Beryle Beams, MD;  Location: [facility] ENDOSCOPY;  Service: Endoscopy;  Laterality: N/A;  . EYE SURGERY     bilateral cataracts removed  . HERNIA REPAIR  1999&2000   bilateral  . LOBECTOMY  1980   left lower  lobectomy for benign tumor  . OPEN REDUCTION INTERNAL FIXATION (ORIF) DISTAL RADIAL FRACTURE Left 03/19/2017   Procedure: OPEN REDUCTION INTERNAL FIXATION (ORIF) DISTAL RADIAL FRACTURE;  Surgeon: Dorna Leitz, MD;  Location: [place];  Service: Orthopedics;  Laterality: Left;  AXILLARY BLOCK  . REPLACEMENT TOTAL KNEE  2005   right  . RIGHT HEART CATHETERIZATION  04/30/2011   Procedure: RIGHT HEART CATH;  Surgeon: Lorretta Harp, MD;  Location: [facility] CATH LAB;  Service: Cardiovascular;;  . TEE WITHOUT CARDIOVERSION  04/30/2011   Procedure: TRANSESOPHAGEAL ECHOCARDIOGRAM (TEE);  Surgeon: Sanda Klein,  MD;  Location: [facility] ENDOSCOPY;  Service: Cardiovascular;  Laterality: N/A;  3D TEE/ patient will be admitted the day before test , therefo patient will be a inpatient the day of TEE  . TOTAL KNEE ARTHROPLASTY Left 02/15/2012   Procedure: TOTAL KNEE ARTHROPLASTY;  Surgeon: Alta Corning, MD;  Location: [facility];  Service: Orthopedics;  Laterality: Left;    Social History   Socioeconomic History  . Marital status: Married    Spouse name: Not on file  . Number of children: Not on file  . Years of education: Not on file  . Highest education level: Not on file  Occupational History  . Not on file  Social Needs  . Financial resource strain: Not on file  . Food insecurity:    Worry: Not on file    Inability: Not on file  . Transportation needs:    Medical: Not on file    Non-medical: Not on file  Tobacco Use  . Smoking status: Former Smoker    Years: 20.00    Last attempt to quit: 02/08/1961    Years since quitting: 56.3  . Smokeless tobacco: Former Systems developer    Quit date: 02/08/1961  Substance and Sexual Activity  . Alcohol use: Yes    Alcohol/week: 4.2 oz    Types: 7 Standard drinks or equivalent per week    Comment: 2ozs Building services engineer daily  . Drug use: No  . Sexual activity: Not Currently  Lifestyle  . Physical activity:    Days per week: Not on file    Minutes per session: Not on file  . Stress: Not on file  Relationships  . Social connections:    Talks on phone: Not on file    Gets together: Not on file    Attends religious service: Not on file    Active member of club or organization: Not on file    Attends meetings of clubs or organizations: Not on file    Relationship status: Not on file  . Intimate partner violence:    Fear of current or ex partner: Not on file    Emotionally abused: Not on file    Physically abused: Not on file    Forced sexual activity: Not on file  Other Topics Concern  . Not on file  Social History Narrative  . Not on file    No Known Allergies  Family  History  Problem Relation Age of Onset  . Hypertension Mother   . Osteoporosis Mother   . Lung cancer Father   . Irregular heart beat Brother   . Heart attack Maternal Grandfather   . Cancer Paternal Grandfather       Prior to Admission medications   Medication Sig Start Date End Date Taking? Authorizing Provider  acetaminophen (TYLENOL) 325 MG tablet Take 650 mg by mouth every 6 (six) hours as needed for mild pain or headache.   Yes  [provider]  allopurinol (ZYLOPRIM) 300 MG tablet Take 150 mg by mouth daily.    Yes [provider]  aspirin EC 81 MG tablet Take 81 mg by mouth daily.   Yes [provider]  atorvastatin (LIPITOR) 10 MG tablet Take 1 tablet (10 mg total) by mouth daily at 6 PM. <PLEASE MAKE APPOINTMENT FOR REFILLS> 05/08/17  Yes Lorretta Harp, MD  calcium-vitamin D (OSCAL WITH D) 500-200 MG-UNIT tablet Take 1 tablet by mouth every evening.   Yes [provider]  diclofenac sodium (VOLTAREN) 1 % GEL APPLY 1 GRAM UP TO 4 TIMES A DAY AS NEEDED FOR PAIN 06/07/17  Yes [provider]  diltiazem (CARDIZEM CD) 300 MG 24 hr capsule Take 300 mg by mouth daily.   Yes [provider]  lisinopril (PRINIVIL,ZESTRIL) 40 MG tablet Take 40 mg by mouth daily.   Yes [provider]  omeprazole (PRILOSEC OTC) 20 MG tablet Take 20 mg by mouth every other day.   Yes [provider]  polyethylene glycol (MIRALAX / GLYCOLAX) packet Take 17 g by mouth daily as needed for moderate constipation.    Yes [provider]  traMADol (ULTRAM) 50 MG tablet Take 1-2 tablets (50-100 mg total) by mouth every 6 (six) hours as needed for moderate pain. Patient not taking: Reported on 06/14/2017 03/19/17 03/19/18  Gary Fleet, PA-C    Physical Exam: Vitals:   06/14/17 1201 06/14/17 1209 06/14/17 1516  BP: (!) 178/64  (!) 159/68  Pulse: 73  71  Resp:   20  Temp: 97.6 F (36.4 C)    TempSrc: Oral    SpO2: 99%  98%  Weight:   78 kg (172 lb)   Height:  6' (1.829 m)      General:  Appears calm and comfortable Eyes: PERRL, EOMI, normal lids, PALE CONJUCTIVA ENT:  grossly normal hearing, lips & tongue, mmm Neck: no LAD, masses or thyromegaly Cardiovascular:  RRR, no m/r/g. No LE edema.  Respiratory:  CTA bilaterally, no w/r/r. Normal respiratory effort. Abdomen: soft, ntnd, NABS Skin:  no rash or induration seen on limited exam Musculoskeletal:  grossly normal tone BUE/BLE, good ROM, no bony abnormality Psychiatric:  grossly normal mood and affect, speech fluent and appropriate, AOx3 Neurologic:  CN 2-12 grossly intact, moves all extremities in coordinated fashion, sensation intact  Labs on Admission: I have personally reviewed following labs and imaging studies  CBC: Recent Labs  Lab 06/14/17 1241  WBC 7.8  HGB 7.2*  HCT 23.0*  MCV 91.6  PLT 295   Basic Metabolic Panel: Recent Labs  Lab 06/14/17 1241  NA 138  K 4.9  CL 107  CO2 22  GLUCOSE 121*  BUN 52*  CREATININE 1.88*  CALCIUM 9.5   GFR: Estimated Creatinine Clearance: 28.7 mL/min (A) (by C-G formula based on SCr of 1.88 mg/dL (H)). Liver Function Tests: No results for input(s): AST, ALT, ALKPHOS, BILITOT, PROT, ALBUMIN in the last 168 hours. No results for input(s): LIPASE, AMYLASE in the last 168 hours. No results for input(s): AMMONIA in the last 168 hours. Coagulation Profile: No results for input(s): INR, PROTIME in the last 168 hours. Cardiac Enzymes: No results for input(s): CKTOTAL, CKMB, CKMBINDEX, TROPONINI in the last 168 hours. BNP (last 3 results) No results for input(s): PROBNP in the last 8760 hours. HbA1C: No results for input(s): HGBA1C in the last 72 hours. CBG: Recent Labs  Lab 06/14/17 1247  GLUCAP 114*   Lipid Profile:  No results for input(s): CHOL, HDL, LDLCALC, TRIG, CHOLHDL, LDLDIRECT in the last 72 hours. Thyroid Function Tests: No results for input(s): TSH, T4TOTAL, FREET4, T3FREE, THYROIDAB in  the last 72 hours. Anemia Panel: No results for input(s): VITAMINB12, FOLATE, FERRITIN, TIBC, IRON, RETICCTPCT in the last 72 hours. Urine analysis:    Component Value Date/Time   COLORURINE STRAW (A) 06/14/2017 1210   APPEARANCEUR CLEAR 06/14/2017 1210   LABSPEC 1.012 06/14/2017 1210   PHURINE 6.0 06/14/2017 1210   GLUCOSEU NEGATIVE 06/14/2017 1210   HGBUR NEGATIVE 06/14/2017 1210   BILIRUBINUR NEGATIVE 06/14/2017 1210   KETONESUR NEGATIVE 06/14/2017 1210   PROTEINUR NEGATIVE 06/14/2017 1210   UROBILINOGEN 0.2 06/30/2013 2315   NITRITE NEGATIVE 06/14/2017 1210   LEUKOCYTESUR NEGATIVE 06/14/2017 1210    Creatinine Clearance: Estimated Creatinine Clearance: 28.7 mL/min (A) (by C-G formula based on SCr of 1.88 mg/dL (H)).  Sepsis Labs: @LABRCNTIP (procalcitonin:4,lacticidven:4) )No results found for this or any previous visit (from the past 240 hour(s)).   Radiological Exams on Admission: No results found.  EKG: No acute changes prolonged PR interval sinus rhythm  Assessment/Plan Active Problems:   * No active hospital problems. *   1] acute on chronic anemia FOBT positive in a patient with history of AV malformations patient presented with complaints of generalized weakness and shortness of breath with a hemoglobin of 6.9 as an outpatient in the PCP office.  Repeat hemoglobin in the ER was 7.2.  2 units of blood transfusion has been arranged in the emergency room.  Patient remains hemodynamically stable.  He denies any nausea vomiting hematemesis hematuria hematochezia.  Dr. Benson Norway has been contacted by the ED physician who will see the patient in the hospital.  Hold baby aspirin.  Keep the patient n.p.o. after midnight in case of a GI procedure.  2] hypertension restart  Cardizem.  Hold lisinopril for now.  3] history of CAD on aspirin will hold for now.  4] history of gout on allopurinol.  5] hyperlipidemia continue Lipitor.  6] AKI on CKD stage III creatinine is 1.8 on  admission secondary to dehydration hopefully it will improve with blood transfusion and slow IV hydration.  Recheck levels tomorrow.  Hold lisinopril for now.  Restart upon discharge.    DVT prophylaxis: SCD Code Status: DNR Family Communication: Wife in the room Disposition Plan: TBD Consults called: ED physician spoke to Dr. Almyra Free with GI Admission status: Inpatient   Georgette Shell MD [facility]  If 7PM-7AM, please contact night-coverage [email or web address] Password [facility]  06/14/2017, 4:58 PM

## 2017-06-14 NOTE — ED Triage Notes (Signed)
Pt reports that he had a Hgb 8.6 a week ago. Pt reports that he felt very weak and went to his PCP today and Hgb recheck showed 6.9. Pt denies any bloody stools, abnormal bleeding or bruising. Pt reports that he takes a Baby ASA daily.

## 2017-06-14 NOTE — ED Notes (Signed)
ED TO INPATIENT HANDOFF REPORT  Name/Age/Gender [name] 84 y.o. male  Code Status    Code Status Orders  (From admission, onward)        Start     Ordered   06/14/17 1710  Do not attempt resuscitation (DNR)  Continuous    Question Answer Comment  In the event of cardiac or respiratory ARREST Do not call a "code blue"   In the event of cardiac or respiratory ARREST Do not perform Intubation, CPR, defibrillation or ACLS   In the event of cardiac or respiratory ARREST Use medication by any route, position, wound care, and other measures to relive pain and suffering. May use oxygen, suction and manual treatment of airway obstruction as needed for comfort.      06/14/17 1709    Code Status History    Date Active Date Inactive Code Status Order ID Comments User Context   02/19/2017 0807 02/24/2017 1422 DNR [id number]  Damita Lack, MD ED   06/30/2013 2147 07/01/2013 1410 Full Code [id number]  Barton Dubois, MD Inpatient   02/15/2012 1512 02/18/2012 2008 Full Code [id number]  Erlene Senters, PA Inpatient    Advance Directive Documentation     Most Recent Value  Type of Advance Directive  Healthcare Power of Attorney, Living will  Pre-existing out of facility DNR order (yellow form or pink MOST form)  -  "MOST" Form in Place?  -      Home/SNF/Other Home  Chief Complaint low hemoglobin  Level of Care/Admitting Diagnosis ED Disposition    ED Disposition Condition [facility] Area: [place] [[id number]]  Level of Care: Telemetry [5]  Admit to tele based on following criteria: Other see comments  Comments: anemia  Diagnosis: GI bleed [035009]  Admitting Physician: Georgette Shell [[id number]]  Attending Physician: Georgette Shell [[id number]]  Estimated length of stay: 3 - 4 days  Certification:: I certify this patient will need inpatient services for at least 2 midnights  PT Class (Do Not Modify): Inpatient [101]  PT Acc Code  (Do Not Modify): Private [1]       Medical History Past Medical History:  Diagnosis Date  . Anemia    low hemoglobin  . Arthritis   . AV (angiodysplasia malformation of colon)   . Benign prostatic hypertrophy   . Cancer ([place])    basal cell cancer  . Chronic kidney disease    elevated Cr, BUN, Dr. Hassell Done manages  . COPD (chronic obstructive pulmonary disease) ([place])   . Coronary artery disease   . GERD (gastroesophageal reflux disease)   . Gout   . Heart murmur   . History of kidney stones   . Hyperlipidemia   . Hypertension   . Influenza    February 2019  . Mitral regurgitation   . Mitral valve prolapse   . Pneumonia 1970's  . PONV (postoperative nausea and vomiting)    1st knee replacement  . Shortness of breath     Allergies No Known Allergies  IV Location/Drains/Wounds Patient Lines/Drains/Airways Status   Active Line/Drains/Airways    Name:   Placement date:   Placement time:   Site:   Days:   Peripheral IV 06/14/17 Left Forearm   06/14/17    1338    Forearm   less than 1   External Urinary Catheter   02/20/17    2047    -   114   Incision (Closed) 03/19/17 Arm  Left   03/19/17    1312     87          Labs/Imaging Results for orders placed or performed during the hospital encounter of 06/14/17 (from the past 48 hour(s))  Urinalysis, Routine w reflex microscopic     Status: Abnormal   Collection Time: 06/14/17 12:10 PM  Result Value Ref Range   Color, Urine STRAW (A) YELLOW   APPearance CLEAR CLEAR   Specific Gravity, Urine 1.012 1.005 - 1.030   pH 6.0 5.0 - 8.0   Glucose, UA NEGATIVE NEGATIVE mg/dL   Hgb urine dipstick NEGATIVE NEGATIVE   Bilirubin Urine NEGATIVE NEGATIVE   Ketones, ur NEGATIVE NEGATIVE mg/dL   Protein, ur NEGATIVE NEGATIVE mg/dL   Nitrite NEGATIVE NEGATIVE   Leukocytes, UA NEGATIVE NEGATIVE    Comment: Performed at [facility] [address] [place], [place] [address]  Basic metabolic panel     Status: Abnormal    Collection Time: 06/14/17 12:41 PM  Result Value Ref Range   Sodium 138 135 - 145 mmol/L   Potassium 4.9 3.5 - 5.1 mmol/L   Chloride 107 101 - 111 mmol/L   CO2 22 22 - 32 mmol/L   Glucose, Bld 121 (H) 65 - 99 mg/dL   BUN 52 (H) 6 - 20 mg/dL   Creatinine, Ser 1.88 (H) 0.61 - 1.24 mg/dL   Calcium 9.5 8.9 - 10.3 mg/dL   GFR calc non Af Amer 30 (L) >60 mL/min   GFR calc Af Amer 35 (L) >60 mL/min    Comment: (NOTE) The eGFR has been calculated using the CKD EPI equation. This calculation has not been validated in all clinical situations. eGFR's persistently <60 mL/min signify possible Chronic Kidney Disease.    Anion gap 9 5 - 15    Comment: Performed at [facility], [place] [address] [place], [place] [address]  CBC     Status: Abnormal   Collection Time: 06/14/17 12:41 PM  Result Value Ref Range   WBC 7.8 4.0 - 10.5 K/uL   RBC 2.51 (L) 4.22 - 5.81 MIL/uL   Hemoglobin 7.2 (L) 13.0 - 17.0 g/dL   HCT 23.0 (L) 39.0 - 52.0 %   MCV 91.6 78.0 - 100.0 fL   MCH 28.7 26.0 - 34.0 pg   MCHC 31.3 30.0 - 36.0 g/dL   RDW 14.4 11.5 - 15.5 %   Platelets 182 150 - 400 K/uL    Comment: Performed at [facility], [place] [address], [place], [place] [address]  CBG monitoring, ED     Status: Abnormal   Collection Time: 06/14/17 12:47 PM  Result Value Ref Range   Glucose-Capillary 114 (H) 65 - 99 mg/dL  POC occult blood, ED Provider will collect     Status: Abnormal   Collection Time: 06/14/17  1:12 PM  Result Value Ref Range   Fecal Occult Bld POSITIVE (A) NEGATIVE  Type and screen     Status: None (Preliminary result)   Collection Time: 06/14/17  1:38 PM  Result Value Ref Range   ABO/RH(D) O NEG    Antibody Screen NEG    Sample Expiration 06/17/2017    Unit Number [id number]    Blood Component Type RED CELLS,LR    Unit division 00    Status of Unit ISSUED    Transfusion Status OK TO TRANSFUSE    Crossmatch Result      Compatible Performed at [facility],  [place] [address], [place], [place] [address]    Unit Number [id number]    Blood Component Type RED CELLS,LR    Unit division 00    Status of Unit ALLOCATED    Transfusion Status OK TO TRANSFUSE    Crossmatch Result Compatible   Prepare RBC     Status: None   Collection Time: 06/14/17  3:37 PM  Result Value Ref Range   Order Confirmation      ORDER PROCESSED BY BLOOD BANK Performed at [facility], [place] [address] [place], [place] [address]    No results found.  Pending Labs Unresulted Labs (From admission, onward)   Start     Ordered   06/15/17 0500  CBC  Daily,   R     06/14/17 1718   06/15/17 7357  Basic metabolic panel  Daily,   R     06/14/17 1718      Vitals/Pain Today's Vitals   06/14/17 1645 06/14/17 1700 06/14/17 1756 06/14/17 1824  BP:  (!) 173/53 (!) 166/58 (!) 169/63  Pulse: 66 72 70 70  Resp: (!) _0 Temp:   98.6 F (37 C) 98.2 F (36.8 C)  TempSrc:   Oral Oral  SpO2: 97% 99% 100% 98%  Weight:      Height:      PainSc:    0-No pain    Isolation Precautions No active isolations  Medications Medications  0.9 %  sodium chloride infusion (has no administration in time range)  atorvastatin (LIPITOR) tablet 10 mg (has no administration in time range)  diltiazem (CARDIZEM CD) 24 hr capsule 300 mg (has no administration in time range)  0.9 %  sodium chloride infusion (10 mL/hr Intravenous New Bag/Given 06/14/17 1712)    Mobility Walks with device

## 2017-06-14 NOTE — ED Notes (Signed)
Per PCP-states severe anemia, Hgb 6.9-states weak

## 2017-06-14 NOTE — ED Provider Notes (Addendum)
[place] DEPT Provider Note   CSN: [id number] Arrival date & time: 06/14/17  1156     History   Chief Complaint Chief Complaint  Patient presents with  . abnormal hgb  . Weakness    HPI [name] is a 84 y.o. male.  Patient w hx avms, anemia, c/o feeling fatigued and generally weak for the past couple days. Symptoms gradual onset, persistent, moderate, worse when standing in shower last night. Denies melena or rectal bleeding. No abd pain. No syncope. Was at pcp today and told hgb decreased to 6.9. Hx avms on prior colonoscopies.   The history is provided by the patient.  Weakness  Pertinent negatives include no shortness of breath, no chest pain, no vomiting, no confusion and no headaches.    Past Medical History:  Diagnosis Date  . Anemia    low hemoglobin  . Arthritis   . AV (angiodysplasia malformation of colon)   . Benign prostatic hypertrophy   . Cancer ([place])    basal cell cancer  . Chronic kidney disease    elevated Cr, BUN, Dr. Hassell Done manages  . COPD (chronic obstructive pulmonary disease) ([place])   . Coronary artery disease   . GERD (gastroesophageal reflux disease)   . Gout   . Heart murmur   . History of kidney stones   . Hyperlipidemia   . Hypertension   . Influenza    February 2019  . Mitral regurgitation   . Mitral valve prolapse   . Pneumonia 1970's  . PONV (postoperative nausea and vomiting)    1st knee replacement  . Shortness of breath     Patient Active Problem List   Diagnosis Date Noted  . Displaced fracture of distal end of left radius 03/19/2017  . Hyponatremia 02/19/2017  . GIB (gastrointestinal bleeding) 06/30/2013  . Postoperative anemia due to acute blood loss 02/18/2012  . Osteoarthritis of left knee 02/15/2012  . Hyperlipidemia   . Anemia, recurrent Hx of colonic AVMs 12/11, transfused, heme stool neg. 05/01/2011  . Dyspnea 04/25/2011  . Mitral regurgitation, severe 04/25/2011  . CAD,  single vessel RCA 04/25/2011  . HTN (hypertension) 04/25/2011  . Dyslipidemia 04/25/2011  . Chronic renal insufficiency, stage III (moderate) ([place]) 04/25/2011  . Gout 04/25/2011    Past Surgical History:  Procedure Laterality Date  . ABDOMINAL ANGIOGRAM  04/30/2011   Procedure: ABDOMINAL ANGIOGRAM;  Surgeon: Lorretta Harp, MD;  Location: [facility] CATH LAB;  Service: Cardiovascular;;  . CARDIAC CATHETERIZATION  04/2011   x2  . CARDIAC CATHETERIZATION  08/2009  . CATARACT EXTRACTION  2011   bilateral  . COLONOSCOPY  05/04/2011   Procedure: COLONOSCOPY;  Surgeon: Beryle Beams, MD;  Location: [facility] ENDOSCOPY;  Service: Endoscopy;  Laterality: N/A;  . COLONOSCOPY N/A 07/16/2013   Procedure: COLONOSCOPY;  Surgeon: Beryle Beams, MD;  Location: [place];  Service: Endoscopy;  Laterality: N/A;  . CORONARY ANGIOGRAM  04/30/2011   Procedure: CORONARY ANGIOGRAM;  Surgeon: Lorretta Harp, MD;  Location: [facility] CATH LAB;  Service: Cardiovascular;;  . ESOPHAGOGASTRODUODENOSCOPY  05/04/2011   Procedure: ESOPHAGOGASTRODUODENOSCOPY (EGD);  Surgeon: Beryle Beams, MD;  Location: [facility] ENDOSCOPY;  Service: Endoscopy;  Laterality: N/A;  . EYE SURGERY     bilateral cataracts removed  . HERNIA REPAIR  1999&2000   bilateral  . LOBECTOMY  1980   left lower lobectomy for benign tumor  . OPEN REDUCTION INTERNAL FIXATION (ORIF) DISTAL RADIAL FRACTURE Left 03/19/2017   Procedure: OPEN  REDUCTION INTERNAL FIXATION (ORIF) DISTAL RADIAL FRACTURE;  Surgeon: Dorna Leitz, MD;  Location: [place];  Service: Orthopedics;  Laterality: Left;  AXILLARY BLOCK  . REPLACEMENT TOTAL KNEE  2005   right  . RIGHT HEART CATHETERIZATION  04/30/2011   Procedure: RIGHT HEART CATH;  Surgeon: Lorretta Harp, MD;  Location: [facility] CATH LAB;  Service: Cardiovascular;;  . TEE WITHOUT CARDIOVERSION  04/30/2011   Procedure: TRANSESOPHAGEAL ECHOCARDIOGRAM (TEE);  Surgeon: Sanda Klein, MD;  Location: [facility] ENDOSCOPY;  Service: Cardiovascular;  Laterality:  N/A;  3D TEE/ patient will be admitted the day before test , therefo patient will be a inpatient the day of TEE  . TOTAL KNEE ARTHROPLASTY Left 02/15/2012   Procedure: TOTAL KNEE ARTHROPLASTY;  Surgeon: Alta Corning, MD;  Location: [place];  Service: Orthopedics;  Laterality: Left;        Home Medications    Prior to Admission medications   Medication Sig Start Date End Date Taking? Authorizing Provider  acetaminophen (TYLENOL) 325 MG tablet Take 650 mg by mouth every 6 (six) hours as needed for mild pain or headache.    [provider]  allopurinol (ZYLOPRIM) 300 MG tablet Take 150 mg by mouth daily.     [provider]  aspirin EC 81 MG tablet Take 81 mg by mouth daily.    [provider]  atorvastatin (LIPITOR) 10 MG tablet Take 1 tablet (10 mg total) by mouth daily at 6 PM. <PLEASE MAKE APPOINTMENT FOR REFILLS> 05/08/17   Lorretta Harp, MD  diltiazem (CARDIZEM CD) 300 MG 24 hr capsule Take 300 mg by mouth daily.    [provider]  lisinopril (PRINIVIL,ZESTRIL) 40 MG tablet Take 40 mg by mouth daily.    [provider]  omeprazole (PRILOSEC OTC) 20 MG tablet Take 20 mg by mouth every other day.    [provider]  polyethylene glycol (MIRALAX / GLYCOLAX) packet Take 17 g by mouth daily.    [provider]  traMADol (ULTRAM) 50 MG tablet Take 1-2 tablets (50-100 mg total) by mouth every 6 (six) hours as needed for moderate pain. 03/19/17 03/19/18  Gary Fleet, PA-C    Family History Family History  Problem Relation Age of Onset  . Hypertension Mother   . Osteoporosis Mother   . Lung cancer Father   . Irregular heart beat Brother   . Heart attack Maternal Grandfather   . Cancer Paternal Grandfather     Social History Social History   Tobacco Use  . Smoking status: Former Smoker    Years: 20.00    Last attempt to quit: 02/08/1961    Years since quitting: 56.3  . Smokeless tobacco: Former Systems developer    Quit date:  02/08/1961  Substance Use Topics  . Alcohol use: Yes    Alcohol/week: 4.2 oz    Types: 7 Standard drinks or equivalent per week    Comment: 2ozs Building services engineer daily  . Drug use: No     Allergies   Patient has no known allergies.   Review of Systems Review of Systems  Constitutional: Positive for fatigue. Negative for fever.  HENT: Negative for sore throat.   Eyes: Negative for redness.  Respiratory: Negative for shortness of breath.   Cardiovascular: Negative for chest pain.  Gastrointestinal: Negative for abdominal pain and vomiting.  Genitourinary: Negative for dysuria and flank pain.  Musculoskeletal: Negative for back pain and neck pain.  Skin: Negative for rash.  Neurological: Positive for weakness. Negative for  headaches.  Hematological: Does not bruise/bleed easily.  Psychiatric/Behavioral: Negative for confusion.     Physical Exam Updated Vital Signs BP (!) 178/64 (BP Location: Right Arm)   Pulse 73   Temp 97.6 F (36.4 C) (Oral)   Ht 1.829 m (6')   Wt 78 kg (172 lb)   SpO2 99%   BMI 23.33 kg/m   Physical Exam  Constitutional: He is oriented to person, place, and time. He appears well-developed and well-nourished. No distress.  HENT:  Mouth/Throat: Oropharynx is clear and moist.  Eyes: Conjunctivae are normal.  Neck: Neck supple. No tracheal deviation present.  Cardiovascular: Normal rate, regular rhythm, normal heart sounds and intact distal pulses.  Pulmonary/Chest: Effort normal and breath sounds normal. No accessory muscle usage. No respiratory distress.  Abdominal: Soft. Bowel sounds are normal. He exhibits no distension. There is no tenderness.  Genitourinary:  Genitourinary Comments: No cva tenderness. Rectal brown stool.   Musculoskeletal: He exhibits no edema.  Neurological: He is alert and oriented to person, place, and time.  Skin: Skin is warm and dry. He is not diaphoretic. There is pallor.  Psychiatric: He has a normal mood and affect.  Nursing  note and vitals reviewed.    ED Treatments / Results  Labs (all labs ordered are listed, but only abnormal results are displayed) Results for orders placed or performed during the hospital encounter of 38/75/64  Basic metabolic panel  Result Value Ref Range   Sodium 138 135 - 145 mmol/L   Potassium 4.9 3.5 - 5.1 mmol/L   Chloride 107 101 - 111 mmol/L   CO2 22 22 - 32 mmol/L   Glucose, Bld 121 (H) 65 - 99 mg/dL   BUN 52 (H) 6 - 20 mg/dL   Creatinine, Ser 1.88 (H) 0.61 - 1.24 mg/dL   Calcium 9.5 8.9 - 10.3 mg/dL   GFR calc non Af Amer 30 (L) >60 mL/min   GFR calc Af Amer 35 (L) >60 mL/min   Anion gap 9 5 - 15  CBC  Result Value Ref Range   WBC 7.8 4.0 - 10.5 K/uL   RBC 2.51 (L) 4.22 - 5.81 MIL/uL   Hemoglobin 7.2 (L) 13.0 - 17.0 g/dL   HCT 23.0 (L) 39.0 - 52.0 %   MCV 91.6 78.0 - 100.0 fL   MCH 28.7 26.0 - 34.0 pg   MCHC 31.3 30.0 - 36.0 g/dL   RDW 14.4 11.5 - 15.5 %   Platelets 182 150 - 400 K/uL  Urinalysis, Routine w reflex microscopic  Result Value Ref Range   Color, Urine STRAW (A) YELLOW   APPearance CLEAR CLEAR   Specific Gravity, Urine 1.012 1.005 - 1.030   pH 6.0 5.0 - 8.0   Glucose, UA NEGATIVE NEGATIVE mg/dL   Hgb urine dipstick NEGATIVE NEGATIVE   Bilirubin Urine NEGATIVE NEGATIVE   Ketones, ur NEGATIVE NEGATIVE mg/dL   Protein, ur NEGATIVE NEGATIVE mg/dL   Nitrite NEGATIVE NEGATIVE   Leukocytes, UA NEGATIVE NEGATIVE  CBG monitoring, ED  Result Value Ref Range   Glucose-Capillary 114 (H) 65 - 99 mg/dL  POC occult blood, ED Provider will collect  Result Value Ref Range   Fecal Occult Bld POSITIVE (A) NEGATIVE   EKG EKG Interpretation  Date/Time:  Friday June 14 2017 12:25:48 EDT Ventricular Rate:  67 PR Interval:    QRS Duration: 106 QT Interval:  396 QTC Calculation: 418 R Axis:   -30 Text Interpretation:  Sinus rhythm Prolonged PR interval Confirmed by  Lajean Saver [phone]) on 06/14/2017 1:04:40 PM   Radiology No results  found.  Procedures Procedures (including critical care time)  Medications Ordered in ED Medications - No data to display   Initial Impression / Assessment and Plan / ED Course  I have reviewed the triage vital signs and the nursing notes.  Pertinent labs & imaging results that were available during my care of the patient were reviewed by me and considered in my medical decision making (see chart for details).  Iv ns. Continuous pulse ox and monitor.  Stat labs and ecg.   Reviewed nursing notes and prior charts for additional history.   Labs reviewed - pt is severely anemic and stools heme positive.  Pt with symptomatic anemia and symptomatically orthostatic.   Will emergently transfuse 2 units prbc.   Gi consulted. Discussed w Dr Benson Norway, will see in consult.  CRITICAL CARE RE: severe symptomatic anemia, requiring emergent blood transfusion/admission, heme pos stools, renal insufficiency.  Performed by: Mirna Mires Total critical care time: 35 minutes Critical care time was exclusive of separately billable procedures and treating other patients. Critical care was necessary to treat or prevent imminent or life-threatening deterioration. Critical care was time spent personally by me on the following activities: development of treatment plan with patient and/or surrogate as well as nursing, discussions with consultants, evaluation of patient's response to treatment, examination of patient, obtaining history from patient or surrogate, ordering and performing treatments and interventions, ordering and review of laboratory studies, ordering and review of radiographic studies, pulse oximetry and re-evaluation of patient's condition.    Medicine/hospitalists consulted for admission.    Final Clinical Impressions(s) / ED Diagnoses   Final diagnoses:  None    ED Discharge Orders    None            Lajean Saver, MD 07/27/17 1435

## 2017-06-14 NOTE — ED Notes (Signed)
This nurse attempted to call report

## 2017-06-14 NOTE — Consult Note (Addendum)
Reason for Consult: Anemia and history of colonic AVMs Referring Physician: [facility]  Tollie Pizza HPI: This is a 84 year old male who is well-known to me for his history of proximal colonic AVMs with resultant anemia.  He is admitted today as he reported feeling weak to his PCP and his HGB was noted to be at 6.9 g/dL.  A hemoccult was performed and it was positive for blood.  His last colonoscopy was on 07/16/2013.  His last HGB in the office was on 11/15/2014 and it was 12.4 g/dL.  The patient denies any issues with chest pain, SOB, MI, hematochezia, or melena.  This past winter was difficult for him as he suffered with the flu and he also fell with a resultant wrist fracture.  Surgery was required to resolve the issue and he is healing well.  He states that he suffered with right knee pain and diclofenac gel was provided for him.  Past Medical History:  Diagnosis Date  . Anemia    low hemoglobin  . Arthritis   . AV (angiodysplasia malformation of colon)   . Benign prostatic hypertrophy   . Cancer ([place])    basal cell cancer  . Chronic kidney disease    elevated Cr, BUN, Dr. Hassell Done manages  . COPD (chronic obstructive pulmonary disease) ([place])   . Coronary artery disease   . GERD (gastroesophageal reflux disease)   . Gout   . Heart murmur   . History of kidney stones   . Hyperlipidemia   . Hypertension   . Influenza    February 2019  . Mitral regurgitation   . Mitral valve prolapse   . Pneumonia 1970's  . PONV (postoperative nausea and vomiting)    1st knee replacement  . Shortness of breath     Past Surgical History:  Procedure Laterality Date  . ABDOMINAL ANGIOGRAM  04/30/2011   Procedure: ABDOMINAL ANGIOGRAM;  Surgeon: Lorretta Harp, MD;  Location: [facility] CATH LAB;  Service: Cardiovascular;;  . CARDIAC CATHETERIZATION  04/2011   x2  . CARDIAC CATHETERIZATION  08/2009  . CATARACT EXTRACTION  2011   bilateral  . COLONOSCOPY  05/04/2011   Procedure: COLONOSCOPY;   Surgeon: Beryle Beams, MD;  Location: [facility] ENDOSCOPY;  Service: Endoscopy;  Laterality: N/A;  . COLONOSCOPY N/A 07/16/2013   Procedure: COLONOSCOPY;  Surgeon: Beryle Beams, MD;  Location: [place];  Service: Endoscopy;  Laterality: N/A;  . CORONARY ANGIOGRAM  04/30/2011   Procedure: CORONARY ANGIOGRAM;  Surgeon: Lorretta Harp, MD;  Location: [facility] CATH LAB;  Service: Cardiovascular;;  . ESOPHAGOGASTRODUODENOSCOPY  05/04/2011   Procedure: ESOPHAGOGASTRODUODENOSCOPY (EGD);  Surgeon: Beryle Beams, MD;  Location: [facility] ENDOSCOPY;  Service: Endoscopy;  Laterality: N/A;  . EYE SURGERY     bilateral cataracts removed  . HERNIA REPAIR  1999&2000   bilateral  . LOBECTOMY  1980   left lower lobectomy for benign tumor  . OPEN REDUCTION INTERNAL FIXATION (ORIF) DISTAL RADIAL FRACTURE Left 03/19/2017   Procedure: OPEN REDUCTION INTERNAL FIXATION (ORIF) DISTAL RADIAL FRACTURE;  Surgeon: Dorna Leitz, MD;  Location: [place];  Service: Orthopedics;  Laterality: Left;  AXILLARY BLOCK  . REPLACEMENT TOTAL KNEE  2005   right  . RIGHT HEART CATHETERIZATION  04/30/2011   Procedure: RIGHT HEART CATH;  Surgeon: Lorretta Harp, MD;  Location: [facility] CATH LAB;  Service: Cardiovascular;;  . TEE WITHOUT CARDIOVERSION  04/30/2011   Procedure: TRANSESOPHAGEAL ECHOCARDIOGRAM (TEE);  Surgeon: Sanda Klein, MD;  Location:  [facility] ENDOSCOPY;  Service: Cardiovascular;  Laterality: N/A;  3D TEE/ patient will be admitted the day before test , therefo patient will be a inpatient the day of TEE  . TOTAL KNEE ARTHROPLASTY Left 02/15/2012   Procedure: TOTAL KNEE ARTHROPLASTY;  Surgeon: Alta Corning, MD;  Location: [place];  Service: Orthopedics;  Laterality: Left;    Family History  Problem Relation Age of Onset  . Hypertension Mother   . Osteoporosis Mother   . Lung cancer Father   . Irregular heart beat Brother   . Heart attack Maternal Grandfather   . Cancer Paternal Grandfather     Social History:  reports that he quit smoking  about 56 years ago. He quit after 20.00 years of use. He quit smokeless tobacco use about 56 years ago. He reports that he drinks about 4.2 oz of alcohol per week. He reports that he does not use drugs.  Allergies: No Known Allergies  Medications:  Scheduled: . atorvastatin  10 mg Oral q1800  . diltiazem  300 mg Oral Daily   Continuous: . sodium chloride      Results for orders placed or performed during the hospital encounter of 06/14/17 (from the past 24 hour(s))  Urinalysis, Routine w reflex microscopic     Status: Abnormal   Collection Time: 06/14/17 12:10 PM  Result Value Ref Range   Color, Urine STRAW (A) YELLOW   APPearance CLEAR CLEAR   Specific Gravity, Urine 1.012 1.005 - 1.030   pH 6.0 5.0 - 8.0   Glucose, UA NEGATIVE NEGATIVE mg/dL   Hgb urine dipstick NEGATIVE NEGATIVE   Bilirubin Urine NEGATIVE NEGATIVE   Ketones, ur NEGATIVE NEGATIVE mg/dL   Protein, ur NEGATIVE NEGATIVE mg/dL   Nitrite NEGATIVE NEGATIVE   Leukocytes, UA NEGATIVE NEGATIVE  Basic metabolic panel     Status: Abnormal   Collection Time: 06/14/17 12:41 PM  Result Value Ref Range   Sodium 138 135 - 145 mmol/L   Potassium 4.9 3.5 - 5.1 mmol/L   Chloride 107 101 - 111 mmol/L   CO2 22 22 - 32 mmol/L   Glucose, Bld 121 (H) 65 - 99 mg/dL   BUN 52 (H) 6 - 20 mg/dL   Creatinine, Ser 1.88 (H) 0.61 - 1.24 mg/dL   Calcium 9.5 8.9 - 10.3 mg/dL   GFR calc non Af Amer 30 (L) >60 mL/min   GFR calc Af Amer 35 (L) >60 mL/min   Anion gap 9 5 - 15  CBC     Status: Abnormal   Collection Time: 06/14/17 12:41 PM  Result Value Ref Range   WBC 7.8 4.0 - 10.5 K/uL   RBC 2.51 (L) 4.22 - 5.81 MIL/uL   Hemoglobin 7.2 (L) 13.0 - 17.0 g/dL   HCT 23.0 (L) 39.0 - 52.0 %   MCV 91.6 78.0 - 100.0 fL   MCH 28.7 26.0 - 34.0 pg   MCHC 31.3 30.0 - 36.0 g/dL   RDW 14.4 11.5 - 15.5 %   Platelets 182 150 - 400 K/uL  CBG monitoring, ED     Status: Abnormal   Collection Time: 06/14/17 12:47 PM  Result Value Ref Range    Glucose-Capillary 114 (H) 65 - 99 mg/dL  POC occult blood, ED Provider will collect     Status: Abnormal   Collection Time: 06/14/17  1:12 PM  Result Value Ref Range   Fecal Occult Bld POSITIVE (A) NEGATIVE  Type and screen     Status: None  Collection Time: 06/14/17  1:38 PM  Result Value Ref Range   ABO/RH(D) O NEG    Antibody Screen NEG    Sample Expiration      06/17/2017 Performed at [facility], [place] [address] [place], [place] [address]   Prepare RBC     Status: None   Collection Time: 06/14/17  3:37 PM  Result Value Ref Range   Order Confirmation      ORDER PROCESSED BY BLOOD BANK Performed at [place] [address] [place], [place] [address]      No results found.  ROS:  As stated above in the HPI otherwise negative.  Blood pressure (!) 173/53, pulse 72, temperature 97.6 F (36.4 C), temperature source Oral, resp. rate (!) 9, height 6' (1.829 m), weight 78 kg (172 lb), SpO2 99 %.    PE: Gen: NAD, Alert and Oriented HEENT:  [place]/AT, EOMI Neck: Supple, no LAD Lungs: CTA Bilaterally CV: RRR without M/G/R ABM: Soft, NTND, +BS Ext: No C/C/E  Assessment/Plan: 1) Anemia. 2) History of proximal colonic AVMs. 3) Fatigue.   Mr. Bonawitz appears to be in good health for his age.  At his request, he desires to go home and to undergo the colonoscopy as an outpatient once his transfusions are complete.  In the past this arrangement was provided for him and he did well.  He is hemodynamically stable and his only complain was the fatigue.  Once his fatigue resolves with the blood transfusions he can go home and he will be scheduled for a colonoscopy next week. The recent use of diclofenac is certainly a contributing factor for his anemia.  Plan: 1) Agree with the blood transfusions. 2) Anticipate D/C home tomorrow.  Anishka Bushard D 06/14/2017, 5:26 PM

## 2017-06-15 DIAGNOSIS — R195 Other fecal abnormalities: Secondary | ICD-10-CM

## 2017-06-15 DIAGNOSIS — N179 Acute kidney failure, unspecified: Secondary | ICD-10-CM

## 2017-06-15 DIAGNOSIS — N183 Chronic kidney disease, stage 3 (moderate): Secondary | ICD-10-CM | POA: Diagnosis not present

## 2017-06-15 DIAGNOSIS — D649 Anemia, unspecified: Secondary | ICD-10-CM

## 2017-06-15 DIAGNOSIS — Z8774 Personal history of (corrected) congenital malformations of heart and circulatory system: Secondary | ICD-10-CM

## 2017-06-15 DIAGNOSIS — R5383 Other fatigue: Secondary | ICD-10-CM | POA: Diagnosis not present

## 2017-06-15 DIAGNOSIS — D500 Iron deficiency anemia secondary to blood loss (chronic): Secondary | ICD-10-CM | POA: Diagnosis not present

## 2017-06-15 DIAGNOSIS — N29 Other disorders of kidney and ureter in diseases classified elsewhere: Secondary | ICD-10-CM | POA: Diagnosis not present

## 2017-06-15 DIAGNOSIS — R531 Weakness: Secondary | ICD-10-CM | POA: Diagnosis not present

## 2017-06-15 LAB — CBC
HEMATOCRIT: 25 % — AB (ref 39.0–52.0)
HEMOGLOBIN: 8.3 g/dL — AB (ref 13.0–17.0)
MCH: 29.6 pg (ref 26.0–34.0)
MCHC: 33.2 g/dL (ref 30.0–36.0)
MCV: 89.3 fL (ref 78.0–100.0)
Platelets: 139 10*3/uL — ABNORMAL LOW (ref 150–400)
RBC: 2.8 MIL/uL — ABNORMAL LOW (ref 4.22–5.81)
RDW: 14.7 % (ref 11.5–15.5)
WBC: 6.1 10*3/uL (ref 4.0–10.5)

## 2017-06-15 LAB — BASIC METABOLIC PANEL
Anion gap: 8 (ref 5–15)
BUN: 45 mg/dL — ABNORMAL HIGH (ref 6–20)
CHLORIDE: 108 mmol/L (ref 101–111)
CO2: 22 mmol/L (ref 22–32)
CREATININE: 1.7 mg/dL — AB (ref 0.61–1.24)
Calcium: 8.9 mg/dL (ref 8.9–10.3)
GFR calc non Af Amer: 34 mL/min — ABNORMAL LOW (ref >60)
GFR, EST AFRICAN AMERICAN: 39 mL/min — AB (ref >60)
Glucose, Bld: 101 mg/dL — ABNORMAL HIGH (ref 65–99)
Potassium: 4.5 mmol/L (ref 3.5–5.1)
Sodium: 138 mmol/L (ref 135–145)

## 2017-06-15 MED ORDER — ASPIRIN EC 81 MG PO TBEC: 81.0000 mg | DELAYED_RELEASE_TABLET | Freq: Every day | ORAL | 0 refills | Status: DC

## 2017-06-15 NOTE — Progress Notes (Cosign Needed Addendum)
Pt approved for discharge by DR. Met with case manager and is getting changed with wife while waiting for all the discharge papers to be completed.  I removed IV and telemetry box. His  SCD were also removed.  Pt removed condom catheter and telemetry electrodes by himself.   He is excited to be getting out of the hospital.

## 2017-06-15 NOTE — Care Management CC44 (Signed)
Condition Code 44 Documentation Completed  Patient Details  Name: [name] MRN: [id number] Date of Birth: 02/15/1925   Condition Code 44 given:  Yes Patient signature on Condition Code 44 notice:  Yes Documentation of 2 MD's agreement:  Yes Code 44 added to claim:  Yes    Erenest Rasher, RN 06/15/2017, 11:38 AM

## 2017-06-15 NOTE — Progress Notes (Signed)
Reviewed discharge information with patient and caregiver. Answered all questions. Patient/caregiver able to teach back medications and reasons to contact MD/911. Patient verbalizes importance of PCP follow up appointment.  Juanangel Soderholm M. Floretta Petro, RN  

## 2017-06-15 NOTE — Care Management Obs Status (Signed)
[place] NOTIFICATION   Patient Details  Name: [name] MRN: [id number] Date of Birth: [birth year]   Medicare Observation Status Notification Given:  Yes    Erenest Rasher, RN 06/15/2017, 11:38 AM

## 2017-06-15 NOTE — Discharge Summary (Addendum)
Discharge Summary  [name] JKD:[id number] DOB: Mar 07, 1925  PCP: [name], MD  Admit date: 06/14/2017 Discharge date: 06/15/2017  Time spent: <29mins  Recommendations for Outpatient Follow-up:  1. F/u with PMD within a week  for hospital discharge follow up, repeat cbc/bmp at follow up. 2. F/u with GI Dr Benson Norway, Dr Benson Norway to decide when to resume asa.     Discharge Diagnoses:  Active Hospital Problems   Diagnosis Date Noted  . GI bleed 06/14/2017    Resolved Hospital Problems  No resolved problems to display.    Discharge Condition: stable  Diet recommendation: heart healthy  Filed Weights   06/14/17 1209 06/14/17 1949  Weight: 78 kg (172 lb) 78.5 kg (173 lb)    History of present illness:  PCP: [name], MD Patient coming from: Home  Chief Complaint: Anemia  HPI: [name] is a 84 y.o. male with medical history significant of chronic anemia, AV malformations, CKD, COPD, CAD admitted with generalized weakness dyspnea on exertion and shortness of breath.  He reports getting blood transfusion a year ago.  He denies any hematemesis or hematuria or black stools.  However he tested FOBT positive in the ER.  Patient was sent to the hospital from the PCPs office but due to hemoglobin of 6.9.  He denies any abdominal pain or chest pain or syncope.  Denies urinary complaints.  Denies cough fever chills headaches.  He does have a good appetite and there is no weight loss.  Studies obtained from the patient his wife is by the bedside.  Patient takes baby aspirin daily.  Denies taking Advil or  Motrin.    ED Course: His vital signs in the ER was 1 59/68 71.20 and 98% on room air.  Temperature 97.6 ED physician spoke to Dr. Benson Norway GI who will see the patient in the hospital.  Blood transfusion for 2 units have been arranged in the ER.  His sodium was 138 potassium 4.9 BUN 52 creatinine 1.88 white count 7.8 hemoglobin 7.2 platelet count 182.  FOBT  positive, EKG sinus rhythm prolonged PR interval.    Hospital Course:  Active Problems:   GI bleed    Symptomatic anemia -Likely from chronic GI blood loss with history of proximal colonic AVMs -He received 2 units PRBC, hemoglobin improved to 8.3.  He is feeling better. No acute bleed in the hospital.  He denies of stomach pain.  Vital signs are stable. -GI Dr. Benson Norway consulted, Diclofenac gel discontinued due to concerns of gi side effect per Dr Ulyses Amor recommendation -Diona Fanti held  -patient is cleared to discharge home by GI, he is to return to gi early next week for outpatient colonoscopy.   AKI on CKDIII -I have reviewed his past lab work, it appears that his creatinine has been slowly trending up this year. -Creatinine on presentation was 1.88, received gentle hydration, and blood product, creatinine trending down to 1.7 -UA unremarkable -Might be component of AKI on CKD, but also concern for progressive CKD. -PMD to monitor renal function, consider to adjust lisinopril or discontinue lisinopril if renal function continue to worse.  HTN; stable , continue home meds, pmd to adjust bp meds if renal function does not improve.  history of CAD on aspirin will hold for now. history of gout on allopurinol.  hyperlipidemia continue Lipitor.   Procedures:  prbc transfusion x2units  Consultations:  GI Dr Benson Norway   Code Status: DNR Family Communication: Wife in the room Disposition Plan: home  Discharge Exam: BP (!) 161/65   Pulse 68   Temp 97.9 F (36.6 C) (Oral)   Resp 19   Ht 6' (1.829 m)   Wt 78.5 kg (173 lb)   SpO2 99%   BMI 23.46 kg/m   General: NAD, appear younger than stated age, very sharp minded Cardiovascular: RRR Respiratory: CTABL    Discharge Instructions You were cared for by a hospitalist during your hospital stay. If you have any questions about your discharge medications or the care you received while you were in the hospital after you are  discharged, you can call the unit and asked to speak with the hospitalist on call if the hospitalist that took care of you is not available. Once you are discharged, your primary care physician will handle any further medical issues. Please note that NO REFILLS for any discharge medications will be authorized once you are discharged, as it is imperative that you return to your primary care physician (or establish a relationship with a primary care physician if you do not have one) for your aftercare needs so that they can reassess your need for medications and monitor your lab values.  Discharge Instructions    Diet - low sodium heart healthy   Complete by:  As directed    Increase activity slowly   Complete by:  As directed      Allergies as of 06/15/2017   No Known Allergies     Medication List    STOP taking these medications   traMADol 50 MG tablet Commonly known as:  ULTRAM     TAKE these medications   acetaminophen 325 MG tablet Commonly known as:  TYLENOL Take 650 mg by mouth every 6 (six) hours as needed for mild pain or headache.   allopurinol 300 MG tablet Commonly known as:  ZYLOPRIM Take 150 mg by mouth daily.   aspirin EC 81 MG tablet Take 1 tablet (81 mg total) by mouth daily. Hold aspirin for now, please discuss with Dr Benson Norway to decide on when to resume. What changed:  additional instructions   atorvastatin 10 MG tablet Commonly known as:  LIPITOR Take 1 tablet (10 mg total) by mouth daily at 6 PM. <PLEASE MAKE APPOINTMENT FOR REFILLS>   calcium-vitamin D 500-200 MG-UNIT tablet Commonly known as:  OSCAL WITH D Take 1 tablet by mouth every evening.   diclofenac sodium 1 % Gel Commonly known as:  VOLTAREN APPLY 1 GRAM UP TO 4 TIMES A DAY AS NEEDED FOR PAIN   diltiazem 300 MG 24 hr capsule Commonly known as:  CARDIZEM CD Take 300 mg by mouth daily.   lisinopril 40 MG tablet Commonly known as:  PRINIVIL,ZESTRIL Take 40 mg by mouth daily.   omeprazole 20  MG tablet Commonly known as:  PRILOSEC OTC Take 20 mg by mouth every other day.   polyethylene glycol packet Commonly known as:  MIRALAX / GLYCOLAX Take 17 g by mouth daily as needed for moderate constipation.      No Known Allergies Follow-up Information    [name], MD Follow up in 1 week(s).   Specialty:  Internal Medicine Why:  hospital discharge follow up. repeat cbc/bmp at hospital discharge follow up. Contact information: [address] [place] [address] [phone]        Carol Ada, MD Follow up.   Specialty:  Gastroenterology Contact information: [address] [place] [address] [phone]  The results of significant diagnostics from this hospitalization (including imaging, microbiology, ancillary and laboratory) are listed below for reference.    Significant Diagnostic Studies: No results found.  Microbiology: No results found for this or any previous visit (from the past 240 hour(s)).   Labs: Basic Metabolic Panel: Recent Labs  Lab 06/14/17 1241 06/15/17 0404  NA 138 138  K 4.9 4.5  CL 107 108  CO2 22 22  GLUCOSE 121* 101*  BUN 52* 45*  CREATININE 1.88* 1.70*  CALCIUM 9.5 8.9   Liver Function Tests: No results for input(s): AST, ALT, ALKPHOS, BILITOT, PROT, ALBUMIN in the last 168 hours. No results for input(s): LIPASE, AMYLASE in the last 168 hours. No results for input(s): AMMONIA in the last 168 hours. CBC: Recent Labs  Lab 06/14/17 1241 06/15/17 0404  WBC 7.8 6.1  HGB 7.2* 8.3*  HCT 23.0* 25.0*  MCV 91.6 89.3  PLT 182 139*   Cardiac Enzymes: No results for input(s): CKTOTAL, CKMB, CKMBINDEX, TROPONINI in the last 168 hours. BNP: BNP (last 3 results) No results for input(s): BNP in the last 8760 hours.  ProBNP (last 3 results) No results for input(s): PROBNP in the last 8760 hours.  CBG: Recent Labs  Lab 06/14/17 1247  GLUCAP 114*        Signed:  Florencia Reasons MD, PhD  [facility] 06/15/2017, 10:14 AM

## 2017-06-15 NOTE — Progress Notes (Signed)
Subjective: Feeling better.  Objective: Vital signs in last 24 hours: Temp:  [97.6 F (36.4 C)-98.6 F (37 C)] 97.9 F (36.6 C) (06/15 0516) Pulse Rate:  [62-73] 64 (06/15 0516) Resp:  [9-21] 19 (06/15 0516) BP: (139-184)/(53-76) 148/76 (06/15 0516) SpO2:  [97 %-100 %] 99 % (06/15 0516) Weight:  [78 kg (172 lb)-78.5 kg (173 lb)] 78.5 kg (173 lb) (06/14 1949) Last BM Date: 06/14/17  Intake/Output from previous day: 06/14 0701 - 06/15 0700 In: 1252 [I.V.:255; Blood:997] Out: 600 [Urine:600] Intake/Output this shift: No intake/output data recorded.  General appearance: alert and no distress GI: soft, non-tender; bowel sounds normal; no masses,  no organomegaly  Lab Results: Recent Labs    06/14/17 1241 06/15/17 0404  WBC 7.8 6.1  HGB 7.2* 8.3*  HCT 23.0* 25.0*  PLT 182 139*   BMET Recent Labs    06/14/17 1241 06/15/17 0404  NA 138 138  K 4.9 4.5  CL 107 108  CO2 22 22  GLUCOSE 121* 101*  BUN 52* 45*  CREATININE 1.88* 1.70*  CALCIUM 9.5 8.9   LFT No results for input(s): PROT, ALBUMIN, AST, ALT, ALKPHOS, BILITOT, BILIDIR, IBILI in the last 72 hours. PT/INR No results for input(s): LABPROT, INR in the last 72 hours. Hepatitis Panel No results for input(s): HEPBSAG, HCVAB, HEPAIGM, HEPBIGM in the last 72 hours. C-Diff No results for input(s): CDIFFTOX in the last 72 hours. Fecal Lactopherrin No results for input(s): FECLLACTOFRN in the last 72 hours.  Studies/Results: No results found.  Medications:  Scheduled: . atorvastatin  10 mg Oral q1800  . diltiazem  300 mg Oral Daily   Continuous: . sodium chloride 50 mL/hr at 06/15/17 0054    Assessment/Plan: 1) Anemia. 2) Heme positive stool. 3) History of colonic AVMs.   The patient is well at this time.  He believes he feels better, but he has not exerted himself.  HGB did increase as anticipated and he is hemodynamically stable.  The patient can be discharged home safely, but he was told not to over  exert himself.  The transfusions were beneficial, but he is not at his baseline.  Arrangements will be made for him to undergo the colonoscopy next week.  LOS: 1 day   Perina Salvaggio D 06/15/2017, 9:06 AM

## 2017-06-15 NOTE — H&P (View-Only) (Signed)
Subjective: Feeling better.  Objective: Vital signs in last 24 hours: Temp:  [97.6 F (36.4 C)-98.6 F (37 C)] 97.9 F (36.6 C) (06/15 0516) Pulse Rate:  [62-73] 64 (06/15 0516) Resp:  [9-21] 19 (06/15 0516) BP: (139-184)/(53-76) 148/76 (06/15 0516) SpO2:  [97 %-100 %] 99 % (06/15 0516) Weight:  [78 kg (172 lb)-78.5 kg (173 lb)] 78.5 kg (173 lb) (06/14 1949) Last BM Date: 06/14/17  Intake/Output from previous day: 06/14 0701 - 06/15 0700 In: 1252 [I.V.:255; Blood:997] Out: 600 [Urine:600] Intake/Output this shift: No intake/output data recorded.  General appearance: alert and no distress GI: soft, non-tender; bowel sounds normal; no masses,  no organomegaly  Lab Results: Recent Labs    06/14/17 1241 06/15/17 0404  WBC 7.8 6.1  HGB 7.2* 8.3*  HCT 23.0* 25.0*  PLT 182 139*   BMET Recent Labs    06/14/17 1241 06/15/17 0404  NA 138 138  K 4.9 4.5  CL 107 108  CO2 22 22  GLUCOSE 121* 101*  BUN 52* 45*  CREATININE 1.88* 1.70*  CALCIUM 9.5 8.9   LFT No results for input(s): PROT, ALBUMIN, AST, ALT, ALKPHOS, BILITOT, BILIDIR, IBILI in the last 72 hours. PT/INR No results for input(s): LABPROT, INR in the last 72 hours. Hepatitis Panel No results for input(s): HEPBSAG, HCVAB, HEPAIGM, HEPBIGM in the last 72 hours. C-Diff No results for input(s): CDIFFTOX in the last 72 hours. Fecal Lactopherrin No results for input(s): FECLLACTOFRN in the last 72 hours.  Studies/Results: No results found.  Medications:  Scheduled: . atorvastatin  10 mg Oral q1800  . diltiazem  300 mg Oral Daily   Continuous: . sodium chloride 50 mL/hr at 06/15/17 0054    Assessment/Plan: 1) Anemia. 2) Heme positive stool. 3) History of colonic AVMs.   The patient is well at this time.  He believes he feels better, but he has not exerted himself.  HGB did increase as anticipated and he is hemodynamically stable.  The patient can be discharged home safely, but he was told not to over  exert himself.  The transfusions were beneficial, but he is not at his baseline.  Arrangements will be made for him to undergo the colonoscopy next week.  LOS: 1 day   [name] 06/15/2017, 9:06 AM

## 2017-06-17 ENCOUNTER — Other Ambulatory Visit: Payer: Self-pay | Admitting: Gastroenterology

## 2017-06-17 LAB — TYPE AND SCREEN
ABO/RH(D): O NEG
Antibody Screen: NEGATIVE
UNIT DIVISION: 0
UNIT DIVISION: 0

## 2017-06-17 LAB — BPAM RBC
BLOOD PRODUCT EXPIRATION DATE: 201907062359
BLOOD PRODUCT EXPIRATION DATE: 201907062359
ISSUE DATE / TIME: 201906141751
ISSUE DATE / TIME: 201906142219
UNIT TYPE AND RH: 9500
Unit Type and Rh: 9500

## 2017-06-18 DIAGNOSIS — N289 Disorder of kidney and ureter, unspecified: Secondary | ICD-10-CM

## 2017-06-18 DIAGNOSIS — R911 Solitary pulmonary nodule: Secondary | ICD-10-CM

## 2017-06-19 DIAGNOSIS — D500 Iron deficiency anemia secondary to blood loss (chronic): Secondary | ICD-10-CM | POA: Diagnosis not present

## 2017-06-20 ENCOUNTER — Encounter (HOSPITAL_COMMUNITY): Payer: Self-pay | Admitting: *Deleted

## 2017-06-20 ENCOUNTER — Ambulatory Visit (HOSPITAL_COMMUNITY): Payer: PPO | Admitting: Anesthesiology

## 2017-06-20 ENCOUNTER — Other Ambulatory Visit: Payer: Self-pay

## 2017-06-20 ENCOUNTER — Ambulatory Visit (HOSPITAL_COMMUNITY)
Admission: RE | Admit: 2017-06-20 | Discharge: 2017-06-20 | Disposition: A | Discharge status: Home / Self Care | Payer: PPO | Source: Ambulatory Visit | Attending: Gastroenterology | Admitting: Gastroenterology

## 2017-06-20 ENCOUNTER — Encounter (HOSPITAL_COMMUNITY)
Admission: RE | Disposition: A | Discharge status: Home / Self Care | Payer: Self-pay | Source: Ambulatory Visit | Attending: Gastroenterology

## 2017-06-20 DIAGNOSIS — I2510 Atherosclerotic heart disease of native coronary artery without angina pectoris: Secondary | ICD-10-CM | POA: Diagnosis not present

## 2017-06-20 DIAGNOSIS — D509 Iron deficiency anemia, unspecified: Secondary | ICD-10-CM | POA: Insufficient documentation

## 2017-06-20 DIAGNOSIS — K5730 Diverticulosis of large intestine without perforation or abscess without bleeding: Secondary | ICD-10-CM | POA: Diagnosis not present

## 2017-06-20 DIAGNOSIS — J449 Chronic obstructive pulmonary disease, unspecified: Secondary | ICD-10-CM | POA: Diagnosis not present

## 2017-06-20 DIAGNOSIS — I10 Essential (primary) hypertension: Secondary | ICD-10-CM | POA: Insufficient documentation

## 2017-06-20 DIAGNOSIS — D125 Benign neoplasm of sigmoid colon: Secondary | ICD-10-CM | POA: Diagnosis not present

## 2017-06-20 DIAGNOSIS — D500 Iron deficiency anemia secondary to blood loss (chronic): Secondary | ICD-10-CM | POA: Diagnosis not present

## 2017-06-20 DIAGNOSIS — Z87891 Personal history of nicotine dependence: Secondary | ICD-10-CM | POA: Diagnosis not present

## 2017-06-20 DIAGNOSIS — Z79899 Other long term (current) drug therapy: Secondary | ICD-10-CM | POA: Diagnosis not present

## 2017-06-20 DIAGNOSIS — K219 Gastro-esophageal reflux disease without esophagitis: Secondary | ICD-10-CM | POA: Diagnosis not present

## 2017-06-20 DIAGNOSIS — K921 Melena: Secondary | ICD-10-CM | POA: Insufficient documentation

## 2017-06-20 DIAGNOSIS — D123 Benign neoplasm of transverse colon: Secondary | ICD-10-CM | POA: Insufficient documentation

## 2017-06-20 DIAGNOSIS — K5790 Diverticulosis of intestine, part unspecified, without perforation or abscess without bleeding: Secondary | ICD-10-CM | POA: Diagnosis not present

## 2017-06-20 DIAGNOSIS — Z7982 Long term (current) use of aspirin: Secondary | ICD-10-CM | POA: Insufficient documentation

## 2017-06-20 HISTORY — PX: POLYPECTOMY: SHX5525

## 2017-06-20 HISTORY — PX: COLONOSCOPY WITH PROPOFOL: SHX5780

## 2017-06-20 SURGERY — COLONOSCOPY WITH PROPOFOL
Anesthesia: Monitor Anesthesia Care

## 2017-06-20 MED ORDER — PROPOFOL 10 MG/ML IV BOLUS
INTRAVENOUS | Status: DC | PRN
  Administered 2017-06-20 (×2): 20 mg via INTRAVENOUS

## 2017-06-20 MED ORDER — PROPOFOL 500 MG/50ML IV EMUL
INTRAVENOUS | Status: DC | PRN
  Administered 2017-06-20: 125 ug/kg/min via INTRAVENOUS

## 2017-06-20 MED ORDER — PROPOFOL 10 MG/ML IV BOLUS
INTRAVENOUS | Status: AC
Start: 2017-06-20 — End: ?
  Filled 2017-06-20: qty 20

## 2017-06-20 MED ORDER — LACTATED RINGERS IV SOLN
INTRAVENOUS | Status: DC
  Administered 2017-06-20: 1000 mL via INTRAVENOUS

## 2017-06-20 MED ORDER — EPHEDRINE SULFATE-NACL 50-0.9 MG/10ML-% IV SOSY
PREFILLED_SYRINGE | INTRAVENOUS | Status: DC | PRN
  Administered 2017-06-20 (×2): 5 mg via INTRAVENOUS

## 2017-06-20 MED ORDER — LIDOCAINE 2% (20 MG/ML) 5 ML SYRINGE
INTRAMUSCULAR | Status: DC | PRN
  Administered 2017-06-20: 100 mg via INTRAVENOUS

## 2017-06-20 MED ORDER — SODIUM CHLORIDE 0.9 % IV SOLN: INTRAVENOUS | Status: DC

## 2017-06-20 SURGICAL SUPPLY — 21 items

## 2017-06-20 NOTE — Transfer of Care (Signed)
Immediate Anesthesia Transfer of Care Note  Patient: [name]  Procedure(s) Performed: COLONOSCOPY WITH PROPOFOL (N/A ) POLYPECTOMY  Patient Location: PACU and Endoscopy Unit  Anesthesia Type:MAC  Level of Consciousness: drowsy and patient cooperative  Airway & Oxygen Therapy: Patient Spontanous Breathing and Patient connected to face mask oxygen  Post-op Assessment: Report given to RN and Post -op Vital signs reviewed and stable  Post vital signs: Reviewed and stable  Last Vitals:  Vitals Value Taken Time  BP    Temp    Pulse 52 06/20/2017  2:27 PM  Resp 14 06/20/2017  2:27 PM  SpO2 98 % 06/20/2017  2:27 PM  Vitals shown include unvalidated device data.  Last Pain:  Vitals:   06/20/17 1248  TempSrc: Oral  PainSc: 0-No pain         Complications: No apparent anesthesia complications

## 2017-06-20 NOTE — Op Note (Signed)
[facility] Patient Name: [name] Procedure Date: 06/20/2017 MRN: [id number] Attending MD: Carol Ada , MD Date of Birth: [birth year] CSN: [id number] Age: 84 Admit Type: Outpatient Procedure:                Colonoscopy Indications:              Melena, Iron deficiency anemia Providers:                Carol Ada, MD, Burtis Junes, RN, Tinnie Gens,                            Technician, Dione Booze, CRNA Referring MD:              Medicines:                 Complications:            No immediate complications. Estimated Blood Loss:     Estimated blood loss: none. Procedure:                Pre-Anesthesia Assessment:                           - Prior to the procedure, a History and Physical                            was performed, and patient medications and                            allergies were reviewed. The patient's tolerance of                            previous anesthesia was also reviewed. The risks                            and benefits of the procedure and the sedation                            options and risks were discussed with the patient.                            All questions were answered, and informed consent                            was obtained. Prior Anticoagulants: The patient has                            taken no previous anticoagulant or antiplatelet                            agents. ASA Grade Assessment: III - A patient with                            severe systemic disease. After reviewing the risks  and benefits, the patient was deemed in                            satisfactory condition to undergo the procedure.                           - Sedation was administered by an anesthesia                            professional. Deep sedation was attained.                           After obtaining informed consent, the colonoscope                            was passed under direct vision. Throughout  the                            procedure, the patient's blood pressure, pulse, and                            oxygen saturations were monitored continuously. The                            EC-[id number] ([id number]) scope was introduced through                            the anus and advanced to the the terminal ileum.                            The colonoscopy was technically difficult and                            complex due to inadequate bowel prep. Successful                            completion of the procedure was aided by lavage.                            The patient tolerated the procedure well. The                            quality of the bowel preparation was good. The                            terminal ileum, ileocecal valve, appendiceal                            orifice, and rectum were photographed. Scope In: 1:50:27 PM Scope Out: 2:21:39 PM Scope Withdrawal Time: 0 hours 20 minutes 29 seconds  Total Procedure Duration: 0 hours 31 minutes 12 seconds  Findings:      Two sessile polyps were found in the sigmoid colon and transverse colon.       The polyps were 3 to  4 mm in size. These polyps were removed with a cold       snare. Resection and retrieval were complete.      Scattered small and large-mouthed diverticula were found in the sigmoid       colon.      There was a significant amount of retained vegetable matter in the       cecum. Lavage and suctioning was not able to clear the debris. A Jabier Mutton       net was used to extract the material. There was no evidence of any AVMs       in this area. The prior scarring from [facility] of previous AVMs were       identified. Two sessile polyps were noted and removed. Impression:               - Two 3 to 4 mm polyps in the sigmoid colon and in                            the transverse colon, removed with a cold snare.                            Resected and retrieved.                           - Diverticulosis in the sigmoid  colon. Moderate Sedation:      N/A- Per Anesthesia Care Recommendation:           - Patient has a contact number available for                            emergencies. The signs and symptoms of potential                            delayed complications were discussed with the                            patient. Return to normal activities tomorrow.                            Written discharge instructions were provided to the                            patient.                           - Resume previous diet.                           - Continue present medications.                           - Await pathology results.                           - Repeat colonoscopy is not recommended due to  current age (57 years or older) for surveillance.                           - Return to GI office in 4 weeks. Procedure Code(s):        --- Professional ---                           [id number], Colonoscopy, flexible; with removal of                            tumor(s), polyp(s), or other lesion(s) by snare                            technique Diagnosis Code(s):        --- Professional ---                           D12.5, Benign neoplasm of sigmoid colon                           D12.3, Benign neoplasm of transverse colon (hepatic                            flexure or splenic flexure)                           K92.1, Melena (includes Hematochezia)                           D50.9, Iron deficiency anemia, unspecified                           K57.30, Diverticulosis of large intestine without                            perforation or abscess without bleeding CPT copyright 2017 [employer]. All rights reserved. The codes documented in this report are preliminary and upon coder review may  be revised to meet current compliance requirements. Carol Ada, MD Carol Ada, MD 06/20/2017 2:45:48 PM This report has been signed electronically. Number of Addenda: 0

## 2017-06-20 NOTE — Anesthesia Preprocedure Evaluation (Addendum)
Anesthesia Evaluation  Patient identified by MRN, date of birth, ID band Patient awake    Reviewed: Allergy & Precautions, H&P , NPO status , Patient's Chart, lab work & pertinent test results  History of Anesthesia Complications (+) PONV and history of anesthetic complications  Airway Mallampati: II  TM Distance: >3 FB Neck ROM: Full    Dental no notable dental hx. (+) Dental Advisory Given, Partial Lower   Pulmonary COPD, former smoker,  S/p Left lower lobectomy for lung mass   Pulmonary exam normal breath sounds clear to auscultation       Cardiovascular hypertension, Pt. on medications + CAD  Normal cardiovascular exam+ Valvular Problems/Murmurs MVP and MR  Rhythm:Regular Rate:Normal  '18 TTE - Moderately dilated LV with mild concentric hypertrophy. EF 60% to 65%. Trivial AI. Mild MVP,   involving the posterior leaflet with moderate regurgitation   directed anteriorly. Left atrium was severely dilated. Mild-moderate TR. Moderate PR. PASP was mildly increased. PA  peak pressure: 41 mm Hg  '13 Cath - Cardiac cath 04/30/11:  1. Left main; normal.  2. LAD; minor irregularities.  3. Left circumflex; normal. There was a high marginal branch and the ramus distribution though small in caliber and had a focal 50% proximal stenosis.  4. Right coronary artery; 70% just after the takeoff of a high acute marginal branch.  5. Abdominal aortogram: The thoracic abdominal aorta associated renal abdominal, aortoiliac medication were free of significant atherosclerotic changes.     Neuro/Psych Some occasional numbness and tingling in left arm/hand since injury    GI/Hepatic GERD  Medicated and Controlled,  Endo/Other    Renal/GU Nephrolithiasis     Musculoskeletal  (+) Arthritis , Gout   Abdominal Normal abdominal exam  (+)   Peds  Hematology  (+) anemia ,   Anesthesia Other Findings   Reproductive/Obstetrics                              Anesthesia Physical  Anesthesia Plan  ASA: III  Anesthesia Plan: MAC   Post-op Pain Management:    Induction:   PONV Risk Score and Plan: Propofol infusion and Treatment may vary due to age or medical condition  Airway Management Planned: Natural Airway, Simple Face Mask and Nasal Cannula  Additional Equipment: None  Intra-op Plan:   Post-operative Plan:   Informed Consent: I have reviewed the patients History and Physical, chart, labs and discussed the procedure including the risks, benefits and alternatives for the proposed anesthesia with the patient or authorized representative who has indicated his/her understanding and acceptance.     Plan Discussed with: Anesthesiologist, CRNA and Surgeon  Anesthesia Plan Comments:        Anesthesia Quick Evaluation

## 2017-06-20 NOTE — Anesthesia Procedure Notes (Signed)
Procedure Name: MAC Date/Time: 06/20/2017 1:42 PM Performed by: Dione Booze, CRNA Pre-anesthesia Checklist: Patient identified, Emergency Drugs available, Suction available and Patient being monitored Patient Re-evaluated:Patient Re-evaluated prior to induction Oxygen Delivery Method: Simple face mask Placement Confirmation: positive ETCO2

## 2017-06-20 NOTE — Anesthesia Postprocedure Evaluation (Signed)
Anesthesia Post Note  Patient: [name]  Procedure(s) Performed: COLONOSCOPY WITH PROPOFOL (N/A ) POLYPECTOMY     Patient location during evaluation: PACU Anesthesia Type: MAC Level of consciousness: awake and alert Pain management: pain level controlled Vital Signs Assessment: post-procedure vital signs reviewed and stable Respiratory status: spontaneous breathing, nonlabored ventilation, respiratory function stable and patient connected to nasal cannula oxygen Cardiovascular status: stable and blood pressure returned to baseline Postop Assessment: no apparent nausea or vomiting Anesthetic complications: no    Last Vitals:  Vitals:   06/20/17 1248 06/20/17 1428  BP: (!) 153/60 (!) 101/53  Pulse: 72 (!) 53  Resp: 19 14  Temp: (!) 36.4 C 36.7 C  SpO2: 100% 100%    Last Pain:  Vitals:   06/20/17 1428  TempSrc: Axillary  PainSc:                  [name]

## 2017-06-20 NOTE — Interval H&P Note (Signed)
History and Physical Interval Note:  06/20/2017 1:32 PM  [name]  has presented today for surgery, with the diagnosis of rectal bleeding  The various methods of treatment have been discussed with the patient and family. After consideration of risks, benefits and other options for treatment, the patient has consented to  Procedure(s): COLONOSCOPY WITH PROPOFOL (N/A) as a surgical intervention .  The patient's history has been reviewed, patient examined, no change in status, stable for surgery.  I have reviewed the patient's chart and labs.  Questions were answered to the patient's satisfaction.     Jeslie Lowe D

## 2017-06-20 NOTE — Discharge Instructions (Signed)

## 2017-06-21 ENCOUNTER — Encounter (HOSPITAL_COMMUNITY): Payer: Self-pay | Admitting: Gastroenterology

## 2017-06-21 NOTE — Addendum Note (Signed)
Addendum  created 06/21/17 1358 by Janeece Riggers, MD   Intraprocedure Staff edited, Sign clinical note

## 2017-07-10 DIAGNOSIS — D649 Anemia, unspecified: Secondary | ICD-10-CM | POA: Diagnosis not present

## 2017-07-10 DIAGNOSIS — E782 Mixed hyperlipidemia: Secondary | ICD-10-CM | POA: Diagnosis not present

## 2017-07-10 DIAGNOSIS — D500 Iron deficiency anemia secondary to blood loss (chronic): Secondary | ICD-10-CM | POA: Diagnosis not present

## 2017-07-17 DIAGNOSIS — E871 Hypo-osmolality and hyponatremia: Secondary | ICD-10-CM | POA: Diagnosis not present

## 2017-07-17 DIAGNOSIS — D500 Iron deficiency anemia secondary to blood loss (chronic): Secondary | ICD-10-CM | POA: Diagnosis not present

## 2017-07-17 DIAGNOSIS — K5901 Slow transit constipation: Secondary | ICD-10-CM | POA: Diagnosis not present

## 2017-07-17 DIAGNOSIS — N183 Chronic kidney disease, stage 3 (moderate): Secondary | ICD-10-CM | POA: Diagnosis not present

## 2017-07-17 DIAGNOSIS — R911 Solitary pulmonary nodule: Secondary | ICD-10-CM | POA: Diagnosis not present

## 2017-07-23 DIAGNOSIS — D500 Iron deficiency anemia secondary to blood loss (chronic): Secondary | ICD-10-CM | POA: Diagnosis not present

## 2017-07-30 DIAGNOSIS — D500 Iron deficiency anemia secondary to blood loss (chronic): Secondary | ICD-10-CM | POA: Diagnosis not present

## 2017-07-31 DIAGNOSIS — K5900 Constipation, unspecified: Secondary | ICD-10-CM | POA: Diagnosis not present

## 2017-07-31 DIAGNOSIS — E871 Hypo-osmolality and hyponatremia: Secondary | ICD-10-CM | POA: Diagnosis not present

## 2017-07-31 DIAGNOSIS — D500 Iron deficiency anemia secondary to blood loss (chronic): Secondary | ICD-10-CM | POA: Diagnosis not present

## 2017-08-06 ENCOUNTER — Other Ambulatory Visit: Payer: Self-pay | Admitting: Cardiovascular Disease

## 2017-08-06 NOTE — Telephone Encounter (Signed)
Rx request sent to pharmacy.  

## 2017-08-14 DIAGNOSIS — E871 Hypo-osmolality and hyponatremia: Secondary | ICD-10-CM | POA: Diagnosis not present

## 2017-08-14 DIAGNOSIS — D500 Iron deficiency anemia secondary to blood loss (chronic): Secondary | ICD-10-CM | POA: Diagnosis not present

## 2017-08-21 DIAGNOSIS — E871 Hypo-osmolality and hyponatremia: Secondary | ICD-10-CM | POA: Diagnosis not present

## 2017-08-21 DIAGNOSIS — I10 Essential (primary) hypertension: Secondary | ICD-10-CM | POA: Diagnosis not present

## 2017-08-21 DIAGNOSIS — R7303 Prediabetes: Secondary | ICD-10-CM | POA: Diagnosis not present

## 2017-08-21 DIAGNOSIS — R911 Solitary pulmonary nodule: Secondary | ICD-10-CM | POA: Diagnosis not present

## 2017-08-21 DIAGNOSIS — D500 Iron deficiency anemia secondary to blood loss (chronic): Secondary | ICD-10-CM | POA: Diagnosis not present

## 2017-09-05 ENCOUNTER — Other Ambulatory Visit: Payer: Self-pay | Admitting: Cardiovascular Disease

## 2017-09-05 DIAGNOSIS — M25562 Pain in left knee: Secondary | ICD-10-CM | POA: Diagnosis not present

## 2017-09-05 DIAGNOSIS — M25561 Pain in right knee: Secondary | ICD-10-CM | POA: Diagnosis not present

## 2017-09-05 DIAGNOSIS — M1711 Unilateral primary osteoarthritis, right knee: Secondary | ICD-10-CM | POA: Diagnosis not present

## 2017-09-05 DIAGNOSIS — M1712 Unilateral primary osteoarthritis, left knee: Secondary | ICD-10-CM | POA: Diagnosis not present

## 2017-09-06 DIAGNOSIS — N183 Chronic kidney disease, stage 3 (moderate): Secondary | ICD-10-CM | POA: Diagnosis not present

## 2017-09-06 DIAGNOSIS — E872 Acidosis: Secondary | ICD-10-CM | POA: Diagnosis not present

## 2017-09-11 DIAGNOSIS — I129 Hypertensive chronic kidney disease with stage 1 through stage 4 chronic kidney disease, or unspecified chronic kidney disease: Secondary | ICD-10-CM | POA: Diagnosis not present

## 2017-09-11 DIAGNOSIS — R809 Proteinuria, unspecified: Secondary | ICD-10-CM | POA: Diagnosis not present

## 2017-09-11 DIAGNOSIS — N183 Chronic kidney disease, stage 3 (moderate): Secondary | ICD-10-CM | POA: Diagnosis not present

## 2017-09-11 DIAGNOSIS — E872 Acidosis: Secondary | ICD-10-CM | POA: Diagnosis not present

## 2017-09-11 DIAGNOSIS — N400 Enlarged prostate without lower urinary tract symptoms: Secondary | ICD-10-CM | POA: Diagnosis not present

## 2017-09-25 DIAGNOSIS — R7303 Prediabetes: Secondary | ICD-10-CM | POA: Diagnosis not present

## 2017-09-25 DIAGNOSIS — I10 Essential (primary) hypertension: Secondary | ICD-10-CM | POA: Diagnosis not present

## 2017-09-25 DIAGNOSIS — D500 Iron deficiency anemia secondary to blood loss (chronic): Secondary | ICD-10-CM | POA: Diagnosis not present

## 2017-09-25 DIAGNOSIS — E871 Hypo-osmolality and hyponatremia: Secondary | ICD-10-CM | POA: Diagnosis not present

## 2017-10-01 ENCOUNTER — Other Ambulatory Visit: Payer: Self-pay | Admitting: Cardiovascular Disease

## 2017-10-02 DIAGNOSIS — E782 Mixed hyperlipidemia: Secondary | ICD-10-CM | POA: Diagnosis not present

## 2017-10-02 DIAGNOSIS — I700 Atherosclerosis of aorta: Secondary | ICD-10-CM | POA: Diagnosis not present

## 2017-10-02 DIAGNOSIS — Z23 Encounter for immunization: Secondary | ICD-10-CM | POA: Diagnosis not present

## 2017-10-02 DIAGNOSIS — Z0000 Encounter for general adult medical examination without abnormal findings: Secondary | ICD-10-CM | POA: Diagnosis not present

## 2017-10-02 DIAGNOSIS — I6521 Occlusion and stenosis of right carotid artery: Secondary | ICD-10-CM | POA: Diagnosis not present

## 2017-10-02 DIAGNOSIS — D696 Thrombocytopenia, unspecified: Secondary | ICD-10-CM | POA: Diagnosis not present

## 2017-10-02 DIAGNOSIS — I495 Sick sinus syndrome: Secondary | ICD-10-CM | POA: Diagnosis not present

## 2017-10-02 DIAGNOSIS — M810 Age-related osteoporosis without current pathological fracture: Secondary | ICD-10-CM | POA: Diagnosis not present

## 2017-10-02 DIAGNOSIS — D500 Iron deficiency anemia secondary to blood loss (chronic): Secondary | ICD-10-CM | POA: Diagnosis not present

## 2017-10-02 DIAGNOSIS — I129 Hypertensive chronic kidney disease with stage 1 through stage 4 chronic kidney disease, or unspecified chronic kidney disease: Secondary | ICD-10-CM | POA: Diagnosis not present

## 2017-10-02 DIAGNOSIS — R911 Solitary pulmonary nodule: Secondary | ICD-10-CM | POA: Diagnosis not present

## 2017-10-02 DIAGNOSIS — N183 Chronic kidney disease, stage 3 (moderate): Secondary | ICD-10-CM | POA: Diagnosis not present

## 2017-10-02 DIAGNOSIS — R918 Other nonspecific abnormal finding of lung field: Secondary | ICD-10-CM | POA: Diagnosis not present

## 2017-10-02 DIAGNOSIS — I2510 Atherosclerotic heart disease of native coronary artery without angina pectoris: Secondary | ICD-10-CM | POA: Diagnosis not present

## 2017-10-08 ENCOUNTER — Other Ambulatory Visit: Payer: Self-pay | Admitting: Internal Medicine

## 2017-10-08 DIAGNOSIS — R911 Solitary pulmonary nodule: Secondary | ICD-10-CM

## 2017-10-10 ENCOUNTER — Ambulatory Visit
Admission: RE | Admit: 2017-10-10 | Discharge: 2017-10-10 | Disposition: A | Discharge status: Home / Self Care | Payer: PPO | Source: Ambulatory Visit | Attending: Internal Medicine | Admitting: Internal Medicine

## 2017-10-10 DIAGNOSIS — R911 Solitary pulmonary nodule: Secondary | ICD-10-CM

## 2017-10-10 DIAGNOSIS — J449 Chronic obstructive pulmonary disease, unspecified: Secondary | ICD-10-CM | POA: Diagnosis not present

## 2017-10-15 DIAGNOSIS — M25562 Pain in left knee: Secondary | ICD-10-CM | POA: Diagnosis not present

## 2017-10-15 DIAGNOSIS — M659 Synovitis and tenosynovitis, unspecified: Secondary | ICD-10-CM | POA: Diagnosis not present

## 2017-10-15 DIAGNOSIS — Z96652 Presence of left artificial knee joint: Secondary | ICD-10-CM | POA: Diagnosis not present

## 2017-11-02 ENCOUNTER — Other Ambulatory Visit: Payer: Self-pay | Admitting: Cardiovascular Disease

## 2017-11-04 NOTE — Telephone Encounter (Signed)
Rx(s) sent to pharmacy electronically.  

## 2017-11-06 DIAGNOSIS — D500 Iron deficiency anemia secondary to blood loss (chronic): Secondary | ICD-10-CM | POA: Diagnosis not present

## 2017-11-27 ENCOUNTER — Encounter: Payer: Self-pay | Admitting: Cardiovascular Disease

## 2017-11-27 ENCOUNTER — Ambulatory Visit (INDEPENDENT_AMBULATORY_CARE_PROVIDER_SITE_OTHER): Payer: PPO | Admitting: Cardiovascular Disease

## 2017-11-27 DIAGNOSIS — E785 Hyperlipidemia, unspecified: Secondary | ICD-10-CM

## 2017-11-27 DIAGNOSIS — I10 Essential (primary) hypertension: Secondary | ICD-10-CM | POA: Diagnosis not present

## 2017-11-27 DIAGNOSIS — I340 Nonrheumatic mitral (valve) insufficiency: Secondary | ICD-10-CM

## 2017-11-27 DIAGNOSIS — I2510 Atherosclerotic heart disease of native coronary artery without angina pectoris: Secondary | ICD-10-CM

## 2017-11-27 NOTE — Progress Notes (Signed)
11/27/2017 [name]   1925/04/24  [id number]  Primary Physician [name], MD Primary Cardiologist: Lorretta Harp MD Garret Reddish, [place], [place]  HPI:  [name] is a 84 y.o.  thin-appearing married Caucasian male, father of 2, grandfather to 2 grandchildren, who I last saw office 02/08/2016.. I last saw himin the office 06/09/13. He has a history of noncritical CAD by catheterization performed by myself, August 15, 2009, with moderate disease in his mid RCA, LAD, and ramus branch, and normal LV function. He did have severe MR secondary to mitral valve prolapse with severe left atrial enlargement. His other problems include hypertension and hyperlipidemia. He was complaining of increasing dyspnea. I brought him in, performing right and left heart catheterization, April 30, 2011, revealing a 50% proximal ramus branch stenosis, 70% mid dominant RCA stenosis just after the takeoff of an acute marginal branch. A TEE performed by Dr. Sallyanne Kuster on April 30, 2011, revealed severe flail mid scallop of the posterior leaflet secondary to ruptured chordae. He was fairly anemic at that time and was seen by Dr. Carol Ada. He underwent upper and lower endoscopy, revealing AVMs. His hemoglobin improved with iron repletion and his symptoms resolved. He is currently asymptomatic with a hemoglobin of 12.4 as recently as 11/15/14 's most recent lipid profile performed by his PCP 09/25/2017 revealed total cholesterol 119, LDL 58 and HDL 47... His last 2-D echocardiogram performed  02/22/2016 revealed normal LV size and function with moderate mitral regurgitation directed anteriorly. He is fairly active and works out at [employer] several times a week on the elliptical for 25 minutes as well as weight lifting.     Current Meds  Medication Sig  . acetaminophen (TYLENOL) 325 MG tablet Take 650 mg by mouth every 6 (six) hours as needed for mild pain or headache.  . allopurinol (ZYLOPRIM) 300  MG tablet Take 150 mg by mouth daily.   [name] aspirin EC 81 MG tablet Take 1 tablet (81 mg total) by mouth daily. Hold aspirin for now, please discuss with Dr Benson Norway to decide on when to resume.  [name] atorvastatin (LIPITOR) 10 MG tablet Take 1 tablet (10 mg total) by mouth daily at 6 PM.  . Cholecalciferol (VITAMIN D3) 50 MCG (2000 UT) TABS Take 50 mcg by mouth daily.  [name] diltiazem (CARDIZEM CD) 300 MG 24 hr capsule Take 300 mg by mouth daily.  [name] lisinopril (PRINIVIL,ZESTRIL) 40 MG tablet Take 40 mg by mouth daily.  [name] omeprazole (PRILOSEC OTC) 20 MG tablet Take 20 mg by mouth every other day.  . polyethylene glycol (MIRALAX / GLYCOLAX) packet Take 17 g by mouth daily as needed for moderate constipation.   . [DISCONTINUED] calcium-vitamin D (OSCAL WITH D) 500-200 MG-UNIT tablet Take 1 tablet by mouth every evening.     No Known Allergies  Social History   Socioeconomic History  . Marital status: Married    Spouse name: Not on file  . Number of children: Not on file  . Years of education: Not on file  . Highest education level: Not on file  Occupational History  . Not on file  Social Needs  . Financial resource strain: Not on file  . Food insecurity:    Worry: Not on file    Inability: Not on file  . Transportation needs:    Medical: Not on file    Non-medical: Not on file  Tobacco Use  . Smoking status: Former Smoker  Years: 20.00    Last attempt to quit: 02/08/1961    Years since quitting: 56.8  . Smokeless tobacco: Former Systems developer    Quit date: 02/08/1961  Substance and Sexual Activity  . Alcohol use: Yes    Alcohol/week: 7.0 standard drinks    Types: 7 Standard drinks or equivalent per week    Comment: 2ozs Building services engineer daily  . Drug use: No  . Sexual activity: Not Currently  Lifestyle  . Physical activity:    Days per week: Not on file    Minutes per session: Not on file  . Stress: Not on file  Relationships  . Social connections:    Talks on phone: Not on file    Gets together: Not  on file    Attends religious service: Not on file    Active member of club or organization: Not on file    Attends meetings of clubs or organizations: Not on file    Relationship status: Not on file  . Intimate partner violence:    Fear of current or ex partner: Not on file    Emotionally abused: Not on file    Physically abused: Not on file    Forced sexual activity: Not on file  Other Topics Concern  . Not on file  Social History Narrative  . Not on file     Review of Systems: General: negative for chills, fever, night sweats or weight changes.  Cardiovascular: negative for chest pain, dyspnea on exertion, edema, orthopnea, palpitations, paroxysmal nocturnal dyspnea or shortness of breath Dermatological: negative for rash Respiratory: negative for cough or wheezing Urologic: negative for hematuria Abdominal: negative for nausea, vomiting, diarrhea, bright red blood per rectum, melena, or hematemesis Neurologic: negative for visual changes, syncope, or dizziness All other systems reviewed and are otherwise negative except as noted above.    Blood pressure (!) 150/50, pulse 78, height 6' (1.829 m), weight 174 lb (78.9 kg).  General appearance: alert and no distress Neck: no adenopathy, no JVD, supple, symmetrical, trachea midline, thyroid not enlarged, symmetric, no tenderness/mass/nodules and Soft left carotid bruit Lungs: clear to auscultation bilaterally Heart: regular rate and rhythm, S1, S2 normal, no murmur, click, rub or gallop Extremities: extremities normal, atraumatic, no cyanosis or edema Pulses: 2+ and symmetric Skin: Skin color, texture, turgor normal. No rashes or lesions Neurologic: Alert and oriented X 3, normal strength and tone. Normal symmetric reflexes. Normal coordination and gait  EKG not performed today  ASSESSMENT AND PLAN:   Mitral regurgitation, severe History of severe mitral regurg in the past with his most recent echo performed 02/22/2016  revealing normal LV systolic function, mild valve prolapse involving the posterior leaflet with moderate regurgitation.  He is completely asymptomatic.  He also has moderate pulmonic valvular insufficiency and mild to moderate tricuspid valve insufficiency as well.  CAD, single vessel RCA History of CAD status post cardiac catheterization performed by myself 08/15/2009 and again 04/30/2011 revealing a 70% mid dominant RCA stenosis.  He is completely asymptomatic.  HTN (hypertension) History of essential hypertension blood pressure measured at 150/50.  He is on diltiazem and lisinopril.  Continue current meds at current dosing.  Dyslipidemia History of dyslipidemia on atorvastatin recent lipid profile performed 09/25/2017 revealing a total cholesterol of 119, LDL 58 and HDL of 47.      Lorretta Harp MD FACP,FACC,FAHA, [facility] 11/27/2017 2:47 PM

## 2017-11-27 NOTE — Assessment & Plan Note (Signed)
History of dyslipidemia on atorvastatin recent lipid profile performed 09/25/2017 revealing a total cholesterol of 119, LDL 58 and HDL of 47.

## 2017-11-27 NOTE — Assessment & Plan Note (Signed)
History of essential hypertension blood pressure measured at 150/50.  He is on diltiazem and lisinopril.  Continue current meds at current dosing.

## 2017-11-27 NOTE — Patient Instructions (Signed)
Medication Instructions:  NONE If you need a refill on your cardiac medications before your next appointment, please call your pharmacy.   Lab work: NONE If you have labs (blood work) drawn today and your tests are completely normal, you will receive your results only by: [name] MyChart Message (if you have MyChart) OR . A paper copy in the mail If you have any lab test that is abnormal or we need to change your treatment, we will call you to review the results.  Testing/Procedures: NONE  Follow-Up: At [facility], you and your health needs are our priority.  As part of our continuing mission to provide you with exceptional heart care, we have created designated Provider Care Teams.  These Care Teams include your primary Cardiologist (physician) and Advanced Practice Providers (APPs -  Physician Assistants and Nurse Practitioners) who all work together to provide you with the care you need, when you need it. You will need a follow up appointment in 12 months.  Please call our office 2 months in advance to schedule this appointment.  You may see DR. BERRY or one of the following Advanced Practice Providers on your designated Care Team:   Kerin Ransom, PA-C [place], [place] . Sande Rives, PA-C

## 2017-11-27 NOTE — Assessment & Plan Note (Signed)
History of CAD status post cardiac catheterization performed by myself 08/15/2009 and again 04/30/2011 revealing a 70% mid dominant RCA stenosis.  He is completely asymptomatic.

## 2017-11-27 NOTE — Assessment & Plan Note (Signed)
History of severe mitral regurg in the past with his most recent echo performed 02/22/2016 revealing normal LV systolic function, mild valve prolapse involving the posterior leaflet with moderate regurgitation.  He is completely asymptomatic.  He also has moderate pulmonic valvular insufficiency and mild to moderate tricuspid valve insufficiency as well.

## 2017-12-04 DIAGNOSIS — D500 Iron deficiency anemia secondary to blood loss (chronic): Secondary | ICD-10-CM | POA: Diagnosis not present

## 2017-12-11 DIAGNOSIS — S0101XA Laceration without foreign body of scalp, initial encounter: Secondary | ICD-10-CM | POA: Diagnosis not present

## 2017-12-11 DIAGNOSIS — D500 Iron deficiency anemia secondary to blood loss (chronic): Secondary | ICD-10-CM | POA: Diagnosis not present

## 2017-12-11 DIAGNOSIS — I700 Atherosclerosis of aorta: Secondary | ICD-10-CM | POA: Diagnosis not present

## 2017-12-11 DIAGNOSIS — E782 Mixed hyperlipidemia: Secondary | ICD-10-CM | POA: Diagnosis not present

## 2017-12-11 DIAGNOSIS — I2510 Atherosclerotic heart disease of native coronary artery without angina pectoris: Secondary | ICD-10-CM | POA: Diagnosis not present

## 2017-12-11 DIAGNOSIS — I129 Hypertensive chronic kidney disease with stage 1 through stage 4 chronic kidney disease, or unspecified chronic kidney disease: Secondary | ICD-10-CM | POA: Diagnosis not present

## 2018-01-09 DIAGNOSIS — D500 Iron deficiency anemia secondary to blood loss (chronic): Secondary | ICD-10-CM | POA: Diagnosis not present

## 2018-01-15 DIAGNOSIS — D500 Iron deficiency anemia secondary to blood loss (chronic): Secondary | ICD-10-CM | POA: Diagnosis not present

## 2018-01-27 DIAGNOSIS — D649 Anemia, unspecified: Secondary | ICD-10-CM | POA: Diagnosis not present

## 2018-01-27 DIAGNOSIS — D500 Iron deficiency anemia secondary to blood loss (chronic): Secondary | ICD-10-CM | POA: Diagnosis not present

## 2018-01-28 DIAGNOSIS — C44712 Basal cell carcinoma of skin of right lower limb, including hip: Secondary | ICD-10-CM | POA: Diagnosis not present

## 2018-02-04 DIAGNOSIS — D500 Iron deficiency anemia secondary to blood loss (chronic): Secondary | ICD-10-CM | POA: Diagnosis not present

## 2018-02-12 DIAGNOSIS — D500 Iron deficiency anemia secondary to blood loss (chronic): Secondary | ICD-10-CM | POA: Diagnosis not present

## 2018-02-26 DIAGNOSIS — R079 Chest pain, unspecified: Secondary | ICD-10-CM | POA: Diagnosis not present

## 2018-02-26 DIAGNOSIS — I959 Hypotension, unspecified: Secondary | ICD-10-CM | POA: Diagnosis not present

## 2018-02-26 DIAGNOSIS — I10 Essential (primary) hypertension: Secondary | ICD-10-CM | POA: Diagnosis not present

## 2018-02-27 DIAGNOSIS — D500 Iron deficiency anemia secondary to blood loss (chronic): Secondary | ICD-10-CM | POA: Diagnosis not present

## 2018-03-03 ENCOUNTER — Other Ambulatory Visit: Payer: Self-pay | Admitting: Cardiovascular Disease

## 2018-03-12 DIAGNOSIS — D500 Iron deficiency anemia secondary to blood loss (chronic): Secondary | ICD-10-CM | POA: Diagnosis not present

## 2018-03-19 DIAGNOSIS — E782 Mixed hyperlipidemia: Secondary | ICD-10-CM | POA: Diagnosis not present

## 2018-03-19 DIAGNOSIS — I129 Hypertensive chronic kidney disease with stage 1 through stage 4 chronic kidney disease, or unspecified chronic kidney disease: Secondary | ICD-10-CM | POA: Diagnosis not present

## 2018-03-19 DIAGNOSIS — D500 Iron deficiency anemia secondary to blood loss (chronic): Secondary | ICD-10-CM | POA: Diagnosis not present

## 2018-03-19 DIAGNOSIS — R748 Abnormal levels of other serum enzymes: Secondary | ICD-10-CM | POA: Diagnosis not present

## 2018-03-19 DIAGNOSIS — I2510 Atherosclerotic heart disease of native coronary artery without angina pectoris: Secondary | ICD-10-CM | POA: Diagnosis not present

## 2018-05-14 DIAGNOSIS — I129 Hypertensive chronic kidney disease with stage 1 through stage 4 chronic kidney disease, or unspecified chronic kidney disease: Secondary | ICD-10-CM | POA: Diagnosis not present

## 2018-05-14 DIAGNOSIS — D500 Iron deficiency anemia secondary to blood loss (chronic): Secondary | ICD-10-CM | POA: Diagnosis not present

## 2018-05-14 DIAGNOSIS — R748 Abnormal levels of other serum enzymes: Secondary | ICD-10-CM | POA: Diagnosis not present

## 2018-05-21 DIAGNOSIS — I129 Hypertensive chronic kidney disease with stage 1 through stage 4 chronic kidney disease, or unspecified chronic kidney disease: Secondary | ICD-10-CM | POA: Diagnosis not present

## 2018-05-21 DIAGNOSIS — I700 Atherosclerosis of aorta: Secondary | ICD-10-CM | POA: Diagnosis not present

## 2018-05-21 DIAGNOSIS — D500 Iron deficiency anemia secondary to blood loss (chronic): Secondary | ICD-10-CM | POA: Diagnosis not present

## 2018-05-21 DIAGNOSIS — I2510 Atherosclerotic heart disease of native coronary artery without angina pectoris: Secondary | ICD-10-CM | POA: Diagnosis not present

## 2018-05-21 DIAGNOSIS — R748 Abnormal levels of other serum enzymes: Secondary | ICD-10-CM | POA: Diagnosis not present

## 2018-05-21 DIAGNOSIS — I10 Essential (primary) hypertension: Secondary | ICD-10-CM | POA: Diagnosis not present

## 2018-05-21 DIAGNOSIS — R7303 Prediabetes: Secondary | ICD-10-CM | POA: Diagnosis not present

## 2018-05-21 DIAGNOSIS — Z7189 Other specified counseling: Secondary | ICD-10-CM | POA: Diagnosis not present

## 2018-05-21 DIAGNOSIS — E871 Hypo-osmolality and hyponatremia: Secondary | ICD-10-CM | POA: Diagnosis not present

## 2018-05-28 ENCOUNTER — Other Ambulatory Visit: Payer: Self-pay | Admitting: Cardiovascular Disease

## 2018-06-25 DIAGNOSIS — D500 Iron deficiency anemia secondary to blood loss (chronic): Secondary | ICD-10-CM | POA: Diagnosis not present

## 2018-07-23 DIAGNOSIS — D500 Iron deficiency anemia secondary to blood loss (chronic): Secondary | ICD-10-CM | POA: Diagnosis not present

## 2018-08-20 DIAGNOSIS — I2510 Atherosclerotic heart disease of native coronary artery without angina pectoris: Secondary | ICD-10-CM | POA: Diagnosis not present

## 2018-08-20 DIAGNOSIS — I700 Atherosclerosis of aorta: Secondary | ICD-10-CM | POA: Diagnosis not present

## 2018-08-20 DIAGNOSIS — R7303 Prediabetes: Secondary | ICD-10-CM | POA: Diagnosis not present

## 2018-08-20 DIAGNOSIS — Z7189 Other specified counseling: Secondary | ICD-10-CM | POA: Diagnosis not present

## 2018-08-20 DIAGNOSIS — E871 Hypo-osmolality and hyponatremia: Secondary | ICD-10-CM | POA: Diagnosis not present

## 2018-08-20 DIAGNOSIS — D500 Iron deficiency anemia secondary to blood loss (chronic): Secondary | ICD-10-CM | POA: Diagnosis not present

## 2018-08-20 DIAGNOSIS — I10 Essential (primary) hypertension: Secondary | ICD-10-CM | POA: Diagnosis not present

## 2018-08-27 DIAGNOSIS — E782 Mixed hyperlipidemia: Secondary | ICD-10-CM | POA: Diagnosis not present

## 2018-08-27 DIAGNOSIS — D500 Iron deficiency anemia secondary to blood loss (chronic): Secondary | ICD-10-CM | POA: Diagnosis not present

## 2018-08-27 DIAGNOSIS — R079 Chest pain, unspecified: Secondary | ICD-10-CM | POA: Diagnosis not present

## 2018-08-27 DIAGNOSIS — Z23 Encounter for immunization: Secondary | ICD-10-CM | POA: Diagnosis not present

## 2018-08-27 DIAGNOSIS — N401 Enlarged prostate with lower urinary tract symptoms: Secondary | ICD-10-CM | POA: Diagnosis not present

## 2018-08-27 DIAGNOSIS — I129 Hypertensive chronic kidney disease with stage 1 through stage 4 chronic kidney disease, or unspecified chronic kidney disease: Secondary | ICD-10-CM | POA: Diagnosis not present

## 2018-09-24 DIAGNOSIS — D500 Iron deficiency anemia secondary to blood loss (chronic): Secondary | ICD-10-CM | POA: Diagnosis not present

## 2018-09-24 DIAGNOSIS — I129 Hypertensive chronic kidney disease with stage 1 through stage 4 chronic kidney disease, or unspecified chronic kidney disease: Secondary | ICD-10-CM | POA: Diagnosis not present

## 2018-10-22 DIAGNOSIS — I129 Hypertensive chronic kidney disease with stage 1 through stage 4 chronic kidney disease, or unspecified chronic kidney disease: Secondary | ICD-10-CM | POA: Diagnosis not present

## 2018-10-22 DIAGNOSIS — D500 Iron deficiency anemia secondary to blood loss (chronic): Secondary | ICD-10-CM | POA: Diagnosis not present

## 2018-10-22 DIAGNOSIS — N183 Chronic kidney disease, stage 3 unspecified: Secondary | ICD-10-CM | POA: Diagnosis not present

## 2018-10-28 DIAGNOSIS — I2510 Atherosclerotic heart disease of native coronary artery without angina pectoris: Secondary | ICD-10-CM | POA: Diagnosis not present

## 2018-10-28 DIAGNOSIS — E782 Mixed hyperlipidemia: Secondary | ICD-10-CM | POA: Diagnosis not present

## 2018-10-28 DIAGNOSIS — I129 Hypertensive chronic kidney disease with stage 1 through stage 4 chronic kidney disease, or unspecified chronic kidney disease: Secondary | ICD-10-CM | POA: Diagnosis not present

## 2018-10-28 DIAGNOSIS — D500 Iron deficiency anemia secondary to blood loss (chronic): Secondary | ICD-10-CM | POA: Diagnosis not present

## 2018-10-28 DIAGNOSIS — N1832 Chronic kidney disease, stage 3b: Secondary | ICD-10-CM | POA: Diagnosis not present

## 2018-10-28 DIAGNOSIS — Z7189 Other specified counseling: Secondary | ICD-10-CM | POA: Diagnosis not present

## 2018-10-30 ENCOUNTER — Other Ambulatory Visit: Payer: Self-pay

## 2018-10-30 ENCOUNTER — Ambulatory Visit (HOSPITAL_COMMUNITY)
Admission: RE | Admit: 2018-10-30 | Discharge: 2018-10-30 | Disposition: A | Discharge status: Home / Self Care | Payer: PPO | Source: Ambulatory Visit | Attending: Family | Admitting: Family

## 2018-10-30 ENCOUNTER — Other Ambulatory Visit (HOSPITAL_COMMUNITY): Payer: Self-pay | Admitting: Internal Medicine

## 2018-10-30 DIAGNOSIS — I129 Hypertensive chronic kidney disease with stage 1 through stage 4 chronic kidney disease, or unspecified chronic kidney disease: Secondary | ICD-10-CM | POA: Diagnosis not present

## 2018-10-30 DIAGNOSIS — I2510 Atherosclerotic heart disease of native coronary artery without angina pectoris: Secondary | ICD-10-CM | POA: Diagnosis not present

## 2018-10-30 DIAGNOSIS — M7989 Other specified soft tissue disorders: Secondary | ICD-10-CM | POA: Insufficient documentation

## 2018-11-04 DIAGNOSIS — D500 Iron deficiency anemia secondary to blood loss (chronic): Secondary | ICD-10-CM | POA: Diagnosis not present

## 2018-11-05 DIAGNOSIS — E872 Acidosis: Secondary | ICD-10-CM | POA: Diagnosis not present

## 2018-11-05 DIAGNOSIS — N400 Enlarged prostate without lower urinary tract symptoms: Secondary | ICD-10-CM | POA: Diagnosis not present

## 2018-11-05 DIAGNOSIS — R809 Proteinuria, unspecified: Secondary | ICD-10-CM | POA: Diagnosis not present

## 2018-11-05 DIAGNOSIS — I129 Hypertensive chronic kidney disease with stage 1 through stage 4 chronic kidney disease, or unspecified chronic kidney disease: Secondary | ICD-10-CM | POA: Diagnosis not present

## 2018-11-05 DIAGNOSIS — N183 Chronic kidney disease, stage 3 unspecified: Secondary | ICD-10-CM | POA: Diagnosis not present

## 2018-11-25 DIAGNOSIS — Z7189 Other specified counseling: Secondary | ICD-10-CM | POA: Diagnosis not present

## 2018-11-25 DIAGNOSIS — D500 Iron deficiency anemia secondary to blood loss (chronic): Secondary | ICD-10-CM | POA: Diagnosis not present

## 2018-12-02 ENCOUNTER — Other Ambulatory Visit: Payer: Self-pay

## 2018-12-02 ENCOUNTER — Ambulatory Visit: Payer: PPO | Admitting: Cardiovascular Disease

## 2018-12-02 ENCOUNTER — Encounter: Payer: Self-pay | Admitting: Cardiovascular Disease

## 2018-12-02 VITALS — BP 146/50 | HR 79 | Temp 97.9°F | Ht 72.0 in | Wt 171.0 lb

## 2018-12-02 DIAGNOSIS — I2510 Atherosclerotic heart disease of native coronary artery without angina pectoris: Secondary | ICD-10-CM

## 2018-12-02 DIAGNOSIS — I340 Nonrheumatic mitral (valve) insufficiency: Secondary | ICD-10-CM

## 2018-12-02 DIAGNOSIS — E785 Hyperlipidemia, unspecified: Secondary | ICD-10-CM

## 2018-12-02 DIAGNOSIS — I10 Essential (primary) hypertension: Secondary | ICD-10-CM

## 2018-12-02 NOTE — Progress Notes (Signed)
12/02/2018 [name]   December 19, 1925  [id number]  Primary Physician [name], MD Primary Cardiologist: Lorretta Harp MD Garret Reddish, [place], [place]  HPI:  [name] is a 85 y.o.  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  He has a history of noncritical CAD by catheterization performed by myself, August 15, 2009, with moderate disease in his mid RCA, LAD, and ramus branch, and normal LV function. He did have severe MR secondary to mitral valve prolapse with severe left atrial enlargement. His other problems include hypertension and hyperlipidemia. He was complaining of increasing dyspnea. I brought him in, performing right and left heart catheterization, April 30, 2011, revealing a 50% proximal ramus branch stenosis, 70% mid dominant RCA stenosis just after the takeoff of an acute marginal branch. A TEE performed by Dr. Sallyanne Kuster on April 30, 2011, revealed severe flail mid scallop of the posterior leaflet secondary to ruptured chordae. He was fairly anemic at that time and was seen by Dr. Carol Ada. He underwent upper and lower endoscopy, revealing AVMs. His hemoglobin improved with iron repletion and his symptoms resolved.  He gets his hemoglobin checked frequently and is in the 10 range currently after iron transfusion.  Since I saw him a year ago he is remained stable.  He was working out at [employer] 3 times a week, 25 minutes at a time on the elliptical with weight lifting as well however since COVID-19 he does significantly less exercise, mostly calisthenics at home.  He denies chest pain or shortness of breath.  His last 2D echocardiogram performed 02/22/2016 revealed moderate LV dilatation with normal LV function and moderate MR.   Current Meds  Medication Sig  . acetaminophen (TYLENOL) 325 MG  tablet Take 650 mg by mouth every 6 (six) hours as needed for mild pain or headache.  . allopurinol (ZYLOPRIM) 300 MG tablet Take 150 mg by mouth daily.   [name] aspirin EC 81 MG tablet Take 1 tablet (81 mg total) by mouth daily. Hold aspirin for now, please discuss with Dr Benson Norway to decide on when to resume.  [name] atorvastatin (LIPITOR) 10 MG tablet TAKE 1 TABLET (10 MG TOTAL) BY MOUTH DAILY AT 6 PM.  . Cholecalciferol (VITAMIN D3) 50 MCG (2000 UT) TABS Take 50 mcg by mouth daily.  [name] diltiazem (CARDIZEM CD) 300 MG 24 hr capsule Take 300 mg by mouth daily.  [name] lisinopril (PRINIVIL,ZESTRIL) 40 MG tablet Take 40 mg by mouth daily.  [name] omeprazole (PRILOSEC OTC) 20 MG tablet Take 20 mg by mouth every other day.  . polyethylene glycol (MIRALAX / GLYCOLAX) packet Take 17 g by mouth daily as needed for moderate constipation.      No Known Allergies  Social History   Socioeconomic History  . Marital status: Married    Spouse name: Not on file  . Number of children: Not on file  . Years of education: Not on file  . Highest education level: Not on file  Occupational History  . Not on file  Social Needs  . Financial resource strain: Not on file  . Food insecurity    Worry: Not on file    Inability: Not on file  . Transportation needs    Medical: Not on file    Non-medical: Not on file  Tobacco Use  .  Smoking status: Former Smoker    Years: 20.00    Quit date: 02/08/1961    Years since quitting: 57.8  . Smokeless tobacco: Former Systems developer    Quit date: 02/08/1961  Substance and Sexual Activity  . Alcohol use: Yes    Alcohol/week: 7.0 standard drinks    Types: 7 Standard drinks or equivalent per week    Comment: 2ozs Building services engineer daily  . Drug use: No  . Sexual activity: Not Currently  Lifestyle  . Physical activity    Days per week: Not on file    Minutes per session: Not on file  . Stress: Not on file  Relationships  . Social Herbalist on phone: Not on file    Gets together: Not on file     Attends religious service: Not on file    Active member of club or organization: Not on file    Attends meetings of clubs or organizations: Not on file    Relationship status: Not on file  . Intimate partner violence    Fear of current or ex partner: Not on file    Emotionally abused: Not on file    Physically abused: Not on file    Forced sexual activity: Not on file  Other Topics Concern  . Not on file  Social History Narrative  . Not on file     Review of Systems: General: negative for chills, fever, night sweats or weight changes.  Cardiovascular: negative for chest pain, dyspnea on exertion, edema, orthopnea, palpitations, paroxysmal nocturnal dyspnea or shortness of breath Dermatological: negative for rash Respiratory: negative for cough or wheezing Urologic: negative for hematuria Abdominal: negative for nausea, vomiting, diarrhea, bright red blood per rectum, melena, or hematemesis Neurologic: negative for visual changes, syncope, or dizziness All other systems reviewed and are otherwise negative except as noted above.    Blood pressure (!) 146/50, pulse 79, temperature 97.9 F (36.6 C), height 6' (1.829 m), weight 171 lb (77.6 kg).  General appearance: alert and no distress Neck: no adenopathy, no carotid bruit, no JVD, supple, symmetrical, trachea midline and thyroid not enlarged, symmetric, no tenderness/mass/nodules Lungs: clear to auscultation bilaterally Heart: regular rate and rhythm, S1, S2 normal, no murmur, click, rub or gallop Extremities: extremities normal, atraumatic, no cyanosis or edema Pulses: 2+ and symmetric Skin: Skin color, texture, turgor normal. No rashes or lesions Neurologic: Alert and oriented X 3, normal strength and tone. Normal symmetric reflexes. Normal coordination and gait  EKG sinus rhythm at 79 with first-degree AV block, left axis deviation and low limb voltage.  I personally reviewed this EKG.  ASSESSMENT AND PLAN:   Mitral  regurgitation, severe History of moderate to severe to severe mitral regurg by transesophageal echo performed by Dr. Sallyanne Kuster April 30, 2011 with a flail mid scallop of the posterior leaflet secondary to a ruptured chordae.  His last 2D echo performed 02/22/2016 revealed a moderately dilated left ventricle with normal LV function and moderate MR.  The patient currently does not exercise much but was fairly active prior to COVID-19 exercising at the [facility] 3 3 times a week on the elliptical.  He currently is asymptomatic.  CAD, single vessel RCA History of CAD status post cardiac catheterization performed by myself 08/15/2009 revealing moderate disease in the mid RCA, LAD and ramus branch.  I recatheterized him April 30, 2011 revealing similar anatomy.  He denies chest pain or shortness of breath.  HTN (hypertension) History of essential hypertension with blood pressure  measured today 146/50 he is on diltiazem and Prinivil.  Dyslipidemia History of dyslipidemia on statin therapy with lipid profile performed 08/20/2018 revealing total cholesterol of 107, LDL 41 and HDL 53.      Lorretta Harp MD [facility] 12/02/2018 3:25 PM

## 2018-12-02 NOTE — Assessment & Plan Note (Signed)
History of essential hypertension with blood pressure measured today 146/50 he is on diltiazem and Prinivil.

## 2018-12-02 NOTE — Assessment & Plan Note (Signed)
History of CAD status post cardiac catheterization performed by myself 08/15/2009 revealing moderate disease in the mid RCA, LAD and ramus branch.  I recatheterized him April 30, 2011 revealing similar anatomy.  He denies chest pain or shortness of breath.

## 2018-12-02 NOTE — Patient Instructions (Signed)
Medication Instructions:  Your physician recommends that you continue on your current medications as directed. Please refer to the Current Medication list given to you today.  If you need a refill on your cardiac medications before your next appointment, please call your pharmacy.   Lab work: NONE  Testing/Procedures: NONE  Follow-Up: At [facility], you and your health needs are our priority.  As part of our continuing mission to provide you with exceptional heart care, we have created designated Provider Care Teams.  These Care Teams include your primary Cardiologist (physician) and Advanced Practice Providers (APPs -  Physician Assistants and Nurse Practitioners) who all work together to provide you with the care you need, when you need it. You may see Dr Berry or one of the following Advanced Practice Providers on your designated Care Team:    Luke Kilroy, PA-C  Callie Goodrich, PA-C  Jesse Cleaver, FNP Your physician wants you to follow-up in: 1 year      

## 2018-12-02 NOTE — Assessment & Plan Note (Signed)
History of moderate to severe to severe mitral regurg by transesophageal echo performed by Dr. Sallyanne Kuster April 30, 2011 with a flail mid scallop of the posterior leaflet secondary to a ruptured chordae.  His last 2D echo performed 02/22/2016 revealed a moderately dilated left ventricle with normal LV function and moderate MR.  The patient currently does not exercise much but was fairly active prior to COVID-19 exercising at the [facility] 3 3 times a week on the elliptical.  He currently is asymptomatic.

## 2018-12-02 NOTE — Assessment & Plan Note (Signed)
History of dyslipidemia on statin therapy with lipid profile performed 08/20/2018 revealing total cholesterol of 107, LDL 41 and HDL 53.

## 2018-12-03 ENCOUNTER — Other Ambulatory Visit: Payer: Self-pay | Admitting: Cardiovascular Disease

## 2018-12-03 DIAGNOSIS — N1832 Chronic kidney disease, stage 3b: Secondary | ICD-10-CM | POA: Diagnosis not present

## 2018-12-03 DIAGNOSIS — I2510 Atherosclerotic heart disease of native coronary artery without angina pectoris: Secondary | ICD-10-CM | POA: Diagnosis not present

## 2018-12-03 DIAGNOSIS — E782 Mixed hyperlipidemia: Secondary | ICD-10-CM | POA: Diagnosis not present

## 2018-12-03 DIAGNOSIS — I6521 Occlusion and stenosis of right carotid artery: Secondary | ICD-10-CM | POA: Diagnosis not present

## 2018-12-03 DIAGNOSIS — D500 Iron deficiency anemia secondary to blood loss (chronic): Secondary | ICD-10-CM | POA: Diagnosis not present

## 2018-12-03 DIAGNOSIS — I129 Hypertensive chronic kidney disease with stage 1 through stage 4 chronic kidney disease, or unspecified chronic kidney disease: Secondary | ICD-10-CM | POA: Diagnosis not present

## 2019-01-07 DIAGNOSIS — E782 Mixed hyperlipidemia: Secondary | ICD-10-CM | POA: Diagnosis not present

## 2019-01-07 DIAGNOSIS — I2510 Atherosclerotic heart disease of native coronary artery without angina pectoris: Secondary | ICD-10-CM | POA: Diagnosis not present

## 2019-01-07 DIAGNOSIS — I129 Hypertensive chronic kidney disease with stage 1 through stage 4 chronic kidney disease, or unspecified chronic kidney disease: Secondary | ICD-10-CM | POA: Diagnosis not present

## 2019-01-07 DIAGNOSIS — D500 Iron deficiency anemia secondary to blood loss (chronic): Secondary | ICD-10-CM | POA: Diagnosis not present

## 2019-01-07 DIAGNOSIS — Z0000 Encounter for general adult medical examination without abnormal findings: Secondary | ICD-10-CM | POA: Diagnosis not present

## 2019-01-07 DIAGNOSIS — I6521 Occlusion and stenosis of right carotid artery: Secondary | ICD-10-CM | POA: Diagnosis not present

## 2019-01-11 ENCOUNTER — Ambulatory Visit: Payer: PPO

## 2019-01-11 ENCOUNTER — Ambulatory Visit: Payer: Medicare Other | Attending: Internal Medicine

## 2019-01-11 DIAGNOSIS — Z23 Encounter for immunization: Secondary | ICD-10-CM | POA: Insufficient documentation

## 2019-01-11 NOTE — Progress Notes (Signed)
   Covid-19 Vaccination Clinic  Name:  [name]    MRN: [id number] DOB: 17-Dec-1925  01/11/2019  [name] was observed post Covid-19 immunization for 15 minutes without incidence. He was provided with Vaccine Information Sheet and instruction to access the V-Safe system.   [name] was instructed to call 911 with any severe reactions post vaccine: [name] Difficulty breathing  . Swelling of your face and throat  . A fast heartbeat  . A bad rash all over your body  . Dizziness and weakness    Immunizations Administered    Name Date Dose VIS Date Route   Pfizer COVID-19 Vaccine 01/11/2019  1:26 PM 0.3 mL 12/12/2018 Intramuscular   Manufacturer: [employer], [employer]   Lot: [id number]   [place]: [id number]

## 2019-01-13 DIAGNOSIS — I495 Sick sinus syndrome: Secondary | ICD-10-CM | POA: Diagnosis not present

## 2019-01-13 DIAGNOSIS — I6521 Occlusion and stenosis of right carotid artery: Secondary | ICD-10-CM | POA: Diagnosis not present

## 2019-01-13 DIAGNOSIS — N400 Enlarged prostate without lower urinary tract symptoms: Secondary | ICD-10-CM | POA: Diagnosis not present

## 2019-01-13 DIAGNOSIS — D696 Thrombocytopenia, unspecified: Secondary | ICD-10-CM | POA: Diagnosis not present

## 2019-01-13 DIAGNOSIS — D500 Iron deficiency anemia secondary to blood loss (chronic): Secondary | ICD-10-CM | POA: Diagnosis not present

## 2019-01-13 DIAGNOSIS — Z0000 Encounter for general adult medical examination without abnormal findings: Secondary | ICD-10-CM | POA: Diagnosis not present

## 2019-01-13 DIAGNOSIS — I700 Atherosclerosis of aorta: Secondary | ICD-10-CM | POA: Diagnosis not present

## 2019-01-13 DIAGNOSIS — I2510 Atherosclerotic heart disease of native coronary artery without angina pectoris: Secondary | ICD-10-CM | POA: Diagnosis not present

## 2019-01-13 DIAGNOSIS — I129 Hypertensive chronic kidney disease with stage 1 through stage 4 chronic kidney disease, or unspecified chronic kidney disease: Secondary | ICD-10-CM | POA: Diagnosis not present

## 2019-01-30 DIAGNOSIS — H02105 Unspecified ectropion of left lower eyelid: Secondary | ICD-10-CM | POA: Diagnosis not present

## 2019-01-30 DIAGNOSIS — H52203 Unspecified astigmatism, bilateral: Secondary | ICD-10-CM | POA: Diagnosis not present

## 2019-01-30 DIAGNOSIS — H02102 Unspecified ectropion of right lower eyelid: Secondary | ICD-10-CM | POA: Diagnosis not present

## 2019-01-30 DIAGNOSIS — H10503 Unspecified blepharoconjunctivitis, bilateral: Secondary | ICD-10-CM | POA: Diagnosis not present

## 2019-02-01 ENCOUNTER — Ambulatory Visit: Payer: PPO | Attending: Internal Medicine

## 2019-02-01 ENCOUNTER — Ambulatory Visit: Payer: Self-pay

## 2019-02-01 DIAGNOSIS — Z23 Encounter for immunization: Secondary | ICD-10-CM | POA: Insufficient documentation

## 2019-02-01 NOTE — Progress Notes (Signed)
   Covid-19 Vaccination Clinic  Name:  [name]    MRN: [id number] DOB: 09-Feb-1925  02/01/2019  [name] was observed post Covid-19 immunization for 15 minutes without incidence. He was provided with Vaccine Information Sheet and instruction to access the V-Safe system.   [name] was instructed to call 911 with any severe reactions post vaccine: [name] Difficulty breathing  . Swelling of your face and throat  . A fast heartbeat  . A bad rash all over your body  . Dizziness and weakness    Immunizations Administered    Name Date Dose VIS Date Route   Pfizer COVID-19 Vaccine 02/01/2019  9:17 AM 0.3 mL 12/12/2018 Intramuscular   Manufacturer: [place]   Lot: [id number]   [place]: [id number]

## 2019-02-11 DIAGNOSIS — D500 Iron deficiency anemia secondary to blood loss (chronic): Secondary | ICD-10-CM | POA: Diagnosis not present

## 2019-02-13 IMAGING — CT CT CHEST W/O CM
1 series · 15 of 33 positions shown, 19 images · non-contrast
Comparison: Chest radiographs 10/02/2017, chest CT 02/19/2017 and
earlier.

CLINICAL DATA: [AGE] female with right upper lobe lung nodule
on CT in [REDACTED]. Shortness of breath, former smoker
COPD. Prior left lower lobectomy.

EXAM:
CT CHEST WITHOUT CONTRAST
TECHNIQUE: Multidetector CT imaging of the chest was performed following the
standard protocol without IV contrast.

[Series 2: chest w/(date) · axial · 0.74mm/px · z∈[-354,-56]mm · 15 of 177 slices shown, 19 images]
[im 14/177  mediastinal]
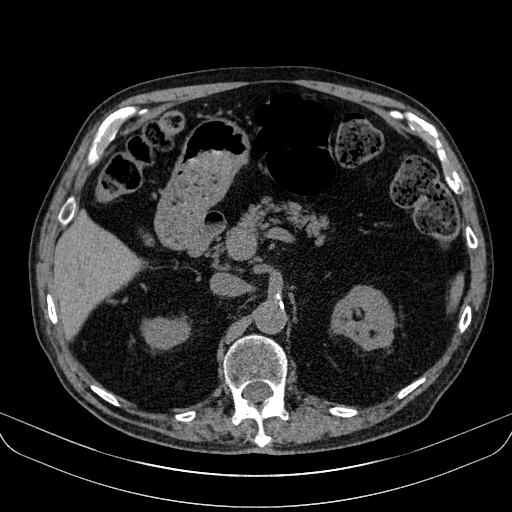
[im 14/177  lung]
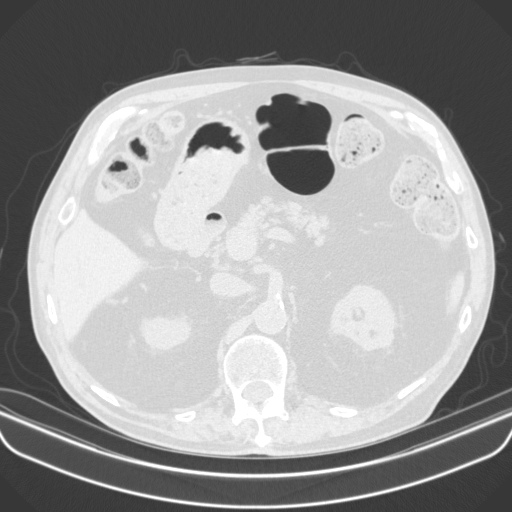
[im 27/177  lung]
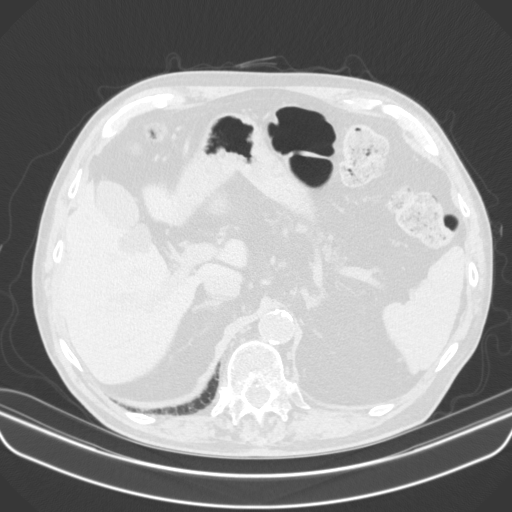
[im 36/177  lung]
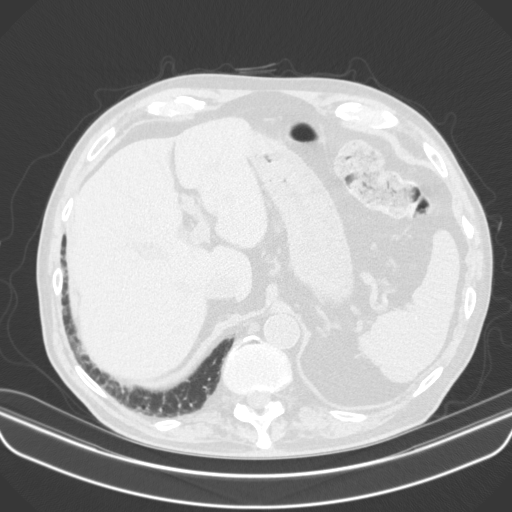
[im 46/177  lung]
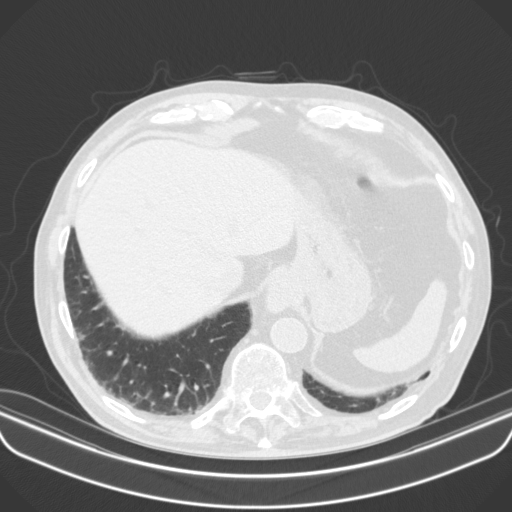
[im 59/177  mediastinal]
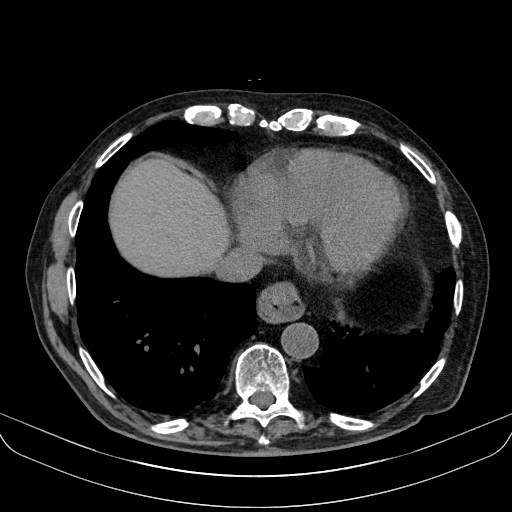
[im 59/177  lung]
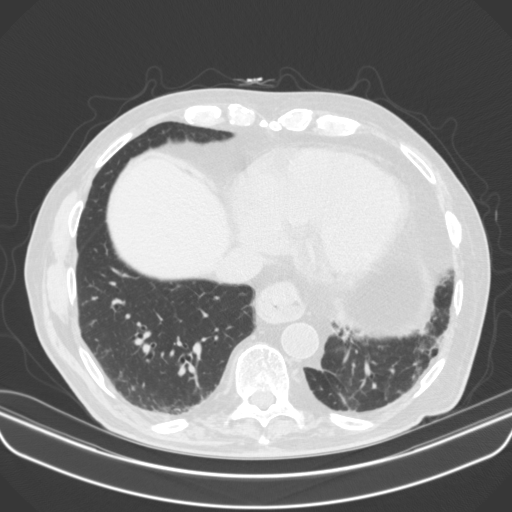
[im 71/177  lung]
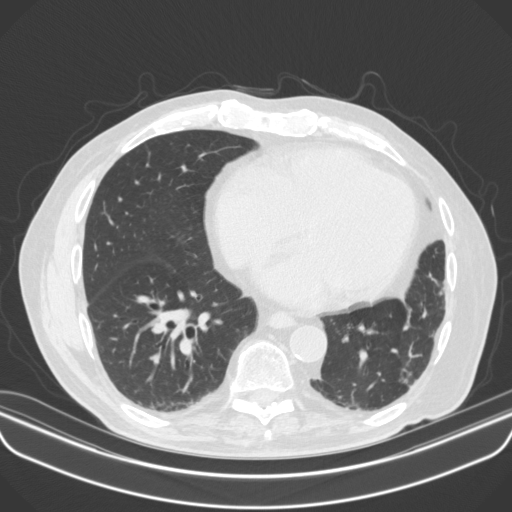
[im 79/177  lung]
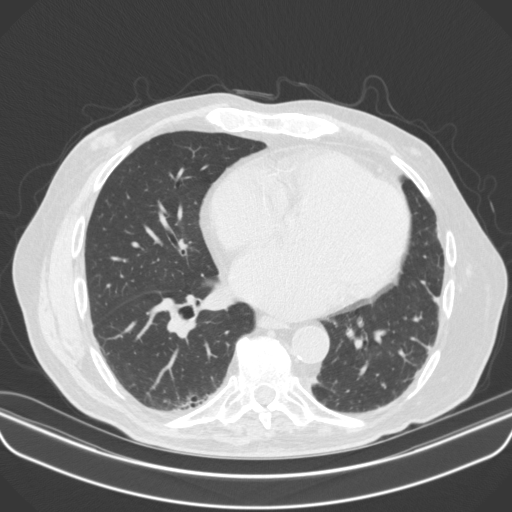
[im 92/177  lung]
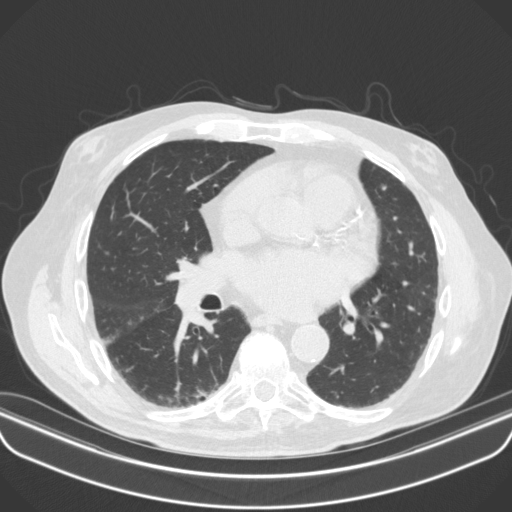
[im 98/177  mediastinal]
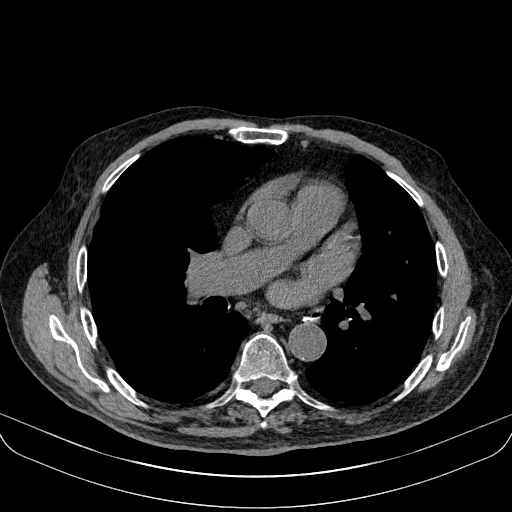
[im 98/177  lung]
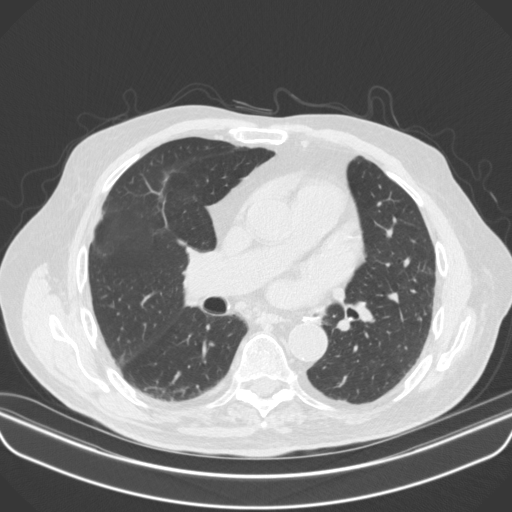
[im 106/177  lung]
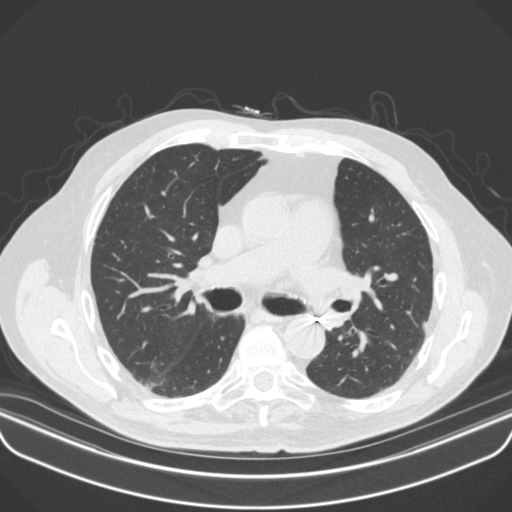
[im 118/177  lung]
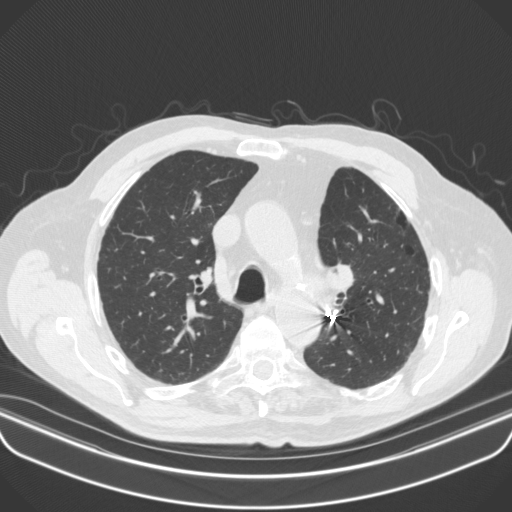
[im 131/177  lung]
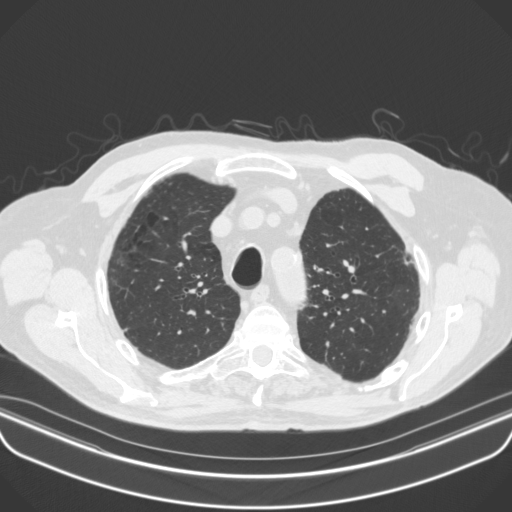
[im 141/177  mediastinal]
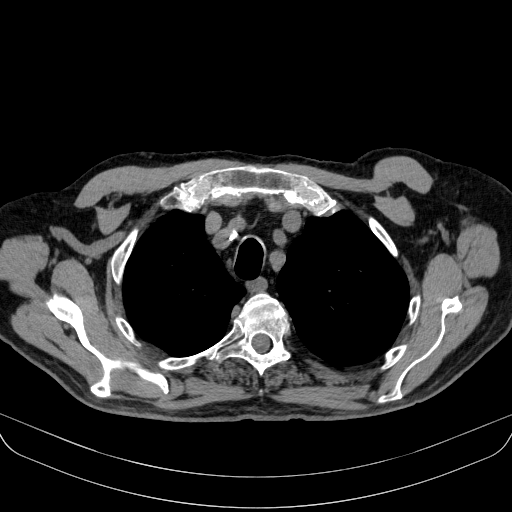
[im 141/177  lung]
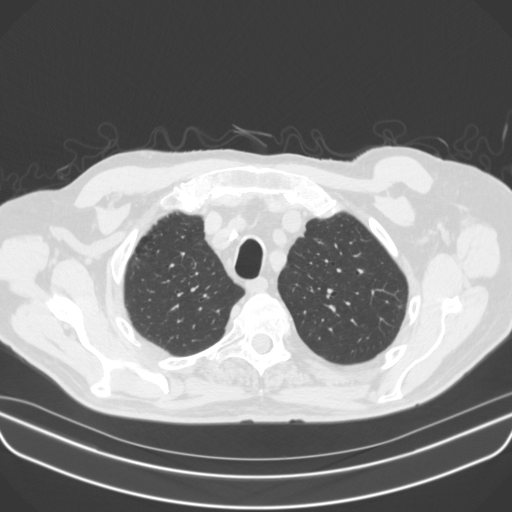
[im 150/177  lung]
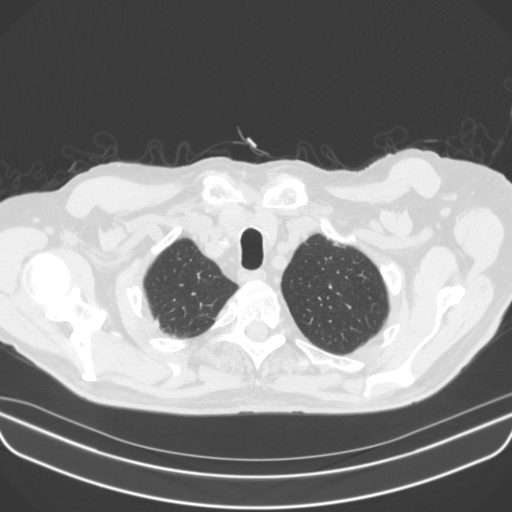
[im 163/177  lung]
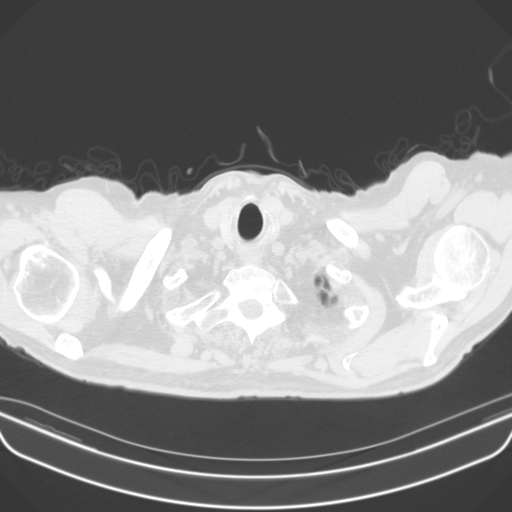

[15 of 33 positions shown; findings below may reference images not displayed]

FINDINGS: Cardiovascular: Chronic surgical clips at the left hilum. Calcified
coronary artery atherosclerosis. Stable cardiomegaly. No pericardial
effusion. Mild calcified atherosclerosis of the thoracic aorta, more
moderate in the visible upper abdomen. Vascular patency is not
evaluated in the absence of IV contrast.

Mediastinum/Nodes: Negative, no lymphadenopathy. There is a small to
moderate chronic gastric hiatal hernia.

Lungs/Pleura: The 6-7 millimeter right upper lobe lung nodule seen
on 02/19/2017 has resolved and was inflammatory.

Additionally, the multifocal left lung and right lower lobe
peripheral, nodular and ground-glass opacity seen in [REDACTED] has
resolved. Underlying scattered bilateral upper lobe paraseptal
emphysema and mild associated scarring. Similar scarring in the
costophrenic angles is stable.

No new pulmonary opacity.

Major airways are stable.  No pleural effusion.

Upper Abdomen: Stable visible noncontrast liver, gallbladder,
spleen, pancreas, adrenal glands, kidneys, and bowel in the upper
abdomen.

Musculoskeletal: Stable visualized osseous structures.
IMPRESSION: 1. Resolved right upper lobe lung nodule since 02/19/2017. Resolved
additional bilateral inflammatory appearing opacity since that time.
2. Stable underlying areas of Emphysema (CRPOV-UJ3.R) and scarring.
Stable postoperative changes. No new pulmonary abnormality.
3. Stable cardiomegaly. Calcified coronary artery and Aortic
Atherosclerosis (CRPOV-VM1.1).
4. Stable visible abdomen including small to moderate hiatal hernia.

## 2019-03-11 DIAGNOSIS — D500 Iron deficiency anemia secondary to blood loss (chronic): Secondary | ICD-10-CM | POA: Diagnosis not present

## 2019-04-08 DIAGNOSIS — D500 Iron deficiency anemia secondary to blood loss (chronic): Secondary | ICD-10-CM | POA: Diagnosis not present

## 2019-04-14 DIAGNOSIS — N1832 Chronic kidney disease, stage 3b: Secondary | ICD-10-CM | POA: Diagnosis not present

## 2019-04-14 DIAGNOSIS — I129 Hypertensive chronic kidney disease with stage 1 through stage 4 chronic kidney disease, or unspecified chronic kidney disease: Secondary | ICD-10-CM | POA: Diagnosis not present

## 2019-04-14 DIAGNOSIS — I2510 Atherosclerotic heart disease of native coronary artery without angina pectoris: Secondary | ICD-10-CM | POA: Diagnosis not present

## 2019-04-14 DIAGNOSIS — E782 Mixed hyperlipidemia: Secondary | ICD-10-CM | POA: Diagnosis not present

## 2019-04-14 DIAGNOSIS — D500 Iron deficiency anemia secondary to blood loss (chronic): Secondary | ICD-10-CM | POA: Diagnosis not present

## 2019-05-13 DIAGNOSIS — D500 Iron deficiency anemia secondary to blood loss (chronic): Secondary | ICD-10-CM | POA: Diagnosis not present

## 2019-05-13 DIAGNOSIS — N1832 Chronic kidney disease, stage 3b: Secondary | ICD-10-CM | POA: Diagnosis not present

## 2019-06-17 DIAGNOSIS — D500 Iron deficiency anemia secondary to blood loss (chronic): Secondary | ICD-10-CM | POA: Diagnosis not present

## 2019-06-17 DIAGNOSIS — N1832 Chronic kidney disease, stage 3b: Secondary | ICD-10-CM | POA: Diagnosis not present

## 2019-06-19 DIAGNOSIS — L8990 Pressure ulcer of unspecified site, unspecified stage: Secondary | ICD-10-CM | POA: Diagnosis not present

## 2019-06-19 DIAGNOSIS — L308 Other specified dermatitis: Secondary | ICD-10-CM | POA: Diagnosis not present

## 2019-06-19 DIAGNOSIS — X32XXXD Exposure to sunlight, subsequent encounter: Secondary | ICD-10-CM | POA: Diagnosis not present

## 2019-06-19 DIAGNOSIS — L570 Actinic keratosis: Secondary | ICD-10-CM | POA: Diagnosis not present

## 2019-07-15 DIAGNOSIS — D500 Iron deficiency anemia secondary to blood loss (chronic): Secondary | ICD-10-CM | POA: Diagnosis not present

## 2019-07-15 DIAGNOSIS — N1832 Chronic kidney disease, stage 3b: Secondary | ICD-10-CM | POA: Diagnosis not present

## 2019-07-20 DIAGNOSIS — D500 Iron deficiency anemia secondary to blood loss (chronic): Secondary | ICD-10-CM | POA: Diagnosis not present

## 2019-07-22 DIAGNOSIS — D500 Iron deficiency anemia secondary to blood loss (chronic): Secondary | ICD-10-CM | POA: Diagnosis not present

## 2019-07-22 DIAGNOSIS — E782 Mixed hyperlipidemia: Secondary | ICD-10-CM | POA: Diagnosis not present

## 2019-07-22 DIAGNOSIS — I129 Hypertensive chronic kidney disease with stage 1 through stage 4 chronic kidney disease, or unspecified chronic kidney disease: Secondary | ICD-10-CM | POA: Diagnosis not present

## 2019-07-22 DIAGNOSIS — I2510 Atherosclerotic heart disease of native coronary artery without angina pectoris: Secondary | ICD-10-CM | POA: Diagnosis not present

## 2019-07-27 DIAGNOSIS — D500 Iron deficiency anemia secondary to blood loss (chronic): Secondary | ICD-10-CM | POA: Diagnosis not present

## 2019-08-26 DIAGNOSIS — I129 Hypertensive chronic kidney disease with stage 1 through stage 4 chronic kidney disease, or unspecified chronic kidney disease: Secondary | ICD-10-CM | POA: Diagnosis not present

## 2019-08-26 DIAGNOSIS — E782 Mixed hyperlipidemia: Secondary | ICD-10-CM | POA: Diagnosis not present

## 2019-08-26 DIAGNOSIS — D500 Iron deficiency anemia secondary to blood loss (chronic): Secondary | ICD-10-CM | POA: Diagnosis not present

## 2019-08-26 DIAGNOSIS — I2510 Atherosclerotic heart disease of native coronary artery without angina pectoris: Secondary | ICD-10-CM | POA: Diagnosis not present

## 2019-09-08 DIAGNOSIS — D500 Iron deficiency anemia secondary to blood loss (chronic): Secondary | ICD-10-CM | POA: Diagnosis not present

## 2019-09-23 DIAGNOSIS — D500 Iron deficiency anemia secondary to blood loss (chronic): Secondary | ICD-10-CM | POA: Diagnosis not present

## 2019-09-23 DIAGNOSIS — E782 Mixed hyperlipidemia: Secondary | ICD-10-CM | POA: Diagnosis not present

## 2019-09-23 DIAGNOSIS — I2510 Atherosclerotic heart disease of native coronary artery without angina pectoris: Secondary | ICD-10-CM | POA: Diagnosis not present

## 2019-09-23 DIAGNOSIS — I129 Hypertensive chronic kidney disease with stage 1 through stage 4 chronic kidney disease, or unspecified chronic kidney disease: Secondary | ICD-10-CM | POA: Diagnosis not present

## 2019-09-30 DIAGNOSIS — Z23 Encounter for immunization: Secondary | ICD-10-CM | POA: Diagnosis not present

## 2019-09-30 DIAGNOSIS — E782 Mixed hyperlipidemia: Secondary | ICD-10-CM | POA: Diagnosis not present

## 2019-09-30 DIAGNOSIS — D500 Iron deficiency anemia secondary to blood loss (chronic): Secondary | ICD-10-CM | POA: Diagnosis not present

## 2019-09-30 DIAGNOSIS — I129 Hypertensive chronic kidney disease with stage 1 through stage 4 chronic kidney disease, or unspecified chronic kidney disease: Secondary | ICD-10-CM | POA: Diagnosis not present

## 2019-09-30 DIAGNOSIS — I2510 Atherosclerotic heart disease of native coronary artery without angina pectoris: Secondary | ICD-10-CM | POA: Diagnosis not present

## 2019-10-21 DIAGNOSIS — D500 Iron deficiency anemia secondary to blood loss (chronic): Secondary | ICD-10-CM | POA: Diagnosis not present

## 2019-10-21 DIAGNOSIS — I2510 Atherosclerotic heart disease of native coronary artery without angina pectoris: Secondary | ICD-10-CM | POA: Diagnosis not present

## 2019-10-21 DIAGNOSIS — I129 Hypertensive chronic kidney disease with stage 1 through stage 4 chronic kidney disease, or unspecified chronic kidney disease: Secondary | ICD-10-CM | POA: Diagnosis not present

## 2019-10-21 DIAGNOSIS — E782 Mixed hyperlipidemia: Secondary | ICD-10-CM | POA: Diagnosis not present

## 2019-11-16 DIAGNOSIS — E782 Mixed hyperlipidemia: Secondary | ICD-10-CM | POA: Diagnosis not present

## 2019-11-16 DIAGNOSIS — I129 Hypertensive chronic kidney disease with stage 1 through stage 4 chronic kidney disease, or unspecified chronic kidney disease: Secondary | ICD-10-CM | POA: Diagnosis not present

## 2019-11-16 DIAGNOSIS — D500 Iron deficiency anemia secondary to blood loss (chronic): Secondary | ICD-10-CM | POA: Diagnosis not present

## 2019-11-16 DIAGNOSIS — I2510 Atherosclerotic heart disease of native coronary artery without angina pectoris: Secondary | ICD-10-CM | POA: Diagnosis not present

## 2019-11-18 DIAGNOSIS — D500 Iron deficiency anemia secondary to blood loss (chronic): Secondary | ICD-10-CM | POA: Diagnosis not present

## 2019-11-18 DIAGNOSIS — I129 Hypertensive chronic kidney disease with stage 1 through stage 4 chronic kidney disease, or unspecified chronic kidney disease: Secondary | ICD-10-CM | POA: Diagnosis not present

## 2019-11-18 DIAGNOSIS — I2510 Atherosclerotic heart disease of native coronary artery without angina pectoris: Secondary | ICD-10-CM | POA: Diagnosis not present

## 2019-11-18 DIAGNOSIS — E782 Mixed hyperlipidemia: Secondary | ICD-10-CM | POA: Diagnosis not present

## 2019-11-19 DIAGNOSIS — D500 Iron deficiency anemia secondary to blood loss (chronic): Secondary | ICD-10-CM | POA: Diagnosis not present

## 2019-11-30 DIAGNOSIS — D500 Iron deficiency anemia secondary to blood loss (chronic): Secondary | ICD-10-CM | POA: Diagnosis not present

## 2019-12-02 DIAGNOSIS — E872 Acidosis: Secondary | ICD-10-CM | POA: Diagnosis not present

## 2019-12-02 DIAGNOSIS — I129 Hypertensive chronic kidney disease with stage 1 through stage 4 chronic kidney disease, or unspecified chronic kidney disease: Secondary | ICD-10-CM | POA: Diagnosis not present

## 2019-12-02 DIAGNOSIS — R809 Proteinuria, unspecified: Secondary | ICD-10-CM | POA: Diagnosis not present

## 2019-12-02 DIAGNOSIS — N183 Chronic kidney disease, stage 3 unspecified: Secondary | ICD-10-CM | POA: Diagnosis not present

## 2019-12-02 DIAGNOSIS — N400 Enlarged prostate without lower urinary tract symptoms: Secondary | ICD-10-CM | POA: Diagnosis not present

## 2019-12-16 DIAGNOSIS — E782 Mixed hyperlipidemia: Secondary | ICD-10-CM | POA: Diagnosis not present

## 2019-12-16 DIAGNOSIS — I129 Hypertensive chronic kidney disease with stage 1 through stage 4 chronic kidney disease, or unspecified chronic kidney disease: Secondary | ICD-10-CM | POA: Diagnosis not present

## 2019-12-16 DIAGNOSIS — D500 Iron deficiency anemia secondary to blood loss (chronic): Secondary | ICD-10-CM | POA: Diagnosis not present

## 2019-12-16 DIAGNOSIS — I2510 Atherosclerotic heart disease of native coronary artery without angina pectoris: Secondary | ICD-10-CM | POA: Diagnosis not present

## 2020-01-15 ENCOUNTER — Other Ambulatory Visit: Payer: Self-pay | Admitting: Cardiovascular Disease

## 2020-01-21 DIAGNOSIS — D500 Iron deficiency anemia secondary to blood loss (chronic): Secondary | ICD-10-CM | POA: Diagnosis not present

## 2020-01-21 DIAGNOSIS — I2510 Atherosclerotic heart disease of native coronary artery without angina pectoris: Secondary | ICD-10-CM | POA: Diagnosis not present

## 2020-01-21 DIAGNOSIS — I129 Hypertensive chronic kidney disease with stage 1 through stage 4 chronic kidney disease, or unspecified chronic kidney disease: Secondary | ICD-10-CM | POA: Diagnosis not present

## 2020-01-21 DIAGNOSIS — E782 Mixed hyperlipidemia: Secondary | ICD-10-CM | POA: Diagnosis not present

## 2020-01-22 ENCOUNTER — Encounter: Payer: Self-pay | Admitting: Cardiovascular Disease

## 2020-01-22 ENCOUNTER — Other Ambulatory Visit: Payer: Self-pay

## 2020-01-22 ENCOUNTER — Ambulatory Visit: Payer: PPO | Admitting: Cardiovascular Disease

## 2020-01-22 VITALS — BP 168/70 | HR 82 | Ht 72.0 in | Wt 171.6 lb

## 2020-01-22 DIAGNOSIS — I2510 Atherosclerotic heart disease of native coronary artery without angina pectoris: Secondary | ICD-10-CM

## 2020-01-22 DIAGNOSIS — E785 Hyperlipidemia, unspecified: Secondary | ICD-10-CM | POA: Diagnosis not present

## 2020-01-22 DIAGNOSIS — I340 Nonrheumatic mitral (valve) insufficiency: Secondary | ICD-10-CM

## 2020-01-22 DIAGNOSIS — I10 Essential (primary) hypertension: Secondary | ICD-10-CM | POA: Diagnosis not present

## 2020-01-22 NOTE — Assessment & Plan Note (Signed)
History of severe mitral regurgitation in the past however his most recent 2D echo performed 02/22/2016 revealed normal LV systolic function with moderate MR directed anteriorly and severe left atrial enlargement.  PA pressure was 41.  He does exercise a half an hour a day with his wife without limitation or restriction.  At his age, I do not feel compelled to repeat a 2D echocardiogram.  I cannot hear a murmur on exam today.

## 2020-01-22 NOTE — Assessment & Plan Note (Signed)
History of essential hypertension a blood pressure measured today at 168/70.  He does keep an accurate log of his blood pressure at home which I have reviewed and they are all in the normal range.  He is on lisinopril 20 mg a day and diltiazem.

## 2020-01-22 NOTE — Patient Instructions (Signed)
Medication Instructions:  No Changes In Medications at this time.  *If you need a refill on your cardiac medications before your next appointment, please call your pharmacy*  Follow-Up: At [facility], you and your health needs are our priority.  As part of our continuing mission to provide you with exceptional heart care, we have created designated Provider Care Teams.  These Care Teams include your primary Cardiologist (physician) and Advanced Practice Providers (APPs -  Physician Assistants and Nurse Practitioners) who all work together to provide you with the care you need, when you need it.  Your next appointment:   1 year(s)  The format for your next appointment:   In Person  Provider:   Quay Burow, MD

## 2020-01-22 NOTE — Assessment & Plan Note (Addendum)
History of dyslipidemia on statin therapy with lipid profile performed 04/08/2019 revealing a total cholesterol of 129, LDL 51 and HDL of 61.  Lipid profile performed 01/21/2020 revealed a total cholesterol of 132, LDL of 56 and HDL of 60.

## 2020-01-22 NOTE — Assessment & Plan Note (Signed)
History of CAD at last cath performed 04/30/2011 revealing of 50% proximal ramus branch stenosis and a 70% mid dominant RCA stenosis after the takeoff of an acute marginal branch.  He is completely asymptomatic.  He has cut back his aspirin from daily to twice a week because of iron deficiency anemia

## 2020-01-22 NOTE — Progress Notes (Addendum)
02/05/2020 [name]   1925-08-08  [id number]  Primary Physician [name], MD Primary Cardiologist: Lorretta Harp MD Garret Reddish, [place], [place]  HPI:  [name] is a 87 y.o.   thin-appearing married Caucasian male, father of 2, grandfather to 2 grandchildren, whoI last saw office  12/02/2018. He is the retired Dance movement psychotherapist at [employer] and is very detail oriented.  He has a history of noncritical CAD by catheterization performed by myself, August 15, 2009, with moderate disease in his mid RCA, LAD, and ramus branch, and normal LV function. He did have severe MR secondary to mitral valve prolapse with severe left atrial enlargement. His other problems include hypertension and hyperlipidemia. He was complaining of increasing dyspnea. I brought him in, performing right and left heart catheterization, April 30, 2011, revealing a 50% proximal ramus branch stenosis, 70% mid dominant RCA stenosis just after the takeoff of an acute marginal branch. A TEE performed by Dr. Sallyanne Kuster on April 30, 2011, revealed severe flail mid scallop of the posterior leaflet secondary to ruptured chordae. He was fairly anemic at that time and was seen by Dr. Carol Ada. He underwent upper and lower endoscopy, revealing AVMs. His hemoglobin improved with iron repletion and his symptoms resolved.  He gets his hemoglobin checked frequently and is in the 10 range currently after iron transfusion.  Since I saw him a year ago he is remained stable.    He and his wife work out at home for half an hour a day.  [facility]  He works out on a bicycle because of bad knees.  He continues to get iron transfusions because of iron deficiency anemia presumably from AVMs.  He checks his blood pressure daily and they are all in the normal range.  He denies chest pain or shortness of breath.   Current Meds  Medication Sig  . acetaminophen (TYLENOL) 325 MG  tablet Take 650 mg by mouth every 6 (six) hours as needed for mild pain or headache.  . allopurinol (ZYLOPRIM) 300 MG tablet Take 150 mg by mouth daily.   [name] aspirin EC 81 MG tablet Take 1 tablet (81 mg total) by mouth daily. Hold aspirin for now, please discuss with Dr Benson Norway to decide on when to resume.  [name] atorvastatin (LIPITOR) 10 MG tablet TAKE 1 TABLET (10 MG TOTAL) BY MOUTH DAILY AT 6 PM.  . Cholecalciferol (VITAMIN D3) 50 MCG (2000 UT) TABS Take 50 mcg by mouth daily.  [name] diltiazem (CARDIZEM CD) 300 MG 24 hr capsule Take 300 mg by mouth daily.  [name] lisinopril (ZESTRIL) 20 MG tablet Take 20 mg by mouth daily.  [name] omeprazole (PRILOSEC OTC) 20 MG tablet Take 20 mg by mouth every other day.  . polyethylene glycol (MIRALAX / GLYCOLAX) packet Take 17 g by mouth daily as needed for moderate constipation.      No Known Allergies  Social History   Socioeconomic History  . Marital status: Married    Spouse name: Not on file  . Number of children: Not on file  . Years of education: Not on file  . Highest education level: Not on file  Occupational History  . Not on file  Tobacco Use  . Smoking status: Former Smoker    Years: 20.00    Quit date: 02/08/1961    Years since quitting: 59.0  . Smokeless tobacco: Former Leisure centre manager  date: 02/08/1961  Vaping Use  . Vaping Use: Never used  Substance and Sexual Activity  . Alcohol use: Yes    Alcohol/week: 7.0 standard drinks    Types: 7 Standard drinks or equivalent per week    Comment: 2ozs Building services engineer daily  . Drug use: No  . Sexual activity: Not Currently  Other Topics Concern  . Not on file  Social History Narrative  . Not on file   Social Determinants of Health   Financial Resource Strain: Not on file  Food Insecurity: Not on file  Transportation Needs: Not on file  Physical Activity: Not on file  Stress: Not on file  Social Connections: Not on file  Intimate Partner Violence: Not on file     Review of Systems: General: negative for  chills, fever, night sweats or weight changes.  Cardiovascular: negative for chest pain, dyspnea on exertion, edema, orthopnea, palpitations, paroxysmal nocturnal dyspnea or shortness of breath Dermatological: negative for rash Respiratory: negative for cough or wheezing Urologic: negative for hematuria Abdominal: negative for nausea, vomiting, diarrhea, bright red blood per rectum, melena, or hematemesis Neurologic: negative for visual changes, syncope, or dizziness All other systems reviewed and are otherwise negative except as noted above.    Blood pressure (!) 168/70, pulse 82, height 6' (1.829 m), weight 171 lb 9.6 oz (77.8 kg).  General appearance: alert and no distress Neck: no adenopathy, no carotid bruit, no JVD, supple, symmetrical, trachea midline and thyroid not enlarged, symmetric, no tenderness/mass/nodules Lungs: clear to auscultation bilaterally Heart: regular rate and rhythm, S1, S2 normal, no murmur, click, rub or gallop Extremities: extremities normal, atraumatic, no cyanosis or edema Pulses: 2+ and symmetric Skin: Skin color, texture, turgor normal. No rashes or lesions Neurologic: Alert and oriented X 3, normal strength and tone. Normal symmetric reflexes. Normal coordination and gait  EKG sinus rhythm at 82 with left axis deviation and first-degree AV block.  I personally reviewed this EKG.  ASSESSMENT AND PLAN:   Mitral regurgitation, severe History of severe mitral regurgitation in the past however his most recent 2D echo performed 02/22/2016 revealed normal LV systolic function with moderate MR directed anteriorly and severe left atrial enlargement.  PA pressure was 41.  He does exercise a half an hour a day with his wife without limitation or restriction.  At his age, I do not feel compelled to repeat a 2D echocardiogram.  I cannot hear a murmur on exam today.  CAD, single vessel RCA History of CAD at last cath performed 04/30/2011 revealing of 50% proximal ramus  branch stenosis and a 70% mid dominant RCA stenosis after the takeoff of an acute marginal branch.  He is completely asymptomatic.  He has cut back his aspirin from daily to twice a week because of iron deficiency anemia  HTN (hypertension) History of essential hypertension a blood pressure measured today at 168/70.  He does keep an accurate log of his blood pressure at home which I have reviewed and they are all in the normal range.  He is on lisinopril 20 mg a day and diltiazem.  Dyslipidemia History of dyslipidemia on statin therapy with lipid profile performed 04/08/2019 revealing a total cholesterol of 129, LDL 51 and HDL of 61.  Lipid profile performed 01/21/2020 revealed a total cholesterol of 132, LDL of 56 and HDL of 60.      Lorretta Harp MD FACP,FACC,FAHA, [facility] 02/05/2020 4:31 PM

## 2020-01-27 DIAGNOSIS — I495 Sick sinus syndrome: Secondary | ICD-10-CM | POA: Diagnosis not present

## 2020-01-27 DIAGNOSIS — N1832 Chronic kidney disease, stage 3b: Secondary | ICD-10-CM | POA: Diagnosis not present

## 2020-01-27 DIAGNOSIS — R7303 Prediabetes: Secondary | ICD-10-CM | POA: Diagnosis not present

## 2020-01-27 DIAGNOSIS — Z23 Encounter for immunization: Secondary | ICD-10-CM | POA: Diagnosis not present

## 2020-01-27 DIAGNOSIS — E782 Mixed hyperlipidemia: Secondary | ICD-10-CM | POA: Diagnosis not present

## 2020-01-27 DIAGNOSIS — I6521 Occlusion and stenosis of right carotid artery: Secondary | ICD-10-CM | POA: Diagnosis not present

## 2020-01-27 DIAGNOSIS — I700 Atherosclerosis of aorta: Secondary | ICD-10-CM | POA: Diagnosis not present

## 2020-01-27 DIAGNOSIS — I2510 Atherosclerotic heart disease of native coronary artery without angina pectoris: Secondary | ICD-10-CM | POA: Diagnosis not present

## 2020-01-27 DIAGNOSIS — M810 Age-related osteoporosis without current pathological fracture: Secondary | ICD-10-CM | POA: Diagnosis not present

## 2020-01-27 DIAGNOSIS — Z0000 Encounter for general adult medical examination without abnormal findings: Secondary | ICD-10-CM | POA: Diagnosis not present

## 2020-01-27 DIAGNOSIS — D500 Iron deficiency anemia secondary to blood loss (chronic): Secondary | ICD-10-CM | POA: Diagnosis not present

## 2020-01-27 DIAGNOSIS — I129 Hypertensive chronic kidney disease with stage 1 through stage 4 chronic kidney disease, or unspecified chronic kidney disease: Secondary | ICD-10-CM | POA: Diagnosis not present

## 2020-02-10 DIAGNOSIS — H02102 Unspecified ectropion of right lower eyelid: Secondary | ICD-10-CM | POA: Diagnosis not present

## 2020-02-10 DIAGNOSIS — H52203 Unspecified astigmatism, bilateral: Secondary | ICD-10-CM | POA: Diagnosis not present

## 2020-02-10 DIAGNOSIS — H02105 Unspecified ectropion of left lower eyelid: Secondary | ICD-10-CM | POA: Diagnosis not present

## 2020-02-10 DIAGNOSIS — H04123 Dry eye syndrome of bilateral lacrimal glands: Secondary | ICD-10-CM | POA: Diagnosis not present

## 2020-02-24 DIAGNOSIS — D500 Iron deficiency anemia secondary to blood loss (chronic): Secondary | ICD-10-CM | POA: Diagnosis not present

## 2020-03-23 DIAGNOSIS — E782 Mixed hyperlipidemia: Secondary | ICD-10-CM | POA: Diagnosis not present

## 2020-03-23 DIAGNOSIS — D500 Iron deficiency anemia secondary to blood loss (chronic): Secondary | ICD-10-CM | POA: Diagnosis not present

## 2020-03-23 DIAGNOSIS — I129 Hypertensive chronic kidney disease with stage 1 through stage 4 chronic kidney disease, or unspecified chronic kidney disease: Secondary | ICD-10-CM | POA: Diagnosis not present

## 2020-03-24 DIAGNOSIS — D500 Iron deficiency anemia secondary to blood loss (chronic): Secondary | ICD-10-CM | POA: Diagnosis not present

## 2020-03-30 DIAGNOSIS — N1832 Chronic kidney disease, stage 3b: Secondary | ICD-10-CM | POA: Diagnosis not present

## 2020-03-30 DIAGNOSIS — D500 Iron deficiency anemia secondary to blood loss (chronic): Secondary | ICD-10-CM | POA: Diagnosis not present

## 2020-03-30 DIAGNOSIS — I10 Essential (primary) hypertension: Secondary | ICD-10-CM | POA: Diagnosis not present

## 2020-03-31 DIAGNOSIS — D500 Iron deficiency anemia secondary to blood loss (chronic): Secondary | ICD-10-CM | POA: Diagnosis not present

## 2020-04-17 ENCOUNTER — Other Ambulatory Visit: Payer: Self-pay | Admitting: Cardiovascular Disease

## 2020-04-18 NOTE — Telephone Encounter (Signed)
Rx has been sent to the pharmacy electronically. ° °

## 2020-05-04 DIAGNOSIS — N1832 Chronic kidney disease, stage 3b: Secondary | ICD-10-CM | POA: Diagnosis not present

## 2020-05-04 DIAGNOSIS — D500 Iron deficiency anemia secondary to blood loss (chronic): Secondary | ICD-10-CM | POA: Diagnosis not present

## 2020-05-04 DIAGNOSIS — I10 Essential (primary) hypertension: Secondary | ICD-10-CM | POA: Diagnosis not present

## 2020-06-01 DIAGNOSIS — D500 Iron deficiency anemia secondary to blood loss (chronic): Secondary | ICD-10-CM | POA: Diagnosis not present

## 2020-06-01 DIAGNOSIS — I10 Essential (primary) hypertension: Secondary | ICD-10-CM | POA: Diagnosis not present

## 2020-06-01 DIAGNOSIS — N1832 Chronic kidney disease, stage 3b: Secondary | ICD-10-CM | POA: Diagnosis not present

## 2020-06-08 DIAGNOSIS — N1832 Chronic kidney disease, stage 3b: Secondary | ICD-10-CM | POA: Diagnosis not present

## 2020-06-08 DIAGNOSIS — D500 Iron deficiency anemia secondary to blood loss (chronic): Secondary | ICD-10-CM | POA: Diagnosis not present

## 2020-06-08 DIAGNOSIS — E782 Mixed hyperlipidemia: Secondary | ICD-10-CM | POA: Diagnosis not present

## 2020-06-08 DIAGNOSIS — I129 Hypertensive chronic kidney disease with stage 1 through stage 4 chronic kidney disease, or unspecified chronic kidney disease: Secondary | ICD-10-CM | POA: Diagnosis not present

## 2020-06-08 DIAGNOSIS — D696 Thrombocytopenia, unspecified: Secondary | ICD-10-CM | POA: Diagnosis not present

## 2020-06-08 DIAGNOSIS — I10 Essential (primary) hypertension: Secondary | ICD-10-CM | POA: Diagnosis not present

## 2020-07-06 DIAGNOSIS — D500 Iron deficiency anemia secondary to blood loss (chronic): Secondary | ICD-10-CM | POA: Diagnosis not present

## 2020-07-06 DIAGNOSIS — E782 Mixed hyperlipidemia: Secondary | ICD-10-CM | POA: Diagnosis not present

## 2020-07-06 DIAGNOSIS — D696 Thrombocytopenia, unspecified: Secondary | ICD-10-CM | POA: Diagnosis not present

## 2020-07-06 DIAGNOSIS — N1832 Chronic kidney disease, stage 3b: Secondary | ICD-10-CM | POA: Diagnosis not present

## 2020-07-06 DIAGNOSIS — I129 Hypertensive chronic kidney disease with stage 1 through stage 4 chronic kidney disease, or unspecified chronic kidney disease: Secondary | ICD-10-CM | POA: Diagnosis not present

## 2020-07-20 DIAGNOSIS — R5383 Other fatigue: Secondary | ICD-10-CM | POA: Diagnosis not present

## 2020-07-20 DIAGNOSIS — D500 Iron deficiency anemia secondary to blood loss (chronic): Secondary | ICD-10-CM | POA: Diagnosis not present

## 2020-07-22 ENCOUNTER — Telehealth: Payer: Self-pay | Admitting: Hematology

## 2020-07-22 NOTE — Telephone Encounter (Signed)
Scheduled appt per referral. Pt aware.  

## 2020-08-08 ENCOUNTER — Encounter: Payer: PPO | Admitting: Hematology

## 2020-08-09 ENCOUNTER — Inpatient Hospital Stay: Payer: PPO | Admitting: Hematology

## 2020-08-09 DIAGNOSIS — I10 Essential (primary) hypertension: Secondary | ICD-10-CM | POA: Diagnosis not present

## 2020-08-09 DIAGNOSIS — D500 Iron deficiency anemia secondary to blood loss (chronic): Secondary | ICD-10-CM | POA: Diagnosis not present

## 2020-08-10 ENCOUNTER — Other Ambulatory Visit: Payer: Self-pay

## 2020-08-10 ENCOUNTER — Encounter (HOSPITAL_COMMUNITY): Payer: Self-pay

## 2020-08-10 ENCOUNTER — Emergency Department (HOSPITAL_COMMUNITY)
Admission: EM | Admit: 2020-08-10 | Discharge: 2020-08-10 | Disposition: A | Discharge status: Home / Self Care | Payer: PPO | Attending: Emergency Medicine | Admitting: Emergency Medicine

## 2020-08-10 ENCOUNTER — Telehealth: Payer: Self-pay | Admitting: Medical Oncology

## 2020-08-10 DIAGNOSIS — Z7982 Long term (current) use of aspirin: Secondary | ICD-10-CM | POA: Diagnosis not present

## 2020-08-10 DIAGNOSIS — N183 Chronic kidney disease, stage 3 unspecified: Secondary | ICD-10-CM | POA: Diagnosis not present

## 2020-08-10 DIAGNOSIS — D649 Anemia, unspecified: Secondary | ICD-10-CM | POA: Insufficient documentation

## 2020-08-10 DIAGNOSIS — D631 Anemia in chronic kidney disease: Secondary | ICD-10-CM | POA: Insufficient documentation

## 2020-08-10 DIAGNOSIS — D500 Iron deficiency anemia secondary to blood loss (chronic): Secondary | ICD-10-CM | POA: Diagnosis not present

## 2020-08-10 DIAGNOSIS — Z96653 Presence of artificial knee joint, bilateral: Secondary | ICD-10-CM | POA: Insufficient documentation

## 2020-08-10 DIAGNOSIS — Z79899 Other long term (current) drug therapy: Secondary | ICD-10-CM | POA: Diagnosis not present

## 2020-08-10 DIAGNOSIS — Z85828 Personal history of other malignant neoplasm of skin: Secondary | ICD-10-CM | POA: Insufficient documentation

## 2020-08-10 DIAGNOSIS — Z87891 Personal history of nicotine dependence: Secondary | ICD-10-CM | POA: Diagnosis not present

## 2020-08-10 DIAGNOSIS — I129 Hypertensive chronic kidney disease with stage 1 through stage 4 chronic kidney disease, or unspecified chronic kidney disease: Secondary | ICD-10-CM | POA: Diagnosis not present

## 2020-08-10 DIAGNOSIS — R0602 Shortness of breath: Secondary | ICD-10-CM | POA: Diagnosis not present

## 2020-08-10 DIAGNOSIS — I2510 Atherosclerotic heart disease of native coronary artery without angina pectoris: Secondary | ICD-10-CM | POA: Insufficient documentation

## 2020-08-10 DIAGNOSIS — J449 Chronic obstructive pulmonary disease, unspecified: Secondary | ICD-10-CM | POA: Diagnosis not present

## 2020-08-10 LAB — COMPREHENSIVE METABOLIC PANEL
ALT: 9 U/L (ref 0–44)
AST: 18 U/L (ref 15–41)
Albumin: 4.3 g/dL (ref 3.5–5.0)
Alkaline Phosphatase: 84 U/L (ref 38–126)
Anion gap: 11 (ref 5–15)
BUN: 55 mg/dL — ABNORMAL HIGH (ref 8–23)
CO2: 18 mmol/L — ABNORMAL LOW (ref 22–32)
Calcium: 9.6 mg/dL (ref 8.9–10.3)
Chloride: 110 mmol/L (ref 98–111)
Creatinine, Ser: 1.87 mg/dL — ABNORMAL HIGH (ref 0.61–1.24)
GFR, Estimated: 33 mL/min — ABNORMAL LOW (ref >60)
Glucose, Bld: 190 mg/dL — ABNORMAL HIGH (ref 70–99)
Potassium: 4.8 mmol/L (ref 3.5–5.1)
Sodium: 139 mmol/L (ref 135–145)
Total Bilirubin: 0.2 mg/dL — ABNORMAL LOW (ref 0.3–1.2)
Total Protein: 6.9 g/dL (ref 6.5–8.1)

## 2020-08-10 LAB — CBC
HCT: 22.5 % — ABNORMAL LOW (ref 39.0–52.0)
Hemoglobin: 7 g/dL — ABNORMAL LOW (ref 13.0–17.0)
MCH: 32 pg (ref 26.0–34.0)
MCHC: 31.1 g/dL (ref 30.0–36.0)
MCV: 102.7 fL — ABNORMAL HIGH (ref 80.0–100.0)
Platelets: 164 10*3/uL (ref 150–400)
RBC: 2.19 MIL/uL — ABNORMAL LOW (ref 4.22–5.81)
RDW: 14.3 % (ref 11.5–15.5)
WBC: 6.2 10*3/uL (ref 4.0–10.5)
nRBC: 0 % (ref 0.0–0.2)

## 2020-08-10 LAB — PREPARE RBC (CROSSMATCH)

## 2020-08-10 MED ORDER — SODIUM CHLORIDE 0.9 % IV SOLN
10.0000 mL/h | Freq: Once | INTRAVENOUS | Status: AC
  Administered 2020-08-10: 10 mL/h via INTRAVENOUS

## 2020-08-10 NOTE — ED Provider Notes (Signed)
Emergency Medicine Provider Triage Evaluation Note  [name] , a 87 y.o. male  was evaluated in triage.  Pt complains of low hemoglobin.  Seen in the office his oncologist and his hemoglobin was 7.5, he was advised to go to the ED for 1 unit of blood.  States she has been feeling weak and fatigued over the last few days.  He has not noticed any black or tarry stool or bright red stool, but history of AVMs previously.  Review of Systems  Positive: Weakness, fatigue, low hemoglobin Negative: Melena, abdominal pain, nausea  Physical Exam  BP (!) 141/52 (BP Location: Right Arm)   Pulse 77   Temp 98 F (36.7 C) (Oral)   Resp 16   SpO2 99%  Gen:   Awake, no distress   Resp:  Normal effort  MSK:   Moves extremities without difficulty  Other:    Medical Decision Making  Medically screening exam initiated at 1:12 PM.  Appropriate orders placed.  [name] was informed that the remainder of the evaluation will be completed by another provider, this initial triage assessment does not replace that evaluation, and the importance of remaining in the ED until their evaluation is complete.     Sherrill Raring, PA-C 08/10/20 1316    Wyvonnia Dusky, MD 08/10/20 [id number]

## 2020-08-10 NOTE — ED Provider Notes (Addendum)
[place] DEPT Provider Note   CSN: [id number] Arrival date & time: 08/10/20  1246     History Chief Complaint  Patient presents with   abnormal lab    [name] is a 87 y.o. male.  Patient is an 87 year old male presenting for anemia.  Patient states he was sent in by his PCP for abnormal hemoglobin of 7.  Patient states he has had 5 blood transfusions in the past for anemia previously secondary to AV bleeds with multiple cauterizations by GI.  Admits generalized weakness and fatigue that have been progressing over the past month prompted labs by PCP.  Denies any chest pain or shortness of breath.  Denies any active bleed including no hematochezia, hematemesis, melena, or hematuria.  The history is provided by the patient. No language interpreter was used.  Weakness Severity:  Mild Onset quality:  Gradual Timing:  Constant Progression:  Worsening Associated symptoms: no abdominal pain, no arthralgias, no chest pain, no cough, no dysuria, no fever, no seizures, no shortness of breath and no vomiting       Past Medical History:  Diagnosis Date   Anemia    low hemoglobin   Arthritis    AV (angiodysplasia malformation of colon)    Benign prostatic hypertrophy    Cancer (HCC)    basal cell cancer   Chronic kidney disease    elevated Cr, BUN, Dr. Hassell Done manages   COPD (chronic obstructive pulmonary disease) ([place])    Coronary artery disease    GERD (gastroesophageal reflux disease)    Gout    Heart murmur    History of kidney stones    Hyperlipidemia    Hypertension    Influenza    February 2019   Mitral regurgitation    Mitral valve prolapse    Pneumonia 1970's   PONV (postoperative nausea and vomiting)    1st knee replacement   Shortness of breath     Patient Active Problem List   Diagnosis Date Noted   Renal insufficiency    Lung nodule    GI bleed 06/14/2017   Displaced fracture of distal end of left radius 03/19/2017    Hyponatremia 02/19/2017   GIB (gastrointestinal bleeding) 06/30/2013   Postoperative anemia due to acute blood loss 02/18/2012   Osteoarthritis of left knee 02/15/2012   Hyperlipidemia    Normocytic anemia 05/01/2011   Dyspnea 04/25/2011   Mitral regurgitation, severe 04/25/2011   CAD, single vessel RCA 04/25/2011   HTN (hypertension) 04/25/2011   Dyslipidemia 04/25/2011   Chronic renal insufficiency, stage [name] (moderate) ([place]) 04/25/2011   Gout 04/25/2011    Past Surgical History:  Procedure Laterality Date   ABDOMINAL ANGIOGRAM  04/30/2011   Procedure: ABDOMINAL ANGIOGRAM;  Surgeon: Lorretta Harp, MD;  Location: [facility] CATH LAB;  Service: Cardiovascular;;   CARDIAC CATHETERIZATION  04/2011   x2   CARDIAC CATHETERIZATION  08/2009   CATARACT EXTRACTION  2011   bilateral   COLONOSCOPY  05/04/2011   Procedure: COLONOSCOPY;  Surgeon: Beryle Beams, MD;  Location: [facility] ENDOSCOPY;  Service: Endoscopy;  Laterality: N/A;   COLONOSCOPY N/A 07/16/2013   Procedure: COLONOSCOPY;  Surgeon: Beryle Beams, MD;  Location: [place];  Service: Endoscopy;  Laterality: N/A;   COLONOSCOPY WITH PROPOFOL N/A 06/20/2017   Procedure: COLONOSCOPY WITH PROPOFOL;  Surgeon: Carol Ada, MD;  Location: [facility] ENDOSCOPY;  Service: Endoscopy;  Laterality: N/A;   CORONARY ANGIOGRAM  04/30/2011   Procedure: CORONARY ANGIOGRAM;  Surgeon:  Lorretta Harp, MD;  Location: [facility] CATH LAB;  Service: Cardiovascular;;   ESOPHAGOGASTRODUODENOSCOPY  05/04/2011   Procedure: ESOPHAGOGASTRODUODENOSCOPY (EGD);  Surgeon: Beryle Beams, MD;  Location: [facility] ENDOSCOPY;  Service: Endoscopy;  Laterality: N/A;   EYE SURGERY     bilateral cataracts removed   HERNIA REPAIR  1999&2000   bilateral   LOBECTOMY  1980   left lower lobectomy for benign tumor   OPEN REDUCTION INTERNAL FIXATION (ORIF) DISTAL RADIAL FRACTURE Left 03/19/2017   Procedure: OPEN REDUCTION INTERNAL FIXATION (ORIF) DISTAL RADIAL FRACTURE;  Surgeon: Dorna Leitz, MD;   Location: [place];  Service: Orthopedics;  Laterality: Left;  AXILLARY BLOCK   POLYPECTOMY  06/20/2017   Procedure: POLYPECTOMY;  Surgeon: Carol Ada, MD;  Location: [facility] ENDOSCOPY;  Service: Endoscopy;;   REPLACEMENT TOTAL KNEE  2005   right   RIGHT HEART CATHETERIZATION  04/30/2011   Procedure: RIGHT HEART CATH;  Surgeon: Lorretta Harp, MD;  Location: [facility] CATH LAB;  Service: Cardiovascular;;   TEE WITHOUT CARDIOVERSION  04/30/2011   Procedure: TRANSESOPHAGEAL ECHOCARDIOGRAM (TEE);  Surgeon: Sanda Klein, MD;  Location: [facility] ENDOSCOPY;  Service: Cardiovascular;  Laterality: N/A;  3D TEE/ patient will be admitted the day before test , therefo patient will be a inpatient the day of TEE   TOTAL KNEE ARTHROPLASTY Left 02/15/2012   Procedure: TOTAL KNEE ARTHROPLASTY;  Surgeon: Alta Corning, MD;  Location: [place];  Service: Orthopedics;  Laterality: Left;       Family History  Problem Relation Age of Onset   Hypertension Mother    Osteoporosis Mother    Lung cancer Father    Irregular heart beat Brother    Heart attack Maternal Grandfather    Cancer Paternal Grandfather     Social History   Tobacco Use   Smoking status: Former    Years: 20.00    Types: Cigarettes    Quit date: 02/08/1961    Years since quitting: 59.5   Smokeless tobacco: Former    Quit date: 02/08/1961  Vaping Use   Vaping Use: Never used  Substance Use Topics   Alcohol use: Yes    Alcohol/week: 7.0 standard drinks    Types: 7 Standard drinks or equivalent per week    Comment: 2ozs Building services engineer daily   Drug use: No    Home Medications Prior to Admission medications   Medication Sig Start Date End Date Taking? Authorizing Provider  acetaminophen (TYLENOL) 325 MG tablet Take 650 mg by mouth every 6 (six) hours as needed for mild pain or headache.   Yes [provider]  allopurinol (ZYLOPRIM) 300 MG tablet Take 150 mg by mouth daily.    Yes [provider]  aspirin EC 81 MG tablet Take 1 tablet (81 mg  total) by mouth daily. Hold aspirin for now, please discuss with Dr Benson Norway to decide on when to resume. Patient taking differently: Take 81 mg by mouth See admin instructions. Twice a week on Wednesday's and Sunday's 06/15/17  Yes Florencia Reasons, MD  atorvastatin (LIPITOR) 10 MG tablet TAKE 1 TABLET (10 MG TOTAL) BY MOUTH DAILY AT 6 PM. Patient taking differently: Take 10 mg by mouth every evening. 04/18/20  Yes Lorretta Harp, MD  diltiazem (CARDIZEM CD) 300 MG 24 hr capsule Take 300 mg by mouth daily.   Yes [provider]  lisinopril (ZESTRIL) 20 MG tablet Take 20 mg by mouth daily. 12/01/19  Yes [provider]  omeprazole (PRILOSEC OTC) 20 MG tablet Take  20 mg by mouth every other day.   Yes [provider]  polyethylene glycol (MIRALAX / GLYCOLAX) packet Take 17 g by mouth daily as needed for moderate constipation.    Yes [provider]    Allergies    Patient has no known allergies.  Review of Systems   Review of Systems  Constitutional:  Positive for fatigue. Negative for chills and fever.  HENT:  Negative for ear pain and sore throat.   Eyes:  Negative for pain and visual disturbance.  Respiratory:  Negative for cough and shortness of breath.   Cardiovascular:  Negative for chest pain and palpitations.  Gastrointestinal:  Negative for abdominal pain and vomiting.  Genitourinary:  Negative for dysuria and hematuria.  Musculoskeletal:  Negative for arthralgias and back pain.  Skin:  Negative for color change and rash.  Neurological:  Positive for weakness. Negative for seizures and syncope.  All other systems reviewed and are negative.  Physical Exam Updated Vital Signs BP (!) 158/57   Pulse 65   Temp 97.6 F (36.4 C) (Oral)   Resp (!) 21   Ht 6' (1.829 m)   Wt 77 kg   SpO2 100%   BMI 23.02 kg/m   Physical Exam Vitals and nursing note reviewed.  Constitutional:      Appearance: He is well-developed.  HENT:     Head: Normocephalic and  atraumatic.  Eyes:     Conjunctiva/sclera: Conjunctivae normal.  Cardiovascular:     Rate and Rhythm: Normal rate and regular rhythm.     Heart sounds: No murmur heard. Pulmonary:     Effort: Pulmonary effort is normal. No respiratory distress.     Breath sounds: Normal breath sounds.  Abdominal:     Palpations: Abdomen is soft.     Tenderness: There is no abdominal tenderness.  Musculoskeletal:     Cervical back: Neck supple.  Skin:    General: Skin is warm and dry.     Coloration: Skin is pale.  Neurological:     Mental Status: He is alert.    ED Results / Procedures / Treatments   Labs (all labs ordered are listed, but only abnormal results are displayed) Labs Reviewed  COMPREHENSIVE METABOLIC PANEL - Abnormal; Notable for the following components:      Result Value   CO2 18 (*)    Glucose, Bld 190 (*)    BUN 55 (*)    Creatinine, Ser 1.87 (*)    Total Bilirubin 0.2 (*)    GFR, Estimated 33 (*)    All other components within normal limits  CBC - Abnormal; Notable for the following components:   RBC 2.19 (*)    Hemoglobin 7.0 (*)    HCT 22.5 (*)    MCV 102.7 (*)    All other components within normal limits  POC OCCULT BLOOD, ED  TYPE AND SCREEN  PREPARE RBC (CROSSMATCH)    EKG Please see below for interpretation.  Radiology No results found.  Procedures .Critical Care Performed by: Lianne Cure, DO Authorized by: Lianne Cure, DO   Critical care provider statement:    Critical care time (minutes):  32   Critical care was necessary to treat or prevent imminent or life-threatening deterioration of the following conditions: anemia requiring blood transfusion.   Critical care was time spent personally by me on the following activities:  Discussions with consultants, evaluation of patient's response to treatment, examination of patient, ordering and performing treatments and interventions,  ordering and review of laboratory studies, ordering and review of  radiographic studies, pulse oximetry, re-evaluation of patient's condition, obtaining history from patient or surrogate and review of old charts   Medications Ordered in ED Medications  0.9 %  sodium chloride infusion (has no administration in time range)    ED Course  I have reviewed the triage vital signs and the nursing notes.  Pertinent labs & imaging results that were available during my care of the patient were reviewed by me and considered in my medical decision making (see chart for details).    MDM Rules/Calculators/A&P                         87 year old male presenting for anemia.  Patient is alert and oriented x3, no acute distress, afebrile, stable vital signs.  Physical exam demonstrates pallor.  No evidence of bleeding.  No abdominal tenderness.  Laboratory studies significant for hemoglobin of 7.  Recommended for transfusion for symptomatic anemia at this time.  1 unit of packed red blood cells ordered.  Risk versus benefits discussed in details with patient.  Patient offered admission for further evaluation of anemia however patient states this has been an ongoing problem for the past several years and requests discharge and agrees to follow-up outpatient with his GI specialist.  Patient also states he already has a appointment with the oncologist hematologist for persistent anemia.  Final Clinical Impression(s) / ED Diagnoses Final diagnoses:  Anemia, unspecified type    Rx / DC Orders ED Discharge Orders     None        Lianne Cure, DO [id number]    Lianne Cure, DO [id number]    Lianne Cure, DO [id number]

## 2020-08-10 NOTE — Discharge Instructions (Addendum)
Please follow-up in clinic with your GI doctor.

## 2020-08-10 NOTE — ED Triage Notes (Signed)
Pt reports PCP sent to ER for hemoglobin for 7.5 and for 1 unit of blood. Pt went to PCP for monthly blood draws due to hx of anemia. Pt reports weakness.

## 2020-08-10 NOTE — ED Provider Notes (Signed)
  Physical Exam  BP (!) 158/57   Pulse 65   Temp 97.6 F (36.4 C) (Oral)   Resp (!) 21   Ht 6' (1.829 m)   Wt 77 kg   SpO2 100%   BMI 23.02 kg/m   Physical Exam Vitals and nursing note reviewed.  Constitutional:      Appearance: He is well-developed.  HENT:     Head: Normocephalic and atraumatic.  Eyes:     Conjunctiva/sclera: Conjunctivae normal.  Cardiovascular:     Rate and Rhythm: Normal rate and regular rhythm.     Heart sounds: No murmur heard. Pulmonary:     Effort: Pulmonary effort is normal. No respiratory distress.     Breath sounds: Normal breath sounds.  Abdominal:     Palpations: Abdomen is soft.     Tenderness: There is no abdominal tenderness.  Musculoskeletal:     Cervical back: Neck supple.  Skin:    General: Skin is warm and dry.  Neurological:     Mental Status: He is alert.    ED Course/Procedures     Procedures  MDM    87 year old male with history of gastric AVMs on colonoscopies, presenting with symptomatic anemia.  Hemoglobin 7 with PCP, repeat hemoglobin 7, transfusing 1 unit PRBCs.  Offered admission following transfusion but patient declined.  Has capacity make that decision.  Plan for follow-up outpatient with his GI specialist given the chronicity of his anemia.  No melena or hematochezia.  No hematemesis.  Low concern for active bleed at this time.  The patient was administered 1 unit PRBCs and was observed in the emergency department without evidence of transfusion reaction.  He remained hemodynamically stable in the emergency department at his baseline mental status.  He was subsequently deemed stable for discharge home with a plan for gastroenterology follow-up.    Regan Lemming, MD 08/11/20 [phone]

## 2020-08-10 NOTE — Telephone Encounter (Signed)
  New referral to [facility] scheduled for 08/17.  FYI- Pt is getting a blood transfusion today for hgb of 7.5 g/dl ( Dr Ashby Dawes).  I ordered CBC/diff , cmp and sample to blood bank for his appt with [facility]. 08/17.

## 2020-08-11 LAB — TYPE AND SCREEN
ABO/RH(D): O NEG
Antibody Screen: NEGATIVE
Unit division: 0

## 2020-08-11 LAB — BPAM RBC
Blood Product Expiration Date: 202209032359
ISSUE DATE / TIME: 202208101936
Unit Type and Rh: 9500

## 2020-08-15 ENCOUNTER — Encounter (HOSPITAL_COMMUNITY): Payer: PPO

## 2020-08-17 ENCOUNTER — Inpatient Hospital Stay: Payer: PPO | Attending: Hematology | Admitting: Internal Medicine

## 2020-08-17 ENCOUNTER — Inpatient Hospital Stay: Payer: PPO

## 2020-08-17 ENCOUNTER — Other Ambulatory Visit: Payer: Self-pay

## 2020-08-17 ENCOUNTER — Encounter: Payer: Self-pay | Admitting: Internal Medicine

## 2020-08-17 VITALS — BP 159/55 | HR 80 | Temp 97.0°F | Resp 18 | Ht 72.0 in | Wt 169.8 lb

## 2020-08-17 DIAGNOSIS — D649 Anemia, unspecified: Secondary | ICD-10-CM | POA: Diagnosis not present

## 2020-08-17 DIAGNOSIS — R5382 Chronic fatigue, unspecified: Secondary | ICD-10-CM

## 2020-08-17 DIAGNOSIS — D539 Nutritional anemia, unspecified: Secondary | ICD-10-CM

## 2020-08-17 DIAGNOSIS — Z801 Family history of malignant neoplasm of trachea, bronchus and lung: Secondary | ICD-10-CM

## 2020-08-17 DIAGNOSIS — Z87891 Personal history of nicotine dependence: Secondary | ICD-10-CM

## 2020-08-17 DIAGNOSIS — E611 Iron deficiency: Secondary | ICD-10-CM | POA: Diagnosis not present

## 2020-08-17 DIAGNOSIS — N189 Chronic kidney disease, unspecified: Secondary | ICD-10-CM

## 2020-08-17 DIAGNOSIS — Q2739 Arteriovenous malformation, other site: Secondary | ICD-10-CM

## 2020-08-17 LAB — CBC WITH DIFFERENTIAL (CANCER CENTER ONLY)
Abs Immature Granulocytes: 0.02 10*3/uL (ref 0.00–0.07)
Basophils Absolute: 0 10*3/uL (ref 0.0–0.1)
Basophils Relative: 0 %
Eosinophils Absolute: 0.4 10*3/uL (ref 0.0–0.5)
Eosinophils Relative: 6 %
HCT: 28.5 % — ABNORMAL LOW (ref 39.0–52.0)
Hemoglobin: 9.2 g/dL — ABNORMAL LOW (ref 13.0–17.0)
Immature Granulocytes: 0 %
Lymphocytes Relative: 20 %
Lymphs Abs: 1.3 10*3/uL (ref 0.7–4.0)
MCH: 31.2 pg (ref 26.0–34.0)
MCHC: 32.3 g/dL (ref 30.0–36.0)
MCV: 96.6 fL (ref 80.0–100.0)
Monocytes Absolute: 0.6 10*3/uL (ref 0.1–1.0)
Monocytes Relative: 10 %
Neutro Abs: 4 10*3/uL (ref 1.7–7.7)
Neutrophils Relative %: 64 %
Platelet Count: 168 10*3/uL (ref 150–400)
RBC: 2.95 MIL/uL — ABNORMAL LOW (ref 4.22–5.81)
RDW: 14.3 % (ref 11.5–15.5)
WBC Count: 6.3 10*3/uL (ref 4.0–10.5)
nRBC: 0 % (ref 0.0–0.2)

## 2020-08-17 LAB — CMP (CANCER CENTER ONLY)
ALT: 8 U/L (ref 0–44)
AST: 16 U/L (ref 15–41)
Albumin: 4.2 g/dL (ref 3.5–5.0)
Alkaline Phosphatase: 116 U/L (ref 38–126)
Anion gap: 11 (ref 5–15)
BUN: 45 mg/dL — ABNORMAL HIGH (ref 8–23)
CO2: 20 mmol/L — ABNORMAL LOW (ref 22–32)
Calcium: 9.6 mg/dL (ref 8.9–10.3)
Chloride: 110 mmol/L (ref 98–111)
Creatinine: 1.86 mg/dL — ABNORMAL HIGH (ref 0.61–1.24)
GFR, Estimated: 33 mL/min — ABNORMAL LOW (ref >60)
Glucose, Bld: 112 mg/dL — ABNORMAL HIGH (ref 70–99)
Potassium: 4.9 mmol/L (ref 3.5–5.1)
Sodium: 141 mmol/L (ref 135–145)
Total Bilirubin: 0.4 mg/dL (ref 0.3–1.2)
Total Protein: 7.2 g/dL (ref 6.5–8.1)

## 2020-08-17 LAB — SAMPLE TO BLOOD BANK

## 2020-08-17 LAB — VITAMIN B12: Vitamin B-12: 206 pg/mL (ref 180–914)

## 2020-08-17 LAB — IRON AND TIBC
Iron: 27 ug/dL — ABNORMAL LOW (ref 42–163)
Saturation Ratios: 8 % — ABNORMAL LOW (ref 20–55)
TIBC: 349 ug/dL (ref 202–409)
UIBC: 322 ug/dL (ref 117–376)

## 2020-08-17 LAB — FOLATE: Folate: 16 ng/mL (ref >5.9)

## 2020-08-17 LAB — FERRITIN: Ferritin: 38 ng/mL (ref 24–336)

## 2020-08-17 LAB — RETICULOCYTES
Immature Retic Fract: 16.8 % — ABNORMAL HIGH (ref 2.3–15.9)
RBC.: 2.89 MIL/uL — ABNORMAL LOW (ref 4.22–5.81)
Retic Count, Absolute: 36.1 10*3/uL (ref 19.0–186.0)
Retic Ct Pct: 1.3 % (ref 0.4–3.1)

## 2020-08-17 LAB — TSH: TSH: 1.944 u[IU]/mL (ref 0.320–4.118)

## 2020-08-17 NOTE — Progress Notes (Signed)
[place] Telephone:([phone]) [phone]   [phone]  CONSULT NOTE  REFERRING PHYSICIAN: Dr. [name]  REASON FOR CONSULTATION:  87 years old white male with persistent anemia.  HPI [name] is a 87 y.o. male with past medical history significant for multiple medical problems including history of hypertension, mitral valve prolapse, dyslipidemia, coronary artery disease, GERD, gout, COPD, chronic kidney disease, benign prostatic hypertrophy as well as long history of anemia.  The patient mentioned that he has anemia for at least 10 years secondary to AV malformation based on finding on colonoscopy by Dr. Benson Norway.  His last colonoscopy was on June 20, 2017 and it showed some colon polyps.  The patient has been followed by Dr. Benson Norway as well as Dr. Ashby Dawes at regular basis for his anemia.  He required PRBCs transfusion few times in the last few years.  He also required iron infusion several times.  His last blood work at Dr. Mathis Fare office showed hemoglobin was down to 8.3.  The patient was sent to the emergency department for evaluation and consideration of PRBCs transfusion.  In the emergency department repeat CBC showed hemoglobin was down to 7.0 and he received 1 unit of PRBCs transfusion.  The patient denied having any recent black tarry stool and he was not tested for Hemoccult.  He was referred to me today for evaluation and recommendation regarding his condition.  His recent iron study was not significantly low at his primary care physician's office. When seen today he is feeling fine with no concerning complaints except with exertion.  He denied having any dizzy spells he has chronic kidney disease and he was seen many years ago by Dr. Royce Macadamia and received erythrocyte stimulating factors as well as iron infusion.  He has not received any growth factor by Dr. Joelyn Oms his new nephrologist since the retirement of Dr. Hassell Done.  The patient has no current  chest pain but has shortness of breath with exertion with no cough or hemoptysis.  He denied having any dizzy spells.  He has no nausea, vomiting but has occasional constipation.  He was treated in the past with Integra plus but discontinued several years ago when he had constipation when he was taking the iron supplements. Family history significant for mother died at age 59 with osteoporosis complication.  Father died at age 25 with lung cancer and he also had a sister died from lung cancer. The patient is married and has 2 children a son who is 92 and daughter who is 63.  He was accompanied today by his wife Tamela Oddi.  The patient used to work for [employer].  He has a short history of smoking but quit 1963.  He drinks 1 alcoholic drink every night and no history of drug abuse  HPI  Past Medical History:  Diagnosis Date   Anemia    low hemoglobin   Arthritis    AV (angiodysplasia malformation of colon)    Benign prostatic hypertrophy    Cancer (HCC)    basal cell cancer   Chronic kidney disease    elevated Cr, BUN, Dr. Hassell Done manages   COPD (chronic obstructive pulmonary disease) [facility])    Coronary artery disease    GERD (gastroesophageal reflux disease)    Gout    Heart murmur    History of kidney stones    Hyperlipidemia    Hypertension    Influenza    February 2019   Mitral regurgitation  Mitral valve prolapse    Pneumonia 1970's   PONV (postoperative nausea and vomiting)    1st knee replacement   Shortness of breath     Past Surgical History:  Procedure Laterality Date   ABDOMINAL ANGIOGRAM  04/30/2011   Procedure: ABDOMINAL ANGIOGRAM;  Surgeon: Lorretta Harp, MD;  Location: [facility] CATH LAB;  Service: Cardiovascular;;   CARDIAC CATHETERIZATION  04/2011   x2   CARDIAC CATHETERIZATION  08/2009   CATARACT EXTRACTION  2011   bilateral   COLONOSCOPY  05/04/2011   Procedure: COLONOSCOPY;  Surgeon: Beryle Beams, MD;  Location: [facility] ENDOSCOPY;  Service: Endoscopy;   Laterality: N/A;   COLONOSCOPY N/A 07/16/2013   Procedure: COLONOSCOPY;  Surgeon: Beryle Beams, MD;  Location: [place];  Service: Endoscopy;  Laterality: N/A;   COLONOSCOPY WITH PROPOFOL N/A 06/20/2017   Procedure: COLONOSCOPY WITH PROPOFOL;  Surgeon: Carol Ada, MD;  Location: [facility] ENDOSCOPY;  Service: Endoscopy;  Laterality: N/A;   CORONARY ANGIOGRAM  04/30/2011   Procedure: CORONARY ANGIOGRAM;  Surgeon: Lorretta Harp, MD;  Location: [facility] CATH LAB;  Service: Cardiovascular;;   ESOPHAGOGASTRODUODENOSCOPY  05/04/2011   Procedure: ESOPHAGOGASTRODUODENOSCOPY (EGD);  Surgeon: Beryle Beams, MD;  Location: [facility] ENDOSCOPY;  Service: Endoscopy;  Laterality: N/A;   EYE SURGERY     bilateral cataracts removed   HERNIA REPAIR  1999&2000   bilateral   LOBECTOMY  1980   left lower lobectomy for benign tumor   OPEN REDUCTION INTERNAL FIXATION (ORIF) DISTAL RADIAL FRACTURE Left 03/19/2017   Procedure: OPEN REDUCTION INTERNAL FIXATION (ORIF) DISTAL RADIAL FRACTURE;  Surgeon: Dorna Leitz, MD;  Location: [place];  Service: Orthopedics;  Laterality: Left;  AXILLARY BLOCK   POLYPECTOMY  06/20/2017   Procedure: POLYPECTOMY;  Surgeon: Carol Ada, MD;  Location: [facility] ENDOSCOPY;  Service: Endoscopy;;   REPLACEMENT TOTAL KNEE  2005   right   RIGHT HEART CATHETERIZATION  04/30/2011   Procedure: RIGHT HEART CATH;  Surgeon: Lorretta Harp, MD;  Location: [facility] CATH LAB;  Service: Cardiovascular;;   TEE WITHOUT CARDIOVERSION  04/30/2011   Procedure: TRANSESOPHAGEAL ECHOCARDIOGRAM (TEE);  Surgeon: Sanda Klein, MD;  Location: [facility] ENDOSCOPY;  Service: Cardiovascular;  Laterality: N/A;  3D TEE/ patient will be admitted the day before test , therefo patient will be a inpatient the day of TEE   TOTAL KNEE ARTHROPLASTY Left 02/15/2012   Procedure: TOTAL KNEE ARTHROPLASTY;  Surgeon: Alta Corning, MD;  Location: [place];  Service: Orthopedics;  Laterality: Left;    Family History  Problem Relation Age of Onset    Hypertension Mother    Osteoporosis Mother    Lung cancer Father    Irregular heart beat Brother    Heart attack Maternal Grandfather    Cancer Paternal Grandfather     Social History Social History   Tobacco Use   Smoking status: Former    Years: 20.00    Types: Cigarettes    Quit date: 02/08/1961    Years since quitting: 59.5   Smokeless tobacco: Former    Quit date: 02/08/1961  Vaping Use   Vaping Use: Never used  Substance Use Topics   Alcohol use: Yes    Alcohol/week: 7.0 standard drinks    Types: 7 Standard drinks or equivalent per week    Comment: 2ozs Building services engineer daily   Drug use: No    No Known Allergies  Current Outpatient Medications  Medication Sig Dispense Refill   acetaminophen (TYLENOL) 325 MG tablet Take 650 mg by mouth  every 6 (six) hours as needed for mild pain or headache.     allopurinol (ZYLOPRIM) 300 MG tablet Take 150 mg by mouth daily.      aspirin EC 81 MG tablet Take 1 tablet (81 mg total) by mouth daily. Hold aspirin for now, please discuss with Dr Benson Norway to decide on when to resume. (Patient taking differently: Take 81 mg by mouth See admin instructions. Twice a week on Wednesday's and Sunday's) 30 tablet 0   atorvastatin (LIPITOR) 10 MG tablet TAKE 1 TABLET (10 MG TOTAL) BY MOUTH DAILY AT 6 PM. (Patient taking differently: Take 10 mg by mouth every evening.) 90 tablet 3   diltiazem (CARDIZEM CD) 300 MG 24 hr capsule Take 300 mg by mouth daily.     lisinopril (ZESTRIL) 20 MG tablet Take 20 mg by mouth daily.     omeprazole (PRILOSEC OTC) 20 MG tablet Take 20 mg by mouth every other day.     polyethylene glycol (MIRALAX / GLYCOLAX) packet Take 17 g by mouth daily as needed for moderate constipation.      No current facility-administered medications for this visit.    Review of Systems  Constitutional: positive for fatigue Eyes: negative Ears, nose, mouth, throat, and face: negative Respiratory: positive for dyspnea on exertion Cardiovascular:  negative Gastrointestinal: negative Genitourinary:negative Integument/breast: negative Hematologic/lymphatic: negative Musculoskeletal:positive for arthralgias Neurological: negative Behavioral/Psych: negative Endocrine: negative Allergic/Immunologic: negative  Physical Exam  KGM:WNUUV, healthy, no distress, well nourished, and well developed SKIN: skin color, texture, turgor are normal, no rashes or significant lesions HEAD: Normocephalic, No masses, lesions, tenderness or abnormalities EYES: normal, PERRLA, Conjunctiva are pink and non-injected EARS: External ears normal, Canals clear OROPHARYNX:no exudate, no erythema, and lips, buccal mucosa, and tongue normal  NECK: supple, no adenopathy, no JVD LYMPH:  no palpable lymphadenopathy, no hepatosplenomegaly LUNGS: clear to auscultation , and palpation HEART: regular rate & rhythm, no murmurs, and no gallops ABDOMEN:abdomen soft, non-tender, normal bowel sounds, and no masses or organomegaly BACK: No CVA tenderness, Range of motion is normal EXTREMITIES:no joint deformities, effusion, or inflammation, no edema  NEURO: alert & oriented x 3 with fluent speech, no focal motor/sensory deficits  PERFORMANCE STATUS: ECOG 1  LABORATORY DATA: Lab Results  Component Value Date   WBC 6.3 08/17/2020   HGB 9.2 (L) 08/17/2020   HCT 28.5 (L) 08/17/2020   MCV 96.6 08/17/2020   PLT 168 08/17/2020      Chemistry      Component Value Date/Time   NA 139 08/10/2020 1356   K 4.8 08/10/2020 1356   CL 110 08/10/2020 1356   CO2 18 (L) 08/10/2020 1356   BUN 55 (H) 08/10/2020 1356   CREATININE 1.87 (H) 08/10/2020 1356      Component Value Date/Time   CALCIUM 9.6 08/10/2020 1356   ALKPHOS 84 08/10/2020 1356   AST 18 08/10/2020 1356   ALT 9 08/10/2020 1356   BILITOT 0.2 (L) 08/10/2020 1356       RADIOGRAPHIC STUDIES: No results found.  ASSESSMENT: This is a very pleasant 87 years old white male with history of persistent normocytic  anemia likely secondary to chronic kidney disease in addition to iron deficiency secondary to colon AV malformation.  The patient was treated in the past with iron infusion as well as PRBCs transfusion.   PLAN: I had a lengthy discussion with the patient today about his condition and treatment options.  Repeat CBC today showed persistent anemia but his hemoglobin is up to 9.2  after the PRBCs transfusion in the emergency department. I order several other labs today including rib 8 ferritin level which was low normal at 38.  Serum iron was low at 27 and iron saturation 8%.  The patient has normal vitamin B12 and serum folate levels. I also order other studies including reticulocyte count, TSH was normal serum protein electrophoresis with immunofixation as well as blood for heavy metal and erythropoietin level. I will arrange for the patient to receive iron infusion with Venofer 300 mg IV weekly for 3 weeks.  I will see the patient back for follow-up visit in around 2 months for evaluation and repeat CBC, iron study and ferritin. If there is no improvement in his condition with the iron infusion, I may consider The patient for a bone marrow biopsy and aspirate to rule out any underlying myelodysplastic/myeloproliferative disorder. For the anemia of chronic disease secondary to renal insufficiency, the patient may also be considered for treatment with erythrocyte stimulating factor if no improvement with the iron infusion alone. He was also advised to keep his appointment with Dr. Benson Norway for the suspicious gastrointestinal blood loss from AV malformation. The patient was advised to call immediately if he has any other concerning symptoms in the interval. The patient voices understanding of current disease status and treatment options and is in agreement with the current care plan.  All questions were answered. The patient knows to call the clinic with any problems, questions or concerns. We can certainly see  the patient much sooner if necessary.  Thank you so much for allowing me to participate in the care of Fitzhugh [name]. I will continue to follow up the patient with you and assist in his care.  The total time spent in the appointment was 60 minutes.  Disclaimer: This note was dictated with voice recognition software. Similar sounding words can inadvertently be transcribed and may not be corrected upon review.   Eilleen Kempf August 17, 2020, 12:02 PM

## 2020-08-18 LAB — ERYTHROPOIETIN: Erythropoietin: 24.4 m[IU]/mL — ABNORMAL HIGH (ref 2.6–18.5)

## 2020-08-19 LAB — HEAVY METALS, BLOOD
Arsenic: 3 ug/L (ref 0–9)
Lead: 2 ug/dL (ref 0–4)
Mercury: <1 ug/L (ref 0.0–14.9)

## 2020-08-22 LAB — PROTEIN ELECTROPHORESIS, SERUM, WITH REFLEX
A/G Ratio: 1.6 (ref 0.7–1.7)
Albumin ELP: 4 g/dL (ref 2.9–4.4)
Alpha-1-Globulin: 0.2 g/dL (ref 0.0–0.4)
Alpha-2-Globulin: 0.6 g/dL (ref 0.4–1.0)
Beta Globulin: 0.8 g/dL (ref 0.7–1.3)
Gamma Globulin: 0.8 g/dL (ref 0.4–1.8)
Globulin, Total: 2.5 g/dL (ref 2.2–3.9)
Total Protein ELP: 6.5 g/dL (ref 6.0–8.5)

## 2020-09-02 ENCOUNTER — Inpatient Hospital Stay: Payer: PPO | Attending: Internal Medicine

## 2020-09-02 ENCOUNTER — Other Ambulatory Visit: Payer: Self-pay

## 2020-09-02 VITALS — BP 163/60 | HR 61 | Temp 97.6°F | Resp 18

## 2020-09-02 DIAGNOSIS — D508 Other iron deficiency anemias: Secondary | ICD-10-CM | POA: Insufficient documentation

## 2020-09-02 DIAGNOSIS — D649 Anemia, unspecified: Secondary | ICD-10-CM

## 2020-09-02 DIAGNOSIS — Q2730 Arteriovenous malformation, site unspecified: Secondary | ICD-10-CM | POA: Diagnosis not present

## 2020-09-02 MED ORDER — SODIUM CHLORIDE 0.9 % IV SOLN: Freq: Once | INTRAVENOUS | Status: AC

## 2020-09-02 MED ORDER — IRON SUCROSE 20 MG/ML IV SOLN
300.0000 mg | Freq: Once | INTRAVENOUS | Status: AC
  Administered 2020-09-02: 300 mg via INTRAVENOUS
  Filled 2020-09-02: qty 300

## 2020-09-02 NOTE — Progress Notes (Signed)
Pt declined to stay for 30 min observation following venfoer infusion stating that he has had this medication before at other facilities and has never had any problems. Pt discharged in stable condition, VSS, IV removed intact. Pt educated as to why we recommend the 30 min observation and pt continued to decline to stay, pt also educated on when/how to call for any questions or concerns. Pt declined any questions or concerns at time of discharge.

## 2020-09-02 NOTE — Patient Instructions (Signed)

## 2020-09-07 DIAGNOSIS — N1832 Chronic kidney disease, stage 3b: Secondary | ICD-10-CM | POA: Diagnosis not present

## 2020-09-07 DIAGNOSIS — I10 Essential (primary) hypertension: Secondary | ICD-10-CM | POA: Diagnosis not present

## 2020-09-07 DIAGNOSIS — D500 Iron deficiency anemia secondary to blood loss (chronic): Secondary | ICD-10-CM | POA: Diagnosis not present

## 2020-09-16 ENCOUNTER — Other Ambulatory Visit: Payer: Self-pay

## 2020-09-16 ENCOUNTER — Inpatient Hospital Stay: Payer: PPO

## 2020-09-16 VITALS — BP 134/48 | HR 66 | Temp 97.5°F | Resp 18

## 2020-09-16 DIAGNOSIS — D649 Anemia, unspecified: Secondary | ICD-10-CM

## 2020-09-16 DIAGNOSIS — D508 Other iron deficiency anemias: Secondary | ICD-10-CM | POA: Diagnosis not present

## 2020-09-16 MED ORDER — IRON SUCROSE 20 MG/ML IV SOLN
300.0000 mg | Freq: Once | INTRAVENOUS | Status: AC
  Administered 2020-09-16: 300 mg via INTRAVENOUS
  Filled 2020-09-16: qty 300

## 2020-09-16 MED ORDER — SODIUM CHLORIDE 0.9 % IV SOLN: Freq: Once | INTRAVENOUS | Status: AC

## 2020-09-16 NOTE — Patient Instructions (Signed)

## 2020-09-16 NOTE — Progress Notes (Signed)
Patient declined to stay for 30 minute post Venofer observation, but did remain for 10 minutes post infusion. Tolerated treatment well. VSS at discharge.  Ambulatory to lobby.

## 2020-09-23 ENCOUNTER — Other Ambulatory Visit: Payer: Self-pay

## 2020-09-23 ENCOUNTER — Inpatient Hospital Stay: Payer: PPO

## 2020-09-23 VITALS — BP 157/62 | HR 73 | Temp 97.7°F | Resp 18

## 2020-09-23 DIAGNOSIS — D649 Anemia, unspecified: Secondary | ICD-10-CM

## 2020-09-23 DIAGNOSIS — D508 Other iron deficiency anemias: Secondary | ICD-10-CM | POA: Diagnosis not present

## 2020-09-23 MED ORDER — SODIUM CHLORIDE 0.9 % IV SOLN: Freq: Once | INTRAVENOUS | Status: AC

## 2020-09-23 MED ORDER — SODIUM CHLORIDE 0.9 % IV SOLN
300.0000 mg | Freq: Once | INTRAVENOUS | Status: AC
  Administered 2020-09-23: 300 mg via INTRAVENOUS
  Filled 2020-09-23: qty 300

## 2020-09-23 NOTE — Patient Instructions (Signed)

## 2020-10-17 ENCOUNTER — Inpatient Hospital Stay: Payer: PPO

## 2020-10-17 ENCOUNTER — Other Ambulatory Visit: Payer: Self-pay

## 2020-10-17 ENCOUNTER — Inpatient Hospital Stay: Payer: PPO | Attending: Internal Medicine | Admitting: Internal Medicine

## 2020-10-17 VITALS — BP 167/61 | HR 82 | Temp 97.9°F | Resp 20 | Ht 72.0 in | Wt 173.3 lb

## 2020-10-17 DIAGNOSIS — N189 Chronic kidney disease, unspecified: Secondary | ICD-10-CM | POA: Diagnosis not present

## 2020-10-17 DIAGNOSIS — D508 Other iron deficiency anemias: Secondary | ICD-10-CM | POA: Diagnosis not present

## 2020-10-17 DIAGNOSIS — Z85828 Personal history of other malignant neoplasm of skin: Secondary | ICD-10-CM | POA: Insufficient documentation

## 2020-10-17 DIAGNOSIS — D649 Anemia, unspecified: Secondary | ICD-10-CM | POA: Diagnosis not present

## 2020-10-17 DIAGNOSIS — Q2730 Arteriovenous malformation, site unspecified: Secondary | ICD-10-CM | POA: Diagnosis not present

## 2020-10-17 DIAGNOSIS — D539 Nutritional anemia, unspecified: Secondary | ICD-10-CM

## 2020-10-17 LAB — CBC WITH DIFFERENTIAL (CANCER CENTER ONLY)
Abs Immature Granulocytes: 0.02 10*3/uL (ref 0.00–0.07)
Basophils Absolute: 0 10*3/uL (ref 0.0–0.1)
Basophils Relative: 1 %
Eosinophils Absolute: 0.5 10*3/uL (ref 0.0–0.5)
Eosinophils Relative: 8 %
HCT: 32 % — ABNORMAL LOW (ref 39.0–52.0)
Hemoglobin: 10.7 g/dL — ABNORMAL LOW (ref 13.0–17.0)
Immature Granulocytes: 0 %
Lymphocytes Relative: 25 %
Lymphs Abs: 1.6 10*3/uL (ref 0.7–4.0)
MCH: 31.5 pg (ref 26.0–34.0)
MCHC: 33.4 g/dL (ref 30.0–36.0)
MCV: 94.1 fL (ref 80.0–100.0)
Monocytes Absolute: 0.5 10*3/uL (ref 0.1–1.0)
Monocytes Relative: 8 %
Neutro Abs: 3.9 10*3/uL (ref 1.7–7.7)
Neutrophils Relative %: 58 %
Platelet Count: 140 10*3/uL — ABNORMAL LOW (ref 150–400)
RBC: 3.4 MIL/uL — ABNORMAL LOW (ref 4.22–5.81)
RDW: 17 % — ABNORMAL HIGH (ref 11.5–15.5)
WBC Count: 6.7 10*3/uL (ref 4.0–10.5)
nRBC: 0 % (ref 0.0–0.2)

## 2020-10-17 LAB — IRON AND TIBC
Iron: 94 ug/dL (ref 42–163)
Saturation Ratios: 39 % (ref 20–55)
TIBC: 238 ug/dL (ref 202–409)
UIBC: 144 ug/dL (ref 117–376)

## 2020-10-17 LAB — FERRITIN: Ferritin: 249 ng/mL (ref 24–336)

## 2020-10-17 NOTE — Progress Notes (Signed)
[place] Telephone:([phone]   [phone]) [phone]  OFFICE PROGRESS NOTE  [name], [address] [place] [address]  DIAGNOSIS: Persistent normocytic anemia likely secondary to chronic kidney disease in addition to iron deficiency secondary to colon AV malformation.  PRIOR THERAPY: Iron infusion with Venofer 300 mg IV weekly for 3 weeks.  CURRENT THERAPY: None  INTERVAL HISTORY: [name] 87 y.o. male returns to the clinic today for follow-up visit accompanied by his wife.  The patient is feeling a little bit better after the iron infusion with Venofer.  He has more energy than before.  He continues to have fatigue and shortness of breath with exertion.  He has no chest pain, cough or hemoptysis.  He denied having any weight loss or night sweats.  He has no nausea, vomiting, diarrhea or constipation.  He has no headache or visual changes.  He is here today for evaluation and repeat CBC, iron study and ferritin.  MEDICAL HISTORY: Past Medical History:  Diagnosis Date   Anemia    low hemoglobin   Arthritis    AV (angiodysplasia malformation of colon)    Benign prostatic hypertrophy    Cancer (HCC)    basal cell cancer   Chronic kidney disease    elevated Cr, BUN, Dr. Hassell Done manages   COPD (chronic obstructive pulmonary disease) ([place])    Coronary artery disease    GERD (gastroesophageal reflux disease)    Gout    Heart murmur    History of kidney stones    Hyperlipidemia    Hypertension    Influenza    February 2019   Mitral regurgitation    Mitral valve prolapse    Pneumonia 1970's   PONV (postoperative nausea and vomiting)    1st knee replacement   Shortness of breath     ALLERGIES:  has No Known Allergies.  MEDICATIONS:  Current Outpatient Medications  Medication Sig Dispense Refill   acetaminophen (TYLENOL) 325 MG tablet Take 650 mg by mouth every 6 (six) hours as needed for mild pain or  headache.     allopurinol (ZYLOPRIM) 300 MG tablet Take 150 mg by mouth daily.      aspirin EC 81 MG tablet Take 1 tablet (81 mg total) by mouth daily. Hold aspirin for now, please discuss with Dr Benson Norway to decide on when to resume. (Patient taking differently: Take 81 mg by mouth See admin instructions. Twice a week on Wednesday's and Sunday's) 30 tablet 0   atorvastatin (LIPITOR) 10 MG tablet TAKE 1 TABLET (10 MG TOTAL) BY MOUTH DAILY AT 6 PM. (Patient taking differently: Take 10 mg by mouth every evening.) 90 tablet 3   diltiazem (CARDIZEM CD) 300 MG 24 hr capsule Take 300 mg by mouth daily.     lisinopril (ZESTRIL) 20 MG tablet Take 20 mg by mouth daily.     omeprazole (PRILOSEC OTC) 20 MG tablet Take 20 mg by mouth every other day.     polyethylene glycol (MIRALAX / GLYCOLAX) packet Take 17 g by mouth daily as needed for moderate constipation.      No current facility-administered medications for this visit.    SURGICAL HISTORY:  Past Surgical History:  Procedure Laterality Date   ABDOMINAL ANGIOGRAM  04/30/2011   Procedure: ABDOMINAL ANGIOGRAM;  Surgeon: Lorretta Harp, MD;  Location: [facility] CATH LAB;  Service: Cardiovascular;;   CARDIAC CATHETERIZATION  04/2011   x2   CARDIAC  CATHETERIZATION  08/2009   CATARACT EXTRACTION  2011   bilateral   COLONOSCOPY  05/04/2011   Procedure: COLONOSCOPY;  Surgeon: Beryle Beams, MD;  Location: [facility] ENDOSCOPY;  Service: Endoscopy;  Laterality: N/A;   COLONOSCOPY N/A 07/16/2013   Procedure: COLONOSCOPY;  Surgeon: Beryle Beams, MD;  Location: [place];  Service: Endoscopy;  Laterality: N/A;   COLONOSCOPY WITH PROPOFOL N/A 06/20/2017   Procedure: COLONOSCOPY WITH PROPOFOL;  Surgeon: Carol Ada, MD;  Location: [facility] ENDOSCOPY;  Service: Endoscopy;  Laterality: N/A;   CORONARY ANGIOGRAM  04/30/2011   Procedure: CORONARY ANGIOGRAM;  Surgeon: Lorretta Harp, MD;  Location: [facility] CATH LAB;  Service: Cardiovascular;;   ESOPHAGOGASTRODUODENOSCOPY  05/04/2011    Procedure: ESOPHAGOGASTRODUODENOSCOPY (EGD);  Surgeon: Beryle Beams, MD;  Location: [facility] ENDOSCOPY;  Service: Endoscopy;  Laterality: N/A;   EYE SURGERY     bilateral cataracts removed   HERNIA REPAIR  1999&2000   bilateral   LOBECTOMY  1980   left lower lobectomy for benign tumor   OPEN REDUCTION INTERNAL FIXATION (ORIF) DISTAL RADIAL FRACTURE Left 03/19/2017   Procedure: OPEN REDUCTION INTERNAL FIXATION (ORIF) DISTAL RADIAL FRACTURE;  Surgeon: Dorna Leitz, MD;  Location: [place];  Service: Orthopedics;  Laterality: Left;  AXILLARY BLOCK   POLYPECTOMY  06/20/2017   Procedure: POLYPECTOMY;  Surgeon: Carol Ada, MD;  Location: [facility] ENDOSCOPY;  Service: Endoscopy;;   REPLACEMENT TOTAL KNEE  2005   right   RIGHT HEART CATHETERIZATION  04/30/2011   Procedure: RIGHT HEART CATH;  Surgeon: Lorretta Harp, MD;  Location: [facility] CATH LAB;  Service: Cardiovascular;;   TEE WITHOUT CARDIOVERSION  04/30/2011   Procedure: TRANSESOPHAGEAL ECHOCARDIOGRAM (TEE);  Surgeon: Sanda Klein, MD;  Location: [facility] ENDOSCOPY;  Service: Cardiovascular;  Laterality: N/A;  3D TEE/ patient will be admitted the day before test , therefo patient will be a inpatient the day of TEE   TOTAL KNEE ARTHROPLASTY Left 02/15/2012   Procedure: TOTAL KNEE ARTHROPLASTY;  Surgeon: Alta Corning, MD;  Location: [place];  Service: Orthopedics;  Laterality: Left;    REVIEW OF SYSTEMS:  A comprehensive review of systems was negative except for: Constitutional: positive for fatigue   PHYSICAL EXAMINATION: General appearance: alert, cooperative, fatigued, and no distress Head: Normocephalic, without obvious abnormality, atraumatic Neck: no adenopathy, no JVD, supple, symmetrical, trachea midline, and thyroid not enlarged, symmetric, no tenderness/mass/nodules Lymph nodes: Cervical, supraclavicular, and axillary nodes normal. Resp: clear to auscultation bilaterally Back: symmetric, no curvature. ROM normal. No CVA tenderness. Cardio: regular rate  and rhythm, S1, S2 normal, no murmur, click, rub or gallop GI: soft, non-tender; bowel sounds normal; no masses,  no organomegaly Extremities: extremities normal, atraumatic, no cyanosis or edema  ECOG PERFORMANCE STATUS: 1 - Symptomatic but completely ambulatory  Blood pressure (!) 167/61, pulse 82, temperature 97.9 F (36.6 C), temperature source Tympanic, resp. rate 20, height 6' (1.829 m), weight 173 lb 4.8 oz (78.6 kg), SpO2 94 %.  LABORATORY DATA: Lab Results  Component Value Date   WBC 6.7 10/17/2020   HGB 10.7 (L) 10/17/2020   HCT 32.0 (L) 10/17/2020   MCV 94.1 10/17/2020   PLT 140 (L) 10/17/2020      Chemistry      Component Value Date/Time   NA 141 08/17/2020 1119   K 4.9 08/17/2020 1119   CL 110 08/17/2020 1119   CO2 20 (L) 08/17/2020 1119   BUN 45 (H) 08/17/2020 1119   CREATININE 1.86 (H) 08/17/2020 1119  Component Value Date/Time   CALCIUM 9.6 08/17/2020 1119   ALKPHOS 116 08/17/2020 1119   AST 16 08/17/2020 1119   ALT 8 08/17/2020 1119   BILITOT 0.4 08/17/2020 1119       RADIOGRAPHIC STUDIES: No results found.  ASSESSMENT AND PLAN: history of persistent normocytic anemia likely secondary to chronic kidney disease in addition to iron deficiency secondary to colon AV malformation. The patient was treated with iron infusion with Venofer 300 mg IV weekly for 3 weeks and he tolerated his infusion well.  Repeat CBC today showed improvement of his hemoglobin and hematocrit.  His hemoglobin is 10.7 today.  Iron study and ferritin are still pending I recommended for the patient to continue on observation in addition to increasing his iron rich diet. I will see him back for follow-up visit in 3 months for evaluation and repeat CBC, iron study and ferritin. If the pending iron studies showed significant iron deficiency, I will arrange for the patient to receive iron infusion in the interval. He was advised to call immediately if he has any other concerning  symptoms in the interval. The patient voices understanding of current disease status and treatment options and is in agreement with the current care plan.  All questions were answered. The patient knows to call the clinic with any problems, questions or concerns. We can certainly see the patient much sooner if necessary.  The total time spent in the appointment was 20 minutes.  Disclaimer: This note was dictated with voice recognition software. Similar sounding words can inadvertently be transcribed and may not be corrected upon review.

## 2020-11-09 DIAGNOSIS — E782 Mixed hyperlipidemia: Secondary | ICD-10-CM | POA: Diagnosis not present

## 2020-11-14 DIAGNOSIS — D500 Iron deficiency anemia secondary to blood loss (chronic): Secondary | ICD-10-CM | POA: Diagnosis not present

## 2020-11-15 DIAGNOSIS — E782 Mixed hyperlipidemia: Secondary | ICD-10-CM | POA: Diagnosis not present

## 2020-11-15 DIAGNOSIS — N1832 Chronic kidney disease, stage 3b: Secondary | ICD-10-CM | POA: Diagnosis not present

## 2020-11-15 DIAGNOSIS — I700 Atherosclerosis of aorta: Secondary | ICD-10-CM | POA: Diagnosis not present

## 2020-11-15 DIAGNOSIS — R7303 Prediabetes: Secondary | ICD-10-CM | POA: Diagnosis not present

## 2020-11-15 DIAGNOSIS — I129 Hypertensive chronic kidney disease with stage 1 through stage 4 chronic kidney disease, or unspecified chronic kidney disease: Secondary | ICD-10-CM | POA: Diagnosis not present

## 2020-11-15 DIAGNOSIS — D500 Iron deficiency anemia secondary to blood loss (chronic): Secondary | ICD-10-CM | POA: Diagnosis not present

## 2020-12-14 DIAGNOSIS — N183 Chronic kidney disease, stage 3 unspecified: Secondary | ICD-10-CM | POA: Diagnosis not present

## 2020-12-14 DIAGNOSIS — D500 Iron deficiency anemia secondary to blood loss (chronic): Secondary | ICD-10-CM | POA: Diagnosis not present

## 2021-01-05 DIAGNOSIS — E872 Acidosis, unspecified: Secondary | ICD-10-CM | POA: Diagnosis not present

## 2021-01-05 DIAGNOSIS — I129 Hypertensive chronic kidney disease with stage 1 through stage 4 chronic kidney disease, or unspecified chronic kidney disease: Secondary | ICD-10-CM | POA: Diagnosis not present

## 2021-01-05 DIAGNOSIS — N400 Enlarged prostate without lower urinary tract symptoms: Secondary | ICD-10-CM | POA: Diagnosis not present

## 2021-01-05 DIAGNOSIS — N183 Chronic kidney disease, stage 3 unspecified: Secondary | ICD-10-CM | POA: Diagnosis not present

## 2021-01-05 DIAGNOSIS — R809 Proteinuria, unspecified: Secondary | ICD-10-CM | POA: Diagnosis not present

## 2021-01-18 ENCOUNTER — Other Ambulatory Visit: Payer: Self-pay

## 2021-01-18 DIAGNOSIS — D649 Anemia, unspecified: Secondary | ICD-10-CM

## 2021-01-19 ENCOUNTER — Other Ambulatory Visit: Payer: Self-pay

## 2021-01-19 ENCOUNTER — Inpatient Hospital Stay: Payer: PPO

## 2021-01-19 ENCOUNTER — Inpatient Hospital Stay: Payer: PPO | Attending: Internal Medicine | Admitting: Internal Medicine

## 2021-01-19 VITALS — BP 153/66 | HR 77 | Temp 97.7°F | Resp 20 | Ht 72.0 in | Wt 175.5 lb

## 2021-01-19 DIAGNOSIS — D508 Other iron deficiency anemias: Secondary | ICD-10-CM | POA: Insufficient documentation

## 2021-01-19 DIAGNOSIS — N189 Chronic kidney disease, unspecified: Secondary | ICD-10-CM | POA: Diagnosis not present

## 2021-01-19 DIAGNOSIS — Z79899 Other long term (current) drug therapy: Secondary | ICD-10-CM | POA: Insufficient documentation

## 2021-01-19 DIAGNOSIS — D631 Anemia in chronic kidney disease: Secondary | ICD-10-CM | POA: Insufficient documentation

## 2021-01-19 DIAGNOSIS — D649 Anemia, unspecified: Secondary | ICD-10-CM

## 2021-01-19 DIAGNOSIS — Z7982 Long term (current) use of aspirin: Secondary | ICD-10-CM | POA: Insufficient documentation

## 2021-01-19 LAB — CBC WITH DIFFERENTIAL (CANCER CENTER ONLY)
Abs Immature Granulocytes: 0.03 10*3/uL (ref 0.00–0.07)
Basophils Absolute: 0 10*3/uL (ref 0.0–0.1)
Basophils Relative: 1 %
Eosinophils Absolute: 0.4 10*3/uL (ref 0.0–0.5)
Eosinophils Relative: 6 %
HCT: 33.6 % — ABNORMAL LOW (ref 39.0–52.0)
Hemoglobin: 11 g/dL — ABNORMAL LOW (ref 13.0–17.0)
Immature Granulocytes: 0 %
Lymphocytes Relative: 25 %
Lymphs Abs: 1.7 10*3/uL (ref 0.7–4.0)
MCH: 32.6 pg (ref 26.0–34.0)
MCHC: 32.7 g/dL (ref 30.0–36.0)
MCV: 99.7 fL (ref 80.0–100.0)
Monocytes Absolute: 0.7 10*3/uL (ref 0.1–1.0)
Monocytes Relative: 11 %
Neutro Abs: 4 10*3/uL (ref 1.7–7.7)
Neutrophils Relative %: 57 %
Platelet Count: 141 10*3/uL — ABNORMAL LOW (ref 150–400)
RBC: 3.37 MIL/uL — ABNORMAL LOW (ref 4.22–5.81)
RDW: 14.6 % (ref 11.5–15.5)
WBC Count: 6.9 10*3/uL (ref 4.0–10.5)
nRBC: 0 % (ref 0.0–0.2)

## 2021-01-19 LAB — IRON AND IRON BINDING CAPACITY (CC-WL,HP ONLY)
Iron: 107 ug/dL (ref 45–182)
Saturation Ratios: 36 % (ref 17.9–39.5)
TIBC: 301 ug/dL (ref 250–450)
UIBC: 194 ug/dL (ref 117–376)

## 2021-01-19 LAB — FERRITIN: Ferritin: 145 ng/mL (ref 24–336)

## 2021-01-19 NOTE — Progress Notes (Signed)
[place] Telephone:([phone]   Fax:([phone]  OFFICE PROGRESS NOTE  [name], [place] [address]  DIAGNOSIS: Persistent normocytic anemia likely secondary to chronic kidney disease in addition to iron deficiency secondary to colon AV malformation.  PRIOR THERAPY: Iron infusion with Venofer 300 mg IV weekly for 3 weeks.  CURRENT THERAPY: None  INTERVAL HISTORY: [name] 88 y.o. male returns to the clinic today for follow-up visit.  The patient is feeling fine today with no concerning complaints.  He denied having any fatigue or weakness.  He denied having any chest pain, shortness of breath, cough or hemoptysis.  Nausea, vomiting, diarrhea or constipation.  He has no headache or visual changes.  He denied having any significant weight loss or night sweats.  He is here today for evaluation with repeat CBC, iron study and ferritin.  MEDICAL HISTORY: Past Medical History:  Diagnosis Date   Anemia    low hemoglobin   Arthritis    AV (angiodysplasia malformation of colon)    Benign prostatic hypertrophy    Cancer (HCC)    basal cell cancer   Chronic kidney disease    elevated Cr, BUN, Dr. Hassell Done manages   COPD (chronic obstructive pulmonary disease) ([place])    Coronary artery disease    GERD (gastroesophageal reflux disease)    Gout    Heart murmur    History of kidney stones    Hyperlipidemia    Hypertension    Influenza    February 2019   Mitral regurgitation    Mitral valve prolapse    Pneumonia 1970's   PONV (postoperative nausea and vomiting)    1st knee replacement   Shortness of breath     ALLERGIES:  has No Known Allergies.  MEDICATIONS:  Current Outpatient Medications  Medication Sig Dispense Refill   acetaminophen (TYLENOL) 325 MG tablet Take 650 mg by mouth every 6 (six) hours as needed for mild pain or headache.     allopurinol (ZYLOPRIM) 300 MG tablet Take 150 mg by  mouth daily.      aspirin EC 81 MG tablet Take 1 tablet (81 mg total) by mouth daily. Hold aspirin for now, please discuss with Dr Benson Norway to decide on when to resume. (Patient taking differently: Take 81 mg by mouth See admin instructions. Twice a week on Wednesday's and Sunday's) 30 tablet 0   atorvastatin (LIPITOR) 10 MG tablet TAKE 1 TABLET (10 MG TOTAL) BY MOUTH DAILY AT 6 PM. (Patient taking differently: Take 10 mg by mouth every evening.) 90 tablet 3   diltiazem (CARDIZEM CD) 300 MG 24 hr capsule Take 300 mg by mouth daily.     lisinopril (ZESTRIL) 20 MG tablet Take 20 mg by mouth daily.     omeprazole (PRILOSEC OTC) 20 MG tablet Take 20 mg by mouth every other day.     polyethylene glycol (MIRALAX / GLYCOLAX) packet Take 17 g by mouth daily as needed for moderate constipation.      No current facility-administered medications for this visit.    SURGICAL HISTORY:  Past Surgical History:  Procedure Laterality Date   ABDOMINAL ANGIOGRAM  04/30/2011   Procedure: ABDOMINAL ANGIOGRAM;  Surgeon: Lorretta Harp, MD;  Location: [facility] CATH LAB;  Service: Cardiovascular;;   CARDIAC CATHETERIZATION  04/2011   x2   CARDIAC CATHETERIZATION  08/2009   CATARACT EXTRACTION  2011   bilateral   COLONOSCOPY  05/04/2011  Procedure: COLONOSCOPY;  Surgeon: Beryle Beams, MD;  Location: [facility] ENDOSCOPY;  Service: Endoscopy;  Laterality: N/A;   COLONOSCOPY N/A 07/16/2013   Procedure: COLONOSCOPY;  Surgeon: Beryle Beams, MD;  Location: [place];  Service: Endoscopy;  Laterality: N/A;   COLONOSCOPY WITH PROPOFOL N/A 06/20/2017   Procedure: COLONOSCOPY WITH PROPOFOL;  Surgeon: Carol Ada, MD;  [facility];  Service: Endoscopy;  Laterality: N/A;   CORONARY ANGIOGRAM  04/30/2011   Procedure: CORONARY ANGIOGRAM;  Surgeon: Lorretta Harp, MD;  Location[facility] CATH LAB;  Service: Cardiovascular;;   ESOPHAGOGASTRODUODENOSCOPY  05/04/2011   Procedure: ESOPHAGOGASTRODUODENOSCOPY (EGD);  Surgeon: Beryle Beams, MD;  Location: [facility];  Service: Endoscopy;  Laterality: N/A;   EYE SURGERY     bilateral cataracts removed   HERNIA REPAIR  1999&2000   bilateral   LOBECTOMY  1980   left lower lobectomy for benign tumor   OPEN REDUCTION INTERNAL FIXATION (ORIF) DISTAL RADIAL FRACTURE Left 03/19/2017   Procedure: OPEN REDUCTION INTERNAL FIXATION (ORIF) DISTAL RADIAL FRACTURE;  Surgeon: Dorna Leitz, MD;  Location: [place];  Service: Orthopedics;  Laterality: Left;  AXILLARY BLOCK   POLYPECTOMY  06/20/2017   Procedure: POLYPECTOMY;  Surgeon: Carol Ada, MD;  [facility];  Service: Endoscopy;;   REPLACEMENT TOTAL KNEE  2005   right   RIGHT HEART CATHETERIZATION  04/30/2011   Procedure: RIGHT HEART CATH;  Surgeon: Lorretta Harp, MD;  Location: [facility] CATH LAB;  Service: Cardiovascular;;   TEE WITHOUT CARDIOVERSION  04/30/2011   Procedure: TRANSESOPHAGEAL ECHOCARDIOGRAM (TEE);  Surgeon: Sanda Klein, MD;  Location: [facility] ENDOSCOPY;  Service: Cardiovascular;  Laterality: N/A;  3D TEE/ patient will be admitted the day before test , therefo patient will be a inpatient the day of TEE   TOTAL KNEE ARTHROPLASTY Left 02/15/2012   Procedure: TOTAL KNEE ARTHROPLASTY;  Surgeon: Alta Corning, MD;  Location: [place];  Service: Orthopedics;  Laterality: Left;    REVIEW OF SYSTEMS:  A comprehensive review of systems was negative.   PHYSICAL EXAMINATION: General appearance: alert, cooperative, fatigued, and no distress Head: Normocephalic, without obvious abnormality, atraumatic Neck: no adenopathy, no JVD, supple, symmetrical, trachea midline, and thyroid not enlarged, symmetric, no tenderness/mass/nodules Lymph nodes: Cervical, supraclavicular, and axillary nodes normal. Resp: clear to auscultation bilaterally Back: symmetric, no curvature. ROM normal. No CVA tenderness. Cardio: regular rate and rhythm, S1, S2 normal, no murmur, click, rub or gallop GI: soft, non-tender; bowel sounds normal; no masses,   no organomegaly Extremities: extremities normal, atraumatic, no cyanosis or edema  ECOG PERFORMANCE STATUS: 1 - Symptomatic but completely ambulatory  Blood pressure (!) 153/66, pulse 77, temperature 97.7 F (36.5 C), temperature source Tympanic, resp. rate 20, height 6' (1.829 m), weight 175 lb 8 oz (79.6 kg), SpO2 96 %.  LABORATORY DATA: Lab Results  Component Value Date   WBC 6.9 01/19/2021   HGB 11.0 (L) 01/19/2021   HCT 33.6 (L) 01/19/2021   MCV 99.7 01/19/2021   PLT 141 (L) 01/19/2021      Chemistry      Component Value Date/Time   NA 141 08/17/2020 1119   K 4.9 08/17/2020 1119   CL 110 08/17/2020 1119   CO2 20 (L) 08/17/2020 1119   BUN 45 (H) 08/17/2020 1119   CREATININE 1.86 (H) 08/17/2020 1119      Component Value Date/Time   CALCIUM 9.6 08/17/2020 1119   ALKPHOS 116 08/17/2020 1119   AST 16 08/17/2020 1119   ALT 8  08/17/2020 1119   BILITOT 0.4 08/17/2020 1119       RADIOGRAPHIC STUDIES: No results found.  ASSESSMENT AND PLAN: history of persistent normocytic anemia likely secondary to chronic kidney disease in addition to iron deficiency secondary to colon AV malformation. The patient was treated with iron infusion with Venofer 300 mg IV weekly for 3 weeks and he tolerated his infusion well.  Repeat CBC today showed hemoglobin of 11.0 and hematocrit of 33.6%. Iron study and ferritin are still pending. I recommended for the patient to continue on observation with repeat CBC, iron study and ferritin in 3 months. If the pending iron studies showed significant deficiency, we will call the patient with additional recommendation and iron infusion. He was advised to call immediately if he has any other concerning symptoms in the interval.  The patient voices understanding of current disease status and treatment options and is in agreement with the current care plan.  All questions were answered. The patient knows to call the clinic with any problems, questions or  concerns. We can certainly see the patient much sooner if necessary.  The total time spent in the appointment was 20 minutes.  Disclaimer: This note was dictated with voice recognition software. Similar sounding words can inadvertently be transcribed and may not be corrected upon review.

## 2021-02-07 DIAGNOSIS — L728 Other follicular cysts of the skin and subcutaneous tissue: Secondary | ICD-10-CM | POA: Diagnosis not present

## 2021-02-07 DIAGNOSIS — I83009 Varicose veins of unspecified lower extremity with ulcer of unspecified site: Secondary | ICD-10-CM | POA: Diagnosis not present

## 2021-02-07 DIAGNOSIS — L928 Other granulomatous disorders of the skin and subcutaneous tissue: Secondary | ICD-10-CM | POA: Diagnosis not present

## 2021-02-08 DIAGNOSIS — R7303 Prediabetes: Secondary | ICD-10-CM | POA: Diagnosis not present

## 2021-02-08 DIAGNOSIS — I129 Hypertensive chronic kidney disease with stage 1 through stage 4 chronic kidney disease, or unspecified chronic kidney disease: Secondary | ICD-10-CM | POA: Diagnosis not present

## 2021-02-08 DIAGNOSIS — D500 Iron deficiency anemia secondary to blood loss (chronic): Secondary | ICD-10-CM | POA: Diagnosis not present

## 2021-02-08 DIAGNOSIS — N1832 Chronic kidney disease, stage 3b: Secondary | ICD-10-CM | POA: Diagnosis not present

## 2021-02-08 DIAGNOSIS — R5383 Other fatigue: Secondary | ICD-10-CM | POA: Diagnosis not present

## 2021-02-08 DIAGNOSIS — I700 Atherosclerosis of aorta: Secondary | ICD-10-CM | POA: Diagnosis not present

## 2021-02-08 DIAGNOSIS — E782 Mixed hyperlipidemia: Secondary | ICD-10-CM | POA: Diagnosis not present

## 2021-02-15 DIAGNOSIS — Z0000 Encounter for general adult medical examination without abnormal findings: Secondary | ICD-10-CM | POA: Diagnosis not present

## 2021-02-15 DIAGNOSIS — D500 Iron deficiency anemia secondary to blood loss (chronic): Secondary | ICD-10-CM | POA: Diagnosis not present

## 2021-02-15 DIAGNOSIS — N1832 Chronic kidney disease, stage 3b: Secondary | ICD-10-CM | POA: Diagnosis not present

## 2021-02-15 DIAGNOSIS — I129 Hypertensive chronic kidney disease with stage 1 through stage 4 chronic kidney disease, or unspecified chronic kidney disease: Secondary | ICD-10-CM | POA: Diagnosis not present

## 2021-02-15 DIAGNOSIS — N400 Enlarged prostate without lower urinary tract symptoms: Secondary | ICD-10-CM | POA: Diagnosis not present

## 2021-02-15 DIAGNOSIS — I2510 Atherosclerotic heart disease of native coronary artery without angina pectoris: Secondary | ICD-10-CM | POA: Diagnosis not present

## 2021-02-15 DIAGNOSIS — E782 Mixed hyperlipidemia: Secondary | ICD-10-CM | POA: Diagnosis not present

## 2021-02-15 DIAGNOSIS — M150 Primary generalized (osteo)arthritis: Secondary | ICD-10-CM | POA: Diagnosis not present

## 2021-02-15 DIAGNOSIS — I495 Sick sinus syndrome: Secondary | ICD-10-CM | POA: Diagnosis not present

## 2021-02-15 DIAGNOSIS — I6521 Occlusion and stenosis of right carotid artery: Secondary | ICD-10-CM | POA: Diagnosis not present

## 2021-02-15 DIAGNOSIS — I700 Atherosclerosis of aorta: Secondary | ICD-10-CM | POA: Diagnosis not present

## 2021-02-15 DIAGNOSIS — R7303 Prediabetes: Secondary | ICD-10-CM | POA: Diagnosis not present

## 2021-03-14 ENCOUNTER — Ambulatory Visit: Payer: PPO | Admitting: Cardiovascular Disease

## 2021-03-14 ENCOUNTER — Encounter: Payer: Self-pay | Admitting: Cardiovascular Disease

## 2021-03-14 ENCOUNTER — Other Ambulatory Visit: Payer: Self-pay

## 2021-03-14 VITALS — BP 160/74 | HR 72 | Ht 72.0 in | Wt 173.8 lb

## 2021-03-14 DIAGNOSIS — E785 Hyperlipidemia, unspecified: Secondary | ICD-10-CM | POA: Diagnosis not present

## 2021-03-14 DIAGNOSIS — I2510 Atherosclerotic heart disease of native coronary artery without angina pectoris: Secondary | ICD-10-CM

## 2021-03-14 DIAGNOSIS — I340 Nonrheumatic mitral (valve) insufficiency: Secondary | ICD-10-CM | POA: Diagnosis not present

## 2021-03-14 DIAGNOSIS — I10 Essential (primary) hypertension: Secondary | ICD-10-CM

## 2021-03-14 NOTE — Assessment & Plan Note (Signed)
History of dyslipidemia on statin therapy with lipid profile performed by his PCP 02/09/2021 revealing total cholesterol of 143, LDL 69 and HDL 60. ?

## 2021-03-14 NOTE — Progress Notes (Signed)
? ? ? ?03/14/2021 ?[name]   ?Aug 12, 1925  ?[id number] ? ?Primary Physician [name], MD ?Primary Cardiologist: Lorretta Harp MD Lupe Car[place], [place] ? ?HPI:  [name] is a 88 y.o.    thin-appearing married Caucasian male, father of 2, grandfather to 2 grandchildren, who I last saw office   03/04/2020. He is the retired Dance movement psychotherapist at [employer] and is very detail oriented.  He has a history of noncritical CAD by catheterization performed by myself, August 15, 2009, with moderate disease in his mid RCA, LAD, and ramus branch, and normal LV function. He did have severe MR secondary to mitral valve prolapse with severe left atrial enlargement. His other problems include hypertension and hyperlipidemia. He was complaining of increasing dyspnea. I brought him in, performing right and left heart catheterization, April 30, 2011, revealing a 50% proximal ramus branch stenosis, 70% mid dominant RCA stenosis just after the takeoff of an acute marginal branch. A TEE performed by Dr. Sallyanne Kuster on April 30, 2011, revealed severe flail mid scallop of the posterior leaflet secondary to ruptured chordae. He was fairly anemic at that time and was seen by Dr. Carol Ada. He underwent upper and lower endoscopy, revealing AVMs. His hemoglobin improved with iron repletion and his symptoms resolved.  He gets his hemoglobin checked frequently and is in the 10 range currently after iron transfusion. ?  ?Since I saw him a year ago he is remained stable.    He and his wife work out at home for half an hour a day.  [facility]  He works out on a bicycle because of bad knees.  He continues to get iron transfusions because of iron deficiency anemia presumably from AVMs.  He checks his blood pressure daily and they are all in the normal range.  He denies chest pain or shortness of breath.  He is getting iron infusions by Dr. Earlie Server.  His most recent  hemoglobin is 11.8. ? ? ?No outpatient medications have been marked as taking for the 03/14/21 encounter (Office Visit) with Lorretta Harp, MD.  ?  ? ?No Known Allergies ? ?Social History  ? ?Socioeconomic History  ? Marital status: Married  ?  Spouse name: Not on file  ? Number of children: Not on file  ? Years of education: Not on file  ? Highest education level: Not on file  ?Occupational History  ? Not on file  ?Tobacco Use  ? Smoking status: Former  ?  Years: 20.00  ?  Types: Cigarettes  ?  Quit date: 02/08/1961  ?  Years since quitting: 60.1  ? Smokeless tobacco: Former  ?  Quit date: 02/08/1961  ?Vaping Use  ? Vaping Use: Never used  ?Substance and Sexual Activity  ? Alcohol use: Yes  ?  Alcohol/week: 7.0 standard drinks  ?  Types: 7 Standard drinks or equivalent per week  ?  Comment: 2ozs Building services engineer daily  ? Drug use: No  ? Sexual activity: Not Currently  ?Other Topics Concern  ? Not on file  ?Social History Narrative  ? Not on file  ? ?Social Determinants of Health  ? ?Financial Resource Strain: Not on file  ?Food Insecurity: Not on file  ?Transportation Needs: Not on file  ?Physical Activity: Not on file  ?Stress: Not on file  ?Social Connections: Not on file  ?Intimate Partner Violence: Not on file  ?  ? ?Review of  Systems: ?General: negative for chills, fever, night sweats or weight changes.  ?Cardiovascular: negative for chest pain, dyspnea on exertion, edema, orthopnea, palpitations, paroxysmal nocturnal dyspnea or shortness of breath ?Dermatological: negative for rash ?Respiratory: negative for cough or wheezing ?Urologic: negative for hematuria ?Abdominal: negative for nausea, vomiting, diarrhea, bright red blood per rectum, melena, or hematemesis ?Neurologic: negative for visual changes, syncope, or dizziness ?All other systems reviewed and are otherwise negative except as noted above. ? ? ? ?Blood pressure (!) 160/74, pulse 72, height 6' (1.829 m), weight 173 lb 12.8 oz (78.8 kg), SpO2 98 %.  ?General  appearance: alert and no distress ?Neck: no adenopathy, no carotid bruit, no JVD, supple, symmetrical, trachea midline, and thyroid not enlarged, symmetric, no tenderness/mass/nodules ?Lungs: clear to auscultation bilaterally ?Heart: regular rate and rhythm, S1, S2 normal, no murmur, click, rub or gallop ?Extremities: extremities normal, atraumatic, no cyanosis or edema ?Pulses: 2+ and symmetric ?Skin: Skin color, texture, turgor normal. No rashes or lesions ?Neurologic: Grossly normal ? ?EKG sinus rhythm at 72 with left axis deviation.  I personally reviewed this EKG. ? ?ASSESSMENT AND PLAN:  ? ?Mitral regurgitation, severe ?History of severe mitral regurgitation in the past with his last echo being 02/22/2016 revealing a moderately dilated LV with normal LV systolic function and moderate mitral regurgitation.  He does do calisthenics 3 times a week.  He denies chest pain or shortness of breath. ? ?CAD, single vessel RCA ?History of CAD status post cardiac catheterization performed by myself 04/30/2011 revealing a 50% proximal ramus branch stenosis, 70% mid dominant RCA just after the takeoff of an acute marginal branch.  He is completely asymptomatic. ? ?HTN (hypertension) ?History of essential hypertension blood pressure measured today at 160/74.  He is on diltiazem and lisinopril.  I reviewed his blood pressure log from his home readings which were all within normal range. ? ?Dyslipidemia ?History of dyslipidemia on statin therapy with lipid profile performed by his PCP 02/09/2021 revealing total cholesterol of 143, LDL 69 and HDL 60. ? ? ? ? ?Lorretta Harp MD FACP,FACC,FAHA, FSCAI ?03/14/2021 ?10:43 AM ?

## 2021-03-14 NOTE — Assessment & Plan Note (Signed)
History of essential hypertension blood pressure measured today at 160/74.  He is on diltiazem and lisinopril.  I reviewed his blood pressure log from his home readings which were all within normal range. ?

## 2021-03-14 NOTE — Assessment & Plan Note (Signed)
History of CAD status post cardiac catheterization performed by myself 04/30/2011 revealing a 50% proximal ramus branch stenosis, 70% mid dominant RCA just after the takeoff of an acute marginal branch.  He is completely asymptomatic. ?

## 2021-03-14 NOTE — Patient Instructions (Signed)

## 2021-03-14 NOTE — Assessment & Plan Note (Signed)
History of severe mitral regurgitation in the past with his last echo being 02/22/2016 revealing a moderately dilated LV with normal LV systolic function and moderate mitral regurgitation.  He does do calisthenics 3 times a week.  He denies chest pain or shortness of breath. ?

## 2021-03-22 DIAGNOSIS — D500 Iron deficiency anemia secondary to blood loss (chronic): Secondary | ICD-10-CM | POA: Diagnosis not present

## 2021-04-12 DIAGNOSIS — I83009 Varicose veins of unspecified lower extremity with ulcer of unspecified site: Secondary | ICD-10-CM | POA: Diagnosis not present

## 2021-04-20 ENCOUNTER — Other Ambulatory Visit: Payer: Self-pay

## 2021-04-20 ENCOUNTER — Inpatient Hospital Stay: Payer: PPO | Attending: Internal Medicine | Admitting: Internal Medicine

## 2021-04-20 ENCOUNTER — Inpatient Hospital Stay: Payer: PPO

## 2021-04-20 ENCOUNTER — Encounter: Payer: Self-pay | Admitting: Internal Medicine

## 2021-04-20 VITALS — BP 162/62 | HR 77 | Temp 97.6°F | Resp 18 | Ht 72.0 in | Wt 174.9 lb

## 2021-04-20 DIAGNOSIS — D649 Anemia, unspecified: Secondary | ICD-10-CM | POA: Insufficient documentation

## 2021-04-20 DIAGNOSIS — Z79899 Other long term (current) drug therapy: Secondary | ICD-10-CM | POA: Insufficient documentation

## 2021-04-20 LAB — CBC WITH DIFFERENTIAL (CANCER CENTER ONLY)
Abs Immature Granulocytes: 0.01 10*3/uL (ref 0.00–0.07)
Basophils Absolute: 0 10*3/uL (ref 0.0–0.1)
Basophils Relative: 1 %
Eosinophils Absolute: 0.4 10*3/uL (ref 0.0–0.5)
Eosinophils Relative: 6 %
HCT: 34.3 % — ABNORMAL LOW (ref 39.0–52.0)
Hemoglobin: 11.7 g/dL — ABNORMAL LOW (ref 13.0–17.0)
Immature Granulocytes: 0 %
Lymphocytes Relative: 27 %
Lymphs Abs: 1.7 10*3/uL (ref 0.7–4.0)
MCH: 33.5 pg (ref 26.0–34.0)
MCHC: 34.1 g/dL (ref 30.0–36.0)
MCV: 98.3 fL (ref 80.0–100.0)
Monocytes Absolute: 0.6 10*3/uL (ref 0.1–1.0)
Monocytes Relative: 10 %
Neutro Abs: 3.5 10*3/uL (ref 1.7–7.7)
Neutrophils Relative %: 56 %
Platelet Count: 140 10*3/uL — ABNORMAL LOW (ref 150–400)
RBC: 3.49 MIL/uL — ABNORMAL LOW (ref 4.22–5.81)
RDW: 14.3 % (ref 11.5–15.5)
WBC Count: 6.2 10*3/uL (ref 4.0–10.5)
nRBC: 0 % (ref 0.0–0.2)

## 2021-04-20 LAB — FERRITIN: Ferritin: 59 ng/mL (ref 24–336)

## 2021-04-20 LAB — IRON AND IRON BINDING CAPACITY (CC-WL,HP ONLY)
Iron: 103 ug/dL (ref 45–182)
Saturation Ratios: 33 % (ref 17.9–39.5)
TIBC: 314 ug/dL (ref 250–450)
UIBC: 211 ug/dL (ref 117–376)

## 2021-04-20 NOTE — Progress Notes (Signed)
?    [place] ?Telephone:(336) [phone]   [phone] ? ?OFFICE PROGRESS NOTE ? ?[name], MD ?[place] ?[place] [address] ? ?DIAGNOSIS: Persistent normocytic anemia likely secondary to chronic kidney disease in addition to iron deficiency secondary to colon AV malformation. ? ?PRIOR THERAPY: Iron infusion with Venofer 300 mg IV weekly for 3 weeks.  Last infusion was given in September 2022. ? ?CURRENT THERAPY: None ? ?INTERVAL HISTORY: ?[name] 88 y.o. male returns to the clinic today for follow-up visit accompanied by his wife.  The patient is feeling fine today with no concerning complaints except for very mild fatigue.  He continues to complain of arthralgia and using a cane.  He denied having any chest pain, shortness of breath, cough or hemoptysis.  He denied having any fever or chills.  He has no nausea, vomiting, diarrhea or constipation.  He has no headache or visual changes.  He is not currently on any supplements.  He is here for evaluation and repeat CBC, iron study and ferritin. ? ? ?MEDICAL HISTORY: ?Past Medical History:  ?Diagnosis Date  ? Anemia   ? low hemoglobin  ? Arthritis   ? AV (angiodysplasia malformation of colon)   ? Benign prostatic hypertrophy   ? Cancer [facility])   ? basal cell cancer  ? Chronic kidney disease   ? elevated Cr, BUN, Dr. Hassell Done manages  ? COPD (chronic obstructive pulmonary disease) ([place])   ? Coronary artery disease   ? GERD (gastroesophageal reflux disease)   ? Gout   ? Heart murmur   ? History of kidney stones   ? Hyperlipidemia   ? Hypertension   ? Influenza   ? February 2019  ? Mitral regurgitation   ? Mitral valve prolapse   ? Pneumonia 1970's  ? PONV (postoperative nausea and vomiting)   ? 1st knee replacement  ? Shortness of breath   ? ? ?ALLERGIES:  has No Known Allergies. ? ?MEDICATIONS:  ?Current Outpatient Medications  ?Medication Sig Dispense Refill  ? acetaminophen (TYLENOL) 325 MG tablet Take  650 mg by mouth every 6 (six) hours as needed for mild pain or headache.    ? allopurinol (ZYLOPRIM) 300 MG tablet Take 150 mg by mouth daily.     ? aspirin EC 81 MG tablet Take 1 tablet (81 mg total) by mouth daily. Hold aspirin for now, please discuss with Dr Benson Norway to decide on when to resume. (Patient taking differently: Take 81 mg by mouth See admin instructions. Twice a week on Wednesday's and Sunday's) 30 tablet 0  ? atorvastatin (LIPITOR) 10 MG tablet TAKE 1 TABLET (10 MG TOTAL) BY MOUTH DAILY AT 6 PM. (Patient taking differently: Take 10 mg by mouth every evening.) 90 tablet 3  ? diltiazem (CARDIZEM CD) 300 MG 24 hr capsule Take 300 mg by mouth daily.    ? lisinopril (ZESTRIL) 20 MG tablet Take 20 mg by mouth daily.    ? omeprazole (PRILOSEC OTC) 20 MG tablet Take 20 mg by mouth every other day.    ? polyethylene glycol (MIRALAX / GLYCOLAX) packet Take 17 g by mouth daily as needed for moderate constipation.     ? ?No current facility-administered medications for this visit.  ? ? ?SURGICAL HISTORY:  ?Past Surgical History:  ?Procedure Laterality Date  ? ABDOMINAL ANGIOGRAM  04/30/2011  ? Procedure: ABDOMINAL ANGIOGRAM;  Surgeon: Lorretta Harp, MD;  Location: [facility] CATH LAB;  Service: Cardiovascular;;  ?  CARDIAC CATHETERIZATION  04/2011  ? x2  ? CARDIAC CATHETERIZATION  08/2009  ? CATARACT EXTRACTION  2011  ? bilateral  ? COLONOSCOPY  05/04/2011  ? Procedure: COLONOSCOPY;  Surgeon: Beryle Beams, MD;  [facility];  Service: Endoscopy;  Laterality: N/A;  ? COLONOSCOPY N/A 07/16/2013  ? Procedure: COLONOSCOPY;  Surgeon: Beryle Beams, MD;  Location: [place];  Service: Endoscopy;  Laterality: N/A;  ? COLONOSCOPY WITH PROPOFOL N/A 06/20/2017  ? Procedure: COLONOSCOPY WITH PROPOFOL;  Surgeon: Carol Ada, MD;  Location[facility];  Service: Endoscopy;  Laterality: N/A;  ? CORONARY ANGIOGRAM  04/30/2011  ? Procedure: CORONARY ANGIOGRAM;  Surgeon: Lorretta Harp, MD;  Location: [facility] CATH LAB;   Service: Cardiovascular;;  ? ESOPHAGOGASTRODUODENOSCOPY  05/04/2011  ? Procedure: ESOPHAGOGASTRODUODENOSCOPY (EGD);  Surgeon: Beryle Beams, MD;  Location: [facility];  Service: Endoscopy;  Laterality: N/A;  ? EYE SURGERY    ? bilateral cataracts removed  ? HERNIA REPAIR  1999&2000  ? bilateral  ? LOBECTOMY  1980  ? left lower lobectomy for benign tumor  ? OPEN REDUCTION INTERNAL FIXATION (ORIF) DISTAL RADIAL FRACTURE Left 03/19/2017  ? Procedure: OPEN REDUCTION INTERNAL FIXATION (ORIF) DISTAL RADIAL FRACTURE;  Surgeon: Dorna Leitz, MD;  Location: [place];  Service: Orthopedics;  Laterality: Left;  AXILLARY BLOCK  ? POLYPECTOMY  06/20/2017  ? Procedure: POLYPECTOMY;  Surgeon: Carol Ada, MD;  Location[facility];  Service: Endoscopy;;  ? REPLACEMENT TOTAL KNEE  2005  ? right  ? RIGHT HEART CATHETERIZATION  04/30/2011  ? Procedure: RIGHT HEART CATH;  Surgeon: Lorretta Harp, MD;  Location: [facility] CATH LAB;  Service: Cardiovascular;;  ? TEE WITHOUT CARDIOVERSION  04/30/2011  ? Procedure: TRANSESOPHAGEAL ECHOCARDIOGRAM (TEE);  Surgeon: Sanda Klein, MD;  Location: [facility] ENDOSCOPY;  Service: Cardiovascular;  Laterality: N/A;  3D TEE/ patient will be admitted the day before test , therefo patient will be a inpatient the day of TEE  ? TOTAL KNEE ARTHROPLASTY Left 02/15/2012  ? Procedure: TOTAL KNEE ARTHROPLASTY;  Surgeon: Alta Corning, MD;  Location: [place];  Service: Orthopedics;  Laterality: Left;  ? ? ?REVIEW OF SYSTEMS:  A comprehensive review of systems was negative except for: Constitutional: positive for fatigue  ? ?PHYSICAL EXAMINATION: General appearance: alert, cooperative, fatigued, and no distress ?Head: Normocephalic, without obvious abnormality, atraumatic ?Neck: no adenopathy, no JVD, supple, symmetrical, trachea midline, and thyroid not enlarged, symmetric, no tenderness/mass/nodules ?Lymph nodes: Cervical, supraclavicular, and axillary nodes normal. ?Resp: clear to auscultation bilaterally ?Back: symmetric,  no curvature. ROM normal. No CVA tenderness. ?Cardio: regular rate and rhythm, S1, S2 normal, no murmur, click, rub or gallop ?GI: soft, non-tender; bowel sounds normal; no masses,  no organomegaly ?Extremities: extremities normal, atraumatic, no cyanosis or edema ? ?ECOG PERFORMANCE STATUS: 1 - Symptomatic but completely ambulatory ? ?Blood pressure (!) 162/62, pulse 77, temperature 97.6 ?F (36.4 ?C), temperature source Oral, resp. rate 18, height 6' (1.829 m), weight 174 lb 14.4 oz (79.3 kg), SpO2 97 %. ? ?LABORATORY DATA: ?Lab Results  ?Component Value Date  ? WBC 6.2 04/20/2021  ? HGB 11.7 (L) 04/20/2021  ? HCT 34.3 (L) 04/20/2021  ? MCV 98.3 04/20/2021  ? PLT 140 (L) 04/20/2021  ? ? ?  Chemistry   ?   ?Component Value Date/Time  ? NA 141 08/17/2020 1119  ? K 4.9 08/17/2020 1119  ? CL 110 08/17/2020 1119  ? CO2 20 (L) 08/17/2020 1119  ? BUN 45 (H) 08/17/2020 1119  ?  CREATININE 1.86 (H) 08/17/2020 1119  ?    ?Component Value Date/Time  ? CALCIUM 9.6 08/17/2020 1119  ? ALKPHOS 116 08/17/2020 1119  ? AST 16 08/17/2020 1119  ? ALT 8 08/17/2020 1119  ? BILITOT 0.4 08/17/2020 1119  ?  ? ? ? ?RADIOGRAPHIC STUDIES: ?No results found. ? ?ASSESSMENT AND PLAN: This is a very pleasant 88 years old white male with history of persistent normocytic anemia likely secondary to chronic kidney disease in addition to iron deficiency secondary to colon AV malformation. ?The patient was treated with iron infusion with Venofer 300 mg IV weekly for 3 weeks and he tolerated his infusion well.  His last infusion was in September 2022. ?The patient is currently on observation and he is feeling fine with no concerning complaints except for mild fatigue. ?Repeat CBC today showed hemoglobin of 11.7 and hematocrit 34.3%.  He also has low platelets count of 140,000. ?Iron study is unremarkable with normal serum iron and saturation.  Ferritin level is still pending. ?I recommended for the patient to continue on observation with repeat CBC, iron  study and ferritin in 4 months. ?The patient was advised to call immediately if he has any other concerning symptoms in the interval. ?The patient voices understanding of current disease status and treatm

## 2021-04-26 ENCOUNTER — Encounter (HOSPITAL_BASED_OUTPATIENT_CLINIC_OR_DEPARTMENT_OTHER): Payer: PPO | Attending: Physician Assistant | Admitting: Physician Assistant

## 2021-04-26 DIAGNOSIS — L97512 Non-pressure chronic ulcer of other part of right foot with fat layer exposed: Secondary | ICD-10-CM | POA: Insufficient documentation

## 2021-04-26 DIAGNOSIS — I10 Essential (primary) hypertension: Secondary | ICD-10-CM | POA: Insufficient documentation

## 2021-04-26 DIAGNOSIS — I2510 Atherosclerotic heart disease of native coronary artery without angina pectoris: Secondary | ICD-10-CM | POA: Insufficient documentation

## 2021-04-26 DIAGNOSIS — I872 Venous insufficiency (chronic) (peripheral): Secondary | ICD-10-CM | POA: Diagnosis not present

## 2021-04-26 NOTE — Progress Notes (Signed)
USTIN, CRUICKSHANK B. ([id number]) ?Visit Report for 04/26/2021 ?Abuse Risk Screen Details ?Patient Name: Date of Service: ?[name], Tyler YBO RNE B. 04/26/2021 8:00 A M ?Medical Record Number: [id number] ?Patient Account Number: [id number] ?Date of Birth/Sex: Treating RN: ?13-Oct-1925 (88 y.o. [name], [name] ?Primary Care Ameya Kutz: [name] Other Clinician: ?Referring Teri Diltz: ?Treating Sholanda Croson/Extender: [name] ?[name] ?Weeks in Treatment: 0 ?Abuse Risk Screen Items ?Answer ?ABUSE RISK SCREEN: ?Has anyone close to you tried to hurt or harm you recentlyo No ?Do you feel uncomfortable with anyone in your familyo No ?Has anyone forced you do things that you didnt want to doo No ?Electronic Signature(s) ?Signed: 04/26/2021 3:30:56 PM By: [name] ?Entered By: Rhae Hammock on 04/26/2021 08:11:01 ?-------------------------------------------------------------------------------- ?Activities of Daily Living Details ?Patient Name: Date of Service: ?[name], Tyler YBO RNE B. 04/26/2021 8:00 A M ?Medical Record Number: [id number] ?Patient Account Number: [id number] ?Date of Birth/Sex: Treating RN: ?April 29, 1925 (88 y.o. [name], [name] ?Primary Care Tisa Weisel: [name] Other Clinician: ?Referring Gurkaran Rahm: ?Treating Lucerito Rosinski/Extender: [name] ?[name] ?Weeks in Treatment: 0 ?Activities of Daily Living Items ?Answer ?Activities of Daily Living (Please select one for each item) ?[place] ?T Medications ?ake Completely Able ?Use T elephone Completely Able ?Care for Appearance Completely Able ?Use T oilet Completely Able ?Bath / Shower Completely Able ?Dress Self Completely Able ?Feed Self Completely Able ?Walk Completely Able ?Get In / Out Bed Completely Able ?Housework Completely Able ?Prepare Meals Completely Able ?Handle Money Completely Able ?Shop for Self Completely Able ?Electronic Signature(s) ?Signed: 04/26/2021 3:30:56 PM By:  [name] ?Entered By: Rhae Hammock on 04/26/2021 08:11:20 ?-------------------------------------------------------------------------------- ?Education Screening Details ?Patient Name: ?Date of Service: ?[name], Tyler YBO RNE B. 04/26/2021 8:00 A M ?Medical Record Number: [id number] ?Patient Account Number: [id number] ?Date of Birth/Sex: ?Treating RN: ?29-Nov-1925 (87 y.o. [name], [name] ?Primary Care Elga Santy: [name] ?Other Clinician: ?Referring Hugh Garrow: ?Treating Ilias Stcharles/Extender: [name] ?[name] ?Weeks in Treatment: 0 ?Primary Learner Assessed: Patient ?Learning Preferences/Education Level/Primary Language ?Learning Preference: Explanation, Demonstration, Communication Board, Printed Material ?Highest Education Level: College or Above ?Preferred Language: English ?Cognitive Barrier ?Language Barrier: No ?Translator Needed: No ?Memory Deficit: No ?Emotional Barrier: No ?Cultural/Religious Beliefs Affecting Medical Care: No ?Physical Barrier ?Impaired Vision: Yes Glasses ?Impaired Hearing: No ?Decreased Hand dexterity: No ?Knowledge/Comprehension ?Knowledge Level: High ?Comprehension Level: High ?Ability to understand written instructions: High ?Ability to understand verbal instructions: High ?Motivation ?Anxiety Level: Calm ?Cooperation: Cooperative ?Education Importance: Denies Need ?Interest in Health Problems: Asks Questions ?Perception: Coherent ?Willingness to Engage in Self-Management High ?Activities: ?Readiness to Engage in Self-Management High ?Activities: ?Electronic Signature(s) ?Signed: 04/26/2021 3:30:56 PM By: [name] ?Entered By: Rhae Hammock on 04/26/2021 08:11:46 ?-------------------------------------------------------------------------------- ?Fall Risk Assessment Details ?Patient Name: ?Date of Service: ?[name], Tyler YBO RNE B. 04/26/2021 8:00 A M ?Medical Record Number: [id number] ?Patient Account Number: [id number] ?Date of  Birth/Sex: ?Treating RN: ?1925/07/14 (88 y.o. [name], [name] ?Primary Care Burdett Pinzon: [name] ?Other Clinician: ?Referring Priyah Schmuck: ?Treating Sosaia Pittinger/Extender: [name] ?[name] ?Weeks in Treatment: 0 ?Fall Risk Assessment Items ?Have you had 2 or more falls in the last 12 monthso 0 No ?Have you had any fall that resulted in injury in the last 12 monthso 0 No ?FALLS RISK SCREEN ?History of falling - immediate or within 3 months 0 No ?Secondary diagnosis (Do you have 2 or more medical diagnoseso) 0 No ?Ambulatory aid ?None/bed rest/wheelchair/nurse 0 No ?Crutches/cane/walker 0 No ?Furniture 0 No ?  Intravenous therapy Access/Saline/Heparin Lock 0 No ?Gait/Transferring ?Normal/ bed rest/ wheelchair 0 No ?Weak (short steps with or without shuffle, stooped but able to lift head while walking, may seek 0 No ?support from furniture) ?Impaired (short steps with shuffle, may have difficulty arising from chair, head down, impaired 0 No ?balance) ?Mental Status ?Oriented to own ability 0 No ?Electronic Signature(s) ?Signed: 04/26/2021 3:30:56 PM By: [name] ?Entered By: Rhae Hammock on 04/26/2021 08:11:52 ?-------------------------------------------------------------------------------- ?Foot Assessment Details ?Patient Name: ?Date of Service: ?[name], Tyler YBO RNE B. 04/26/2021 8:00 A M ?Medical Record Number: [id number] ?Patient Account Number: [id number] ?Date of Birth/Sex: ?Treating RN: ?1925-10-26 (88 y.o. [name], [name] ?Primary Care Jonelle Bann: [name] ?Other Clinician: ?Referring Al Bracewell: ?Treating Yosef Krogh/Extender: [name] ?[name] ?Weeks in Treatment: 0 ?Foot Assessment Items ?Site Locations ?+ = Sensation present, - = Sensation absent, C = Callus, U = Ulcer ?R = Redness, W = Warmth, M = Maceration, PU = Pre-ulcerative lesion ?F = Fissure, S = Swelling, D = Dryness ?Assessment ?Right: Left: ?Other Deformity: No No ?Prior Foot Ulcer: No  No ?Prior Amputation: No No ?Charcot Joint: No No ?Ambulatory Status: ?Gait: ?Electronic Signature(s) ?Signed: 04/26/2021 3:30:56 PM By: [name] ?Entered By: Rhae Hammock on 04/26/2021 08:50:15 ?-------------------------------------------------------------------------------- ?Nutrition Risk Screening Details ?Patient Name: ?Date of Service: ?[name], Tyler YBO RNE B. 04/26/2021 8:00 A M ?Medical Record Number: [id number] ?Patient Account Number: [id number] ?Date of Birth/Sex: ?Treating RN: ?29-Oct-1925 (88 y.o. [name], [name] ?Primary Care Belina Mandile: [name] ?Other Clinician: ?Referring Kayshawn Ozburn: ?Treating Lyanne Kates/Extender: [name] ?[name] ?Weeks in Treatment: 0 ?Height (in): 72 ?Weight (lbs): 170 ?Body Mass Index (BMI): 23.1 ?Nutrition Risk Screening Items ?Score Screening ?NUTRITION RISK SCREEN: ?I have an illness or condition that made me change the kind and/or amount of food I eat 0 No ?I eat fewer than two meals per day 0 No ?I eat few fruits and vegetables, or milk products 0 No ?I have three or more drinks of beer, liquor or wine almost every day 0 No ?I have tooth or mouth problems that make it hard for me to eat 0 No ?I don't always have enough money to buy the food I need 0 No ?I eat alone most of the time 0 No ?I take three or more different prescribed or over-the-counter drugs a day 0 No ?Without wanting to, I have lost or gained 10 pounds in the last six months 0 No ?I am not always physically able to shop, cook and/or feed myself 0 No ?Nutrition Protocols ?Good Risk Protocol 0 No interventions needed ?Moderate Risk Protocol ?High Risk Proctocol ?Risk Level: Good Risk ?Score: 0 ?Electronic Signature(s) ?Signed: 04/26/2021 3:30:56 PM By: [name] ?Entered By: Rhae Hammock on 04/26/2021 08:11:58 ?

## 2021-04-26 NOTE — Progress Notes (Signed)
WYLAND, RASTETTER B. ([id number]) ?Visit Report for 04/26/2021 ?Chief Complaint Document Details ?Patient Name: Date of Service: ?[name], [name] 04/26/2021 8:00 A M ?Medical Record Number: [id number] ?Patient Account Number: [id number] ?Date of Birth/Sex: Treating RN: ?01-22-1925 (88 y.o. [name], [name] ?Primary Care Provider: [name] Other Clinician: ?Referring Provider: ?Treating Provider/Extender: [name] ?[name] ?Weeks in Treatment: 0 ?Information Obtained from: Patient ?Chief Complaint ?Right dorsal foot ulcer ?Electronic Signature(s) ?Signed: 04/26/2021 8:45:23 AM By: [name] PA-C ?Entered By: [name] on 04/26/2021 08:45:23 ?-------------------------------------------------------------------------------- ?Debridement Details ?Patient Name: Date of Service: ?[name], [name] 04/26/2021 8:00 A M ?Medical Record Number: [id number] ?Patient Account Number: [id number] ?Date of Birth/Sex: Treating RN: ?1925-04-14 (88 y.o. [name], [name] ?Primary Care Provider: [name] Other Clinician: ?Referring Provider: ?Treating Provider/Extender: [name] ?[name] ?Weeks in Treatment: 0 ?Debridement Performed for Assessment: Wound #1 Right,Dorsal Foot ?Performed By: Physician [name], PA ?Debridement Type: Debridement ?Level of Consciousness (Pre-procedure): Awake and Alert ?Pre-procedure Verification/Time Out Yes - 08:50 ?Taken: ?Start Time: 08:50 ?Pain Control: Lidocaine ?T Area Debrided (L x W): ?otal 2.2 (cm) x 2.5 (cm) = 5.5 (cm?) ?Tissue and other material debrided: Viable, Non-Viable, Slough, Subcutaneous, Slough ?Level: Skin/Subcutaneous Tissue ?Debridement Description: Excisional ?Instrument: Curette ?Bleeding: Minimum ?Hemostasis Achieved: Pressure ?End Time: 08:50 ?Procedural Pain: 0 ?Post Procedural Pain: 0 ?Response to Treatment: Procedure was tolerated well ?Level of Consciousness (Post- Awake and  Alert ?procedure): ?Post Debridement Measurements of Total Wound ?Length: (cm) 2.2 ?Width: (cm) 2.5 ?Depth: (cm) 0.2 ?Volume: (cm?) 0.864 ?Character of Wound/Ulcer Post Debridement: Improved ?Post Procedure Diagnosis ?Same as Pre-procedure ?Electronic Signature(s) ?Signed: 04/26/2021 3:30:56 PM By: [name] ?Signed: 04/26/2021 4:00:03 PM By: [name] PA-C ?Entered By: Rhae Hammock on 04/26/2021 08:53:26 ?-------------------------------------------------------------------------------- ?HPI Details ?Patient Name: Date of Service: ?[name], [name] 04/26/2021 8:00 A M ?Medical Record Number: [id number] ?Patient Account Number: [id number] ?Date of Birth/Sex: Treating RN: ?October 01, 1925 (88 y.o. [name], [name] ?Primary Care Provider: [name] Other Clinician: ?Referring Provider: ?Treating Provider/Extender: [name] ?[name] ?Weeks in Treatment: 0 ?History of Present Illness ?HPI Description: 04-26-2021 upon evaluation today patient presents for initial inspection here in the clinic has been having an issue with a dorsal foot wound on ?the right foot for the past year. He tells me at this time that he does not even really know what brought this on. Fortunately he does not seem to have any ?signs of infection here and has been given some acting medication which was benzyl peroxide forward from dermatology that really has not helped. He has not ?had anything really to put on it from a dressing standpoint other than just utilizing some Neosporin over-the-counter for the most part. Nonetheless he figured ?he would come get this checked out since have seen his wife before and we have been able to get her healed he is hoping we can do the same here. ?Patient does have a history of hypertension, chronic venous insufficiency though he does wear compression socks that he does not appear to be swollen at ?this time. He also has a history of coronary artery disease. ?Electronic  Signature(s) ?Signed: 04/26/2021 10:26:49 AM By: [name] PA-C ?Entered By: [name] on 04/26/2021 10:26:48 ?-------------------------------------------------------------------------------- ?Physical Exam Details ?Patient Name: Date of Service: ?[name], [name] 04/26/2021 8:00 A M ?Medical Record Number: [id number] ?Patient Account Number: [id number] ?Date of Birth/Sex: Treating RN: ?04-24-1925 (  88 y.o. [name], [name] ?Primary Care Provider: [name] Other Clinician: ?Referring Provider: ?Treating Provider/Extender: [name] ?[name] ?Weeks in Treatment: 0 ?Constitutional ?patient is hypertensive.. pulse regular and within target range for patient.[name] respirations regular, non-labored and within target range for patient.[name] temperature ?within target range for patient.. Well-nourished and well-hydrated in no acute distress. ?Eyes ?conjunctiva clear no eyelid edema noted. pupils equal round and reactive to light and accommodation. ?Ears, Nose, Mouth, and Throat ?no gross abnormality of ear auricles or external auditory canals. normal hearing noted during conversation. mucus membranes moist. ?Respiratory ?normal breathing without difficulty. ?Cardiovascular ?2+ dorsalis pedis/posterior tibialis pulses. 1+ pitting edema of the bilateral lower extremities. ?Musculoskeletal ?normal gait and posture. no significant deformity or arthritic changes, no loss or range of motion, no clubbing. ?Psychiatric ?this patient is able to make decisions and demonstrates good insight into disease process. Alert and Oriented x 3. pleasant and cooperative. ?Notes ?Upon inspection based on what I am seeing today I do believe that the patient is actually showing signs of decent granulation and epithelization. There was ?some slough and biofilm noted I did perform sharp debridement to clear away some of the necrotic debris and the patient tolerated this today without ?complication. Postdebridement  the wound bed appears to be doing significantly better which is great news. He does have some swelling but nothing too ?significant here which is also excellent news. He seems to have excellent pulses in the pedal area DP and PT He does wear compression socks. ?. ?Electronic Signature(s) ?Signed: 04/26/2021 3:53:12 PM By: [name] PA-C ?Entered By: [name] on 04/26/2021 15:53:12 ?-------------------------------------------------------------------------------- ?Physician Orders Details ?Patient Name: Date of Service: ?[name], [name] 04/26/2021 8:00 A M ?Medical Record Number: [id number] ?Patient Account Number: [id number] ?Date of Birth/Sex: Treating RN: ?[birth year] (88 y.o. [name], [name] ?Primary Care Provider: [name] Other Clinician: ?Referring Provider: ?Treating Provider/Extender: [name] ?[name] ?Weeks in Treatment: 0 ?Verbal / Phone Orders: No ?Diagnosis Coding ?ICD-10 Coding ?Code Description ?L98.8 Other specified disorders of the skin and subcutaneous tissue ?L97.512 Non-pressure chronic ulcer of other part of right foot with fat layer exposed ?I25.10 Atherosclerotic heart disease of native coronary artery without angina pectoris ?I10 Essential (primary) hypertension ?I87.2 Venous insufficiency (chronic) (peripheral) ?Follow-up Appointments ?ppointment in 1 week. Margarita Grizzle [address] and [place] [address] ?one ?Return A ?Bathing/ Shower/ Hygiene ?May shower with protection but do not get wound dressing(s) wet. ?Edema Control - Lymphedema / SCD / Other ?Elevate legs to the level of the heart or above for 30 minutes daily and/or when sitting, a frequency of: ?Avoid standing for long periods of time. ?Patient to wear own compression stockings every day. ?Wound Treatment ?Wound #1 - Foot Wound Laterality: Dorsal, Right ?Cleanser: Soap and Water Every Other Day/15 Days ?Discharge Instructions: May shower and wash wound with dial antibacterial soap and water prior to  dressing change. ?Cleanser: Wound Cleanser (DME) (Generic) Every Other Day/15 Days ?Discharge Instructions: Cleanse the wound with wound cleanser prior to applying a clean dressing using gauze sponges, not tissue or cotton

## 2021-04-26 NOTE — Progress Notes (Signed)
[name], Tyler B. ([id number]) ?Visit Report for 04/26/2021 ?Allergy List Details ?Patient Name: Date of Service: ?[name], [name] 04/26/2021 8:00 A M ?Medical Record Number: [id number] ?Patient Account Number: [id number] ?Date of Birth/Sex: Treating RN: ?01-02-1926 (88 y.o. [name], [name] ?Primary Care Katilynn Sinkler: [name] Other Clinician: ?Referring Sheryll Dymek: ?Treating Solomia Harrell/Extender: [name] ?[name] ?Weeks in Treatment: 0 ?Allergies ?Active Allergies ?No Known Allergies ?Allergy Notes ?Electronic Signature(s) ?Signed: 04/26/2021 3:30:56 PM By: [name] ?Entered By: Rhae Hammock on 04/26/2021 08:10:15 ?-------------------------------------------------------------------------------- ?Clinic Level of Care Assessment Details ?Patient Name: Date of Service: ?[name], [name] 04/26/2021 8:00 A M ?Medical Record Number: [id number] ?Patient Account Number: [id number] ?Date of Birth/Sex: Treating RN: ?1925-11-06 (88 y.o. [name], [name] ?Primary Care Halea Lieb: [name] Other Clinician: ?Referring Ami Thornsberry: ?Treating Miriam Kestler/Extender: [name] ?[name] ?Weeks in Treatment: 0 ?Clinic Level of Care Assessment Items ?TOOL 3 Quantity Score ?X- 1 0 ?Use when EandM and Procedure is performed on FOLLOW-UP visit ?ASSESSMENTS - Nursing Assessment / Reassessment ?X- 1 10 ?Reassessment of Co-morbidities (includes updates in patient status) ?X- 1 5 ?Reassessment of Adherence to Treatment Plan ?ASSESSMENTS - Wound and Skin Assessment / Reassessment ?'[]'$  - Points for Wound Assessment can only be taken for a new wound of unknown or different etiology and a procedure is ?0 ?NOT performed to that wound ?X- 1 5 ?Simple Wound Assessment / Reassessment - one wound ?'[]'$  - 0 ?Complex Wound Assessment / Reassessment - multiple wounds ?'[]'$  - 0 ?Dermatologic / Skin Assessment (not related to wound area) ?ASSESSMENTS - Focused Assessment ?X- 1 5 ?Circumferential  Edema Measurements - multi extremities ?'[]'$  - 0 ?Nutritional Assessment / Counseling / Intervention ?'[]'$  - 0 ?Lower Extremity Assessment (monofilament, tuning fork, pulses) ?'[]'$  - 0 ?Peripheral Arterial Disease Assessment (using hand held doppler) ?ASSESSMENTS - Ostomy and/or Continence Assessment and Care ?'[]'$  - 0 ?Incontinence Assessment and Management ?'[]'$  - 0 ?Ostomy Care Assessment and Management (repouching, etc.) ?PROCESS - Coordination of Care ?'[]'$  - Points for Discharge Coordination can only be taken for a new wound of unknown or different etiology and a ?0 ?procedure is NOT performed to that wound ?X- 1 15 ?Simple Patient / Family Education for ongoing care ?'[]'$  - 0 ?Complex (extensive) Patient / Family Education for ongoing care ?X- 1 10 ?Staff obtains Consents, Records, T Results / Process Orders ?est ?'[]'$  - 0 ?Staff telephones HHA, Nursing Homes / Clarify orders / etc ?'[]'$  - 0 ?Routine Transfer to another Facility (non-emergent condition) ?'[]'$  - 0 ?Routine Hospital Admission (non-emergent condition) ?X- 1 15 ?New Admissions / Biomedical engineer / Ordering NPWT Apligraf, etc. ?, ?'[]'$  - 0 ?Emergency Hospital Admission (emergent condition) ?X- 1 10 ?Simple Discharge Coordination ?'[]'$  - 0 ?Complex (extensive) Discharge Coordination ?PROCESS - Special Needs ?'[]'$  - 0 ?Pediatric / Minor Patient Management ?'[]'$  - 0 ?Isolation Patient Management ?'[]'$  - 0 ?Hearing / Language / Visual special needs ?'[]'$  - 0 ?Assessment of Community assistance (transportation, D/C planning, etc.) ?'[]'$  - 0 ?Additional assistance / Altered mentation ?'[]'$  - 0 ?Support Surface(s) Assessment (bed, cushion, seat, etc.) ?INTERVENTIONS - Wound Cleansing / Measurement ?'[]'$  - Points for Wound Cleaning / Measurement, Wound Dressing, Specimen Collection and Specimen taken to lab can ?0 ?only be taken for a new wound of unknown or different etiology and a procedure is NOT performed to that wound ?X- 1 5 ?Simple Wound Cleansing - one wound ?'[]'$  - 0 ?Complex  Wound Cleansing - multiple wounds ?X- 1  5 ?Wound Imaging (photographs - any number of wounds) ?'[]'$  - 0 ?Wound Tracing (instead of photographs) ?X- 1 5 ?Simple Wound Measurement - one wound ?'[]'$  - 0 ?Complex Wound Measurement - multiple wounds ?INTERVENTIONS - Wound Dressings ?X - Small Wound Dressing one or multiple wounds 1 10 ?'[]'$  - 0 ?Medium Wound Dressing one or multiple wounds ?'[]'$  - 0 ?Large Wound Dressing one or multiple wounds ?INTERVENTIONS - Miscellaneous ?'[]'$  - 0 ?External ear exam ?'[]'$  - 0 ?Specimen Collection (cultures, biopsies, blood, body fluids, etc.) ?'[]'$  - 0 ?Specimen(s) / Culture(s) sent or taken to Lab for analysis ?'[]'$  - 0 ?Patient Transfer (multiple staff / Civil Service fast streamer / Similar devices) ?'[]'$  - 0 ?Simple Staple / Suture removal (25 or less) ?'[]'$  - 0 ?Complex Staple / Suture removal (26 or more) ?'[]'$  - 0 ?Hypo / Hyperglycemic Management (close monitor of Blood Glucose) ?'[]'$  - 0 ?Ankle / Brachial Index (ABI) - do not check if billed separately ?X- 1 5 ?Vital Signs ?Has the patient been seen at the hospital within the last three years: Yes ?Total Score: 105 ?Level Of Care: New/Established - Level 3 ?Electronic Signature(s) ?Signed: 04/26/2021 3:30:56 PM By: [name] ?Entered By: Rhae Hammock on 04/26/2021 09:04:55 ?-------------------------------------------------------------------------------- ?Encounter Discharge Information Details ?Patient Name: ?Date of Service: ?[name], [name] 04/26/2021 8:00 A M ?Medical Record Number: [id number] ?Patient Account Number: [id number] ?Date of Birth/Sex: ?Treating RN: ?07/21/1925 (88 y.o. [name], [name] ?Primary Care Kaylan Yates: [name] ?Other Clinician: ?Referring Erby Sanderson: ?Treating Teighlor Korson/Extender: [name] ?[name] ?Weeks in Treatment: 0 ?Encounter Discharge Information Items Post Procedure Vitals ?Discharge Condition: Stable ?Temperature (F): 97.4 ?Ambulatory Status: Ambulatory ?Pulse (bpm): 74 ?Discharge  Destination: Home ?Respiratory Rate (breaths/min): 17 ?Transportation: Private Auto ?Blood Pressure (mmHg): 154/74 ?Accompanied By: wife ?Schedule Follow-up Appointment: Yes ?Clinical Summary of Care: Patient Declined ?Electronic Signature(s) ?Signed: 04/26/2021 3:30:56 PM By: [name] ?Entered By: Rhae Hammock on 04/26/2021 09:05:53 ?-------------------------------------------------------------------------------- ?Lower Extremity Assessment Details ?Patient Name: ?Date of Service: ?[name], [name] 04/26/2021 8:00 A M ?Medical Record Number: [id number] ?Patient Account Number: [id number] ?Date of Birth/Sex: ?Treating RN: ?1925-07-25 (88 y.o. [name], [name] ?Primary Care Angelamarie Avakian: [name] ?Other Clinician: ?Referring Daruis Swaim: ?Treating Janeya Deyo/Extender: [name] ?[name] ?Weeks in Treatment: 0 ?Edema Assessment ?Assessed: [Left: No] [Right: Yes] ?Edema: [Left: Ye] [Right: s] ?Calf ?Left: Right: ?Point of Measurement: 33 cm From Medial Instep 38 cm ?Ankle ?Left: Right: ?Point of Measurement: 8 cm From Medial Instep 21 cm ?Vascular Assessment ?Pulses: ?Dorsalis Pedis ?Palpable: [Right:Yes] ?Posterior Tibial ?Palpable: [Right:Yes] ?Electronic Signature(s) ?Signed: 04/26/2021 3:30:56 PM By: [name] ?Entered By: Rhae Hammock on 04/26/2021 08:16:30 ?-------------------------------------------------------------------------------- ?Multi-Disciplinary Care Plan Details ?Patient Name: ?Date of Service: ?[name], [name] 04/26/2021 8:00 A M ?Medical Record Number: [id number] ?Patient Account Number: [id number] ?Date of Birth/Sex: ?Treating RN: ?Jun 07, 1925 (88 y.o. [name], [name] ?Primary Care Conchita Truxillo: [name] ?Other Clinician: ?Referring Josanna Hefel: ?Treating Donnavin Vandenbrink/Extender: [name] ?[name] ?Weeks in Treatment: 0 ?Active Inactive ?Orientation to the Wound Care Program ?Nursing Diagnoses: ?Knowledge deficit related  to the wound healing center program ?Goals: ?Patient/caregiver will verbalize understanding of the [place] ?Date Initiated: 04/26/2021 ?Target Resolution Date: 05/04/2021 ?Goal Status: Activ

## 2021-04-28 DIAGNOSIS — L97512 Non-pressure chronic ulcer of other part of right foot with fat layer exposed: Secondary | ICD-10-CM | POA: Diagnosis not present

## 2021-05-03 ENCOUNTER — Encounter (HOSPITAL_BASED_OUTPATIENT_CLINIC_OR_DEPARTMENT_OTHER): Payer: PPO | Attending: Physician Assistant | Admitting: Physician Assistant

## 2021-05-03 DIAGNOSIS — L988 Other specified disorders of the skin and subcutaneous tissue: Secondary | ICD-10-CM | POA: Diagnosis not present

## 2021-05-03 DIAGNOSIS — I2510 Atherosclerotic heart disease of native coronary artery without angina pectoris: Secondary | ICD-10-CM | POA: Insufficient documentation

## 2021-05-03 DIAGNOSIS — I10 Essential (primary) hypertension: Secondary | ICD-10-CM | POA: Diagnosis not present

## 2021-05-03 DIAGNOSIS — L97512 Non-pressure chronic ulcer of other part of right foot with fat layer exposed: Secondary | ICD-10-CM | POA: Insufficient documentation

## 2021-05-03 DIAGNOSIS — I872 Venous insufficiency (chronic) (peripheral): Secondary | ICD-10-CM | POA: Insufficient documentation

## 2021-05-03 NOTE — Progress Notes (Addendum)
DIAMANTE, RUBIN B. ([id number]) ?Visit Report for 05/03/2021 ?Chief Complaint Document Details ?Patient Name: Date of Service: ?[name], [name] 05/03/2021 12:00 PM ?Medical Record Number: [id number] ?Patient Account Number: [id number] ?Date of Birth/Sex: Treating RN: ?10-20-1925 (88 y.o. [name], [name] ?Primary Care Provider: [name] Other Clinician: ?Referring Provider: ?Treating Provider/Extender: [name] ?[name] ?Weeks in Treatment: 1 ?Information Obtained from: Patient ?Chief Complaint ?Right dorsal foot ulcer ?Electronic Signature(s) ?Signed: 05/03/2021 12:13:23 PM By: [name] PA-C ?Entered By: [name] on 05/03/2021 12:13:22 ?-------------------------------------------------------------------------------- ?HPI Details ?Patient Name: Date of Service: ?[name], [name] 05/03/2021 12:00 PM ?Medical Record Number: [id number] ?Patient Account Number: [id number] ?Date of Birth/Sex: Treating RN: ?08-02-1925 (88 y.o. [name], [name] ?Primary Care Provider: [name] Other Clinician: ?Referring Provider: ?Treating Provider/Extender: [name] ?[name] ?Weeks in Treatment: 1 ?History of Present Illness ?HPI Description: 04-26-2021 upon evaluation today patient presents for initial inspection here in the clinic has been having an issue with a dorsal foot wound on ?the right foot for the past year. He tells me at this time that he does not even really know what brought this on. Fortunately he does not seem to have any ?signs of infection here and has been given some acting medication which was benzyl peroxide forward from dermatology that really has not helped. He has not ?had anything really to put on it from a dressing standpoint other than just utilizing some Neosporin over-the-counter for the most part. Nonetheless he figured ?he would come get this checked out since have seen his wife before and we have been able to get her healed he is  hoping we can do the same here. ?Patient does have a history of hypertension, chronic venous insufficiency though he does wear compression socks that he does not appear to be swollen at ?this time. He also has a history of coronary artery disease. ?05-03-2021 upon evaluation today patient appears to be doing well with regard to the wound on the top of his foot this is actually showing signs of excellent ?improvement which is great news and overall I am extremely pleased with where we stand today. There does not appear to be any evidence of active infection ?locally or systemically which is great news. ?Electronic Signature(s) ?Signed: 05/03/2021 1:43:33 PM By: [name] PA-C ?Entered By: [name] on 05/03/2021 13:43:33 ?-------------------------------------------------------------------------------- ?Physical Exam Details ?Patient Name: Date of Service: ?[name], [name] 05/03/2021 12:00 PM ?Medical Record Number: [id number] ?Patient Account Number: [id number] ?Date of Birth/Sex: Treating RN: ?1925-11-24 (88 y.o. [name], [name] ?Primary Care Provider: [name] Other Clinician: ?Referring Provider: ?Treating Provider/Extender: [name] ?[name] ?Weeks in Treatment: 1 ?Constitutional ?Well-nourished and well-hydrated in no acute distress. ?Respiratory ?normal breathing without difficulty. ?Psychiatric ?this patient is able to make decisions and demonstrates good insight into disease process. Alert and Oriented x 3. pleasant and cooperative. ?Notes ?Upon inspection patient's wound bed showed evidence of good granulation and epithelization at this point. Fortunately there does not appear to be any evidence ?of active infection locally or systemically which is great news and overall I definitely think that we are headed in the right direction here. ?Electronic Signature(s) ?Signed: 05/03/2021 1:43:53 PM By: [name] PA-C ?Entered By: [name] on 05/03/2021  13:43:52 ?-------------------------------------------------------------------------------- ?Physician Orders Details ?Patient Name: Date of Service: ?[name], [name] 05/03/2021 12:00 PM ?Medical Record Number: [id number] ?Patient Account Number: [id number] ?  Date of Birth/Sex: Treating RN: ?06/21/1925 (88 y.o. [name], [name] ?Primary Care Provider: [name] Other Clinician: ?Referring Provider: ?Treating Provider/Extender: [name] ?[name] ?Weeks in Treatment: 1 ?Verbal / Phone Orders: No ?Diagnosis Coding ?ICD-10 Coding ?Code Description ?L98.8 Other specified disorders of the skin and subcutaneous tissue ?L97.512 Non-pressure chronic ulcer of other part of right foot with fat layer exposed ?I25.10 Atherosclerotic heart disease of native coronary artery without angina pectoris ?I10 Essential (primary) hypertension ?I87.2 Venous insufficiency (chronic) (peripheral) ?Follow-up Appointments ?ppointment in 1 week. Margarita Grizzle ST and [place] # 9 ?one ?Return A ?Bathing/ Shower/ Hygiene ?May shower with protection but do not get wound dressing(s) wet. ?Edema Control - Lymphedema / SCD / Other ?Elevate legs to the level of the heart or above for 30 minutes daily and/or when sitting, a frequency of: ?Avoid standing for long periods of time. ?Patient to wear own compression stockings every day. ?Wound Treatment ?Wound #1 - Foot Wound Laterality: Dorsal, Right ?Cleanser: Soap and Water Every Other Day/15 Days ?Discharge Instructions: May shower and wash wound with dial antibacterial soap and water prior to dressing change. ?Cleanser: Wound Cleanser (Generic) Every Other Day/15 Days ?Discharge Instructions: Cleanse the wound with wound cleanser prior to applying a clean dressing using gauze sponges, not tissue or cotton balls. ?Prim Dressing: Promogran Prisma Matrix, 4.34 (sq in) (silver collagen) (Generic) Every Other Day/15 Days ?ary ?Discharge Instructions: Moisten collagen with saline or  hydrogel ?Secondary Dressing: Bordered Gauze, 4x4 in (Generic) Every Other Day/15 Days ?Discharge Instructions: Apply over primary dressing as directed. ?Secondary Dressing: Woven Gauze Sponge, Non-Sterile 4x4 in (Generic) Every Other Day/15 Days ?Discharge Instructions: Apply over primary dressing as directed. ?Electronic Signature(s) ?Signed: 05/03/2021 4:32:22 PM By: [name] PA-C ?Signed: 05/04/2021 4:12:14 PM By: [name] ?Entered By: Rhae Hammock on 05/03/2021 12:15:09 ?-------------------------------------------------------------------------------- ?Problem List Details ?Patient Name: ?Date of Service: ?[name], [name] 05/03/2021 12:00 PM ?Medical Record Number: [id number] ?Patient Account Number: [id number] ?Date of Birth/Sex: ?Treating RN: ?[birth year] (88 y.o. [name], [name] ?Primary Care Provider: [name] ?Other Clinician: ?Referring Provider: ?Treating Provider/Extender: [name] ?[name] ?Weeks in Treatment: 1 ?Active Problems ?ICD-10 ?Encounter ?Code Description Active Date MDM ?Diagnosis ?L98.8 Other specified disorders of the skin and subcutaneous tissue 04/26/2021 No Yes ?K48.185 Non-pressure chronic ulcer of other part of right foot with fat layer exposed 04/26/2021 No Yes ?I25.10 Atherosclerotic heart disease of native coronary artery without angina pectoris 04/26/2021 No Yes ?I10 Essential (primary) hypertension 04/26/2021 No Yes ?I87.2 Venous insufficiency (chronic) (peripheral) 04/26/2021 No Yes ?Inactive Problems ?Resolved Problems ?Electronic Signature(s) ?Signed: 05/03/2021 12:13:18 PM By: [name] PA-C ?Entered By: [name] on 05/03/2021 12:13:17 ?-------------------------------------------------------------------------------- ?Progress Note Details ?Patient Name: Date of Service: ?[name], [name] 05/03/2021 12:00 PM ?Medical Record Number: [id number] ?Patient Account Number: [id number] ?Date of Birth/Sex: Treating  RN: ?29-Aug-1925 (88 y.o. [name], [name] ?Primary Care Provider: [name] Other Clinician: ?Referring Provider: ?Treating Provider/Extender: [name] ?[name] ?Weeks in Treatment: 1 ?Subj

## 2021-05-10 ENCOUNTER — Encounter (HOSPITAL_BASED_OUTPATIENT_CLINIC_OR_DEPARTMENT_OTHER): Payer: PPO | Admitting: Physician Assistant

## 2021-05-10 DIAGNOSIS — L988 Other specified disorders of the skin and subcutaneous tissue: Secondary | ICD-10-CM | POA: Diagnosis not present

## 2021-05-10 DIAGNOSIS — I872 Venous insufficiency (chronic) (peripheral): Secondary | ICD-10-CM | POA: Diagnosis not present

## 2021-05-10 DIAGNOSIS — L97512 Non-pressure chronic ulcer of other part of right foot with fat layer exposed: Secondary | ICD-10-CM | POA: Diagnosis not present

## 2021-05-10 DIAGNOSIS — I10 Essential (primary) hypertension: Secondary | ICD-10-CM | POA: Diagnosis not present

## 2021-05-10 NOTE — Progress Notes (Signed)
[name], [name] ([id number]) ?Visit Report for 05/10/2021 ?Arrival Information Details ?Patient Name: Date of Service: ?[name], Tyler YBO RNE B. 05/10/2021 1:00 PM ?Medical Record Number: [id number] ?Patient Account Number: [id number] ?Date of Birth/Sex: Treating RN: ?1925-04-20 (88 y.o. [name], [name] ?Primary Care [name]: [name] Other Clinician: ?Referring [name]: ?Treating [name]/Extender: [name] ?[name] ?Weeks in Treatment: 2 ?Visit Information History Since Last Visit ?Added or deleted any medications: No ?Patient Arrived: Ambulatory ?Any new allergies or adverse reactions: No ?Arrival Time: 13:09 ?Had a fall or experienced change in No ?Accompanied By: wif ?activities of daily living that may affect ?Transfer Assistance: None ?risk of falls: ?Patient Identification Verified: Yes ?Signs or symptoms of abuse/neglect since last visito No ?Secondary Verification Process Completed: Yes ?Hospitalized since last visit: No ?Patient Requires Transmission-Based Precautions: No ?Implantable device outside of the clinic excluding No ?cellular tissue based products placed in the center ?since last visit: ?Has Dressing in Place as Prescribed: Yes ?Pain Present Now: No ?Electronic Signature(s) ?Signed: 05/10/2021 5:07:52 PM By: [name] ?Entered By: Rhae Hammock on 05/10/2021 13:09:33 ?-------------------------------------------------------------------------------- ?Clinic Level of Care Assessment Details ?Patient Name: Date of Service: ?[name], Tyler YBO RNE B. 05/10/2021 1:00 PM ?Medical Record Number: [id number] ?Patient Account Number: [id number] ?Date of Birth/Sex: Treating RN: ?09-21-1925 (88 y.o. [name], [name] ?Primary Care Kirby Cortese: [name] Other Clinician: ?Referring [name]: ?Treating [name]/Extender: [name] ?[name] ?Weeks in Treatment: 2 ?Clinic Level of Care Assessment Items ?TOOL 4 Quantity Score ?X- 1 0 ?Use when only an  EandM is performed on FOLLOW-UP visit ?ASSESSMENTS - Nursing Assessment / Reassessment ?X- 1 10 ?Reassessment of Co-morbidities (includes updates in patient status) ?X- 1 5 ?Reassessment of Adherence to Treatment Plan ?ASSESSMENTS - Wound and Skin A ssessment / Reassessment ?X - Simple Wound Assessment / Reassessment - one wound 1 5 ?'[]'$  - 0 ?Complex Wound Assessment / Reassessment - multiple wounds ?'[]'$  - 0 ?Dermatologic / Skin Assessment (not related to wound area) ?ASSESSMENTS - Focused Assessment ?X- 1 5 ?Circumferential Edema Measurements - multi extremities ?'[]'$  - 0 ?Nutritional Assessment / Counseling / Intervention ?'[]'$  - 0 ?Lower Extremity Assessment (monofilament, tuning fork, pulses) ?'[]'$  - 0 ?Peripheral Arterial Disease Assessment (using hand held doppler) ?ASSESSMENTS - Ostomy and/or Continence Assessment and Care ?'[]'$  - 0 ?Incontinence Assessment and Management ?'[]'$  - 0 ?Ostomy Care Assessment and Management (repouching, etc.) ?PROCESS - Coordination of Care ?X - Simple Patient / Family Education for ongoing care 1 15 ?'[]'$  - 0 ?Complex (extensive) Patient / Family Education for ongoing care ?X- 1 10 ?Staff obtains Consents, Records, T Results / Process Orders ?est ?'[]'$  - 0 ?Staff telephones HHA, Nursing Homes / Clarify orders / etc ?'[]'$  - 0 ?Routine Transfer to another Facility (non-emergent condition) ?'[]'$  - 0 ?Routine Hospital Admission (non-emergent condition) ?'[]'$  - 0 ?New Admissions / Biomedical engineer / Ordering NPWT Apligraf, etc. ?, ?'[]'$  - 0 ?Emergency Hospital Admission (emergent condition) ?X- 1 10 ?Simple Discharge Coordination ?'[]'$  - 0 ?Complex (extensive) Discharge Coordination ?PROCESS - Special Needs ?'[]'$  - 0 ?Pediatric / Minor Patient Management ?'[]'$  - 0 ?Isolation Patient Management ?'[]'$  - 0 ?Hearing / Language / Visual special needs ?'[]'$  - 0 ?Assessment of Community assistance (transportation, D/C planning, etc.) ?'[]'$  - 0 ?Additional assistance / Altered mentation ?'[]'$  - 0 ?Support Surface(s)  Assessment (bed, cushion, seat, etc.) ?INTERVENTIONS - Wound Cleansing / Measurement ?X - Simple Wound Cleansing - one wound 1 5 ?'[]'$  - 0 ?Complex Wound Cleansing - multiple  wounds ?X- 1 5 ?Wound Imaging (photographs - any number of wounds) ?'[]'$  - 0 ?Wound Tracing (instead of photographs) ?X- 1 5 ?Simple Wound Measurement - one wound ?'[]'$  - 0 ?Complex Wound Measurement - multiple wounds ?INTERVENTIONS - Wound Dressings ?'[]'$  - 0 ?Small Wound Dressing one or multiple wounds ?X- 1 15 ?Medium Wound Dressing one or multiple wounds ?'[]'$  - 0 ?Large Wound Dressing one or multiple wounds ?X- 1 5 ?Application of Medications - topical ?'[]'$  - 0 ?Application of Medications - injection ?INTERVENTIONS - Miscellaneous ?'[]'$  - 0 ?External ear exam ?'[]'$  - 0 ?Specimen Collection (cultures, biopsies, blood, body fluids, etc.) ?'[]'$  - 0 ?Specimen(s) / Culture(s) sent or taken to Lab for analysis ?'[]'$  - 0 ?Patient Transfer (multiple staff / Civil Service fast streamer / Similar devices) ?'[]'$  - 0 ?Simple Staple / Suture removal (25 or less) ?'[]'$  - 0 ?Complex Staple / Suture removal (26 or more) ?'[]'$  - 0 ?Hypo / Hyperglycemic Management (close monitor of Blood Glucose) ?'[]'$  - 0 ?Ankle / Brachial Index (ABI) - do not check if billed separately ?X- 1 5 ?Vital Signs ?Has the patient been seen at the hospital within the last three years: Yes ?Total Score: 100 ?Level Of Care: New/Established - Level 3 ?Electronic Signature(s) ?Signed: 05/10/2021 5:07:52 PM By: [name] ?Entered By: Rhae Hammock on 05/10/2021 14:02:11 ?-------------------------------------------------------------------------------- ?Encounter Discharge Information Details ?Patient Name: Date of Service: ?[name], Tyler YBO RNE B. 05/10/2021 1:00 PM ?Medical Record Number: [id number] ?Patient Account Number: [id number] ?Date of Birth/Sex: Treating RN: ?08-12-1925 (88 y.o. [name], [name] ?Primary Care Traver Meckes: [name] Other Clinician: ?Referring Vincie Linn: ?Treating Jerrald Doverspike/Extender:  [name] ?[name] ?Weeks in Treatment: 2 ?Encounter Discharge Information Items ?Discharge Condition: Stable ?Ambulatory Status: Ambulatory ?Discharge Destination: Home ?Transportation: Private Auto ?Accompanied By: Kateri Mc ?Schedule Follow-up Appointment: Yes ?Clinical Summary of Care: Patient Declined ?Electronic Signature(s) ?Signed: 05/10/2021 5:07:52 PM By: [name] ?Entered By: Rhae Hammock on 05/10/2021 13:36:45 ?-------------------------------------------------------------------------------- ?Lower Extremity Assessment Details ?Patient Name: Date of Service: ?[name], Tyler YBO RNE B. 05/10/2021 1:00 PM ?Medical Record Number: [id number] ?Patient Account Number: [id number] ?Date of Birth/Sex: Treating RN: ?1925-05-20 (88 y.o. [name], [name] ?Primary Care Ronne Stefanski: [name] Other Clinician: ?Referring Keifer Habib: ?Treating Luara Faye/Extender: [name] ?[name] ?Weeks in Treatment: 2 ?Edema Assessment ?Assessed: [Left: No] [Right: Yes] ?Edema: [Left: Ye] [Right: s] ?Calf ?Left: Right: ?Point of Measurement: 33 cm From Medial Instep 33.5 cm ?Ankle ?Left: Right: ?Point of Measurement: 8 cm From Medial Instep 22.5 cm ?Vascular Assessment ?Pulses: ?Dorsalis Pedis ?Palpable: [Right:Yes] ?Posterior Tibial ?Palpable: [Right:Yes] ?Electronic Signature(s) ?Signed: 05/10/2021 5:07:52 PM By: [name] ?Entered By: Rhae Hammock on 05/10/2021 13:16:22 ?-------------------------------------------------------------------------------- ?Multi-Disciplinary Care Plan Details ?Patient Name: ?Date of Service: ?[name], Tyler YBO RNE B. 05/10/2021 1:00 PM ?Medical Record Number: [id number] ?Patient Account Number: [id number] ?Date of Birth/Sex: ?Treating RN: ?June 22, 1925 (88 y.o. [name], [name] ?Primary Care Rebecca Cairns: [name] ?Other Clinician: ?Referring Aaronmichael Brumbaugh: ?Treating Anihya Tuma/Extender: [name] ?[name] ?Weeks in Treatment:  2 ?Active Inactive ?Wound/Skin Impairment ?Nursing Diagnoses: ?Impaired tissue integrity ?Knowledge deficit related to ulceration/compromised skin integrity ?Goals: ?Patient will have a decrease in wound volume

## 2021-05-10 NOTE — Progress Notes (Signed)
WAINO, MOUNSEY B. ([id number]) ?Visit Report for 05/10/2021 ?Chief Complaint Document Details ?Patient Name: Date of Service: ?[name], Tyler YBO RNE B. 05/10/2021 1:00 PM ?Medical Record Number: [id number] ?Patient Account Number: [id number] ?Date of Birth/Sex: Treating RN: ?[birth year] (88 y.o. [name], [name] ?Primary Care Provider: [name] Other Clinician: ?Referring Provider: ?Treating Provider/Extender: [name] ?[name] ?Weeks in Treatment: 2 ?Information Obtained from: Patient ?Chief Complaint ?Right dorsal foot ulcer ?Electronic Signature(s) ?Signed: 05/10/2021 1:54:27 PM By: [name] PA-C ?Entered By: [name] on 05/10/2021 13:54:27 ?-------------------------------------------------------------------------------- ?HPI Details ?Patient Name: Date of Service: ?[name], Tyler YBO RNE B. 05/10/2021 1:00 PM ?Medical Record Number: [id number] ?Patient Account Number: [id number] ?Date of Birth/Sex: Treating RN: ?[birth year] (88 y.o. [name], [name] ?Primary Care Provider: [name] Other Clinician: ?Referring Provider: ?Treating Provider/Extender: [name] ?[name] ?Weeks in Treatment: 2 ?History of Present Illness ?HPI Description: 04-26-2021 upon evaluation today patient presents for initial inspection here in the clinic has been having an issue with a dorsal foot wound on ?the right foot for the past year. He tells me at this time that he does not even really know what brought this on. Fortunately he does not seem to have any ?signs of infection here and has been given some acting medication which was benzyl peroxide forward from dermatology that really has not helped. He has not ?had anything really to put on it from a dressing standpoint other than just utilizing some Neosporin over-the-counter for the most part. Nonetheless he figured ?he would come get this checked out since have seen his wife before and we have been able to get her healed he is  hoping we can do the same here. ?Patient does have a history of hypertension, chronic venous insufficiency though he does wear compression socks that he does not appear to be swollen at ?this time. He also has a history of coronary artery disease. ?05-03-2021 upon evaluation today patient appears to be doing well with regard to the wound on the top of his foot this is actually showing signs of excellent ?improvement which is great news and overall I am extremely pleased with where we stand today. There does not appear to be any evidence of active infection ?locally or systemically which is great news. ?05-10-2021 upon evaluation today patient appears to be doing about the same in regard to his wound. Unfortunately I do still think that we are not making ?significant progress here. I believe that the patient probably needs something a little bit better to manage this effectively. I really believe the compression ?sock would not be optimal in this case as it is continuing to pull the dressing off. For that reason I think the best bet is probably going to be for [place] to look into ?compression wrapping. We have avoided this due to the fact that we did have ABIs and the patient is somewhat leery to want to go down that road he tells me ?that at his age, 88, he is really not interested in a lot of evaluation and treatment with regard to his blood flow. I completely understand that but this very well ?may be part of our issue here. Nonetheless he is wanting to opt for a lighter compression wrap to see if that could be beneficial and if we get this healed ?without a lot of other testing and so long. ?Electronic Signature(s) ?Signed: 05/10/2021 2:07:59 PM By: [name] PA-C ?Entered By: [name] on 05/10/2021 14:07:59 ?-------------------------------------------------------------------------------- ?  Physical Exam Details ?Patient Name: Date of Service: ?[name], Tyler YBO RNE B. 05/10/2021 1:00 PM ?Medical Record Number:  [id number] ?Patient Account Number: [id number] ?Date of Birth/Sex: Treating RN: ?12/01/1925 (88 y.o. [name], [name] ?Primary Care Provider: [name] Other Clinician: ?Referring Provider: ?Treating Provider/Extender: [name] ?[name] ?Weeks in Treatment: 2 ?Constitutional ?Well-nourished and well-hydrated in no acute distress. ?Respiratory ?normal breathing without difficulty. ?Psychiatric ?this patient is able to make decisions and demonstrates good insight into disease process. Alert and Oriented x 3. pleasant and cooperative. ?Notes ?Upon inspection patient's wound bed again showed signs of been pretty clean but not making a lot of progress here. Again I do believe that blood flow could be ?an issue. It also could be that this is a cancer type wound although there are not specific signs that make me really worried about that it is also not out of the ?question. Lastly it is also possible that what we are dealing with here is simply that he needs more control of the edema and not something that we can try to ?work on here as well. ?Electronic Signature(s) ?Signed: 05/10/2021 2:08:59 PM By: [name] PA-C ?Entered By: [name] on 05/10/2021 14:08:59 ?-------------------------------------------------------------------------------- ?Physician Orders Details ?Patient Name: Date of Service: ?[name], Tyler YBO RNE B. 05/10/2021 1:00 PM ?Medical Record Number: [id number] ?Patient Account Number: [id number] ?Date of Birth/Sex: Treating RN: ?05/22/1925 (88 y.o. [name], [name] ?Primary Care Provider: [name] Other Clinician: ?Referring Provider: ?Treating Provider/Extender: [name] ?[name] ?Weeks in Treatment: 2 ?Verbal / Phone Orders: No ?Diagnosis Coding ?Follow-up Appointments ?ppointment in 1 week. Margarita Grizzle [address] and [place] # 9 ?one ?Return A ?Bathing/ Shower/ Hygiene ?May shower with protection but do not get wound dressing(s) wet. - You may  shower using a cast protector over top of your wrap!!! ?Edema Control - Lymphedema / SCD / Other ?Elevate legs to the level of the heart or above for 30 minutes daily and/or when sitting, a frequency of: ?Avoid standing for long periods of time. ?Patient to wear own compression stockings every day. ?Wound Treatment ?Wound #1 - Foot Wound Laterality: Dorsal, Right ?Cleanser: Soap and Water Every Other Day/15 Days ?Discharge Instructions: May shower and wash wound with dial antibacterial soap and water prior to dressing change. ?Cleanser: Wound Cleanser (Generic) Every Other Day/15 Days ?Discharge Instructions: Cleanse the wound with wound cleanser prior to applying a clean dressing using gauze sponges, not tissue or cotton balls. ?Prim Dressing: Promogran Prisma Matrix, 4.34 (sq in) (silver collagen) (Generic) Every Other Day/15 Days ?ary ?Discharge Instructions: Moisten collagen with saline or hydrogel ?Secondary Dressing: Woven Gauze Sponge, Non-Sterile 4x4 in Every Other Day/15 Days ?Discharge Instructions: Apply over primary dressing as directed. ?Compression Wrap: Kerlix Roll 4.5x3.1 (in/yd) Every Other Day/15 Days ?Discharge Instructions: Apply Kerlix and Coban compression as directed. ?Compression Wrap: Coban Self-Adherent Wrap 4x5 (in/yd) Every Other Day/15 Days ?Discharge Instructions: Apply over Kerlix as directed. ?Electronic Signature(s) ?Signed: 05/10/2021 5:07:52 PM By: [name] ?Signed: 05/10/2021 6:03:33 PM By: [name] PA-C ?Entered By: Rhae Hammock on 05/10/2021 14:01:16 ?-------------------------------------------------------------------------------- ?Problem List Details ?Patient Name: ?Date of Service: ?[name], Tyler YBO RNE B. 05/10/2021 1:00 PM ?Medical Record Number: [id number] ?Patient Account Number: [id number] ?Date of Birth/Sex: ?Treating RN: ?Feb 07, 1925 (88 y.o. [name], [name] ?Primary Care Provider: [name] ?Other Clinician: ?Referring Provider: ?Treating  Provider/Extender: [name] ?[name] ?Weeks in Treatment: 2 ?Active Problems ?ICD-10 ?Encounter ?Code Description Active  Date MDM ?Diagnosis ?L98.8 Other specified disorders of the skin

## 2021-05-16 DIAGNOSIS — D500 Iron deficiency anemia secondary to blood loss (chronic): Secondary | ICD-10-CM | POA: Diagnosis not present

## 2021-05-17 ENCOUNTER — Encounter (HOSPITAL_BASED_OUTPATIENT_CLINIC_OR_DEPARTMENT_OTHER): Payer: PPO | Admitting: Physician Assistant

## 2021-05-17 DIAGNOSIS — L97512 Non-pressure chronic ulcer of other part of right foot with fat layer exposed: Secondary | ICD-10-CM | POA: Diagnosis not present

## 2021-05-17 DIAGNOSIS — L988 Other specified disorders of the skin and subcutaneous tissue: Secondary | ICD-10-CM | POA: Diagnosis not present

## 2021-05-17 NOTE — Progress Notes (Addendum)
LATASHA, PUSKAS ([id number]) Visit Report for 05/17/2021 Chief Complaint Document Details Patient Name: Date of Service: [name], [name] 05/17/2021 2:15 PM Medical Record Number: [id number] Patient Account Number: [id number] Date of Birth/Sex: Treating RN: 15-Feb-1925 (88 y.o. [name], [name] Primary Care Provider: [name] Other Clinician: Referring Provider: Treating Provider/Extender: [name], [name] in Treatment: 3 Information Obtained from: Patient Chief Complaint Right dorsal foot ulcer Electronic Signature(s) Signed: 05/17/2021 2:05:33 PM By: [name] PA-C Entered By: [name] on 05/17/2021 14:05:33 -------------------------------------------------------------------------------- HPI Details Patient Name: Date of Service: [name], [name] 05/17/2021 2:15 PM Medical Record Number: [id number] Patient Account Number: [id number] Date of Birth/Sex: Treating RN: 1925-08-13 (88 y.o. [name], [name] Primary Care Provider: [name] Other Clinician: Referring Provider: Treating Provider/Extender: [name] in Treatment: 3 History of Present Illness HPI Description: 04-26-2021 upon evaluation today patient presents for initial inspection here in the clinic has been having an issue with a dorsal foot wound on the right foot for the past year. He tells me at this time that he does not even really know what brought this on. Fortunately he does not seem to have any signs of infection here and has been given some acting medication which was benzyl peroxide forward from dermatology that really has not helped. He has not had anything really to put on it from a dressing standpoint other than just utilizing some Neosporin over-the-counter for the most part. Nonetheless he figured he would come get this checked out since have seen his wife before and we have been able to get her healed he is  hoping we can do the same here. Patient does have a history of hypertension, chronic venous insufficiency though he does wear compression socks that he does not appear to be swollen at this time. He also has a history of coronary artery disease. 05-03-2021 upon evaluation today patient appears to be doing well with regard to the wound on the top of his foot this is actually showing signs of excellent improvement which is great news and overall I am extremely pleased with where we stand today. There does not appear to be any evidence of active infection locally or systemically which is great news. 05-10-2021 upon evaluation today patient appears to be doing about the same in regard to his wound. Unfortunately I do still think that we are not making significant progress here. I believe that the patient probably needs something a little bit better to manage this effectively. I really believe the compression sock would not be optimal in this case as it is continuing to pull the dressing off. For that reason I think the best bet is probably going to be for [place] to look into compression wrapping. We have avoided this due to the fact that we did have ABIs and the patient is somewhat leery to want to go down that road he tells me that at his age, 88, he is really not interested in a lot of evaluation and treatment with regard to his blood flow. I completely understand that but this very well may be part of our issue here. Nonetheless he is wanting to opt for a lighter compression wrap to see if that could be beneficial and if we get this healed without a lot of other testing and so long. 05-17-2021 upon evaluation today patient appears to be doing well with regard to his wound although were not significantly  smaller here. Fortunately I do not see any evidence of active infection locally or systemically which is great news and overall I am pleased in that regard. No fevers, chills, nausea, vomiting, or diarrhea.  Again we have discussed the possibility of a biopsy if things were not getting better. The wound is slightly better the leg is significantly smaller. With that being said he had a lot of drainage over the past week which again does affect things to some degree as well for that reason I am probably hold off on a biopsy for 1 more week if we do a biopsy and will need to be a fairly superficial punch as I do not want to do anything too deep in case blood flow is an issue here. He has really declined going for any significant arterial studies and therefore I cannot be certain that his blood flow is sufficient to sustain a aggressive intervention in that regard. Nonetheless we may need to know for sure whether this is any significant evidence of a cancerous lesion or otherwise. Electronic Signature(s) Signed: 05/17/2021 2:32:42 PM By: [name] PA-C Entered By: [name] on 05/17/2021 14:32:42 -------------------------------------------------------------------------------- Physical Exam Details Patient Name: Date of Service: [name], [name] 05/17/2021 2:15 PM Medical Record Number: [id number] Patient Account Number: [id number] Date of Birth/Sex: Treating RN: 1925/09/09 (88 y.o. Erie Noe Primary Care Provider: [name] Other Clinician: Referring Provider: Treating Provider/Extender: [name], [name] in Treatment: 3 Constitutional Well-nourished and well-hydrated in no acute distress. Respiratory normal breathing without difficulty. Psychiatric this patient is able to make decisions and demonstrates good insight into disease process. Alert and Oriented x 3. pleasant and cooperative. Notes Upon inspection patient's wound bed actually showed signs of doing well as far as the overall appearance is concerned I do not see any evidence of infection which is great news and overall very pleased in that regard. Electronic Signature(s) Signed:  05/17/2021 2:32:55 PM By: [name] PA-C Entered By: [name] on 05/17/2021 14:32:55 -------------------------------------------------------------------------------- Physician Orders Details Patient Name: Date of Service: [name], [name] 05/17/2021 2:15 PM Medical Record Number: [id number] Patient Account Number: [id number] Date of Birth/Sex: Treating RN: [birth year] (88 y.o. [name], [name] Primary Care Provider: [name] Other Clinician: Referring Provider: Treating Provider/Extender: [name] in Treatment: 3 Verbal / Phone Orders: No Diagnosis Coding ICD-10 Coding Code Description L98.8 Other specified disorders of the skin and subcutaneous tissue L97.512 Non-pressure chronic ulcer of other part of right foot with fat layer exposed I25.10 Atherosclerotic heart disease of native coronary artery without angina pectoris I10 Essential (primary) hypertension I87.2 Venous insufficiency (chronic) (peripheral) Follow-up Appointments ppointment in 1 week. Margarita Grizzle ST and [place] # 9 one Return A Bathing/ Shower/ Hygiene May shower with protection but do not get wound dressing(s) wet. - You may shower using a cast protector over top of your wrap!!! Edema Control - Lymphedema / SCD / Other Elevate legs to the level of the heart or above for 30 minutes daily and/or when sitting, a frequency of: Avoid standing for long periods of time. Patient to wear own compression stockings every day. Wound Treatment Wound #1 - Foot Wound Laterality: Dorsal, Right Cleanser: Soap and Water Every Other Day/15 Days Discharge Instructions: May shower and wash wound with dial antibacterial soap and water prior to dressing change. Cleanser: Wound Cleanser (Generic) Every Other Day/15 Days Discharge Instructions: Cleanse the wound with  wound cleanser prior to applying a clean dressing using gauze sponges, not tissue or cotton balls. Prim  Dressing: Promogran Prisma Matrix, 4.34 (sq in) (silver collagen) (Generic) Every Other Day/15 Days ary Discharge Instructions: Moisten collagen with saline or hydrogel Secondary Dressing: Woven Gauze Sponge, Non-Sterile 4x4 in Every Other Day/15 Days Discharge Instructions: Apply over primary dressing as directed. Compression Wrap: Kerlix Roll 4.5x3.1 (in/yd) Every Other Day/15 Days Discharge Instructions: Apply Kerlix and Coban compression as directed. Compression Wrap: Coban Self-Adherent Wrap 4x5 (in/yd) Every Other Day/15 Days Discharge Instructions: Apply over Kerlix as directed. Electronic Signature(s) Signed: 05/17/2021 4:18:49 PM By: [name] PA-C Signed: 05/18/2021 4:40:18 PM By: [name] Entered By: Rhae Hammock on 05/17/2021 14:16:34 -------------------------------------------------------------------------------- Problem List Details Patient Name: Date of Service: [name], [name] 05/17/2021 2:15 PM Medical Record Number: [id number] Patient Account Number: [id number] Date of Birth/Sex: Treating RN: 05-27-1925 (88 y.o. [name], [name] Primary Care Provider: [name] Other Clinician: Referring Provider: Treating Provider/Extender: [name] in Treatment: 3 Active Problems ICD-10 Encounter Code Description Active Date MDM Diagnosis L98.8 Other specified disorders of the skin and subcutaneous tissue 04/26/2021 No Yes L97.512 Non-pressure chronic ulcer of other part of right foot with fat layer exposed 04/26/2021 No Yes I25.10 Atherosclerotic heart disease of native coronary artery without angina pectoris 04/26/2021 No Yes I10 Essential (primary) hypertension 04/26/2021 No Yes I87.2 Venous insufficiency (chronic) (peripheral) 04/26/2021 No Yes Inactive Problems Resolved Problems Electronic Signature(s) Signed: 05/17/2021 2:05:27 PM By: [name] PA-C Entered By: [name] on 05/17/2021  14:05:27 -------------------------------------------------------------------------------- Progress Note Details Patient Name: Date of Service: [name], [name] 05/17/2021 2:15 PM Medical Record Number: [id number] Patient Account Number: [id number] Date of Birth/Sex: Treating RN: 07-21-1925 (88 y.o. [name], [name] Primary Care Provider: [name] Other Clinician: Referring Provider: Treating Provider/Extender: [name] in Treatment: 3 Subjective Chief Complaint Information obtained from Patient Right dorsal foot ulcer History of Present Illness (HPI) 04-26-2021 upon evaluation today patient presents for initial inspection here in the clinic has been having an issue with a dorsal foot wound on the right foot for the past year. He tells me at this time that he does not even really know what brought this on. Fortunately he does not seem to have any signs of infection here and has been given some acting medication which was benzyl peroxide forward from dermatology that really has not helped. He has not had anything really to put on it from a dressing standpoint other than just utilizing some Neosporin over-the-counter for the most part. Nonetheless he figured he would come get this checked out since have seen his wife before and we have been able to get her healed he is hoping we can do the same here. Patient does have a history of hypertension, chronic venous insufficiency though he does wear compression socks that he does not appear to be swollen at this time. He also has a history of coronary artery disease. 05-03-2021 upon evaluation today patient appears to be doing well with regard to the wound on the top of his foot this is actually showing signs of excellent improvement which is great news and overall I am extremely pleased with where we stand today. There does not appear to be any evidence of active infection locally or systemically which  is great news. 05-10-2021 upon evaluation today patient appears to be doing about the same in regard to his  wound. Unfortunately I do still think that we are not making significant progress here. I believe that the patient probably needs something a little bit better to manage this effectively. I really believe the compression sock would not be optimal in this case as it is continuing to pull the dressing off. For that reason I think the best bet is probably going to be for [place] to look into compression wrapping. We have avoided this due to the fact that we did have ABIs and the patient is somewhat leery to want to go down that road he tells me that at his age, 27, he is really not interested in a lot of evaluation and treatment with regard to his blood flow. I completely understand that but this very well may be part of our issue here. Nonetheless he is wanting to opt for a lighter compression wrap to see if that could be beneficial and if we get this healed without a lot of other testing and so long. 05-17-2021 upon evaluation today patient appears to be doing well with regard to his wound although were not significantly smaller here. Fortunately I do not see any evidence of active infection locally or systemically which is great news and overall I am pleased in that regard. No fevers, chills, nausea, vomiting, or diarrhea. Again we have discussed the possibility of a biopsy if things were not getting better. The wound is slightly better the leg is significantly smaller. With that being said he had a lot of drainage over the past week which again does affect things to some degree as well for that reason I am probably hold off on a biopsy for 1 more week if we do a biopsy and will need to be a fairly superficial punch as I do not want to do anything too deep in case blood flow is an issue here. He has really declined going for any significant arterial studies and therefore I cannot be certain that his  blood flow is sufficient to sustain a aggressive intervention in that regard. Nonetheless we may need to know for sure whether this is any significant evidence of a cancerous lesion or otherwise. Objective Constitutional Well-nourished and well-hydrated in no acute distress. Vitals Time Taken: 2:08 PM, Height: 72 in, Weight: 170 lbs, BMI: 23.1, Temperature: 97.9 F, Pulse: 74 bpm, Respiratory Rate: 17 breaths/min, Blood Pressure: 174/57 mmHg. Respiratory normal breathing without difficulty. Psychiatric this patient is able to make decisions and demonstrates good insight into disease process. Alert and Oriented x 3. pleasant and cooperative. General Notes: Upon inspection patient's wound bed actually showed signs of doing well as far as the overall appearance is concerned I do not see any evidence of infection which is great news and overall very pleased in that regard. Integumentary (Hair, Skin) Wound #1 status is Open. Original cause of wound was Gradually Appeared. The date acquired was: 04/26/2020. The wound has been in treatment 3 weeks. The wound is located on the Right,Dorsal Foot. The wound measures 2cm length x 2.6cm width x 0.2cm depth; 4.084cm^2 area and 0.817cm^3 volume. There is Fat Layer (Subcutaneous Tissue) exposed. There is no tunneling or undermining noted. There is a medium amount of serosanguineous drainage noted. There is medium (34-66%) red, pink granulation within the wound bed. There is a medium (34-66%) amount of necrotic tissue within the wound bed including Adherent Slough. Assessment Active Problems ICD-10 Other specified disorders of the skin and subcutaneous tissue Non-pressure chronic ulcer of other part of right foot  with fat layer exposed Atherosclerotic heart disease of native coronary artery without angina pectoris Essential (primary) hypertension Venous insufficiency (chronic) (peripheral) Plan Follow-up Appointments: Return Appointment in 1 week. Margarita Grizzle ST and [place] # 9 one Bathing/ Shower/ Hygiene: May shower with protection but do not get wound dressing(s) wet. - You may shower using a cast protector over top of your wrap!!! Edema Control - Lymphedema / SCD / Other: Elevate legs to the level of the heart or above for 30 minutes daily and/or when sitting, a frequency of: Avoid standing for long periods of time. Patient to wear own compression stockings every day. WOUND #1: - Foot Wound Laterality: Dorsal, Right Cleanser: Soap and Water Every Other Day/15 Days Discharge Instructions: May shower and wash wound with dial antibacterial soap and water prior to dressing change. Cleanser: Wound Cleanser (Generic) Every Other Day/15 Days Discharge Instructions: Cleanse the wound with wound cleanser prior to applying a clean dressing using gauze sponges, not tissue or cotton balls. Prim Dressing: Promogran Prisma Matrix, 4.34 (sq in) (silver collagen) (Generic) Every Other Day/15 Days ary Discharge Instructions: Moisten collagen with saline or hydrogel Secondary Dressing: Woven Gauze Sponge, Non-Sterile 4x4 in Every Other Day/15 Days Discharge Instructions: Apply over primary dressing as directed. Com pression Wrap: Kerlix Roll 4.5x3.1 (in/yd) Every Other Day/15 Days Discharge Instructions: Apply Kerlix and Coban compression as directed. Com pression Wrap: Coban Self-Adherent Wrap 4x5 (in/yd) Every Other Day/15 Days Discharge Instructions: Apply over Kerlix as directed. 1. I am going to suggest that we go ahead and continue with the wound care measures as before and the patient is in agreement with plan. This includes the use of the silver collagen dressing which I think is probably still the best way to go. 2. I am also can recommend that we have the patient continue with the Curlex and Coban wrap which I do believe is doing well working to put some gauze over top of the collagen to try to keep this from staying too moist. We will see  patient back for reevaluation in 1 week here in the clinic. If anything worsens or changes patient will contact our office for additional recommendations. Electronic Signature(s) Signed: 05/17/2021 2:33:29 PM By: [name] PA-C Entered By: [name] on 05/17/2021 14:33:29 -------------------------------------------------------------------------------- SuperBill Details Patient Name: Date of Service: [name], [name] 05/17/2021 Medical Record Number: [id number] Patient Account Number: [id number] Date of Birth/Sex: Treating RN: Feb 03, 1925 (88 y.o. [name], [name] Primary Care Provider: [name] Other Clinician: Referring Provider: Treating Provider/Extender: [name], [name] in Treatment: 3 Diagnosis Coding ICD-10 Codes Code Description L98.8 Other specified disorders of the skin and subcutaneous tissue L97.512 Non-pressure chronic ulcer of other part of right foot with fat layer exposed I25.10 Atherosclerotic heart disease of native coronary artery without angina pectoris I10 Essential (primary) hypertension I87.2 Venous insufficiency (chronic) (peripheral) Facility Procedures CPT4 Code: [id number] Description: 99213 - WOUND CARE VISIT-LEV 3 EST PT Modifier: Quantity: 1 Physician Procedures : CPT4 Code Description Modifier [id number] - WC PHYS LEVEL 3 - EST PT ICD-10 Diagnosis Description L98.8 Other specified disorders of the skin and subcutaneous tissue L97.512 Non-pressure chronic ulcer of other part of right foot with fat layer  exposed I25.10 Atherosclerotic heart disease of native coronary artery without angina pectoris I10 Essential (primary) hypertension Quantity: 1 Electronic Signature(s) Signed: 05/17/2021 2:38:07 PM By: [name] PA-C Entered By: [name] on 05/17/2021 14:38:07

## 2021-05-18 NOTE — Progress Notes (Signed)
Tyler, [name] ([id number]) Visit Report for 05/17/2021 Arrival Information Details Patient Name: Date of Service: HA [name] RNE B. 05/17/2021 2:15 PM Medical Record Number: [id number] Patient Account Number: [id number] Date of Birth/Sex: Treating RN: 1925/06/24 (88 y.o. [name], [name] Primary Care George Haggart: [name] Other Clinician: Referring Commodore Bellew: Treating Alfonzo Arca/Extender: [name] in Treatment: 3 Visit Information History Since Last Visit Added or deleted any medications: No Patient Arrived: Ambulatory Any new allergies or adverse reactions: No Arrival Time: 14:07 Had a fall or experienced change in No Accompanied By: wife activities of daily living that may affect Transfer Assistance: None risk of falls: Patient Identification Verified: Yes Signs or symptoms of abuse/neglect since last visito No Secondary Verification Process Completed: Yes Hospitalized since last visit: No Patient Requires Transmission-Based Precautions: No Implantable device outside of the clinic excluding No cellular tissue based products placed in the center since last visit: Has Dressing in Place as Prescribed: Yes Has Compression in Place as Prescribed: Yes Pain Present Now: No Electronic Signature(s) Signed: 05/18/2021 4:40:18 PM By: [name] Entered By: Rhae Hammock on 05/17/2021 14:07:46 -------------------------------------------------------------------------------- Clinic Level of Care Assessment Details Patient Name: Date of Service: [name], [name] 05/17/2021 2:15 PM Medical Record Number: [id number] Patient Account Number: [id number] Date of Birth/Sex: Treating RN: 12/09/1925 (88 y.o. [name], [name] Primary Care Hamzah Savoca: [name] Other Clinician: Referring Caliber Landess: Treating [name]/Extender: [name] in Treatment: 3 Clinic Level of Care Assessment  Items TOOL 4 Quantity Score X- 1 0 Use when only an EandM is performed on FOLLOW-UP visit ASSESSMENTS - Nursing Assessment / Reassessment X- 1 10 Reassessment of Co-morbidities (includes updates in patient status) X- 1 5 Reassessment of Adherence to Treatment Plan ASSESSMENTS - Wound and Skin A ssessment / Reassessment X - Simple Wound Assessment / Reassessment - one wound 1 5 '[]'$  - 0 Complex Wound Assessment / Reassessment - multiple wounds '[]'$  - 0 Dermatologic / Skin Assessment (not related to wound area) ASSESSMENTS - Focused Assessment X- 1 5 Circumferential Edema Measurements - multi extremities '[]'$  - 0 Nutritional Assessment / Counseling / Intervention '[]'$  - 0 Lower Extremity Assessment (monofilament, tuning fork, pulses) '[]'$  - 0 Peripheral Arterial Disease Assessment (using hand held doppler) ASSESSMENTS - Ostomy and/or Continence Assessment and Care '[]'$  - 0 Incontinence Assessment and Management '[]'$  - 0 Ostomy Care Assessment and Management (repouching, etc.) PROCESS - Coordination of Care X - Simple Patient / Family Education for ongoing care 1 15 '[]'$  - 0 Complex (extensive) Patient / Family Education for ongoing care X- 1 10 Staff obtains Programmer, systems, Records, T Results / Process Orders est X- 1 10 Staff telephones HHA, Nursing Homes / Clarify orders / etc '[]'$  - 0 Routine Transfer to another Facility (non-emergent condition) '[]'$  - 0 Routine Hospital Admission (non-emergent condition) '[]'$  - 0 New Admissions / Biomedical engineer / Ordering NPWT Apligraf, etc. , '[]'$  - 0 Emergency Hospital Admission (emergent condition) X- 1 10 Simple Discharge Coordination '[]'$  - 0 Complex (extensive) Discharge Coordination PROCESS - Special Needs '[]'$  - 0 Pediatric / Minor Patient Management '[]'$  - 0 Isolation Patient Management '[]'$  - 0 Hearing / Language / Visual special needs '[]'$  - 0 Assessment of Community assistance (transportation, D/C planning, etc.) '[]'$  - 0 Additional  assistance / Altered mentation '[]'$  - 0 Support Surface(s) Assessment (bed, cushion, seat, etc.) INTERVENTIONS - Wound Cleansing / Measurement X - Simple Wound Cleansing - one wound 1 5 '[]'$  -  0 Complex Wound Cleansing - multiple wounds X- 1 5 Wound Imaging (photographs - any number of wounds) '[]'$  - 0 Wound Tracing (instead of photographs) X- 1 5 Simple Wound Measurement - one wound '[]'$  - 0 Complex Wound Measurement - multiple wounds INTERVENTIONS - Wound Dressings '[]'$  - 0 Small Wound Dressing one or multiple wounds X- 1 15 Medium Wound Dressing one or multiple wounds '[]'$  - 0 Large Wound Dressing one or multiple wounds X- 1 5 Application of Medications - topical '[]'$  - 0 Application of Medications - injection INTERVENTIONS - Miscellaneous '[]'$  - 0 External ear exam '[]'$  - 0 Specimen Collection (cultures, biopsies, blood, body fluids, etc.) '[]'$  - 0 Specimen(s) / Culture(s) sent or taken to Lab for analysis '[]'$  - 0 Patient Transfer (multiple staff / Civil Service fast streamer / Similar devices) '[]'$  - 0 Simple Staple / Suture removal (25 or less) '[]'$  - 0 Complex Staple / Suture removal (26 or more) '[]'$  - 0 Hypo / Hyperglycemic Management (close monitor of Blood Glucose) '[]'$  - 0 Ankle / Brachial Index (ABI) - do not check if billed separately X- 1 5 Vital Signs Has the patient been seen at the hospital within the last three years: Yes Total Score: 110 Level Of Care: New/Established - Level 3 Electronic Signature(s) Signed: 05/18/2021 4:40:18 PM By: [name] Entered By: Rhae Hammock on 05/17/2021 14:17:41 -------------------------------------------------------------------------------- Encounter Discharge Information Details Patient Name: Date of Service: [name], [name] 05/17/2021 2:15 PM Medical Record Number: [id number] Patient Account Number: [id number] Date of Birth/Sex: Treating RN: 1925/01/07 (88 y.o. [name], [name] Primary Care Jerzee Jerome: [name] Other  Clinician: Referring Roman Sandall: Treating [name]/Extender: [name] in Treatment: 3 Encounter Discharge Information Items Discharge Condition: Stable Ambulatory Status: Ambulatory Discharge Destination: Home Transportation: Private Auto Accompanied By: wife Schedule Follow-up Appointment: Yes Clinical Summary of Care: Patient Declined Electronic Signature(s) Signed: 05/18/2021 4:40:18 PM By: [name] Entered By: Rhae Hammock on 05/17/2021 15:00:58 -------------------------------------------------------------------------------- Lower Extremity Assessment Details Patient Name: Date of Service: [name], [name] 05/17/2021 2:15 PM Medical Record Number: [id number] Patient Account Number: [id number] Date of Birth/Sex: Treating RN: November 28, 1925 (88 y.o. [name], [name] Primary Care Kaleab Frasier: [name] Other Clinician: Referring Pryce Folts: Treating Destin Kittler/Extender: [name], [name] in Treatment: 3 Edema Assessment Assessed: [Left: No] Patrice Paradise: Yes] Edema: [Left: Ye] [Right: s] Calf Left: Right: Point of Measurement: 33 cm From Medial Instep 30.4 cm Ankle Left: Right: Point of Measurement: 8 cm From Medial Instep 20 cm Vascular Assessment Pulses: Dorsalis Pedis Palpable: [Right:Yes] Posterior Tibial Palpable: [Right:Yes] Electronic Signature(s) Signed: 05/18/2021 4:40:18 PM By: [name] Entered By: Rhae Hammock on 05/17/2021 14:09:56 -------------------------------------------------------------------------------- Multi-Disciplinary Care Plan Details Patient Name: Date of Service: [name], [name] 05/17/2021 2:15 PM Medical Record Number: [id number] Patient Account Number: [id number] Date of Birth/Sex: Treating RN: [birth year] (88 y.o. [name], [name] Primary Care Kerria Sapien: [name] Other Clinician: Referring Jacci Ruberg: Treating Mccabe Gloria/Extender:  [name], [name] in Treatment: 3 Active Inactive Wound/Skin Impairment Nursing Diagnoses: Impaired tissue integrity Knowledge deficit related to ulceration/compromised skin integrity Goals: Patient will have a decrease in wound volume by X% from date: (specify in notes) Date Initiated: 04/26/2021 Target Resolution Date: 06/03/2021 Goal Status: Active Patient/caregiver will verbalize understanding of skin care regimen Date Initiated: 04/26/2021 Target Resolution Date: 06/02/2021 Goal Status: Active Ulcer/skin breakdown will have a volume reduction of 30% by week 4 Date Initiated: 04/26/2021 Target Resolution  Date: 06/01/2021 Goal Status: Active Interventions: Assess patient/caregiver ability to obtain necessary supplies Assess patient/caregiver ability to perform ulcer/skin care regimen upon admission and as needed Assess ulceration(s) every visit Notes: Electronic Signature(s) Signed: 05/18/2021 4:40:18 PM By: [name] Entered By: Rhae Hammock on 05/17/2021 14:15:33 -------------------------------------------------------------------------------- Pain Assessment Details Patient Name: Date of Service: [name], [name] 05/17/2021 2:15 PM Medical Record Number: [id number] Patient Account Number: [id number] Date of Birth/Sex: Treating RN: May 24, 1925 (88 y.o. [name], [name] Primary Care Beckham Buxbaum: [name] Other Clinician: Referring Saahil Herbster: Treating Karmine Kauer/Extender: [name], [name] in Treatment: 3 Active Problems Location of Pain Severity and Description of Pain Patient Has Paino No Site Locations Pain Management and Medication Current Pain Management: Electronic Signature(s) Signed: 05/18/2021 4:40:18 PM By: [name] Entered By: Rhae Hammock on 05/17/2021 14:09:02 -------------------------------------------------------------------------------- Patient/Caregiver Education  Details Patient Name: Date of Service: [name], [name] 5/17/2023andnbsp2:15 PM Medical Record Number: [id number] Patient Account Number: [id number] Date of Birth/Gender: Treating RN: 1925-10-12 (88 y.o. Erie Noe Primary Care Physician: [name] Other Clinician: Referring Physician: Treating Physician/Extender: [name] in Treatment: 3 Education Assessment Education Provided To: Patient Education Topics Provided Wound/Skin Impairment: Methods: Explain/Verbal Responses: Reinforcements needed, State content correctly Electronic Signature(s) Signed: 05/18/2021 4:40:18 PM By: [name] Entered By: Rhae Hammock on 05/17/2021 14:15:48 -------------------------------------------------------------------------------- Wound Assessment Details Patient Name: Date of Service: [name], [name] 05/17/2021 2:15 PM Medical Record Number: [id number] Patient Account Number: [id number] Date of Birth/Sex: Treating RN: 10/24/1925 (88 y.o. [name], [name] Primary Care Yenesis Even: [name] Other Clinician: Referring Sanjana Folz: Treating Jinny Sweetland/Extender: [name], [name] in Treatment: 3 Wound Status Wound Number: 1 Primary Etiology: Atypical Wound Location: Right, Dorsal Foot Wound Status: Open Wounding Event: Gradually Appeared Comorbid History: Coronary Artery Disease, Hypertension, Osteoarthritis Date Acquired: 04/26/2020 Weeks Of Treatment: 3 Clustered Wound: No Photos Wound Measurements Length: (cm) 2 Width: (cm) 2.6 Depth: (cm) 0.2 Area: (cm) 4.084 Volume: (cm) 0.817 % Reduction in Area: 5.5% % Reduction in Volume: 5.4% Epithelialization: Small (1-33%) Tunneling: No Undermining: No Wound Description Classification: Full Thickness With Exposed Support Structures Exudate Amount: Medium Exudate Type: Serosanguineous Exudate Color: red, brown Foul Odor After  Cleansing: No Slough/Fibrino Yes Wound Bed Granulation Amount: Medium (34-66%) Exposed Structure Granulation Quality: Red, Pink Fascia Exposed: No Necrotic Amount: Medium (34-66%) Fat Layer (Subcutaneous Tissue) Exposed: Yes Necrotic Quality: Adherent Slough Tendon Exposed: No Muscle Exposed: No Joint Exposed: No Bone Exposed: No Treatment Notes Wound #1 (Foot) Wound Laterality: Dorsal, Right Cleanser Soap and Water Discharge Instruction: May shower and wash wound with dial antibacterial soap and water prior to dressing change. Wound Cleanser Discharge Instruction: Cleanse the wound with wound cleanser prior to applying a clean dressing using gauze sponges, not tissue or cotton balls. Peri-Wound Care Topical Primary Dressing Promogran Prisma Matrix, 4.34 (sq in) (silver collagen) Discharge Instruction: Moisten collagen with saline or hydrogel Secondary Dressing Woven Gauze Sponge, Non-Sterile 4x4 in Discharge Instruction: Apply over primary dressing as directed. Secured With Compression Wrap Kerlix Roll 4.5x3.1 (in/yd) Discharge Instruction: Apply Kerlix and Coban compression as directed. Coban Self-Adherent Wrap 4x5 (in/yd) Discharge Instruction: Apply over Kerlix as directed. Compression Stockings Add-Ons Electronic Signature(s) Signed: 05/17/2021 4:30:40 PM By: Lorrin Jackson Signed: 05/18/2021 4:40:18 PM By: [name] Entered By: Lorrin Jackson on 05/17/2021 14:12:10 -------------------------------------------------------------------------------- Vitals Details Patient Name: Date of Service: [name], [name] 05/17/2021 2:15 PM Medical  Record Number: [id number] Patient Account Number: [id number] Date of Birth/Sex: Treating RN: 20-May-1925 (88 y.o. [name], [name] Primary Care Ebony Rickel: [name] Other Clinician: Referring Kavian Peters: Treating Denasia Venn/Extender: [name], [name] in Treatment: 3 Vital Signs Time  Taken: 14:08 Temperature (F): 97.9 Height (in): 72 Pulse (bpm): 74 Weight (lbs): 170 Respiratory Rate (breaths/min): 17 Body Mass Index (BMI): 23.1 Blood Pressure (mmHg): 174/57 Reference Range: 80 - 120 mg / dl Electronic Signature(s) Signed: 05/18/2021 4:40:18 PM By: [name] Entered By: Rhae Hammock on 05/17/2021 14:08:54

## 2021-05-24 ENCOUNTER — Encounter (HOSPITAL_BASED_OUTPATIENT_CLINIC_OR_DEPARTMENT_OTHER): Payer: PPO | Admitting: Physician Assistant

## 2021-05-24 ENCOUNTER — Other Ambulatory Visit: Payer: Self-pay | Admitting: Cardiovascular Disease

## 2021-05-24 ENCOUNTER — Other Ambulatory Visit (HOSPITAL_COMMUNITY)
Admission: RE | Admit: 2021-05-24 | Discharge: 2021-05-24 | Disposition: A | Discharge status: Home / Self Care | Payer: PPO | Source: Other Acute Inpatient Hospital | Attending: Physician Assistant | Admitting: Physician Assistant

## 2021-05-24 DIAGNOSIS — L97512 Non-pressure chronic ulcer of other part of right foot with fat layer exposed: Secondary | ICD-10-CM | POA: Diagnosis not present

## 2021-05-24 DIAGNOSIS — I872 Venous insufficiency (chronic) (peripheral): Secondary | ICD-10-CM | POA: Diagnosis not present

## 2021-05-24 DIAGNOSIS — I10 Essential (primary) hypertension: Secondary | ICD-10-CM | POA: Diagnosis not present

## 2021-05-24 DIAGNOSIS — L988 Other specified disorders of the skin and subcutaneous tissue: Secondary | ICD-10-CM | POA: Diagnosis not present

## 2021-05-24 NOTE — Progress Notes (Signed)
MIRANDA, GARBER ([id number]) Visit Report for 05/24/2021 Arrival Information Details Patient Name: Date of Service: Tyler [name] 05/24/2021 11:15 A M Medical Record Number: [id number] Patient Account Number: [id number] Date of Birth/Sex: Treating RN: 1925-09-14 (88 y.o. [name] Primary Care Alessander Sikorski: [name] Other Clinician: Referring Tyeisha Dinan: Treating Dajanae Brophy/Extender: [name] in Treatment: 4 Visit Information History Since Last Visit Added or deleted any medications: No Patient Arrived: Ambulatory Any new allergies or adverse reactions: No Arrival Time: 11:28 Had a fall or experienced change in No Accompanied By: wife activities of daily living that may affect Transfer Assistance: None risk of falls: Patient Identification Verified: Yes Signs or symptoms of abuse/neglect since last visito No Secondary Verification Process Completed: Yes Hospitalized since last visit: No Patient Requires Transmission-Based Precautions: No Implantable device outside of the clinic excluding No cellular tissue based products placed in the center since last visit: Has Dressing in Place as Prescribed: Yes Has Compression in Place as Prescribed: Yes Pain Present Now: Yes Notes per patient pain Monday night and yesterday morning. Electronic Signature(s) Signed: 05/24/2021 2:56:41 PM By: Deon Pilling RN, BSN Entered By: Deon Pilling on 05/24/2021 11:28:47 -------------------------------------------------------------------------------- Clinic Level of Care Assessment Details Patient Name: Date of Service: [name], Tyler YBO RNE B. 05/24/2021 11:15 A M Medical Record Number: [id number] Patient Account Number: [id number] Date of Birth/Sex: Treating RN: 09-02-1925 (88 y.o. [name] Primary Care Clover Feehan: [name] Other Clinician: Referring Tillmon Kisling: Treating Lacresia Darwish/Extender: [name] in  Treatment: 4 Clinic Level of Care Assessment Items TOOL 4 Quantity Score X- 1 0 Use when only an EandM is performed on FOLLOW-UP visit ASSESSMENTS - Nursing Assessment / Reassessment X- 1 10 Reassessment of Co-morbidities (includes updates in patient status) X- 1 5 Reassessment of Adherence to Treatment Plan ASSESSMENTS - Wound and Skin A ssessment / Reassessment X - Simple Wound Assessment / Reassessment - one wound 1 5 '[]'$  - 0 Complex Wound Assessment / Reassessment - multiple wounds X- 1 10 Dermatologic / Skin Assessment (not related to wound area) ASSESSMENTS - Focused Assessment '[]'$  - 0 Circumferential Edema Measurements - multi extremities '[]'$  - 0 Nutritional Assessment / Counseling / Intervention '[]'$  - 0 Lower Extremity Assessment (monofilament, tuning fork, pulses) '[]'$  - 0 Peripheral Arterial Disease Assessment (using hand held doppler) ASSESSMENTS - Ostomy and/or Continence Assessment and Care '[]'$  - 0 Incontinence Assessment and Management '[]'$  - 0 Ostomy Care Assessment and Management (repouching, etc.) PROCESS - Coordination of Care X - Simple Patient / Family Education for ongoing care 1 15 '[]'$  - 0 Complex (extensive) Patient / Family Education for ongoing care X- 1 10 Staff obtains Programmer, systems, Records, T Results / Process Orders est '[]'$  - 0 Staff telephones HHA, Nursing Homes / Clarify orders / etc '[]'$  - 0 Routine Transfer to another Facility (non-emergent condition) '[]'$  - 0 Routine Hospital Admission (non-emergent condition) '[]'$  - 0 New Admissions / Biomedical engineer / Ordering NPWT Apligraf, etc. , '[]'$  - 0 Emergency Hospital Admission (emergent condition) X- 1 10 Simple Discharge Coordination '[]'$  - 0 Complex (extensive) Discharge Coordination PROCESS - Special Needs '[]'$  - 0 Pediatric / Minor Patient Management '[]'$  - 0 Isolation Patient Management '[]'$  - 0 Hearing / Language / Visual special needs '[]'$  - 0 Assessment of Community assistance (transportation,  D/C planning, etc.) '[]'$  - 0 Additional assistance / Altered mentation '[]'$  - 0 Support Surface(s) Assessment (bed, cushion, seat, etc.) INTERVENTIONS - Wound Cleansing /  Measurement '[]'$  - 0 Simple Wound Cleansing - one wound '[]'$  - 0 Complex Wound Cleansing - multiple wounds '[]'$  - 0 Wound Imaging (photographs - any number of wounds) '[]'$  - 0 Wound Tracing (instead of photographs) '[]'$  - 0 Simple Wound Measurement - one wound '[]'$  - 0 Complex Wound Measurement - multiple wounds INTERVENTIONS - Wound Dressings '[]'$  - 0 Small Wound Dressing one or multiple wounds '[]'$  - 0 Medium Wound Dressing one or multiple wounds X- 1 20 Large Wound Dressing one or multiple wounds '[]'$  - 0 Application of Medications - topical '[]'$  - 0 Application of Medications - injection INTERVENTIONS - Miscellaneous '[]'$  - 0 External ear exam X- 1 5 Specimen Collection (cultures, biopsies, blood, body fluids, etc.) '[]'$  - 0 Specimen(s) / Culture(s) sent or taken to Lab for analysis '[]'$  - 0 Patient Transfer (multiple staff / Civil Service fast streamer / Similar devices) '[]'$  - 0 Simple Staple / Suture removal (25 or less) '[]'$  - 0 Complex Staple / Suture removal (26 or more) '[]'$  - 0 Hypo / Hyperglycemic Management (close monitor of Blood Glucose) '[]'$  - 0 Ankle / Brachial Index (ABI) - do not check if billed separately X- 1 5 Vital Signs Has the patient been seen at the hospital within the last three years: Yes Total Score: 95 Level Of Care: New/Established - Level 3 Electronic Signature(s) Signed: 05/24/2021 2:56:41 PM By: Deon Pilling RN, BSN Entered By: Deon Pilling on 05/24/2021 11:58:46 -------------------------------------------------------------------------------- Encounter Discharge Information Details Patient Name: Date of Service: [name], Tyler YBO RNE B. 05/24/2021 11:15 A M Medical Record Number: [id number] Patient Account Number: [id number] Date of Birth/Sex: Treating RN: [birth year] (88 y.o. [name] Primary Care  Allis Quirarte: [name] Other Clinician: Referring Nika Yazzie: Treating Rivaan Kendall/Extender: [name] in Treatment: 4 Encounter Discharge Information Items Discharge Condition: Stable Ambulatory Status: Ambulatory Discharge Destination: Home Transportation: Private Auto Accompanied By: wife Schedule Follow-up Appointment: Yes Clinical Summary of Care: Electronic Signature(s) Signed: 05/24/2021 2:56:41 PM By: Deon Pilling RN, BSN Entered By: Deon Pilling on 05/24/2021 11:59:09 -------------------------------------------------------------------------------- Lower Extremity Assessment Details Patient Name: Date of Service: [name], Tyler YBO RNE B. 05/24/2021 11:15 A M Medical Record Number: [id number] Patient Account Number: [id number] Date of Birth/Sex: Treating RN: [birth year] (88 y.o. [name] Primary Care Keaunna Skipper: [name] Other Clinician: Referring Berlin Viereck: Treating Breanna Mcdaniel/Extender: [name], [name] in Treatment: 4 Edema Assessment Assessed: [Left: No] [Right: Yes] Edema: [Left: N] [Right: o] Calf Left: Right: Point of Measurement: 33 cm From Medial Instep 30 cm Ankle Left: Right: Point of Measurement: 8 cm From Medial Instep 20 cm Vascular Assessment Pulses: Dorsalis Pedis Palpable: [Right:Yes] Electronic Signature(s) Signed: 05/24/2021 2:56:41 PM By: Deon Pilling RN, BSN Entered By: Deon Pilling on 05/24/2021 11:36:45 -------------------------------------------------------------------------------- [place] Details Patient Name: Date of Service: [name], Tyler YBO RNE B. 05/24/2021 11:15 A M Medical Record Number: [id number] Patient Account Number: [id number] Date of Birth/Sex: Treating RN: 09/24/1925 (88 y.o. [name] Primary Care Delita Chiquito: [name] Other Clinician: Referring Sylvanus Telford: Treating Marquette Blodgett/Extender: [name],  [name] in Treatment: 4 Active Inactive Wound/Skin Impairment Nursing Diagnoses: Impaired tissue integrity Knowledge deficit related to ulceration/compromised skin integrity Goals: Patient will have a decrease in wound volume by X% from date: (specify in notes) Date Initiated: 04/26/2021 Target Resolution Date: 06/03/2021 Goal Status: Active Patient/caregiver will verbalize understanding of skin care regimen Date Initiated: 04/26/2021 Target Resolution Date: 06/02/2021 Goal Status: Active Ulcer/skin breakdown will have a  volume reduction of 30% by week 4 Date Initiated: 04/26/2021 Target Resolution Date: 06/01/2021 Goal Status: Active Interventions: Assess patient/caregiver ability to obtain necessary supplies Assess patient/caregiver ability to perform ulcer/skin care regimen upon admission and as needed Assess ulceration(s) every visit Notes: Electronic Signature(s) Signed: 05/24/2021 2:56:41 PM By: Deon Pilling RN, BSN Entered By: Deon Pilling on 05/24/2021 11:48:22 -------------------------------------------------------------------------------- Pain Assessment Details Patient Name: Date of Service: [name], Tyler YBO RNE B. 05/24/2021 11:15 A M Medical Record Number: [id number] Patient Account Number: [id number] Date of Birth/Sex: Treating RN: 11/27/1925 (88 y.o. [name] Primary Care Chanell Nadeau: [name] Other Clinician: Referring Thayer Inabinet: Treating Candie Gintz/Extender: [name] in Treatment: 4 Active Problems Location of Pain Severity and Description of Pain Patient Has Paino Yes Site Locations Rate the pain. Current Pain Level: 0 Pain Management and Medication Current Pain Management: Medication: No Cold Application: No Rest: No Massage: No Activity: No T.E.N.S.: No Heat Application: No Leg drop or elevation: No Is the Current Pain Management Adequate: Adequate How does your wound impact your activities of daily  livingo Sleep: No Bathing: No Appetite: No Relationship With Others: No Bladder Continence: No Emotions: No Bowel Continence: No Work: No Toileting: No Drive: No Dressing: No Hobbies: No Notes per patient pain 5/10 during Monday night and Tuesday morning. Electronic Signature(s) Signed: 05/24/2021 2:56:41 PM By: Deon Pilling RN, BSN Entered By: Deon Pilling on 05/24/2021 11:31:10 -------------------------------------------------------------------------------- Patient/Caregiver Education Details Patient Name: Date of Service: [name], Tyler YBO RNE B. 5/24/2023andnbsp11:15 A M Medical Record Number: [id number] Patient Account Number: [id number] Date of Birth/Gender: Treating RN: [birth year] (88 y.o. [name] Primary Care Physician: [name] Other Clinician: Referring Physician: Treating Physician/Extender: [name] in Treatment: 4 Education Assessment Education Provided To: Patient Education Topics Provided Infection: Handouts: CDC antimicrobial patient education_English, Infection Prevention and Management Methods: Explain/Verbal Responses: Reinforcements needed Electronic Signature(s) Signed: 05/24/2021 2:56:41 PM By: Deon Pilling RN, BSN Entered By: Deon Pilling on 05/24/2021 11:48:54 -------------------------------------------------------------------------------- Wound Assessment Details Patient Name: Date of Service: [name], Tyler YBO RNE B. 05/24/2021 11:15 A M Medical Record Number: [id number] Patient Account Number: [id number] Date of Birth/Sex: Treating RN: 1925/02/28 (88 y.o. [name] Primary Care Asley Baskerville: [name] Other Clinician: Referring Sevyn Markham: Treating Bridger Pizzi/Extender: [name], [name] in Treatment: 4 Wound Status Wound Number: 1 Primary Etiology: Atypical Wound Location: Right, Dorsal Foot Wound Status: Open Wounding Event: Gradually  Appeared Comorbid History: Coronary Artery Disease, Hypertension, Osteoarthritis Date Acquired: 04/26/2020 Weeks Of Treatment: 4 Clustered Wound: No Photos Wound Measurements Length: (cm) 1.9 Width: (cm) 2.8 Depth: (cm) 0.1 Area: (cm) 4.178 Volume: (cm) 0.418 % Reduction in Area: 3.3% % Reduction in Volume: 51.6% Epithelialization: Small (1-33%) Tunneling: No Undermining: No Wound Description Classification: Full Thickness With Exposed Support Structures Wound Margin: Distinct, outline attached Exudate Amount: Medium Exudate Type: Purulent Exudate Color: yellow, brown, green Foul Odor After Cleansing: Yes Due to Product Use: No Slough/Fibrino Yes Wound Bed Granulation Amount: Medium (34-66%) Exposed Structure Granulation Quality: Red, Pink Fascia Exposed: No Necrotic Amount: Medium (34-66%) Fat Layer (Subcutaneous Tissue) Exposed: Yes Necrotic Quality: Adherent Slough Tendon Exposed: No Muscle Exposed: No Joint Exposed: No Bone Exposed: No Treatment Notes Wound #1 (Foot) Wound Laterality: Dorsal, Right Cleanser Soap and Water Discharge Instruction: May shower and wash wound with dial antibacterial soap and water prior to dressing change. Wound Cleanser Discharge Instruction: Cleanse the wound with wound cleanser prior to applying a clean dressing using  gauze sponges, not tissue or cotton balls. Peri-Wound Care Topical Primary Dressing Hydrofera Blue Ready Foam, 2.5 x2.5 in Discharge Instruction: Apply to wound bed as instructed Secondary Dressing ABD Pad, 5x9 Discharge Instruction: Apply over primary dressing as directed. Secured With Compression Wrap Kerlix Roll 4.5x3.1 (in/yd) Discharge Instruction: Apply Kerlix and Coban compression as directed. Coban Self-Adherent Wrap 4x5 (in/yd) Discharge Instruction: Apply over Kerlix as directed. Compression Stockings Add-Ons Electronic Signature(s) Signed: 05/24/2021 2:56:41 PM By: Deon Pilling RN, BSN Entered  By: Deon Pilling on 05/24/2021 11:37:53 -------------------------------------------------------------------------------- Vitals Details Patient Name: Date of Service: [name], Tyler YBO RNE B. 05/24/2021 11:15 A M Medical Record Number: [id number] Patient Account Number: [id number] Date of Birth/Sex: Treating RN: 03-31-1925 (88 y.o. [name] Primary Care Johnel Yielding: [name] Other Clinician: Referring Maxie Slovacek: Treating Mattheo Swindle/Extender: [name], [name] in Treatment: 4 Vital Signs Time Taken: 11:25 Temperature (F): 97.6 Height (in): 72 Pulse (bpm): 71 Weight (lbs): 170 Respiratory Rate (breaths/min): 20 Body Mass Index (BMI): 23.1 Blood Pressure (mmHg): 172/70 Reference Range: 80 - 120 mg / dl Electronic Signature(s) Signed: 05/24/2021 2:56:41 PM By: Deon Pilling RN, BSN Entered By: Deon Pilling on 05/24/2021 11:30:25

## 2021-05-24 NOTE — Progress Notes (Addendum)
[name], [name] ([id number]) Visit Report for 05/24/2021 Chief Complaint Document Details Patient Name: Date of Service: [name], [name] 05/24/2021 11:15 A M Medical Record Number: [id number] Patient Account Number: [id number] Date of Birth/Sex: Treating RN: 02-Jul-1925 (88 y.o. M) Primary Care Provider: [name] Other Clinician: Referring Provider: Treating Provider/Extender: [name], [name] in Treatment: 4 Information Obtained from: Patient Chief Complaint Right dorsal foot ulcer Electronic Signature(s) Signed: 05/24/2021 11:04:38 AM By: [name] PA-C Entered By: [name] on 05/24/2021 11:04:38 -------------------------------------------------------------------------------- HPI Details Patient Name: Date of Service: [name], [name] 05/24/2021 11:15 A M Medical Record Number: [id number] Patient Account Number: [id number] Date of Birth/Sex: Treating RN: [birth year] (88 y.o. M) Primary Care Provider: [name] Other Clinician: Referring Provider: Treating Provider/Extender: [name], [name] in Treatment: 4 History of Present Illness HPI Description: 04-26-2021 upon evaluation today patient presents for initial inspection here in the clinic has been having an issue with a dorsal foot wound on the right foot for the past year. He tells me at this time that he does not even really know what brought this on. Fortunately he does not seem to have any signs of infection here and has been given some acting medication which was benzyl peroxide forward from dermatology that really has not helped. He has not had anything really to put on it from a dressing standpoint other than just utilizing some Neosporin over-the-counter for the most part. Nonetheless he figured he would come get this checked out since have seen his wife before and we have been able to get her healed he is hoping we can do the same  here. Patient does have a history of hypertension, chronic venous insufficiency though he does wear compression socks that he does not appear to be swollen at this time. He also has a history of coronary artery disease. 05-03-2021 upon evaluation today patient appears to be doing well with regard to the wound on the top of his foot this is actually showing signs of excellent improvement which is great news and overall I am extremely pleased with where we stand today. There does not appear to be any evidence of active infection locally or systemically which is great news. 05-10-2021 upon evaluation today patient appears to be doing about the same in regard to his wound. Unfortunately I do still think that we are not making significant progress here. I believe that the patient probably needs something a little bit better to manage this effectively. I really believe the compression sock would not be optimal in this case as it is continuing to pull the dressing off. For that reason I think the best bet is probably going to be for [place] to look into compression wrapping. We have avoided this due to the fact that we did have ABIs and the patient is somewhat leery to want to go down that road he tells me that at his age, 65, he is really not interested in a lot of evaluation and treatment with regard to his blood flow. I completely understand that but this very well may be part of our issue here. Nonetheless he is wanting to opt for a lighter compression wrap to see if that could be beneficial and if we get this healed without a lot of other testing and so long. 05-17-2021 upon evaluation today patient appears to be doing well with regard to his wound although were not significantly smaller here.  Fortunately I do not see any evidence of active infection locally or systemically which is great news and overall I am pleased in that regard. No fevers, chills, nausea, vomiting, or diarrhea. Again we have discussed the  possibility of a biopsy if things were not getting better. The wound is slightly better the leg is significantly smaller. With that being said he had a lot of drainage over the past week which again does affect things to some degree as well for that reason I am probably hold off on a biopsy for 1 more week if we do a biopsy and will need to be a fairly superficial punch as I do not want to do anything too deep in case blood flow is an issue here. He has really declined going for any significant arterial studies and therefore I cannot be certain that his blood flow is sufficient to sustain a aggressive intervention in that regard. Nonetheless we may need to know for sure whether this is any significant evidence of a cancerous lesion or otherwise. 05-24-2021 upon evaluation today patient's wound really is not showing significant improvement at all unfortunately. I do believe at this time that he would be a candidate for a couple things were going to try to switch up his dressings to the degree and had an antibiotic, also can obtain a culture and subsequently I am going to continue to suggest that if things or not better by next week that we should probably do a culture. In fact that is my plan unless he is significantly smaller come next week. The patient voiced understanding. Electronic Signature(s) Signed: 05/24/2021 3:10:45 PM By: [name] PA-C Entered By: [name] on 05/24/2021 15:10:45 -------------------------------------------------------------------------------- Physical Exam Details Patient Name: Date of Service: [name], [name] 05/24/2021 11:15 A M Medical Record Number: [id number] Patient Account Number: [id number] Date of Birth/Sex: Treating RN: 08-01-1925 (88 y.o. M) Primary Care Provider: [name] Other Clinician: Referring Provider: Treating Provider/Extender: [name], [name] in Treatment: 4 Constitutional Well-nourished and  well-hydrated in no acute distress. Respiratory normal breathing without difficulty. Psychiatric this patient is able to make decisions and demonstrates good insight into disease process. Alert and Oriented x 3. pleasant and cooperative. Notes Upon inspection patient's wound bed actually showed signs of good granulation and epithelization at this point. Fortunately I do not see any signs of infection although it is also not significantly smaller there is not a lot of erythema or warmth he has good pulses and excellent capillary refill. Again I am really not certain exactly what is going on here that has led to this stalling as badly as it happens but nonetheless this has become an ongoing issue. Its been present for quite some time as well and to be perfectly honest I do believe that the patient would benefit from a biopsy if we do not see improvement by next week. Electronic Signature(s) Signed: 05/24/2021 3:11:35 PM By: [name] PA-C Entered By: [name] on 05/24/2021 15:11:35 -------------------------------------------------------------------------------- Physician Orders Details Patient Name: Date of Service: [name], [name] 05/24/2021 11:15 A M Medical Record Number: [id number] Patient Account Number: [id number] Date of Birth/Sex: Treating RN: [birth year] (88 y.o. [name] Primary Care Provider: [name] Other Clinician: Referring Provider: Treating Provider/Extender: [name] in Treatment: 4 Verbal / Phone Orders: No Diagnosis Coding ICD-10 Coding Code Description L98.8 Other specified disorders of the skin and subcutaneous tissue L97.512  Non-pressure chronic ulcer of other part of right foot with fat layer exposed I25.10 Atherosclerotic heart disease of native coronary artery without angina pectoris I10 Essential (primary) hypertension I87.2 Venous insufficiency (chronic) (peripheral) Follow-up  Appointments ppointment in 1 week. Jeri Cos, [place] and [place] Room # 9 Wednesday 1000 05/31/2021 Return [place], [place] and [place] Room # 9 Wednesday 1045 6/7//2023 Other: - culture taken- will call you when results return and if oral antibiotics need to be changed. ***Pick up oral antibiotics and Probiotics from pharmacy today and start taking.*** Bathing/ Shower/ Hygiene May shower with protection but do not get wound dressing(s) wet. - You may shower using a cast protector over top of your wrap!!! Edema Control - Lymphedema / SCD / Other Elevate legs to the level of the heart or above for 30 minutes daily and/or when sitting, a frequency of: - 3-4 times a day throughout the day. Avoid standing for long periods of time. Patient to wear own compression stockings every day. Wound Treatment Wound #1 - Foot Wound Laterality: Dorsal, Right Cleanser: Soap and Water Every Other Day/15 Days Discharge Instructions: May shower and wash wound with dial antibacterial soap and water prior to dressing change. Cleanser: Wound Cleanser (Generic) Every Other Day/15 Days Discharge Instructions: Cleanse the wound with wound cleanser prior to applying a clean dressing using gauze sponges, not tissue or cotton balls. Prim Dressing: Hydrofera Blue Ready Foam, 2.5 x2.5 in Every Other Day/15 Days ary Discharge Instructions: Apply to wound bed as instructed Secondary Dressing: ABD Pad, 5x9 Every Other Day/15 Days Discharge Instructions: Apply over primary dressing as directed. Compression Wrap: Kerlix Roll 4.5x3.1 (in/yd) Every Other Day/15 Days Discharge Instructions: Apply Kerlix and Coban compression as directed. Compression Wrap: Coban Self-Adherent Wrap 4x5 (in/yd) Every Other Day/15 Days Discharge Instructions: Apply over Kerlix as directed. Laboratory naerobe culture (MICRO) - someone will call you once culture results return. - (ICD10 [phone] - Bacteria identified in Unspecified specimen by  A Non-pressure chronic ulcer of other part of right foot with fat layer exposed) LOINC Code: 623-7 Convenience Name: Anaerobic culture Patient Medications llergies: No Known Allergies A Notifications Medication Indication Start End 05/24/2021 doxycycline hyclate DOSE 1 - oral 100 mg capsule - 1 capsule oral taken 2 times per day for 14 days Electronic Signature(s) Signed: 05/24/2021 11:59:40 AM By: [name] PA-C Entered By: [name] on 05/24/2021 11:59:40 -------------------------------------------------------------------------------- Problem List Details Patient Name: Date of Service: [name], [name] 05/24/2021 11:15 A M Medical Record Number: [id number] Patient Account Number: [id number] Date of Birth/Sex: Treating RN: 06/19/1925 (88 y.o. M) Primary Care Provider: [name] Other Clinician: Referring Provider: Treating Provider/Extender: [name] in Treatment: 4 Active Problems ICD-10 Encounter Code Description Active Date MDM Diagnosis L98.8 Other specified disorders of the skin and subcutaneous tissue 04/26/2021 No Yes L97.512 Non-pressure chronic ulcer of other part of right foot with fat layer exposed 04/26/2021 No Yes I25.10 Atherosclerotic heart disease of native coronary artery without angina pectoris 04/26/2021 No Yes I10 Essential (primary) hypertension 04/26/2021 No Yes I87.2 Venous insufficiency (chronic) (peripheral) 04/26/2021 No Yes Inactive Problems Resolved Problems Electronic Signature(s) Signed: 05/24/2021 11:04:21 AM By: [name] PA-C Entered By: [name] on 05/24/2021 11:04:21 -------------------------------------------------------------------------------- Progress Note Details Patient Name: Date of Service: [name], [name] 05/24/2021 11:15 A M Medical Record Number: [id number] Patient Account Number: [id number] Date of Birth/Sex: Treating RN: 02/27/1925 (88 y.o. M) Primary Care  Provider:  [name] Other Clinician: Referring Provider: Treating Provider/Extender: [name] in Treatment: 4 Subjective Chief Complaint Information obtained from Patient Right dorsal foot ulcer History of Present Illness (HPI) 04-26-2021 upon evaluation today patient presents for initial inspection here in the clinic has been having an issue with a dorsal foot wound on the right foot for the past year. He tells me at this time that he does not even really know what brought this on. Fortunately he does not seem to have any signs of infection here and has been given some acting medication which was benzyl peroxide forward from dermatology that really has not helped. He has not had anything really to put on it from a dressing standpoint other than just utilizing some Neosporin over-the-counter for the most part. Nonetheless he figured he would come get this checked out since have seen his wife before and we have been able to get her healed he is hoping we can do the same here. Patient does have a history of hypertension, chronic venous insufficiency though he does wear compression socks that he does not appear to be swollen at this time. He also has a history of coronary artery disease. 05-03-2021 upon evaluation today patient appears to be doing well with regard to the wound on the top of his foot this is actually showing signs of excellent improvement which is great news and overall I am extremely pleased with where we stand today. There does not appear to be any evidence of active infection locally or systemically which is great news. 05-10-2021 upon evaluation today patient appears to be doing about the same in regard to his wound. Unfortunately I do still think that we are not making significant progress here. I believe that the patient probably needs something a little bit better to manage this effectively. I really believe the compression sock would  not be optimal in this case as it is continuing to pull the dressing off. For that reason I think the best bet is probably going to be for [place] to look into compression wrapping. We have avoided this due to the fact that we did have ABIs and the patient is somewhat leery to want to go down that road he tells me that at his age, 59, he is really not interested in a lot of evaluation and treatment with regard to his blood flow. I completely understand that but this very well may be part of our issue here. Nonetheless he is wanting to opt for a lighter compression wrap to see if that could be beneficial and if we get this healed without a lot of other testing and so long. 05-17-2021 upon evaluation today patient appears to be doing well with regard to his wound although were not significantly smaller here. Fortunately I do not see any evidence of active infection locally or systemically which is great news and overall I am pleased in that regard. No fevers, chills, nausea, vomiting, or diarrhea. Again we have discussed the possibility of a biopsy if things were not getting better. The wound is slightly better the leg is significantly smaller. With that being said he had a lot of drainage over the past week which again does affect things to some degree as well for that reason I am probably hold off on a biopsy for 1 more week if we do a biopsy and will need to be a fairly superficial punch as I do not want to do anything too deep in  case blood flow is an issue here. He has really declined going for any significant arterial studies and therefore I cannot be certain that his blood flow is sufficient to sustain a aggressive intervention in that regard. Nonetheless we may need to know for sure whether this is any significant evidence of a cancerous lesion or otherwise. 05-24-2021 upon evaluation today patient's wound really is not showing significant improvement at all unfortunately. I do believe at this time that  he would be a candidate for a couple things were going to try to switch up his dressings to the degree and had an antibiotic, also can obtain a culture and subsequently I am going to continue to suggest that if things or not better by next week that we should probably do a culture. In fact that is my plan unless he is significantly smaller come next week. The patient voiced understanding. Objective Constitutional Well-nourished and well-hydrated in no acute distress. Vitals Time Taken: 11:25 AM, Height: 72 in, Weight: 170 lbs, BMI: 23.1, Temperature: 97.6 F, Pulse: 71 bpm, Respiratory Rate: 20 breaths/min, Blood Pressure: 172/70 mmHg. Respiratory normal breathing without difficulty. Psychiatric this patient is able to make decisions and demonstrates good insight into disease process. Alert and Oriented x 3. pleasant and cooperative. General Notes: Upon inspection patient's wound bed actually showed signs of good granulation and epithelization at this point. Fortunately I do not see any signs of infection although it is also not significantly smaller there is not a lot of erythema or warmth he has good pulses and excellent capillary refill. Again I am really not certain exactly what is going on here that has led to this stalling as badly as it happens but nonetheless this has become an ongoing issue. Its been present for quite some time as well and to be perfectly honest I do believe that the patient would benefit from a biopsy if we do not see improvement by next week. Integumentary (Hair, Skin) Wound #1 status is Open. Original cause of wound was Gradually Appeared. The date acquired was: 04/26/2020. The wound has been in treatment 4 weeks. The wound is located on the Right,Dorsal Foot. The wound measures 1.9cm length x 2.8cm width x 0.1cm depth; 4.178cm^2 area and 0.418cm^3 volume. There is Fat Layer (Subcutaneous Tissue) exposed. There is no tunneling or undermining noted. There is a medium  amount of purulent drainage noted. Foul odor after cleansing was noted. The wound margin is distinct with the outline attached to the wound base. There is medium (34-66%) red, pink granulation within the wound bed. There is a medium (34-66%) amount of necrotic tissue within the wound bed including Adherent Slough. Assessment Active Problems ICD-10 Other specified disorders of the skin and subcutaneous tissue Non-pressure chronic ulcer of other part of right foot with fat layer exposed Atherosclerotic heart disease of native coronary artery without angina pectoris Essential (primary) hypertension Venous insufficiency (chronic) (peripheral) Plan Follow-up Appointments: Return Appointment in 1 week. Jeri Cos, PA and [place] Room # 9 Wednesday 1000 05/31/2021 Jeri Cos, [place] and [place] Room # 9 Wednesday 1045 6/7//2023 Other: - culture taken- will call you when results return and if oral antibiotics need to be changed. ***Pick up oral antibiotics and Probiotics from pharmacy today and start taking.*** Bathing/ Shower/ Hygiene: May shower with protection but do not get wound dressing(s) wet. - You may shower using a cast protector over top of your wrap!!! Edema Control - Lymphedema / SCD / Other: Elevate legs to the level of  the heart or above for 30 minutes daily and/or when sitting, a frequency of: - 3-4 times a day throughout the day. Avoid standing for long periods of time. Patient to wear own compression stockings every day. Laboratory ordered were: Anaerobic culture - someone will call you once culture results return. The following medication(s) was prescribed: doxycycline hyclate oral 100 mg capsule 1 1 capsule oral taken 2 times per day for 14 days starting 05/24/2021 WOUND #1: - Foot Wound Laterality: Dorsal, Right Cleanser: Soap and Water Every Other Day/15 Days Discharge Instructions: May shower and wash wound with dial antibacterial soap and water prior to dressing  change. Cleanser: Wound Cleanser (Generic) Every Other Day/15 Days Discharge Instructions: Cleanse the wound with wound cleanser prior to applying a clean dressing using gauze sponges, not tissue or cotton balls. Prim Dressing: Hydrofera Blue Ready Foam, 2.5 x2.5 in Every Other Day/15 Days ary Discharge Instructions: Apply to wound bed as instructed Secondary Dressing: ABD Pad, 5x9 Every Other Day/15 Days Discharge Instructions: Apply over primary dressing as directed. Com pression Wrap: Kerlix Roll 4.5x3.1 (in/yd) Every Other Day/15 Days Discharge Instructions: Apply Kerlix and Coban compression as directed. Com pression Wrap: Coban Self-Adherent Wrap 4x5 (in/yd) Every Other Day/15 Days Discharge Instructions: Apply over Kerlix as directed. 1. I am going to recommend currently that we going continue with the wound care measures as before and the patient is in agreement with plan. This includes the use of the Curlex and Coban wrap. 2. I am going to switch to [facility] that she gets to drainage better than the collagen. 3. We will use an ABD pad over top and roll gauze followed by Coban to secure in place for compression sake. 4. I am going to place him on an antibiotic as well and I did send in a prescription for doxycycline for him today and this does not interact with any of his medications significantly which is the best news. We will see how things do over the next week. 5. If he is not doing better but come next week I do think the best option will be for [place] to take a small punch biopsy in order to further evaluate the situation. We will see patient back for reevaluation in 1 week here in the clinic. If anything worsens or changes patient will contact our office for additional recommendations. Electronic Signature(s) Signed: 05/24/2021 3:12:31 PM By: [name] PA-C Entered By: [name] on 05/24/2021  15:12:30 -------------------------------------------------------------------------------- SuperBill Details Patient Name: Date of Service: [name], [name] 05/24/2021 Medical Record Number: [id number] Patient Account Number: [id number] Date of Birth/Sex: Treating RN: April 13, 1925 (88 y.o. [name] Primary Care Provider: [name] Other Clinician: Referring Provider: Treating Provider/Extender: [name], [name] in Treatment: 4 Diagnosis Coding ICD-10 Codes Code Description L98.8 Other specified disorders of the skin and subcutaneous tissue L97.512 Non-pressure chronic ulcer of other part of right foot with fat layer exposed I25.10 Atherosclerotic heart disease of native coronary artery without angina pectoris I10 Essential (primary) hypertension I87.2 Venous insufficiency (chronic) (peripheral) Facility Procedures CPT4 Code: [id number] Description: 99213 - WOUND CARE VISIT-LEV 3 EST PT Modifier: Quantity: 1 Physician Procedures : CPT4 Code Description Modifier [id number] - WC PHYS LEVEL 4 - EST PT ICD-10 Diagnosis Description L98.8 Other specified disorders of the skin and subcutaneous tissue L97.512 Non-pressure chronic ulcer of other part of right foot with fat layer  exposed I25.10 Atherosclerotic heart disease of native coronary artery without  angina pectoris I10 Essential (primary) hypertension Quantity: 1 Electronic Signature(s) Signed: 05/24/2021 3:13:05 PM By: [name] PA-C Previous Signature: 05/24/2021 2:56:41 PM Version By: Deon Pilling RN, BSN Entered By: [name] on 05/24/2021 15:13:05

## 2021-05-29 LAB — AEROBIC/ANAEROBIC CULTURE W GRAM STAIN (SURGICAL/DEEP WOUND): Gram Stain: NONE SEEN

## 2021-05-31 ENCOUNTER — Encounter (HOSPITAL_BASED_OUTPATIENT_CLINIC_OR_DEPARTMENT_OTHER): Payer: PPO | Admitting: Physician Assistant

## 2021-05-31 DIAGNOSIS — I872 Venous insufficiency (chronic) (peripheral): Secondary | ICD-10-CM | POA: Diagnosis not present

## 2021-05-31 DIAGNOSIS — L97512 Non-pressure chronic ulcer of other part of right foot with fat layer exposed: Secondary | ICD-10-CM | POA: Diagnosis not present

## 2021-05-31 DIAGNOSIS — L988 Other specified disorders of the skin and subcutaneous tissue: Secondary | ICD-10-CM | POA: Diagnosis not present

## 2021-05-31 DIAGNOSIS — I10 Essential (primary) hypertension: Secondary | ICD-10-CM | POA: Diagnosis not present

## 2021-05-31 NOTE — Progress Notes (Signed)
[name], [name] ([id number]) Visit Report for 05/31/2021 Arrival Information Details Patient Name: Date of Service: HA [name] 05/31/2021 1:45 PM Medical Record Number: [id number] Patient Account Number: [id number] Date of Birth/Sex: Treating RN: 1925/12/31 (88 y.o. [name] Primary Care [name]: [name] Other Clinician: Referring [name]: Treating [name]/Extender: [name] in Treatment: 5 Visit Information History Since Last Visit Added or deleted any medications: No Patient Arrived: [name] Any new allergies or adverse reactions: No Arrival Time: 13:51 Had a fall or experienced change in No Accompanied By: wife activities of daily living that may affect Transfer Assistance: None risk of falls: Patient Identification Verified: Yes Signs or symptoms of abuse/neglect since last visito No Secondary Verification Process Completed: Yes Hospitalized since last visit: No Patient Requires Transmission-Based Precautions: No Implantable device outside of the clinic excluding No cellular tissue based products placed in the center since last visit: Has Dressing in Place as Prescribed: Yes Has Compression in Place as Prescribed: Yes Pain Present Now: No Electronic Signature(s) Signed: 05/31/2021 5:29:55 PM By: Tyler Pilling RN, BSN Entered By: [name] on 05/31/2021 13:52:58 -------------------------------------------------------------------------------- Clinic Level of Care Assessment Details Patient Name: Date of Service: [name], [name] 05/31/2021 1:45 PM Medical Record Number: [id number] Patient Account Number: [id number] Date of Birth/Sex: Treating RN: 09/08/1925 (88 y.o. [name], [name] Primary Care [name]: [name] Other Clinician: Referring [name]: Treating Allicia Culley/Extender: [name], [name] in Treatment: 5 Clinic Level of Care Assessment Items TOOL 4  Quantity Score X- 1 0 Use when only an EandM is performed on FOLLOW-UP visit ASSESSMENTS - Nursing Assessment / Reassessment X- 1 10 Reassessment of Co-morbidities (includes updates in patient status) X- 1 5 Reassessment of Adherence to Treatment Plan ASSESSMENTS - Wound and Skin A ssessment / Reassessment X - Simple Wound Assessment / Reassessment - one wound 1 5 '[]'$  - 0 Complex Wound Assessment / Reassessment - multiple wounds '[]'$  - 0 Dermatologic / Skin Assessment (not related to wound area) ASSESSMENTS - Focused Assessment X- 1 5 Circumferential Edema Measurements - multi extremities '[]'$  - 0 Nutritional Assessment / Counseling / Intervention '[]'$  - 0 Lower Extremity Assessment (monofilament, tuning fork, pulses) '[]'$  - 0 Peripheral Arterial Disease Assessment (using hand held doppler) ASSESSMENTS - Ostomy and/or Continence Assessment and Care '[]'$  - 0 Incontinence Assessment and Management '[]'$  - 0 Ostomy Care Assessment and Management (repouching, etc.) PROCESS - Coordination of Care X - Simple Patient / Family Education for ongoing care 1 15 '[]'$  - 0 Complex (extensive) Patient / Family Education for ongoing care X- 1 10 Staff obtains Programmer, systems, Records, T Results / Process Orders est '[]'$  - 0 Staff telephones HHA, Nursing Homes / Clarify orders / etc '[]'$  - 0 Routine Transfer to another Facility (non-emergent condition) '[]'$  - 0 Routine Hospital Admission (non-emergent condition) '[]'$  - 0 New Admissions / Biomedical engineer / Ordering NPWT Apligraf, etc. , '[]'$  - 0 Emergency Hospital Admission (emergent condition) X- 1 10 Simple Discharge Coordination '[]'$  - 0 Complex (extensive) Discharge Coordination PROCESS - Special Needs '[]'$  - 0 Pediatric / Minor Patient Management '[]'$  - 0 Isolation Patient Management '[]'$  - 0 Hearing / Language / Visual special needs '[]'$  - 0 Assessment of Community assistance (transportation, D/C planning, etc.) '[]'$  - 0 Additional assistance / Altered  mentation '[]'$  - 0 Support Surface(s) Assessment (bed, cushion, seat, etc.) INTERVENTIONS - Wound Cleansing / Measurement X - Simple Wound Cleansing - one wound 1 5 '[]'$  -  0 Complex Wound Cleansing - multiple wounds X- 1 5 Wound Imaging (photographs - any number of wounds) '[]'$  - 0 Wound Tracing (instead of photographs) X- 1 5 Simple Wound Measurement - one wound '[]'$  - 0 Complex Wound Measurement - multiple wounds INTERVENTIONS - Wound Dressings '[]'$  - 0 Small Wound Dressing one or multiple wounds X- 1 15 Medium Wound Dressing one or multiple wounds '[]'$  - 0 Large Wound Dressing one or multiple wounds X- 1 5 Application of Medications - topical '[]'$  - 0 Application of Medications - injection INTERVENTIONS - Miscellaneous '[]'$  - 0 External ear exam '[]'$  - 0 Specimen Collection (cultures, biopsies, blood, body fluids, etc.) '[]'$  - 0 Specimen(s) / Culture(s) sent or taken to Lab for analysis '[]'$  - 0 Patient Transfer (multiple staff / Civil Service fast streamer / Similar devices) '[]'$  - 0 Simple Staple / Suture removal (25 or less) '[]'$  - 0 Complex Staple / Suture removal (26 or more) '[]'$  - 0 Hypo / Hyperglycemic Management (close monitor of Blood Glucose) '[]'$  - 0 Ankle / Brachial Index (ABI) - do not check if billed separately X- 1 5 Vital Signs Has the patient been seen at the hospital within the last three years: Yes Total Score: 100 Level Of Care: New/Established - Level 3 Electronic Signature(s) Signed: 05/31/2021 3:17:18 PM By: Tyler Hammock RN Entered By: [name] on 05/31/2021 14:26:15 -------------------------------------------------------------------------------- Encounter Discharge Information Details Patient Name: Date of Service: [name], [name] 05/31/2021 1:45 PM Medical Record Number: [id number] Patient Account Number: [id number] Date of Birth/Sex: Treating RN: Dec 15, 1925 (88 y.o. [name], [name] Primary Care Jemmie Rhinehart: [name] Other Clinician: Referring  [name]: Treating [name]/Extender: [name] in Treatment: 5 Encounter Discharge Information Items Discharge Condition: Stable Ambulatory Status: Ambulatory Discharge Destination: Home Transportation: Private Auto Accompanied By: wife Schedule Follow-up Appointment: Yes Clinical Summary of Care: Patient Declined Electronic Signature(s) Signed: 05/31/2021 3:17:18 PM By: Tyler Hammock RN Entered By: [name] on 05/31/2021 14:26:49 -------------------------------------------------------------------------------- Lower Extremity Assessment Details Patient Name: Date of Service: [name], [name] 05/31/2021 1:45 PM Medical Record Number: [id number] Patient Account Number: [id number] Date of Birth/Sex: Treating RN: 08-24-1925 (88 y.o. [name] Primary Care Zabian Swayne: [name] Other Clinician: Referring Alexandrina Fiorini: Treating Penne Rosenstock/Extender: [name], [name] in Treatment: 5 Edema Assessment Assessed: [Left: No] [Right: Yes] Edema: [Left: N] [Right: o] Calf Left: Right: Point of Measurement: 33 cm From Medial Instep 29 cm Ankle Left: Right: Point of Measurement: 8 cm From Medial Instep 20.5 cm Vascular Assessment Pulses: Dorsalis Pedis Palpable: [Right:Yes] Electronic Signature(s) Signed: 05/31/2021 5:29:55 PM By: Tyler Pilling RN, BSN Entered By: [name] on 05/31/2021 13:59:20 -------------------------------------------------------------------------------- Multi-Disciplinary Care Plan Details Patient Name: Date of Service: [name], [name] 05/31/2021 1:45 PM Medical Record Number: [id number] Patient Account Number: [id number] Date of Birth/Sex: Treating RN: 02-Nov-1925 (88 y.o. [name], [name] Primary Care Kort Stettler: [name] Other Clinician: Referring Emmery Seiler: Treating Amily Depp/Extender: [name], [name] in Treatment: 5 Active  Inactive Wound/Skin Impairment Nursing Diagnoses: Impaired tissue integrity Knowledge deficit related to ulceration/compromised skin integrity Goals: Patient will have a decrease in wound volume by X% from date: (specify in notes) Date Initiated: 04/26/2021 Target Resolution Date: 06/03/2021 Goal Status: Active Patient/caregiver will verbalize understanding of skin care regimen Date Initiated: 04/26/2021 Target Resolution Date: 06/02/2021 Goal Status: Active Ulcer/skin breakdown will have a volume reduction of 30% by week 4 Date Initiated: 04/26/2021 Target Resolution Date: 06/01/2021 Goal  Status: Active Interventions: Assess patient/caregiver ability to obtain necessary supplies Assess patient/caregiver ability to perform ulcer/skin care regimen upon admission and as needed Assess ulceration(s) every visit Notes: Electronic Signature(s) Signed: 05/31/2021 3:17:18 PM By: Tyler Hammock RN Entered By: [name] on 05/31/2021 14:13:26 -------------------------------------------------------------------------------- Pain Assessment Details Patient Name: Date of Service: [name], [name] 05/31/2021 1:45 PM Medical Record Number: [id number] Patient Account Number: [id number] Date of Birth/Sex: Treating RN: [birth year] (88 y.o. [name] Primary Care Harumi Yamin: [name] Other Clinician: Referring Azani Brogdon: Treating Trevion Hoben/Extender: [name] in Treatment: 5 Active Problems Location of Pain Severity and Description of Pain Patient Has Paino No Site Locations Rate the pain. Current Pain Level: 0 Pain Management and Medication Current Pain Management: Medication: No Cold Application: No Rest: No Massage: No Activity: No T.E.N.S.: No Heat Application: No Leg drop or elevation: No Is the Current Pain Management Adequate: Adequate How does your wound impact your activities of daily livingo Sleep: No Bathing:  No Appetite: No Relationship With Others: No Bladder Continence: No Emotions: No Bowel Continence: No Work: No Toileting: No Drive: No Dressing: No Hobbies: No Engineer, maintenance) Signed: 05/31/2021 5:29:55 PM By: Tyler Pilling RN, BSN Entered By: [name] on 05/31/2021 13:54:52 -------------------------------------------------------------------------------- Patient/Caregiver Education Details Patient Name: Date of Service: [name], [name] 5/31/2023andnbsp1:45 PM Medical Record Number: [id number] Patient Account Number: [id number] Date of Birth/Gender: Treating RN: [birth year] (88 y.o. Erie Noe Primary Care Physician: [name] Other Clinician: Referring Physician: Treating Physician/Extender: [name] in Treatment: 5 Education Assessment Education Provided To: Patient Education Topics Provided Wound/Skin Impairment: Methods: Explain/Verbal Responses: Reinforcements needed, State content correctly Electronic Signature(s) Signed: 05/31/2021 3:17:18 PM By: Tyler Hammock RN Entered By: [name] on 05/31/2021 14:13:39 -------------------------------------------------------------------------------- Wound Assessment Details Patient Name: Date of Service: [name], [name] 05/31/2021 1:45 PM Medical Record Number: [id number] Patient Account Number: [id number] Date of Birth/Sex: Treating RN: 1925/07/26 (88 y.o. [name] Primary Care Horst Ostermiller: [name] Other Clinician: Referring Annabeth Tortora: Treating Xandria Gallaga/Extender: [name], [name] in Treatment: 5 Wound Status Wound Number: 1 Primary Etiology: Atypical Wound Location: Right, Dorsal Foot Wound Status: Open Wounding Event: Gradually Appeared Comorbid History: Coronary Artery Disease, Hypertension, Osteoarthritis Date Acquired: 04/26/2020 [name] Of Treatment: 5 Clustered Wound: No Photos Wound  Measurements Length: (cm) 2.2 Width: (cm) 2.8 Depth: (cm) 0.1 Area: (cm) 4.838 Volume: (cm) 0.484 % Reduction in Area: -12% % Reduction in Volume: 44% Epithelialization: Small (1-33%) Tunneling: No Undermining: No Wound Description Classification: Full Thickness With Exposed Support Structures Wound Margin: Distinct, outline attached Exudate Amount: Medium Exudate Type: Purulent Exudate Color: yellow, brown, green Foul Odor After Cleansing: Yes Due to Product Use: No Slough/Fibrino Yes Wound Bed Granulation Amount: Large (67-100%) Exposed Structure Granulation Quality: Red, Pink Fascia Exposed: No Necrotic Amount: Small (1-33%) Fat Layer (Subcutaneous Tissue) Exposed: Yes Necrotic Quality: Adherent Slough Tendon Exposed: No Muscle Exposed: No Joint Exposed: No Bone Exposed: No Assessment Notes Maceration present. Treatment Notes Wound #1 (Foot) Wound Laterality: Dorsal, Right Cleanser Soap and Water Discharge Instruction: May shower and wash wound with dial antibacterial soap and water prior to dressing change. Wound Cleanser Discharge Instruction: Cleanse the wound with wound cleanser prior to applying a clean dressing using gauze sponges, not tissue or cotton balls. Peri-Wound Care Topical Primary Dressing Hydrofera Blue Ready Foam, 2.5 x2.5 in Discharge Instruction: Apply to wound bed as instructed Secondary Dressing ABD Pad, 5x9  Discharge Instruction: Apply over primary dressing as directed. Secured With Compression Wrap Kerlix Roll 4.5x3.1 (in/yd) Discharge Instruction: Apply Kerlix and Coban compression as directed. Coban Self-Adherent Wrap 4x5 (in/yd) Discharge Instruction: Apply over Kerlix as directed. Compression Stockings Add-Ons Electronic Signature(s) Signed: 05/31/2021 5:29:55 PM By: Tyler Pilling RN, BSN Entered By: [name] on 05/31/2021 14:02:11 -------------------------------------------------------------------------------- Vitals  Details Patient Name: Date of Service: [name], [name] 05/31/2021 1:45 PM Medical Record Number: [id number] Patient Account Number: [id number] Date of Birth/Sex: Treating RN: [birth year] (88 y.o. [name] Primary Care Farouk Vivero: [name] Other Clinician: Referring Joselyne Spake: Treating Zaya Kessenich/Extender: [name], [name] in Treatment: 5 Vital Signs Time Taken: 13:53 Temperature (F): 97.7 Height (in): 72 Pulse (bpm): 73 Weight (lbs): 170 Respiratory Rate (breaths/min): 20 Body Mass Index (BMI): 23.1 Blood Pressure (mmHg): 165/72 Reference Range: 80 - 120 mg / dl Electronic Signature(s) Signed: 05/31/2021 5:29:55 PM By: Tyler Pilling RN, BSN Entered By: [name] on 05/31/2021 13:54:37

## 2021-05-31 NOTE — Progress Notes (Addendum)
[name], [name] ([id number]) Visit Report for 05/31/2021 Chief Complaint Document Details Patient Name: Date of Service: [name] [name] B. 05/31/2021 1:45 PM Medical Record Number: [id number] Patient Account Number: [id number] Date of Birth/Sex: Treating RN: 1925/12/04 (88 y.o. [name], [name] Primary Care Provider: [name] Other Clinician: Referring Provider: Treating Provider/Extender: [name], [name] in Treatment: 5 Information Obtained from: Patient Chief Complaint Right dorsal foot ulcer Electronic Signature(s) Signed: 05/31/2021 2:06:49 PM By: Tyler Keeler PA-C Entered By: [name] on 05/31/2021 14:06:49 -------------------------------------------------------------------------------- HPI Details Patient Name: Date of Service: [name] [name], [name] B. 05/31/2021 1:45 PM Medical Record Number: [id number] Patient Account Number: [id number] Date of Birth/Sex: Treating RN: 1925-03-31 (88 y.o. [name], [name] Primary Care Provider: [name] Other Clinician: Referring Provider: Treating Provider/Extender: [name] in Treatment: 5 History of Present Illness HPI Description: 04-26-2021 upon evaluation today patient presents for initial inspection here in the clinic has been having an issue with a dorsal foot wound on the right foot for the past year. He tells me at this time that he does not even really know what brought this on. Fortunately he does not seem to have any signs of infection here and has been given some acting medication which was benzyl peroxide forward from dermatology that really has not helped. He has not had anything really to put on it from a dressing standpoint other than just utilizing some Neosporin over-the-counter for the most part. Nonetheless he figured he would come get this checked out since have seen his wife before and we have been able to get her healed he is  hoping we can do the same here. Patient does have a history of hypertension, chronic venous insufficiency though he does wear compression socks that he does not appear to be swollen at this time. He also has a history of coronary artery disease. 05-03-2021 upon evaluation today patient appears to be doing well with regard to the wound on the top of his foot this is actually showing signs of excellent improvement which is great news and overall I am extremely pleased with where we stand today. There does not appear to be any evidence of active infection locally or systemically which is great news. 05-10-2021 upon evaluation today patient appears to be doing about the same in regard to his wound. Unfortunately I do still think that we are not making significant progress here. I believe that the patient probably needs something a little bit better to manage this effectively. I really believe the compression sock would not be optimal in this case as it is continuing to pull the dressing off. For that reason I think the best bet is probably going to be for [place] to look into compression wrapping. We have avoided this due to the fact that we did have ABIs and the patient is somewhat leery to want to go down that road he tells me that at his age, 53, he is really not interested in a lot of evaluation and treatment with regard to his blood flow. I completely understand that but this very well may be part of our issue here. Nonetheless he is wanting to opt for a lighter compression wrap to see if that could be beneficial and if we get this healed without a lot of other testing and so long. 05-17-2021 upon evaluation today patient appears to be doing well with regard to his wound although were not significantly  smaller here. Fortunately I do not see any evidence of active infection locally or systemically which is great news and overall I am pleased in that regard. No fevers, chills, nausea, vomiting, or diarrhea.  Again we have discussed the possibility of a biopsy if things were not getting better. The wound is slightly better the leg is significantly smaller. With that being said he had a lot of drainage over the past week which again does affect things to some degree as well for that reason I am probably hold off on a biopsy for 1 more week if we do a biopsy and will need to be a fairly superficial punch as I do not want to do anything too deep in case blood flow is an issue here. He has really declined going for any significant arterial studies and therefore I cannot be certain that his blood flow is sufficient to sustain a aggressive intervention in that regard. Nonetheless we may need to know for sure whether this is any significant evidence of a cancerous lesion or otherwise. 05-24-2021 upon evaluation today patient's wound really is not showing significant improvement at all unfortunately. I do believe at this time that he would be a candidate for a couple things were going to try to switch up his dressings to the degree and had an antibiotic, also can obtain a culture and subsequently I am going to continue to suggest that if things or not better by next week that we should probably do a culture. In fact that is my plan unless he is significantly smaller come next week. The patient voiced understanding. 05-31-2021 upon evaluation today patient's wound is looking a little better but still about the same size. I did get his results back from the culture it did show that he had evidence of bacterial infection noted at this point. This included 2 bacteria neither which are actually going to be managed with the doxycycline. For that reason I Georgina Peer recommend that he stop that and we will get a place him on cefdinir. He was positive for both Enterobacter as well as Proteus. Both of which were sensitive to the cefdinir. Electronic Signature(s) Signed: 05/31/2021 3:35:26 PM By: Tyler Keeler PA-C Entered By:  [name] on 05/31/2021 15:35:26 -------------------------------------------------------------------------------- Physical Exam Details Patient Name: Date of Service: [name] [name], [name] B. 05/31/2021 1:45 PM Medical Record Number: [id number] Patient Account Number: [id number] Date of Birth/Sex: Treating RN: [birth year] (88 y.o. Erie Noe Primary Care Provider: [name] Other Clinician: Referring Provider: Treating Provider/Extender: [name], [name] in Treatment: 5 Constitutional Well-nourished and well-hydrated in no acute distress. Respiratory normal breathing without difficulty. Psychiatric this patient is able to make decisions and demonstrates good insight into disease process. Alert and Oriented x 3. pleasant and cooperative. Notes Upon inspection patient's wound bed actually showed signs again of looking better although not being much smaller. Still I think this is not really responding quite as well as I like to see and it could be related to the infection I do believe the [facility] did much better in regard to the overall appearance of the wound bed is concerned. With that being said I still not terribly happy with how things are proceeding. He has again refused previous arterial evaluation although I do not believe there is any evidence of arterial insufficiency here that is always a possibility but he has excellent blood flow and good capillary refill. My biggest concern could be the possibility  of a skin cancer depending on how he responds to the current antibiotic the neck step would be a biopsy. Electronic Signature(s) Signed: 05/31/2021 3:37:17 PM By: Tyler Keeler PA-C Entered By: [name] on 05/31/2021 15:37:17 -------------------------------------------------------------------------------- Physician Orders Details Patient Name: Date of Service: [name] [name], [name] B. 05/31/2021 1:45 PM Medical Record Number:  [id number] Patient Account Number: [id number] Date of Birth/Sex: Treating RN: 06-Apr-1925 (88 y.o. [name], [name] Primary Care Provider: [name] Other Clinician: Referring Provider: Treating Provider/Extender: [name] in Treatment: 5 Verbal / Phone Orders: No Diagnosis Coding ICD-10 Coding Code Description L98.8 Other specified disorders of the skin and subcutaneous tissue L97.512 Non-pressure chronic ulcer of other part of right foot with fat layer exposed I25.10 Atherosclerotic heart disease of native coronary artery without angina pectoris I10 Essential (primary) hypertension I87.2 Venous insufficiency (chronic) (peripheral) Follow-up Appointments ppointment in 1 week. Jeri Cos, PA (Dr. Dellia Nims covering) and [facility] Room # 9 Wednesday 1515 06/07/21 Return A Other: - culture taken- will call you when results return and if oral antibiotics need to be changed. ***Pick up oral antibiotics and Probiotics from pharmacy today and start taking.*** Bathing/ Shower/ Hygiene May shower with protection but do not get wound dressing(s) wet. - You may shower using a cast protector over top of your wrap!!! Edema Control - Lymphedema / SCD / Other Elevate legs to the level of the heart or above for 30 minutes daily and/or when sitting, a frequency of: - 3-4 times a day throughout the day. Avoid standing for long periods of time. Patient to wear own compression stockings every day. Wound Treatment Wound #1 - Foot Wound Laterality: Dorsal, Right Cleanser: Soap and Water Every Other Day/15 Days Discharge Instructions: May shower and wash wound with dial antibacterial soap and water prior to dressing change. Cleanser: Wound Cleanser (Generic) Every Other Day/15 Days Discharge Instructions: Cleanse the wound with wound cleanser prior to applying a clean dressing using gauze sponges, not tissue or cotton balls. Prim Dressing: Hydrofera Blue Ready Foam,  2.5 x2.5 in Every Other Day/15 Days ary Discharge Instructions: Apply to wound bed as instructed Secondary Dressing: ABD Pad, 5x9 Every Other Day/15 Days Discharge Instructions: Apply over primary dressing as directed. Compression Wrap: Kerlix Roll 4.5x3.1 (in/yd) Every Other Day/15 Days Discharge Instructions: Apply Kerlix and Coban compression as directed. Compression Wrap: Coban Self-Adherent Wrap 4x5 (in/yd) Every Other Day/15 Days Discharge Instructions: Apply over Kerlix as directed. Patient Medications llergies: No Known Allergies A Notifications Medication Indication Start End 05/31/2021 cefdinir DOSE 1 - oral 300 mg capsule - 1 capsule oral taken 2 times per day for 14 days Electronic Signature(s) Signed: 05/31/2021 3:17:18 PM By: [name] Signed: 05/31/2021 4:11:22 PM By: Tyler Keeler PA-C Previous Signature: 05/31/2021 2:21:12 PM Version By: Tyler Keeler PA-C Entered By: Rhae Hammock on 05/31/2021 14:23:40 -------------------------------------------------------------------------------- Problem List Details Patient Name: Date of Service: [name] [name], [name] B. 05/31/2021 1:45 PM Medical Record Number: [id number] Patient Account Number: [id number] Date of Birth/Sex: Treating RN: 02-04-1925 (88 y.o. [name], [name] Primary Care Provider: [name] Other Clinician: Referring Provider: Treating Provider/Extender: [name] in Treatment: 5 Active Problems ICD-10 Encounter Code Description Active Date MDM Diagnosis L98.8 Other specified disorders of the skin and subcutaneous tissue 04/26/2021 No Yes L97.512 Non-pressure chronic ulcer of other part of right foot with fat layer exposed 04/26/2021 No Yes I25.10 Atherosclerotic heart disease of native coronary artery  without angina pectoris 04/26/2021 No Yes I10 Essential (primary) hypertension 04/26/2021 No Yes I87.2 Venous insufficiency (chronic) (peripheral)  04/26/2021 No Yes Inactive Problems Resolved Problems Electronic Signature(s) Signed: 05/31/2021 2:06:41 PM By: Tyler Keeler PA-C Entered By: [name] on 05/31/2021 14:06:41 -------------------------------------------------------------------------------- Progress Note Details Patient Name: Date of Service: [name] [name], [name] B. 05/31/2021 1:45 PM Medical Record Number: [id number] Patient Account Number: [id number] Date of Birth/Sex: Treating RN: Mar 22, 1925 (88 y.o. [name], [name] Primary Care Provider: [name] Other Clinician: Referring Provider: Treating Provider/Extender: [name] in Treatment: 5 Subjective Chief Complaint Information obtained from Patient Right dorsal foot ulcer History of Present Illness (HPI) 04-26-2021 upon evaluation today patient presents for initial inspection here in the clinic has been having an issue with a dorsal foot wound on the right foot for the past year. He tells me at this time that he does not even really know what brought this on. Fortunately he does not seem to have any signs of infection here and has been given some acting medication which was benzyl peroxide forward from dermatology that really has not helped. He has not had anything really to put on it from a dressing standpoint other than just utilizing some Neosporin over-the-counter for the most part. Nonetheless he figured he would come get this checked out since have seen his wife before and we have been able to get her healed he is hoping we can do the same here. Patient does have a history of hypertension, chronic venous insufficiency though he does wear compression socks that he does not appear to be swollen at this time. He also has a history of coronary artery disease. 05-03-2021 upon evaluation today patient appears to be doing well with regard to the wound on the top of his foot this is actually showing signs of  excellent improvement which is great news and overall I am extremely pleased with where we stand today. There does not appear to be any evidence of active infection locally or systemically which is great news. 05-10-2021 upon evaluation today patient appears to be doing about the same in regard to his wound. Unfortunately I do still think that we are not making significant progress here. I believe that the patient probably needs something a little bit better to manage this effectively. I really believe the compression sock would not be optimal in this case as it is continuing to pull the dressing off. For that reason I think the best bet is probably going to be for [place] to look into compression wrapping. We have avoided this due to the fact that we did have ABIs and the patient is somewhat leery to want to go down that road he tells me that at his age, 38, he is really not interested in a lot of evaluation and treatment with regard to his blood flow. I completely understand that but this very well may be part of our issue here. Nonetheless he is wanting to opt for a lighter compression wrap to see if that could be beneficial and if we get this healed without a lot of other testing and so long. 05-17-2021 upon evaluation today patient appears to be doing well with regard to his wound although were not significantly smaller here. Fortunately I do not see any evidence of active infection locally or systemically which is great news and overall I am pleased in that regard. No fevers, chills, nausea, vomiting, or diarrhea. Again we have discussed  the possibility of a biopsy if things were not getting better. The wound is slightly better the leg is significantly smaller. With that being said he had a lot of drainage over the past week which again does affect things to some degree as well for that reason I am probably hold off on a biopsy for 1 more week if we do a biopsy and will need to be a fairly superficial  punch as I do not want to do anything too deep in case blood flow is an issue here. He has really declined going for any significant arterial studies and therefore I cannot be certain that his blood flow is sufficient to sustain a aggressive intervention in that regard. Nonetheless we may need to know for sure whether this is any significant evidence of a cancerous lesion or otherwise. 05-24-2021 upon evaluation today patient's wound really is not showing significant improvement at all unfortunately. I do believe at this time that he would be a candidate for a couple things were going to try to switch up his dressings to the degree and had an antibiotic, also can obtain a culture and subsequently I am going to continue to suggest that if things or not better by next week that we should probably do a culture. In fact that is my plan unless he is significantly smaller come next week. The patient voiced understanding. 05-31-2021 upon evaluation today patient's wound is looking a little better but still about the same size. I did get his results back from the culture it did show that he had evidence of bacterial infection noted at this point. This included 2 bacteria neither which are actually going to be managed with the doxycycline. For that reason I Georgina Peer recommend that he stop that and we will get a place him on cefdinir. He was positive for both Enterobacter as well as Proteus. Both of which were sensitive to the cefdinir. Objective Constitutional Well-nourished and well-hydrated in no acute distress. Vitals Time Taken: 1:53 PM, Height: 72 in, Weight: 170 lbs, BMI: 23.1, Temperature: 97.7 F, Pulse: 73 bpm, Respiratory Rate: 20 breaths/min, Blood Pressure: 165/72 mmHg. Respiratory normal breathing without difficulty. Psychiatric this patient is able to make decisions and demonstrates good insight into disease process. Alert and Oriented x 3. pleasant and cooperative. General Notes: Upon  inspection patient's wound bed actually showed signs again of looking better although not being much smaller. Still I think this is not really responding quite as well as I like to see and it could be related to the infection I do believe the [facility] did much better in regard to the overall appearance of the wound bed is concerned. With that being said I still not terribly happy with how things are proceeding. He has again refused previous arterial evaluation although I do not believe there is any evidence of arterial insufficiency here that is always a possibility but he has excellent blood flow and good capillary refill. My biggest concern could be the possibility of a skin cancer depending on how he responds to the current antibiotic the neck step would be a biopsy. Integumentary (Hair, Skin) Wound #1 status is Open. Original cause of wound was Gradually Appeared. The date acquired was: 04/26/2020. The wound has been in treatment 5 weeks. The wound is located on the Right,Dorsal Foot. The wound measures 2.2cm length x 2.8cm width x 0.1cm depth; 4.838cm^2 area and 0.484cm^3 volume. There is Fat Layer (Subcutaneous Tissue) exposed. There is no tunneling or undermining  noted. There is a medium amount of purulent drainage noted. Foul odor after cleansing was noted. The wound margin is distinct with the outline attached to the wound base. There is large (67-100%) red, pink granulation within the wound bed. There is a small (1-33%) amount of necrotic tissue within the wound bed including Adherent Slough. General Notes: Maceration present. Assessment Active Problems ICD-10 Other specified disorders of the skin and subcutaneous tissue Non-pressure chronic ulcer of other part of right foot with fat layer exposed Atherosclerotic heart disease of native coronary artery without angina pectoris Essential (primary) hypertension Venous insufficiency (chronic) (peripheral) Plan Follow-up  Appointments: Return Appointment in 1 week. Jeri Cos, PA (Dr. Dellia Nims covering) and [facility] Room # 9 Wednesday 1515 06/07/21 Other: - culture taken- will call you when results return and if oral antibiotics need to be changed. ***Pick up oral antibiotics and Probiotics from pharmacy today and start taking.*** Bathing/ Shower/ Hygiene: May shower with protection but do not get wound dressing(s) wet. - You may shower using a cast protector over top of your wrap!!! Edema Control - Lymphedema / SCD / Other: Elevate legs to the level of the heart or above for 30 minutes daily and/or when sitting, a frequency of: - 3-4 times a day throughout the day. Avoid standing for long periods of time. Patient to wear own compression stockings every day. The following medication(s) was prescribed: cefdinir oral 300 mg capsule 1 1 capsule oral taken 2 times per day for 14 days starting 05/31/2021 WOUND #1: - Foot Wound Laterality: Dorsal, Right Cleanser: Soap and Water Every Other Day/15 Days Discharge Instructions: May shower and wash wound with dial antibacterial soap and water prior to dressing change. Cleanser: Wound Cleanser (Generic) Every Other Day/15 Days Discharge Instructions: Cleanse the wound with wound cleanser prior to applying a clean dressing using gauze sponges, not tissue or cotton balls. Prim Dressing: Hydrofera Blue Ready Foam, 2.5 x2.5 in Every Other Day/15 Days ary Discharge Instructions: Apply to wound bed as instructed Secondary Dressing: ABD Pad, 5x9 Every Other Day/15 Days Discharge Instructions: Apply over primary dressing as directed. Com pression Wrap: Kerlix Roll 4.5x3.1 (in/yd) Every Other Day/15 Days Discharge Instructions: Apply Kerlix and Coban compression as directed. Com pression Wrap: Coban Self-Adherent Wrap 4x5 (in/yd) Every Other Day/15 Days Discharge Instructions: Apply over Kerlix as directed. 1. I am good I recommend that we go ahead and place the patient on cefdinir  which I think is good to be a good option based on what we are seeing currently for the bacteria noted. 2. Also can recommend that we have the patient go ahead and initiate treatment with continuation of the [facility] followed by the ABD pad and then were doing a Kerlix and Coban wrap just to do something light since we do not have the arterial studies he seems to be tolerating that without any complication whatsoever. 3. I am also can recommend that if things or not improving in the next 1 to 2 weeks we proceed with a biopsy explained to the patient the main thing I be looking for here is whether or not there is a chance of a cancerous lesion here which could be 1 reason why its not responding as well. We at least need to make sure of this if he does not see improvement with the appropriate antibiotics which were initiating today and I did send that into the pharmacy for him. We will see patient back for reevaluation in 1 week here in  the clinic. If anything worsens or changes patient will contact our office for additional recommendations. Electronic Signature(s) Signed: 05/31/2021 3:38:21 PM By: Tyler Keeler PA-C Entered By: [name] on 05/31/2021 15:38:21 -------------------------------------------------------------------------------- SuperBill Details Patient Name: Date of Service: [name] [name], [name] B. 05/31/2021 Medical Record Number: [id number] Patient Account Number: [id number] Date of Birth/Sex: Treating RN: 03-16-1925 (88 y.o. [name], [name] Primary Care Provider: [name] Other Clinician: Referring Provider: Treating Provider/Extender: [name], [name] in Treatment: 5 Diagnosis Coding ICD-10 Codes Code Description L98.8 Other specified disorders of the skin and subcutaneous tissue L97.512 Non-pressure chronic ulcer of other part of right foot with fat layer exposed I25.10 Atherosclerotic heart disease of native coronary  artery without angina pectoris I10 Essential (primary) hypertension I87.2 Venous insufficiency (chronic) (peripheral) Facility Procedures CPT4 Code: [id number] Description: 99213 - WOUND CARE VISIT-LEV 3 EST PT Modifier: Quantity: 1 Physician Procedures : CPT4 Code Description Modifier [id number] - WC PHYS LEVEL 4 - EST PT ICD-10 Diagnosis Description L98.8 Other specified disorders of the skin and subcutaneous tissue L97.512 Non-pressure chronic ulcer of other part of right foot with fat layer  exposed I25.10 Atherosclerotic heart disease of native coronary artery without angina pectoris I10 Essential (primary) hypertension Quantity: 1 Electronic Signature(s) Signed: 05/31/2021 3:38:41 PM By: Tyler Keeler PA-C Previous Signature: 05/31/2021 3:17:18 PM Version By: [name] Previous Signature: 05/31/2021 3:17:18 PM Version By: [name] Entered By: [name] on 05/31/2021 15:38:40

## 2021-06-07 ENCOUNTER — Encounter (HOSPITAL_BASED_OUTPATIENT_CLINIC_OR_DEPARTMENT_OTHER): Payer: PPO | Attending: Physician Assistant | Admitting: Internal Medicine

## 2021-06-07 DIAGNOSIS — I872 Venous insufficiency (chronic) (peripheral): Secondary | ICD-10-CM | POA: Insufficient documentation

## 2021-06-07 DIAGNOSIS — I2510 Atherosclerotic heart disease of native coronary artery without angina pectoris: Secondary | ICD-10-CM | POA: Insufficient documentation

## 2021-06-07 DIAGNOSIS — D500 Iron deficiency anemia secondary to blood loss (chronic): Secondary | ICD-10-CM | POA: Diagnosis not present

## 2021-06-07 DIAGNOSIS — I495 Sick sinus syndrome: Secondary | ICD-10-CM | POA: Diagnosis not present

## 2021-06-07 DIAGNOSIS — R7303 Prediabetes: Secondary | ICD-10-CM | POA: Diagnosis not present

## 2021-06-07 DIAGNOSIS — N1832 Chronic kidney disease, stage 3b: Secondary | ICD-10-CM | POA: Diagnosis not present

## 2021-06-07 DIAGNOSIS — L97512 Non-pressure chronic ulcer of other part of right foot with fat layer exposed: Secondary | ICD-10-CM | POA: Diagnosis not present

## 2021-06-07 DIAGNOSIS — I700 Atherosclerosis of aorta: Secondary | ICD-10-CM | POA: Diagnosis not present

## 2021-06-07 DIAGNOSIS — I10 Essential (primary) hypertension: Secondary | ICD-10-CM | POA: Diagnosis not present

## 2021-06-08 NOTE — Progress Notes (Signed)
MATIN, MATTIOLI ([id number]) Visit Report for 06/07/2021 HPI Details Patient Name: Date of Service: [name], [name] 06/07/2021 3:15 PM Medical Record Number: [id number] Patient Account Number: [id number] Date of Birth/Sex: Treating RN: [birth year] (88 y.o. [name], [name] Primary Care Provider: [name] Other Clinician: Referring Provider: Treating Provider/Extender: Maryelizabeth Kaufmann in Treatment: 6 History of Present Illness HPI Description: 04-26-2021 upon evaluation today patient presents for initial inspection here in the clinic has been having an issue with a dorsal foot wound on the right foot for the past year. He tells me at this time that he does not even really know what brought this on. Fortunately he does not seem to have any signs of infection here and has been given some acting medication which was benzyl peroxide forward from dermatology that really has not helped. He has not had anything really to put on it from a dressing standpoint other than just utilizing some Neosporin over-the-counter for the most part. Nonetheless he figured he would come get this checked out since have seen his wife before and we have been able to get her healed he is hoping we can do the same here. Patient does have a history of hypertension, chronic venous insufficiency though he does wear compression socks that he does not appear to be swollen at this time. He also has a history of coronary artery disease. 05-03-2021 upon evaluation today patient appears to be doing well with regard to the wound on the top of his foot this is actually showing signs of excellent improvement which is great news and overall I am extremely pleased with where we stand today. There does not appear to be any evidence of active infection locally or systemically which is great news. 05-10-2021 upon evaluation today patient appears to be doing about the same in regard to his wound.  Unfortunately I do still think that we are not making significant progress here. I believe that the patient probably needs something a little bit better to manage this effectively. I really believe the compression sock would not be optimal in this case as it is continuing to pull the dressing off. For that reason I think the best bet is probably going to be for [place] to look into compression wrapping. We have avoided this due to the fact that we did have ABIs and the patient is somewhat leery to want to go down that road he tells me that at his age, 69, he is really not interested in a lot of evaluation and treatment with regard to his blood flow. I completely understand that but this very well may be part of our issue here. Nonetheless he is wanting to opt for a lighter compression wrap to see if that could be beneficial and if we get this healed without a lot of other testing and so long. 05-17-2021 upon evaluation today patient appears to be doing well with regard to his wound although were not significantly smaller here. Fortunately I do not see any evidence of active infection locally or systemically which is great news and overall I am pleased in that regard. No fevers, chills, nausea, vomiting, or diarrhea. Again we have discussed the possibility of a biopsy if things were not getting better. The wound is slightly better the leg is significantly smaller. With that being said he had a lot of drainage over the past week which again does affect things to some degree as well for that reason I am  probably hold off on a biopsy for 1 more week if we do a biopsy and will need to be a fairly superficial punch as I do not want to do anything too deep in case blood flow is an issue here. He has really declined going for any significant arterial studies and therefore I cannot be certain that his blood flow is sufficient to sustain a aggressive intervention in that regard. Nonetheless we may need to know for sure  whether this is any significant evidence of a cancerous lesion or otherwise. 05-24-2021 upon evaluation today patient's wound really is not showing significant improvement at all unfortunately. I do believe at this time that he would be a candidate for a couple things were going to try to switch up his dressings to the degree and had an antibiotic, also can obtain a culture and subsequently I am going to continue to suggest that if things or not better by next week that we should probably do a culture. In fact that is my plan unless he is significantly smaller come next week. The patient voiced understanding. 05-31-2021 upon evaluation today patient's wound is looking a little better but still about the same size. I did get his results back from the culture it did show that he had evidence of bacterial infection noted at this point. This included 2 bacteria neither which are actually going to be managed with the doxycycline. For that reason I Georgina Peer recommend that he stop that and we will get a place him on cefdinir. He was positive for both Enterobacter as well as Proteus. Both of which were sensitive to the cefdinir. 6/7; wound on the dorsal right first metatarsal head of undetermined etiology that has been present for about a year. He is on antibiotics for 2 weeks cefdinir and tolerating this well. Using Hydrofera Blue on the wound Electronic Signature(s) Signed: 06/07/2021 5:09:19 PM By: Linton Ham MD Entered By: Linton Ham on 06/07/2021 16:31:10 -------------------------------------------------------------------------------- Physical Exam Details Patient Name: Date of Service: [name], [name] 06/07/2021 3:15 PM Medical Record Number: [id number] Patient Account Number: [id number] Date of Birth/Sex: Treating RN: 01-15-1925 (88 y.o. Erie Noe Primary Care Provider: [name] Other Clinician: Referring Provider: Treating Provider/Extender: Maryelizabeth Kaufmann in Treatment: 6 Constitutional Patient is hypertensive.. Pulse regular and within target range for patient.[name] Respirations regular, non-labored and within target range.. Temperature is normal and within the target range for the patient.[name] Appears in no distress. Respiratory work of breathing is normal. Cardiovascular Both the dorsalis pedis and posterior tibial pulses are easily palpable on the right. Notes Wound exam; I think the wound bed looks better than what is been previously described. I used a #3 curette to touch the surface of it does not feel like there is any adherent debris. No debridement was felt to be necessary. As noted his peripheral pulses are easily palpable although his ABI in our clinic was noncompressible. Otherwise I think the surface of the wound looks better but there is not any epithelialization and the dimensions of the wound are essentially unchanged Electronic Signature(s) Signed: 06/07/2021 5:09:19 PM By: Linton Ham MD Entered By: Linton Ham on 06/07/2021 16:33:02 -------------------------------------------------------------------------------- Physician Orders Details Patient Name: Date of Service: [name], [name] 06/07/2021 3:15 PM Medical Record Number: [id number] Patient Account Number: [id number] Date of Birth/Sex: Treating RN: January 27, 1925 (88 y.o. [name], [name] Primary Care Provider: [name] Other Clinician: Referring Provider: Treating Provider/Extender: Drema Dallas  Weeks in Treatment: 6 Verbal / Phone Orders: No Diagnosis Coding Follow-up Appointments ppointment in 1 week. - 06/14/2021 @ 7262!!! Jeri Cos, [place] and [place] # 9 Return A Bathing/ Shower/ Hygiene May shower with protection but do not get wound dressing(s) wet. - You may shower using a cast protector over top of your wrap!!! Edema Control - Lymphedema / SCD / Other Elevate legs to the level of  the heart or above for 30 minutes daily and/or when sitting, a frequency of: - 3-4 times a day throughout the day. Avoid standing for long periods of time. Patient to wear own compression stockings every day. Wound Treatment Wound #1 - Foot Wound Laterality: Dorsal, Right Cleanser: Soap and Water Every Other Day/15 Days Discharge Instructions: May shower and wash wound with dial antibacterial soap and water prior to dressing change. Cleanser: Wound Cleanser (Generic) Every Other Day/15 Days Discharge Instructions: Cleanse the wound with wound cleanser prior to applying a clean dressing using gauze sponges, not tissue or cotton balls. Peri-Wound Care: Zinc Oxide Ointment 30g tube Every Other Day/15 Days Discharge Instructions: Apply Zinc Oxide to periwound with each dressing change Peri-Wound Care: Sween Lotion (Moisturizing lotion) Every Other Day/15 Days Discharge Instructions: Apply moisturizing lotion as directed Prim Dressing: Hydrofera Blue Ready Foam, 2.5 x2.5 in Every Other Day/15 Days ary Discharge Instructions: Apply to wound bed as instructed Secondary Dressing: ABD Pad, 5x9 Every Other Day/15 Days Discharge Instructions: Apply over primary dressing as directed. Compression Wrap: Kerlix Roll 4.5x3.1 (in/yd) Every Other Day/15 Days Discharge Instructions: Apply Kerlix and Coban compression as directed. Compression Wrap: Coban Self-Adherent Wrap 4x5 (in/yd) Every Other Day/15 Days Discharge Instructions: Apply over Kerlix as directed. Electronic Signature(s) Signed: 06/07/2021 5:09:19 PM By: Linton Ham MD Signed: 06/08/2021 4:55:17 PM By: [name] Entered By: Rhae Hammock on 06/07/2021 16:30:51 -------------------------------------------------------------------------------- Problem List Details Patient Name: Date of Service: [name], [name] 06/07/2021 3:15 PM Medical Record Number: [id number] Patient Account Number: [id number] Date of Birth/Sex: Treating  RN: [birth year] (88 y.o. [name], [name] Primary Care Provider: [name] Other Clinician: Referring Provider: Treating Provider/Extender: Maryelizabeth Kaufmann in Treatment: 6 Active Problems ICD-10 Encounter Code Description Active Date MDM Diagnosis L98.8 Other specified disorders of the skin and subcutaneous tissue 04/26/2021 No Yes L97.512 Non-pressure chronic ulcer of other part of right foot with fat layer exposed 04/26/2021 No Yes I25.10 Atherosclerotic heart disease of native coronary artery without angina pectoris 04/26/2021 No Yes I10 Essential (primary) hypertension 04/26/2021 No Yes I87.2 Venous insufficiency (chronic) (peripheral) 04/26/2021 No Yes Inactive Problems Resolved Problems Electronic Signature(s) Signed: 06/07/2021 5:09:19 PM By: Linton Ham MD Entered By: Linton Ham on 06/07/2021 16:29:56 -------------------------------------------------------------------------------- Progress Note Details Patient Name: Date of Service: [name], [name] 06/07/2021 3:15 PM Medical Record Number: [id number] Patient Account Number: [id number] Date of Birth/Sex: Treating RN: 06-26-1925 (88 y.o. [name], [name] Primary Care Provider: [name] Other Clinician: Referring Provider: Treating Provider/Extender: Maryelizabeth Kaufmann in Treatment: 6 Subjective History of Present Illness (HPI) 04-26-2021 upon evaluation today patient presents for initial inspection here in the clinic has been having an issue with a dorsal foot wound on the right foot for the past year. He tells me at this time that he does not even really know what brought this on. Fortunately he does not seem to have any signs of infection here and has been given some acting medication which was benzyl peroxide forward from dermatology that really  has not helped. He has not had anything really to put on it from a dressing standpoint other than  just utilizing some Neosporin over-the-counter for the most part. Nonetheless he figured he would come get this checked out since have seen his wife before and we have been able to get her healed he is hoping we can do the same here. Patient does have a history of hypertension, chronic venous insufficiency though he does wear compression socks that he does not appear to be swollen at this time. He also has a history of coronary artery disease. 05-03-2021 upon evaluation today patient appears to be doing well with regard to the wound on the top of his foot this is actually showing signs of excellent improvement which is great news and overall I am extremely pleased with where we stand today. There does not appear to be any evidence of active infection locally or systemically which is great news. 05-10-2021 upon evaluation today patient appears to be doing about the same in regard to his wound. Unfortunately I do still think that we are not making significant progress here. I believe that the patient probably needs something a little bit better to manage this effectively. I really believe the compression sock would not be optimal in this case as it is continuing to pull the dressing off. For that reason I think the best bet is probably going to be for [place] to look into compression wrapping. We have avoided this due to the fact that we did have ABIs and the patient is somewhat leery to want to go down that road he tells me that at his age, 12, he is really not interested in a lot of evaluation and treatment with regard to his blood flow. I completely understand that but this very well may be part of our issue here. Nonetheless he is wanting to opt for a lighter compression wrap to see if that could be beneficial and if we get this healed without a lot of other testing and so long. 05-17-2021 upon evaluation today patient appears to be doing well with regard to his wound although were not significantly smaller  here. Fortunately I do not see any evidence of active infection locally or systemically which is great news and overall I am pleased in that regard. No fevers, chills, nausea, vomiting, or diarrhea. Again we have discussed the possibility of a biopsy if things were not getting better. The wound is slightly better the leg is significantly smaller. With that being said he had a lot of drainage over the past week which again does affect things to some degree as well for that reason I am probably hold off on a biopsy for 1 more week if we do a biopsy and will need to be a fairly superficial punch as I do not want to do anything too deep in case blood flow is an issue here. He has really declined going for any significant arterial studies and therefore I cannot be certain that his blood flow is sufficient to sustain a aggressive intervention in that regard. Nonetheless we may need to know for sure whether this is any significant evidence of a cancerous lesion or otherwise. 05-24-2021 upon evaluation today patient's wound really is not showing significant improvement at all unfortunately. I do believe at this time that he would be a candidate for a couple things were going to try to switch up his dressings to the degree and had an antibiotic, also can obtain a  culture and subsequently I am going to continue to suggest that if things or not better by next week that we should probably do a culture. In fact that is my plan unless he is significantly smaller come next week. The patient voiced understanding. 05-31-2021 upon evaluation today patient's wound is looking a little better but still about the same size. I did get his results back from the culture it did show that he had evidence of bacterial infection noted at this point. This included 2 bacteria neither which are actually going to be managed with the doxycycline. For that reason I Georgina Peer recommend that he stop that and we will get a place him on cefdinir.  He was positive for both Enterobacter as well as Proteus. Both of which were sensitive to the cefdinir. 6/7; wound on the dorsal right first metatarsal head of undetermined etiology that has been present for about a year. He is on antibiotics for 2 weeks cefdinir and tolerating this well. Using Hydrofera Blue on the wound Objective Constitutional Patient is hypertensive.. Pulse regular and within target range for patient.[name] Respirations regular, non-labored and within target range.. Temperature is normal and within the target range for the patient.[name] Appears in no distress. Vitals Time Taken: 3:57 PM, Height: 72 in, Weight: 170 lbs, BMI: 23.1, Temperature: 97.8 F, Pulse: 69 bpm, Respiratory Rate: 18 breaths/min, Blood Pressure: 176/67 mmHg. Respiratory work of breathing is normal. Cardiovascular Both the dorsalis pedis and posterior tibial pulses are easily palpable on the right. General Notes: Wound exam; I think the wound bed looks better than what is been previously described. I used a #3 curette to touch the surface of it does not feel like there is any adherent debris. No debridement was felt to be necessary. As noted his peripheral pulses are easily palpable although his ABI in our clinic was noncompressible. Otherwise I think the surface of the wound looks better but there is not any epithelialization and the dimensions of the wound are essentially unchanged Integumentary (Hair, Skin) Wound #1 status is Open. Original cause of wound was Gradually Appeared. The date acquired was: 04/26/2020. The wound has been in treatment 6 weeks. The wound is located on the Right,Dorsal Foot. The wound measures 2cm length x 2.8cm width x 0.1cm depth; 4.398cm^2 area and 0.44cm^3 volume. There is Fat Layer (Subcutaneous Tissue) exposed. There is no tunneling or undermining noted. There is a medium amount of purulent drainage noted. Foul odor after cleansing was noted. The wound margin is distinct with the  outline attached to the wound base. There is large (67-100%) red, pink granulation within the wound bed. There is a small (1-33%) amount of necrotic tissue within the wound bed including Adherent Slough. Assessment Active Problems ICD-10 Other specified disorders of the skin and subcutaneous tissue Non-pressure chronic ulcer of other part of right foot with fat layer exposed Atherosclerotic heart disease of native coronary artery without angina pectoris Essential (primary) hypertension Venous insufficiency (chronic) (peripheral) Plan Follow-up Appointments: Return Appointment in 1 week. - 06/14/2021 @ 0932!!! Jeri Cos, [place] and [place] # 9 Bathing/ Shower/ Hygiene: May shower with protection but do not get wound dressing(s) wet. - You may shower using a cast protector over top of your wrap!!! Edema Control - Lymphedema / SCD / Other: Elevate legs to the level of the heart or above for 30 minutes daily and/or when sitting, a frequency of: - 3-4 times a day throughout the day. Avoid standing for long periods of time. Patient to  wear own compression stockings every day. WOUND #1: - Foot Wound Laterality: Dorsal, Right Cleanser: Soap and Water Every Other Day/15 Days Discharge Instructions: May shower and wash wound with dial antibacterial soap and water prior to dressing change. Cleanser: Wound Cleanser (Generic) Every Other Day/15 Days Discharge Instructions: Cleanse the wound with wound cleanser prior to applying a clean dressing using gauze sponges, not tissue or cotton balls. Peri-Wound Care: Zinc Oxide Ointment 30g tube Every Other Day/15 Days Discharge Instructions: Apply Zinc Oxide to periwound with each dressing change Peri-Wound Care: Sween Lotion (Moisturizing lotion) Every Other Day/15 Days Discharge Instructions: Apply moisturizing lotion as directed Prim Dressing: Hydrofera Blue Ready Foam, 2.5 x2.5 in Every Other Day/15 Days ary Discharge Instructions: Apply to wound bed  as instructed Secondary Dressing: ABD Pad, 5x9 Every Other Day/15 Days Discharge Instructions: Apply over primary dressing as directed. Com pression Wrap: Kerlix Roll 4.5x3.1 (in/yd) Every Other Day/15 Days Discharge Instructions: Apply Kerlix and Coban compression as directed. Com pression Wrap: Coban Self-Adherent Wrap 4x5 (in/yd) Every Other Day/15 Days Discharge Instructions: Apply over Kerlix as directed. 1. Wound of undetermined etiology on the right dorsal foot as described. The surface of this wound per description has improved but not much improvement in the dimensions. Otherwise the wound appears relatively nondescript. 2. He is tolerating his antibiotics well. 3. I saw a little reason to change the current dressing which is Hydrofera Blue under 2 layer compression. He has chronic venous insufficiency. 4. He describes a lot of drainage from the wound but there is not much in the way of edema or evidence of infection 5. His arterial pulses are easily palpable and I am doubtful that arterial insufficiency as the primary etiology here. 6. I do not disagree with the thought of a biopsy if we can get any improvement in the wound dimensions. He previously saw Dr. [place] Crane of dermatology Electronic Signature(s) Signed: 06/07/2021 5:09:19 PM By: Linton Ham MD Entered By: Linton Ham on 06/07/2021 16:37:42 -------------------------------------------------------------------------------- SuperBill Details Patient Name: Date of Service: [name], [name] 06/07/2021 Medical Record Number: [id number] Patient Account Number: [id number] Date of Birth/Sex: Treating RN: 18-Apr-1925 (88 y.o. [name], [name] Primary Care Provider: [name] Other Clinician: Referring Provider: Treating Provider/Extender: Maryelizabeth Kaufmann in Treatment: 6 Diagnosis Coding ICD-10 Codes Code Description L98.8 Other specified disorders of the skin and subcutaneous  tissue L97.512 Non-pressure chronic ulcer of other part of right foot with fat layer exposed I25.10 Atherosclerotic heart disease of native coronary artery without angina pectoris I10 Essential (primary) hypertension I87.2 Venous insufficiency (chronic) (peripheral) Facility Procedures CPT4 Code: [id number] Description: 99213 - WOUND CARE VISIT-LEV 3 EST PT Modifier: Quantity: 1 Physician Procedures : CPT4 Code Description Modifier [id number] - WC PHYS LEVEL 4 - EST PT ICD-10 Diagnosis Description L98.8 Other specified disorders of the skin and subcutaneous tissue L97.512 Non-pressure chronic ulcer of other part of right foot with fat layer  exposed Quantity: 1 Electronic Signature(s) Signed: 06/07/2021 5:09:19 PM By: Linton Ham MD Signed: 06/08/2021 4:55:17 PM By: [name] Entered By: Rhae Hammock on 06/07/2021 16:39:15

## 2021-06-13 NOTE — Progress Notes (Signed)
AMROM, ORE ([id number]) Visit Report for 06/07/2021 Arrival Information Details Patient Name: Date of Service: Tyler [name] RNE B. 06/07/2021 3:15 PM Medical Record Number: [id number] Patient Account Number: [id number] Date of Birth/Sex: Treating RN: 1925/02/16 (88 y.o. [name], [name] Primary Care [name]: [name] Other Clinician: Referring [name]: Treating [name]/Extender: [name] in Treatment: 6 Visit Information History Since Last Visit Added or deleted any medications: No Patient Arrived: [name] Any new allergies or adverse reactions: No Arrival Time: 15:51 Had a fall or experienced change in No Accompanied By: wife activities of daily living that may affect Transfer Assistance: None risk of falls: Patient Identification Verified: Yes Signs or symptoms of abuse/neglect since last visito No Secondary Verification Process Completed: Yes Hospitalized since last visit: No Patient Requires Transmission-Based Precautions: No Implantable device outside of the clinic excluding No cellular tissue based products placed in the center since last visit: Has Dressing in Place as Prescribed: Yes Has Compression in Place as Prescribed: Yes Pain Present Now: No Electronic Signature(s) Signed: 06/13/2021 8:46:10 AM By: [name] Entered By: [name] on 06/07/2021 15:57:09 -------------------------------------------------------------------------------- Clinic Level of Care Assessment Details Patient Name: Date of Service: Tyler LL, [name] 06/07/2021 3:15 PM Medical Record Number: [id number] Patient Account Number: [id number] Date of Birth/Sex: Treating RN: December 13, 1925 (88 y.o.o. [name], [name] Primary Care [name]: [name] Other Clinician: Referring Kieara Schwark: Treating [name]/Extender: [name] in Treatment: 6 Clinic Level of Care Assessment Items TOOL 4 Quantity  Score X- 1 0 Use when only an EandM is performed on FOLLOW-UP visit ASSESSMENTS - Nursing Assessment / Reassessment X- 1 10 Reassessment of Co-morbidities (includes updates in patient status) X- 1 5 Reassessment of Adherence to Treatment Plan ASSESSMENTS - Wound and Skin A ssessment / Reassessment X - Simple Wound Assessment / Reassessment - one wound 1 5 '[]'$  - 0 Complex Wound Assessment / Reassessment - multiple wounds '[]'$  - 0 Dermatologic / Skin Assessment (not related to wound area) ASSESSMENTS - Focused Assessment X- 1 5 Circumferential Edema Measurements - multi extremities '[]'$  - 0 Nutritional Assessment / Counseling / Intervention '[]'$  - 0 Lower Extremity Assessment (monofilament, tuning fork, pulses) '[]'$  - 0 Peripheral Arterial Disease Assessment (using hand held doppler) ASSESSMENTS - Ostomy and/or Continence Assessment and Care '[]'$  - 0 Incontinence Assessment and Management '[]'$  - 0 Ostomy Care Assessment and Management (repouching, etc.) PROCESS - Coordination of Care X - Simple Patient / Family Education for ongoing care 1 15 '[]'$  - 0 Complex (extensive) Patient / Family Education for ongoing care X- 1 10 Staff obtains Programmer, systems, Records, T Results / Process Orders est '[]'$  - 0 Staff telephones HHA, Nursing Homes / Clarify orders / etc '[]'$  - 0 Routine Transfer to another Facility (non-emergent condition) '[]'$  - 0 Routine Hospital Admission (non-emergent condition) '[]'$  - 0 New Admissions / Biomedical engineer / Ordering NPWT Apligraf, etc. , '[]'$  - 0 Emergency Hospital Admission (emergent condition) X- 1 10 Simple Discharge Coordination '[]'$  - 0 Complex (extensive) Discharge Coordination PROCESS - Special Needs '[]'$  - 0 Pediatric / Minor Patient Management '[]'$  - 0 Isolation Patient Management '[]'$  - 0 Hearing / Language / Visual special needs '[]'$  - 0 Assessment of Community assistance (transportation, D/C planning, etc.) '[]'$  - 0 Additional assistance / Altered  mentation '[]'$  - 0 Support Surface(s) Assessment (bed, cushion, seat, etc.) INTERVENTIONS - Wound Cleansing / Measurement X - Simple Wound Cleansing - one wound 1 5 '[]'$  - 0 Complex  Wound Cleansing - multiple wounds X- 1 5 Wound Imaging (photographs - any number of wounds) '[]'$  - 0 Wound Tracing (instead of photographs) X- 1 5 Simple Wound Measurement - one wound '[]'$  - 0 Complex Wound Measurement - multiple wounds INTERVENTIONS - Wound Dressings X - Small Wound Dressing one or multiple wounds 1 10 '[]'$  - 0 Medium Wound Dressing one or multiple wounds '[]'$  - 0 Large Wound Dressing one or multiple wounds '[]'$  - 0 Application of Medications - topical '[]'$  - 0 Application of Medications - injection INTERVENTIONS - Miscellaneous '[]'$  - 0 External ear exam '[]'$  - 0 Specimen Collection (cultures, biopsies, blood, body fluids, etc.) '[]'$  - 0 Specimen(s) / Culture(s) sent or taken to Lab for analysis '[]'$  - 0 Patient Transfer (multiple staff / Harrel Lemon Lift / Similar devices) '[]'$  - 0 Simple Staple / Suture removal (25 or less) '[]'$  - 0 Complex Staple / Suture removal (26 or more) '[]'$  - 0 Hypo / Hyperglycemic Management (close monitor of Blood Glucose) '[]'$  - 0 Ankle / Brachial Index (ABI) - do not check if billed separately X- 1 5 Vital Signs Has the patient been seen at the hospital within the last three years: Yes Total Score: 90 Level Of Care: New/Established - Level 3 Electronic Signature(s) Signed: 06/08/2021 4:55:17 PM By: Tyler Hammock RN Entered By: [name] on 06/07/2021 16:39:02 -------------------------------------------------------------------------------- Encounter Discharge Information Details Patient Name: Date of Service: Tyler LL, [name] 06/07/2021 3:15 PM Medical Record Number: [id number] Patient Account Number: [id number] Date of Birth/Sex: Treating RN: 13-Jan-1925 (88 y.o. [name], [name] Primary Care [name]: [name] Other Clinician: Referring  [name]: Treating [name]/Extender: [name] in Treatment: 6 Encounter Discharge Information Items Discharge Condition: Stable Ambulatory Status: Ambulatory Discharge Destination: Home Transportation: Private Auto Accompanied By: Kateri Mc Schedule Follow-up Appointment: Yes Clinical Summary of Care: Patient Declined Electronic Signature(s) Signed: 06/08/2021 4:55:17 PM By: Tyler Hammock RN Entered By: [name] on 06/07/2021 16:40:07 -------------------------------------------------------------------------------- Lower Extremity Assessment Details Patient Name: Date of Service: Tyler LL, [name] 06/07/2021 3:15 PM Medical Record Number: [id number] Patient Account Number: [id number] Date of Birth/Sex: Treating RN: April 19, 1925 (88 y.o. [name], [name] Primary Care Brandyce Dimario: [name] Other Clinician: Referring [name]: Treating Tehillah Cipriani/Extender: [name] in Treatment: 6 Edema Assessment Assessed: Shirlyn Goltz: No] Patrice Paradise: No] Edema: [Left: N] [Right: o] Calf Left: Right: Point of Measurement: 33 cm From Medial Instep 32 cm Ankle Left: Right: Point of Measurement: 8 cm From Medial Instep 21 cm Vascular Assessment Pulses: Dorsalis Pedis Palpable: [Right:Yes] Electronic Signature(s) Signed: 06/08/2021 4:55:17 PM By: Tyler Hammock RN Signed: 06/13/2021 8:46:10 AM By: [name] Entered By: [name] on 06/07/2021 15:59:41 -------------------------------------------------------------------------------- Multi Wound Chart Details Patient Name: Date of Service: Tyler LL, [name] 06/07/2021 3:15 PM Medical Record Number: [id number] Patient Account Number: [id number] Date of Birth/Sex: Treating RN: [birth year] (88 y.o. [name], [name] Primary Care Malacki Mcphearson: [name] Other Clinician: Referring Raider Valbuena: Treating Hymie Gorr/Extender: [name] in Treatment: 6 Vital Signs Height(in): 72 Pulse(bpm): 27 Weight(lbs): 170 Blood Pressure(mmHg): 176/67 Body Mass Index(BMI): 23.1 Temperature(F): 97.8 Respiratory Rate(breaths/min): 18 Photos: [N/A:N/A] Right, Dorsal Foot N/A N/A Wound Location: Gradually Appeared N/A N/A Wounding Event: Atypical N/A N/A Primary Etiology: Coronary Artery Disease, N/A N/A Comorbid History: Hypertension, Osteoarthritis 04/26/2020 N/A N/A Date Acquired: 6 N/A N/A Weeks of Treatment: Open N/A N/A Wound Status: No N/A N/A Wound Recurrence: 2x2.8x0.1 N/A N/A Measurements L x W x  D (cm) 4.398 N/A N/A A (cm) : rea 0.44 N/A N/A Volume (cm) : -1.80% N/A N/A % Reduction in Area: 49.10% N/A N/A % Reduction in Volume: Full Thickness With Exposed Support N/A N/A Classification: Structures Medium N/A N/A Exudate Amount: Purulent N/A N/A Exudate Type: yellow, brown, green N/A N/A Exudate Color: Yes N/A N/A Foul Odor A Cleansing: fter No N/A N/A Odor Anticipated Due to Product Use: Distinct, outline attached N/A N/A Wound Margin: Large (67-100%) N/A N/A Granulation A mount: Red, Pink N/A N/A Granulation Quality: Small (1-33%) N/A N/A Necrotic Amount: Fat Layer (Subcutaneous Tissue): Yes N/A N/A Exposed Structures: Fascia: No Tendon: No Muscle: No Joint: No Bone: No Small (1-33%) N/A N/A Epithelialization: Treatment Notes Electronic Signature(s) Signed: 06/07/2021 5:09:19 PM By: Linton Ham MD Signed: 06/08/2021 4:55:17 PM By: Tyler Hammock RN Entered By: Linton Ham on 06/07/2021 16:30:04 -------------------------------------------------------------------------------- Multi-Disciplinary Care Plan Details Patient Name: Date of Service: Tyler LL, [name] 06/07/2021 3:15 PM Medical Record Number: [id number] Patient Account Number: [id number] Date of Birth/Sex: Treating RN: 01/10/1925 (88 y.o. [name], [name] Primary Care Creston Klas:  [name] Other Clinician: Referring Aitan Rossbach: Treating Shaneca Orne/Extender: [name] in Treatment: 6 Active Inactive Wound/Skin Impairment Nursing Diagnoses: Impaired tissue integrity Knowledge deficit related to ulceration/compromised skin integrity Goals: Patient will have a decrease in wound volume by X% from date: (specify in notes) Date Initiated: 04/26/2021 Target Resolution Date: 07/01/2021 Goal Status: Active Patient/caregiver will verbalize understanding of skin care regimen Date Initiated: 04/26/2021 Target Resolution Date: 07/04/2021 Goal Status: Active Ulcer/skin breakdown will have a volume reduction of 30% by week 4 Date Initiated: 04/26/2021 Target Resolution Date: 07/04/2021 Goal Status: Active Interventions: Assess patient/caregiver ability to obtain necessary supplies Assess patient/caregiver ability to perform ulcer/skin care regimen upon admission and as needed Assess ulceration(s) every visit Notes: Electronic Signature(s) Signed: 06/08/2021 4:55:17 PM By: Tyler Hammock RN Entered By: [name] on 06/07/2021 16:31:21 -------------------------------------------------------------------------------- Pain Assessment Details Patient Name: Date of Service: Tyler LL, [name] 06/07/2021 3:15 PM Medical Record Number: [id number] Patient Account Number: [id number] Date of Birth/Sex: Treating RN: June 26, 1925 (88 y.o. [name], [name] Primary Care Mayara Paulson: [name] Other Clinician: Referring Izabel Chim: Treating Lorraina Spring/Extender: [name] in Treatment: 6 Active Problems Location of Pain Severity and Description of Pain Patient Has Paino No Site Locations Pain Management and Medication Current Pain Management: Electronic Signature(s) Signed: 06/08/2021 4:55:17 PM By: Tyler Hammock RN Signed: 06/13/2021 8:46:10 AM By: [name] Entered By: [name] on  06/07/2021 15:57:40 -------------------------------------------------------------------------------- Patient/Caregiver Education Details Patient Name: Date of Service: Tyler LL, [name] 6/7/2023andnbsp3:15 PM Medical Record Number: [id number] Patient Account Number: [id number] Date of Birth/Gender: Treating RN: June 09, 1925 (88 y.o. [name] Primary Care Physician: [name] Other Clinician: Referring Physician: Treating Physician/Extender: [name] in Treatment: 6 Education Assessment Education Provided To: Patient Education Topics Provided Wound/Skin Impairment: Electronic Signature(s) Signed: 06/08/2021 4:55:17 PM By: Tyler Hammock RN Entered By: [name] on 06/07/2021 16:34:32 -------------------------------------------------------------------------------- Wound Assessment Details Patient Name: Date of Service: Tyler LL, [name] 06/07/2021 3:15 PM Medical Record Number: [id number] Patient Account Number: [id number] Date of Birth/Sex: Treating RN: 12-07-1925 (88 y.o. [name] Primary Care Annamary Buschman: [name] Other Clinician: Referring Jalexa Pifer: Treating Dereona Kolodny/Extender: [name] in Treatment: 6 Wound Status Wound Number: 1 Primary Etiology: Atypical Wound Location: Right, Dorsal Foot Wound Status: Open Wounding Event: Gradually Appeared Comorbid History: Coronary Artery Disease,  Hypertension, Osteoarthritis Date Acquired: 04/26/2020 Weeks Of Treatment: 6 Clustered Wound: No Photos Wound Measurements Length: (cm) 2 Width: (cm) 2.8 Depth: (cm) 0.1 Area: (cm) 4.398 Volume: (cm) 0.44 % Reduction in Area: -1.8% % Reduction in Volume: 49.1% Epithelialization: Small (1-33%) Tunneling: No Undermining: No Wound Description Classification: Full Thickness With Exposed Support Structures Wound Margin: Distinct, outline attached Exudate  Amount: Medium Exudate Type: Purulent Exudate Color: yellow, brown, green Foul Odor After Cleansing: Yes Due to Product Use: No Slough/Fibrino Yes Wound Bed Granulation Amount: Large (67-100%) Exposed Structure Granulation Quality: Red, Pink Fascia Exposed: No Necrotic Amount: Small (1-33%) Fat Layer (Subcutaneous Tissue) Exposed: Yes Necrotic Quality: Adherent Slough Tendon Exposed: No Muscle Exposed: No Joint Exposed: No Bone Exposed: No Treatment Notes Wound #1 (Foot) Wound Laterality: Dorsal, Right Cleanser Soap and Water Discharge Instruction: May shower and wash wound with dial antibacterial soap and water prior to dressing change. Wound Cleanser Discharge Instruction: Cleanse the wound with wound cleanser prior to applying a clean dressing using gauze sponges, not tissue or cotton balls. Peri-Wound Care Zinc Oxide Ointment 30g tube Discharge Instruction: Apply Zinc Oxide to periwound with each dressing change Sween Lotion (Moisturizing lotion) Discharge Instruction: Apply moisturizing lotion as directed Topical Primary Dressing Hydrofera Blue Ready Foam, 2.5 x2.5 in Discharge Instruction: Apply to wound bed as instructed Secondary Dressing ABD Pad, 5x9 Discharge Instruction: Apply over primary dressing as directed. Secured With Compression Wrap Kerlix Roll 4.5x3.1 (in/yd) Discharge Instruction: Apply Kerlix and Coban compression as directed. Coban Self-Adherent Wrap 4x5 (in/yd) Discharge Instruction: Apply over Kerlix as directed. Compression Stockings Add-Ons Electronic Signature(s) Signed: 06/08/2021 4:55:17 PM By: Tyler Hammock RN Signed: 06/13/2021 8:46:10 AM By: [name] Entered By: [name] on 06/07/2021 16:00:14 -------------------------------------------------------------------------------- Vitals Details Patient Name: Date of Service: Tyler LL, [name] 06/07/2021 3:15 PM Medical Record Number: [id number] Patient Account Number:  [id number] Date of Birth/Sex: Treating RN: [birth year] (88 y.o. [name], [name] Primary Care Durand Wittmeyer: [name] Other Clinician: Referring Win Guajardo: Treating Draco Malczewski/Extender: [name] in Treatment: 6 Vital Signs Time Taken: 15:57 Temperature (F): 97.8 Height (in): 72 Pulse (bpm): 69 Weight (lbs): 170 Respiratory Rate (breaths/min): 18 Body Mass Index (BMI): 23.1 Blood Pressure (mmHg): 176/67 Reference Range: 80 - 120 mg / dl Electronic Signature(s) Signed: 06/13/2021 8:46:10 AM By: [name] Entered By: [name] on 06/07/2021 15:57:33

## 2021-06-13 NOTE — Progress Notes (Signed)
ASHDON, GILLSON ([id number]) Visit Report for 05/03/2021 Arrival Information Details Patient Name: Date of Service: HA [name] 05/03/2021 12:00 PM Medical Record Number: [id number] Patient Account Number: [id number] Date of Birth/Sex: Treating RN: 1925-03-28 (88 y.o. [name], [name] Primary Care [name]: [name] Other Clinician: Referring Mylasia Vorhees: Treating [name]/Extender: [name] in Treatment: 1 Visit Information History Since Last Visit Added or deleted any medications: No Patient Arrived: [name] Any new allergies or adverse reactions: No Arrival Time: 12:00 Had a fall or experienced change in No Accompanied By: wife activities of daily living that may affect Transfer Assistance: None risk of falls: Patient Identification Verified: Yes Signs or symptoms of abuse/neglect since last visito No Secondary Verification Process Completed: Yes Hospitalized since last visit: No Patient Requires Transmission-Based Precautions: No Implantable device outside of the clinic excluding No cellular tissue based products placed in the center since last visit: Has Dressing in Place as Prescribed: Yes Pain Present Now: No Electronic Signature(s) Signed: 06/13/2021 8:46:54 AM By: Erenest Blank Entered By: Erenest Blank on 05/03/2021 12:03:25 -------------------------------------------------------------------------------- Clinic Level of Care Assessment Details Patient Name: Date of Service: [name], [name] 05/03/2021 12:00 PM Medical Record Number: [id number] Patient Account Number: [id number] Date of Birth/Sex: Treating RN: [birth year] (88 y.o. [name], [name] Primary Care Gottlieb Zuercher: [name] Other Clinician: Referring [name]: Treating [name]/Extender: [name], [name] in Treatment: 1 Clinic Level of Care Assessment Items TOOL 4 Quantity Score X- 1 0 Use when only an EandM is  performed on FOLLOW-UP visit ASSESSMENTS - Nursing Assessment / Reassessment X- 1 10 Reassessment of Co-morbidities (includes updates in patient status) X- 1 5 Reassessment of Adherence to Treatment Plan ASSESSMENTS - Wound and Skin A ssessment / Reassessment X - Simple Wound Assessment / Reassessment - one wound 1 5 '[]'$  - 0 Complex Wound Assessment / Reassessment - multiple wounds '[]'$  - 0 Dermatologic / Skin Assessment (not related to wound area) ASSESSMENTS - Focused Assessment X- 1 5 Circumferential Edema Measurements - multi extremities '[]'$  - 0 Nutritional Assessment / Counseling / Intervention '[]'$  - 0 Lower Extremity Assessment (monofilament, tuning fork, pulses) '[]'$  - 0 Peripheral Arterial Disease Assessment (using hand held doppler) ASSESSMENTS - Ostomy and/or Continence Assessment and Care '[]'$  - 0 Incontinence Assessment and Management '[]'$  - 0 Ostomy Care Assessment and Management (repouching, etc.) PROCESS - Coordination of Care X - Simple Patient / Family Education for ongoing care 1 15 '[]'$  - 0 Complex (extensive) Patient / Family Education for ongoing care X- 1 10 Staff obtains Programmer, systems, Records, T Results / Process Orders est '[]'$  - 0 Staff telephones HHA, Nursing Homes / Clarify orders / etc '[]'$  - 0 Routine Transfer to another Facility (non-emergent condition) '[]'$  - 0 Routine Hospital Admission (non-emergent condition) '[]'$  - 0 New Admissions / Biomedical engineer / Ordering NPWT Apligraf, etc. , '[]'$  - 0 Emergency Hospital Admission (emergent condition) X- 1 10 Simple Discharge Coordination '[]'$  - 0 Complex (extensive) Discharge Coordination PROCESS - Special Needs '[]'$  - 0 Pediatric / Minor Patient Management '[]'$  - 0 Isolation Patient Management '[]'$  - 0 Hearing / Language / Visual special needs '[]'$  - 0 Assessment of Community assistance (transportation, D/C planning, etc.) '[]'$  - 0 Additional assistance / Altered mentation '[]'$  - 0 Support Surface(s) Assessment  (bed, cushion, seat, etc.) INTERVENTIONS - Wound Cleansing / Measurement X - Simple Wound Cleansing - one wound 1 5 '[]'$  - 0 Complex Wound Cleansing - multiple wounds  X- 1 5 Wound Imaging (photographs - any number of wounds) '[]'$  - 0 Wound Tracing (instead of photographs) X- 1 5 Simple Wound Measurement - one wound '[]'$  - 0 Complex Wound Measurement - multiple wounds INTERVENTIONS - Wound Dressings X - Small Wound Dressing one or multiple wounds 1 10 '[]'$  - 0 Medium Wound Dressing one or multiple wounds '[]'$  - 0 Large Wound Dressing one or multiple wounds X- 1 5 Application of Medications - topical '[]'$  - 0 Application of Medications - injection INTERVENTIONS - Miscellaneous '[]'$  - 0 External ear exam '[]'$  - 0 Specimen Collection (cultures, biopsies, blood, body fluids, etc.) '[]'$  - 0 Specimen(s) / Culture(s) sent or taken to Lab for analysis '[]'$  - 0 Patient Transfer (multiple staff / Civil Service fast streamer / Similar devices) '[]'$  - 0 Simple Staple / Suture removal (25 or less) '[]'$  - 0 Complex Staple / Suture removal (26 or more) '[]'$  - 0 Hypo / Hyperglycemic Management (close monitor of Blood Glucose) '[]'$  - 0 Ankle / Brachial Index (ABI) - do not check if billed separately X- 1 5 Vital Signs Has the patient been seen at the hospital within the last three years: Yes Total Score: 95 Level Of Care: New/Established - Level 3 Electronic Signature(s) Signed: 05/04/2021 4:12:14 PM By: [name] Entered By: Rhae Hammock on 05/03/2021 12:23:34 -------------------------------------------------------------------------------- Encounter Discharge Information Details Patient Name: Date of Service: [name], [name] 05/03/2021 12:00 PM Medical Record Number: [id number] Patient Account Number: [id number] Date of Birth/Sex: Treating RN: 12/03/1925 (88 y.o. [name], [name] Primary Care Janyra Barillas: [name] Other Clinician: Referring Amos Gaber: Treating Morganna Styles/Extender: [name] in Treatment: 1 Encounter Discharge Information Items Discharge Condition: Stable Ambulatory Status: Ambulatory Discharge Destination: Home Transportation: Private Auto Accompanied By: wife Schedule Follow-up Appointment: Yes Clinical Summary of Care: Patient Declined Electronic Signature(s) Signed: 05/04/2021 4:12:14 PM By: [name] Entered By: Rhae Hammock on 05/03/2021 12:24:46 -------------------------------------------------------------------------------- Lower Extremity Assessment Details Patient Name: Date of Service: [name], [name] 05/03/2021 12:00 PM Medical Record Number: [id number] Patient Account Number: [id number] Date of Birth/Sex: Treating RN: 06/13/1925 (88 y.o. [name], [name] Primary Care Thompson Mckim: [name] Other Clinician: Referring Lewis Grivas: Treating Aila Terra/Extender: [name], [name] in Treatment: 1 Edema Assessment Assessed: [Left: No] [Right: No] Edema: [Left: Ye] [Right: s] Calf Left: Right: Point of Measurement: 33 cm From Medial Instep 32.5 cm Ankle Left: Right: Point of Measurement: 8 cm From Medial Instep 21 cm Vascular Assessment Pulses: Dorsalis Pedis Palpable: [Right:Yes] Electronic Signature(s) Signed: 05/04/2021 4:12:14 PM By: [name] Signed: 06/13/2021 8:46:54 AM By: Erenest Blank Entered By: Erenest Blank on 05/03/2021 12:09:48 -------------------------------------------------------------------------------- Multi-Disciplinary Care Plan Details Patient Name: Date of Service: [name], [name] 05/03/2021 12:00 PM Medical Record Number: [id number] Patient Account Number: [id number] Date of Birth/Sex: Treating RN: 06/22/1925 (88 y.o. [name], [name] Primary Care Shakisha Abend: [name] Other Clinician: Referring Jamae Tison: Treating Katai Marsico/Extender: [name], [name] in Treatment: 1 Active  Inactive Wound/Skin Impairment Nursing Diagnoses: Impaired tissue integrity Knowledge deficit related to ulceration/compromised skin integrity Goals: Patient will have a decrease in wound volume by X% from date: (specify in notes) Date Initiated: 04/26/2021 Target Resolution Date: 05/06/2021 Goal Status: Active Patient/caregiver will verbalize understanding of skin care regimen Date Initiated: 04/26/2021 Target Resolution Date: 05/04/2021 Goal Status: Active Ulcer/skin breakdown will have a volume reduction of 30% by week 4 Date Initiated: 04/26/2021 Target Resolution Date: 05/05/2021 Goal  Status: Active Interventions: Assess patient/caregiver ability to obtain necessary supplies Assess patient/caregiver ability to perform ulcer/skin care regimen upon admission and as needed Assess ulceration(s) every visit Notes: Electronic Signature(s) Signed: 05/04/2021 4:12:14 PM By: [name] Entered By: Rhae Hammock on 05/03/2021 12:19:33 -------------------------------------------------------------------------------- Pain Assessment Details Patient Name: Date of Service: [name], [name] 05/03/2021 12:00 PM Medical Record Number: [id number] Patient Account Number: [id number] Date of Birth/Sex: Treating RN: 06/28/1925 (88 y.o. [name], [name] Primary Care Antwion Carpenter: [name] Other Clinician: Referring Asheton Scheffler: Treating Dannell Raczkowski/Extender: [name], [name] in Treatment: 1 Active Problems Location of Pain Severity and Description of Pain Patient Has Paino No Site Locations Pain Management and Medication Current Pain Management: Electronic Signature(s) Signed: 05/04/2021 4:12:14 PM By: [name] Signed: 06/13/2021 8:46:54 AM By: Erenest Blank Entered By: Erenest Blank on 05/03/2021 12:04:04 -------------------------------------------------------------------------------- Patient/Caregiver Education Details Patient  Name: Date of Service: [name], [name] 5/3/2023andnbsp12:00 PM Medical Record Number: [id number] Patient Account Number: [id number] Date of Birth/Gender: Treating RN: 1925-01-06 (88 y.o. Erie Noe Primary Care Physician: [name] Other Clinician: Referring Physician: Treating Physician/Extender: [name] in Treatment: 1 Education Assessment Education Provided To: Patient Education Topics Provided Wound/Skin Impairment: Methods: Explain/Verbal Responses: Reinforcements needed, State content correctly Electronic Signature(s) Signed: 05/04/2021 4:12:14 PM By: [name] Entered By: Rhae Hammock on 05/03/2021 12:19:52 -------------------------------------------------------------------------------- Wound Assessment Details Patient Name: Date of Service: [name], [name] 05/03/2021 12:00 PM Medical Record Number: [id number] Patient Account Number: [id number] Date of Birth/Sex: Treating RN: 1925-02-15 (88 y.o. [name], [name] Primary Care Pryor Guettler: [name] Other Clinician: Referring Phinehas Grounds: Treating Omari Koslosky/Extender: [name], [name] in Treatment: 1 Wound Status Wound Number: 1 Primary Etiology: Atypical Wound Location: Right, Dorsal Foot Wound Status: Open Wounding Event: Gradually Appeared Comorbid History: Coronary Artery Disease, Hypertension, Osteoarthritis Date Acquired: 04/26/2020 Weeks Of Treatment: 1 Clustered Wound: No Wound Measurements Length: (cm) 2 Width: (cm) 2.1 Depth: (cm) 0.2 Area: (cm) 3.299 Volume: (cm) 0.66 % Reduction in Area: 23.6% % Reduction in Volume: 23.6% Epithelialization: Small (1-33%) Tunneling: No Undermining: No Wound Description Classification: Full Thickness With Exposed Support Structures Wound Margin: Distinct, outline attached Exudate Amount: Medium Exudate Type: Serosanguineous Exudate Color: red,  brown Foul Odor After Cleansing: No Slough/Fibrino Yes Wound Bed Granulation Amount: Medium (34-66%) Exposed Structure Granulation Quality: Red, Pink Fascia Exposed: No Necrotic Amount: Medium (34-66%) Fat Layer (Subcutaneous Tissue) Exposed: Yes Necrotic Quality: Adherent Slough Tendon Exposed: No Muscle Exposed: No Joint Exposed: No Bone Exposed: No Electronic Signature(s) Signed: 05/04/2021 4:12:14 PM By: [name] Signed: 06/13/2021 8:46:54 AM By: Erenest Blank Entered By: Erenest Blank on 05/03/2021 12:10:22 -------------------------------------------------------------------------------- Wound Assessment Details Patient Name: Date of Service: [name], [name] 05/03/2021 12:00 PM Medical Record Number: [id number] Patient Account Number: [id number] Date of Birth/Sex: Treating RN: Dec 09, 1925 (88 y.o. [name], [name] Primary Care Quashon Jesus: [name] Other Clinician: Referring Tsuyako Jolley: Treating Apryll Hinkle/Extender: [name], [name] in Treatment: 1 Wound Status Wound Number: 1 Primary Etiology: Atypical Wound Location: Right, Dorsal Foot Wound Status: Open Wounding Event: Gradually Appeared Comorbid History: Coronary Artery Disease, Hypertension, Osteoarthritis Date Acquired: 04/26/2020 Weeks Of Treatment: 1 Clustered Wound: No Photos Wound Measurements Length: (cm) 2 Width: (cm) 2.1 Depth: (cm) 0.2 Area: (cm) 3.299 Volume: (cm) 0.66 % Reduction in Area: 23.6% % Reduction in Volume: 23.6% Wound Description Classification: Full Thickness With Exposed Support Structure Exudate Amount:  Medium Exudate Type: Serosanguineous Exudate Color: red, brown s Electronic Signature(s) Signed: 05/04/2021 4:12:14 PM By: [name] Entered By: Rhae Hammock on 05/03/2021 12:11:04 -------------------------------------------------------------------------------- Vitals Details Patient Name: Date of Service: HA  LL, [name] 05/03/2021 12:00 PM Medical Record Number: [id number] Patient Account Number: [id number] Date of Birth/Sex: Treating RN: 1925-06-04 (88 y.o. [name], [name] Primary Care Asar Evilsizer: [name] Other Clinician: Referring Raeli Wiens: Treating Haydn Cush/Extender: [name], [name] in Treatment: 1 Vital Signs Time Taken: 12:03 Temperature (F): 97.6 Height (in): 72 Pulse (bpm): 65 Weight (lbs): 170 Respiratory Rate (breaths/min): 18 Body Mass Index (BMI): 23.1 Blood Pressure (mmHg): 165/70 Reference Range: 80 - 120 mg / dl Electronic Signature(s) Signed: 06/13/2021 8:46:54 AM By: Erenest Blank Entered By: Erenest Blank on 05/03/2021 12:03:53

## 2021-06-14 ENCOUNTER — Other Ambulatory Visit: Payer: Self-pay

## 2021-06-14 ENCOUNTER — Encounter (HOSPITAL_BASED_OUTPATIENT_CLINIC_OR_DEPARTMENT_OTHER): Payer: PPO | Admitting: Physician Assistant

## 2021-06-14 DIAGNOSIS — I10 Essential (primary) hypertension: Secondary | ICD-10-CM | POA: Diagnosis not present

## 2021-06-14 DIAGNOSIS — D500 Iron deficiency anemia secondary to blood loss (chronic): Secondary | ICD-10-CM | POA: Diagnosis not present

## 2021-06-14 DIAGNOSIS — N1832 Chronic kidney disease, stage 3b: Secondary | ICD-10-CM | POA: Diagnosis not present

## 2021-06-14 DIAGNOSIS — L97512 Non-pressure chronic ulcer of other part of right foot with fat layer exposed: Secondary | ICD-10-CM | POA: Diagnosis not present

## 2021-06-14 DIAGNOSIS — R7303 Prediabetes: Secondary | ICD-10-CM | POA: Diagnosis not present

## 2021-06-14 DIAGNOSIS — I700 Atherosclerosis of aorta: Secondary | ICD-10-CM | POA: Diagnosis not present

## 2021-06-14 DIAGNOSIS — I2510 Atherosclerotic heart disease of native coronary artery without angina pectoris: Secondary | ICD-10-CM | POA: Diagnosis not present

## 2021-06-14 DIAGNOSIS — C44792 Other specified malignant neoplasm of skin of right lower limb, including hip: Secondary | ICD-10-CM | POA: Diagnosis not present

## 2021-06-14 DIAGNOSIS — R809 Proteinuria, unspecified: Secondary | ICD-10-CM | POA: Diagnosis not present

## 2021-06-14 DIAGNOSIS — I129 Hypertensive chronic kidney disease with stage 1 through stage 4 chronic kidney disease, or unspecified chronic kidney disease: Secondary | ICD-10-CM | POA: Diagnosis not present

## 2021-06-14 DIAGNOSIS — E782 Mixed hyperlipidemia: Secondary | ICD-10-CM | POA: Diagnosis not present

## 2021-06-14 NOTE — Progress Notes (Addendum)
[name], [name] ([id number]) Visit Report for 06/14/2021 Biopsy Details Patient Name: Date of Service: [name], [name] 06/14/2021 1:45 PM Medical Record Number: [id number] Patient Account Number: [id number] Date of Birth/Sex: Treating RN: February 25, 1925 (88 y.o. Erie Noe Primary Care Provider: [name] Other Clinician: Referring Provider: Treating Provider/Extender: [name], [name] in Treatment: 7 Biopsy Performed for: Wound #1 Right, Dorsal Foot Location(s): Wound Bed Performed By: Physician [name], PA Tissue Punch: Yes Size (mm): 3 Number of Specimens T aken: 1 Specimen Sent T Pathology: o Yes Level of Consciousness (Pre-procedure): Awake and Alert Pre-procedure Verification/Time-Out Taken: Yes - 14:55 Pain Control: Lidocaine Injectable Lidocaine Percent: 1% Instrument: Forceps, Scissors Bleeding: Minimum Hemostasis Achieved: Pressure Procedural Pain: 0 Post Procedural Pain: 0 Response to Treatment: Procedure was tolerated well Level of Consciousness (Post-procedure): Awake and Alert Post Procedure Diagnosis Same as Pre-procedure Electronic Signature(s) Signed: 06/14/2021 5:11:12 PM By: [name] PA-C Signed: 06/15/2021 5:29:07 PM By: [name] Entered By: Rhae Hammock on 06/14/2021 15:02:23 -------------------------------------------------------------------------------- Chief Complaint Document Details Patient Name: Date of Service: [name], [name] 06/14/2021 1:45 PM Medical Record Number: [id number] Patient Account Number: [id number] Date of Birth/Sex: Treating RN: 22-Oct-1925 (88 y.o. [name], [name] Primary Care Provider: [name] Other Clinician: Referring Provider: Treating Provider/Extender: [name], [name] in Treatment: 7 Information Obtained from: Patient Chief Complaint Right dorsal foot ulcer Electronic Signature(s) Signed:  06/14/2021 1:45:14 PM By: [name] PA-C Entered By: [name] on 06/14/2021 13:45:14 -------------------------------------------------------------------------------- HPI Details Patient Name: Date of Service: [name], [name] 06/14/2021 1:45 PM Medical Record Number: [id number] Patient Account Number: [id number] Date of Birth/Sex: Treating RN: 09/24/1925 (88 y.o. [name], [name] Primary Care Provider: [name] Other Clinician: Referring Provider: Treating Provider/Extender: [name] in Treatment: 7 History of Present Illness HPI Description: 04-26-2021 upon evaluation today patient presents for initial inspection here in the clinic has been having an issue with a dorsal foot wound on the right foot for the past year. He tells me at this time that he does not even really know what brought this on. Fortunately he does not seem to have any signs of infection here and has been given some acting medication which was benzyl peroxide forward from dermatology that really has not helped. He has not had anything really to put on it from a dressing standpoint other than just utilizing some Neosporin over-the-counter for the most part. Nonetheless he figured he would come get this checked out since have seen his wife before and we have been able to get her healed he is hoping we can do the same here. Patient does have a history of hypertension, chronic venous insufficiency though he does wear compression socks that he does not appear to be swollen at this time. He also has a history of coronary artery disease. 05-03-2021 upon evaluation today patient appears to be doing well with regard to the wound on the top of his foot this is actually showing signs of excellent improvement which is great news and overall I am extremely pleased with where we stand today. There does not appear to be any evidence of active infection locally or systemically which  is great news. 05-10-2021 upon evaluation today patient appears to be doing about the same in regard to his wound. Unfortunately I do still think that we are not making significant progress here. I believe that  the patient probably needs something a little bit better to manage this effectively. I really believe the compression sock would not be optimal in this case as it is continuing to pull the dressing off. For that reason I think the best bet is probably going to be for [place] to look into compression wrapping. We have avoided this due to the fact that we did have ABIs and the patient is somewhat leery to want to go down that road he tells me that at his age, 30, he is really not interested in a lot of evaluation and treatment with regard to his blood flow. I completely understand that but this very well may be part of our issue here. Nonetheless he is wanting to opt for a lighter compression wrap to see if that could be beneficial and if we get this healed without a lot of other testing and so long. 05-17-2021 upon evaluation today patient appears to be doing well with regard to his wound although were not significantly smaller here. Fortunately I do not see any evidence of active infection locally or systemically which is great news and overall I am pleased in that regard. No fevers, chills, nausea, vomiting, or diarrhea. Again we have discussed the possibility of a biopsy if things were not getting better. The wound is slightly better the leg is significantly smaller. With that being said he had a lot of drainage over the past week which again does affect things to some degree as well for that reason I am probably hold off on a biopsy for 1 more week if we do a biopsy and will need to be a fairly superficial punch as I do not want to do anything too deep in case blood flow is an issue here. He has really declined going for any significant arterial studies and therefore I cannot be certain that his  blood flow is sufficient to sustain a aggressive intervention in that regard. Nonetheless we may need to know for sure whether this is any significant evidence of a cancerous lesion or otherwise. 05-24-2021 upon evaluation today patient's wound really is not showing significant improvement at all unfortunately. I do believe at this time that he would be a candidate for a couple things were going to try to switch up his dressings to the degree and had an antibiotic, also can obtain a culture and subsequently I am going to continue to suggest that if things or not better by next week that we should probably do a culture. In fact that is my plan unless he is significantly smaller come next week. The patient voiced understanding. 05-31-2021 upon evaluation today patient's wound is looking a little better but still about the same size. I did get his results back from the culture it did show that he had evidence of bacterial infection noted at this point. This included 2 bacteria neither which are actually going to be managed with the doxycycline. For that reason I Georgina Peer recommend that he stop that and we will get a place him on cefdinir. He was positive for both Enterobacter as well as Proteus. Both of which were sensitive to the cefdinir. 6/7; wound on the dorsal right first metatarsal head of undetermined etiology that has been present for about a year. He is on antibiotics for 2 weeks cefdinir and tolerating this well. Using [facility] on the wound 06-14-2021 upon evaluation today patient appears to be doing really a little worse in regard to the overall appearance of  his wound. Fortunately there does not appear to be any signs of infection which is great news but unfortunately I do think that we are still not seeing any improvement despite everything we have tried up to this point. I do believe the [facility] topical antibiotics could be beneficial I Georgina Peer go ahead and order that for him today we will  get that sent in and that will be shipped to his home. With that being said I also think it is probably time for [place] to go ahead and do a biopsy. Electronic Signature(s) Signed: 06/14/2021 3:08:13 PM By: [name] PA-C Entered By: [name] on 06/14/2021 15:08:13 -------------------------------------------------------------------------------- Physical Exam Details Patient Name: Date of Service: [name], [name] 06/14/2021 1:45 PM Medical Record Number: [id number] Patient Account Number: [id number] Date of Birth/Sex: Treating RN: [birth year] (88 y.o. Erie Noe Primary Care Provider: Other Clinician: [name] Referring Provider: Treating Provider/Extender: [name], [name] in Treatment: 7 Constitutional Well-nourished and well-hydrated in no acute distress. Respiratory normal breathing without difficulty. Psychiatric this patient is able to make decisions and demonstrates good insight into disease process. Alert and Oriented x 3. pleasant and cooperative. Notes Upon inspection patient's wound bed actually showed signs of poor granulation and epithelization at this point. I really think that we need to try to do what we can to identify if there is anything abnormal going on as far as a skin cancer is concerned. If there is not then the next step may be to revisit the possibility of checking his blood flow. Electronic Signature(s) Signed: 06/14/2021 3:08:39 PM By: [name] PA-C Entered By: [name] on 06/14/2021 15:08:39 -------------------------------------------------------------------------------- Physician Orders Details Patient Name: Date of Service: [name], [name] 06/14/2021 1:45 PM Medical Record Number: [id number] Patient Account Number: [id number] Date of Birth/Sex: Treating RN: November 12, 1925 (88 y.o. [name], [name] Primary Care Provider: [name] Other Clinician: Referring  Provider: Treating Provider/Extender: [name] in Treatment: 7 Verbal / Phone Orders: No Diagnosis Coding ICD-10 Coding Code Description L98.8 Other specified disorders of the skin and subcutaneous tissue L97.512 Non-pressure chronic ulcer of other part of right foot with fat layer exposed I25.10 Atherosclerotic heart disease of native coronary artery without angina pectoris I10 Essential (primary) hypertension I87.2 Venous insufficiency (chronic) (peripheral) Follow-up Appointments ppointment in 1 week. - 06/21/21 @ 9150!!! Jeri Cos, [place] and [place] # 9 Return A Bathing/ Shower/ Hygiene May shower with protection but do not get wound dressing(s) wet. - You may shower using a cast protector over top of your wrap!!! Edema Control - Lymphedema / SCD / Other Elevate legs to the level of the heart or above for 30 minutes daily and/or when sitting, a frequency of: - 3-4 times a day throughout the day. Avoid standing for long periods of time. Patient to wear own compression stockings every day. Wound Treatment Wound #1 - Foot Wound Laterality: Dorsal, Right Cleanser: Soap and Water 2 x Per VWP/79 Days Discharge Instructions: May shower and wash wound with dial antibacterial soap and water prior to dressing change. Cleanser: Wound Cleanser (DME) (Generic) 2 x Per Day/15 Days Discharge Instructions: Cleanse the wound with wound cleanser prior to applying a clean dressing using gauze sponges, not tissue or cotton balls. Peri-Wound Care: Zinc Oxide Ointment 30g tube 2 x Per Day/15 Days Discharge Instructions: Apply Zinc Oxide to periwound with each dressing change Peri-Wound Care: Sween Lotion (Moisturizing lotion) 2  x Per Day/15 Days Discharge Instructions: Apply moisturizing lotion as directed Prim Dressing: Hydrofera Blue Ready Foam, 2.5 x2.5 in (DME) (Generic) 2 x Per Day/15 Days ary Discharge Instructions: Apply to wound bed as instructed Secondary  Dressing: Woven Gauze Sponge, Non-Sterile 4x4 in (DME) (Generic) 2 x Per Day/15 Days Discharge Instructions: Apply over primary dressing as directed. Secondary Dressing: Zetuvit Plus Silicone Border Dressing 4x4 (in/in) (DME) (Generic) 2 x Per Day/15 Days Discharge Instructions: Apply silicone border over primary dressing as directed. Secondary Dressing: keystone 2 x Per PJA/25 Days Discharge Instructions: Apply [employer] Antibiotic Ointment once you get it in the mail. Laboratory Bacteria identified in Tissue by Biopsy culture (MICRO) LOINC Code: [id number] Convenience Name: Biopsy specimen culture Electronic Signature(s) Signed: 06/14/2021 5:11:12 PM By: [name] PA-C Signed: 06/15/2021 5:29:07 PM By: [name] Entered By: Rhae Hammock on 06/14/2021 15:11:44 -------------------------------------------------------------------------------- Problem List Details Patient Name: Date of Service: [name], [name] 06/14/2021 1:45 PM Medical Record Number: [id number] Patient Account Number: [id number] Date of Birth/Sex: Treating RN: 11/10/1925 (88 y.o. [name], [name] Primary Care Provider: [name] Other Clinician: Referring Provider: Treating Provider/Extender: [name] in Treatment: 7 Active Problems ICD-10 Encounter Code Description Active Date MDM Diagnosis L98.8 Other specified disorders of the skin and subcutaneous tissue 04/26/2021 No Yes L97.512 Non-pressure chronic ulcer of other part of right foot with fat layer exposed 04/26/2021 No Yes I25.10 Atherosclerotic heart disease of native coronary artery without angina pectoris 04/26/2021 No Yes I10 Essential (primary) hypertension 04/26/2021 No Yes I87.2 Venous insufficiency (chronic) (peripheral) 04/26/2021 No Yes Inactive Problems Resolved Problems Electronic Signature(s) Signed: 06/14/2021 1:45:08 PM By: [name] PA-C Entered By: [name] on  06/14/2021 13:45:07 -------------------------------------------------------------------------------- Progress Note Details Patient Name: Date of Service: [name], [name] 06/14/2021 1:45 PM Medical Record Number: [id number] Patient Account Number: [id number] Date of Birth/Sex: Treating RN: [birth year] (88 y.o. [name], [name] Primary Care Provider: [name] Other Clinician: Referring Provider: Treating Provider/Extender: [name] in Treatment: 7 Subjective Chief Complaint Information obtained from Patient Right dorsal foot ulcer History of Present Illness (HPI) 04-26-2021 upon evaluation today patient presents for initial inspection here in the clinic has been having an issue with a dorsal foot wound on the right foot for the past year. He tells me at this time that he does not even really know what brought this on. Fortunately he does not seem to have any signs of infection here and has been given some acting medication which was benzyl peroxide forward from dermatology that really has not helped. He has not had anything really to put on it from a dressing standpoint other than just utilizing some Neosporin over-the-counter for the most part. Nonetheless he figured he would come get this checked out since have seen his wife before and we have been able to get her healed he is hoping we can do the same here. Patient does have a history of hypertension, chronic venous insufficiency though he does wear compression socks that he does not appear to be swollen at this time. He also has a history of coronary artery disease. 05-03-2021 upon evaluation today patient appears to be doing well with regard to the wound on the top of his foot this is actually showing signs of excellent improvement which is great news and overall I am extremely pleased with where we stand today. There does not appear to be any evidence of  active infection locally or  systemically which is great news. 05-10-2021 upon evaluation today patient appears to be doing about the same in regard to his wound. Unfortunately I do still think that we are not making significant progress here. I believe that the patient probably needs something a little bit better to manage this effectively. I really believe the compression sock would not be optimal in this case as it is continuing to pull the dressing off. For that reason I think the best bet is probably going to be for [place] to look into compression wrapping. We have avoided this due to the fact that we did have ABIs and the patient is somewhat leery to want to go down that road he tells me that at his age, 65, he is really not interested in a lot of evaluation and treatment with regard to his blood flow. I completely understand that but this very well may be part of our issue here. Nonetheless he is wanting to opt for a lighter compression wrap to see if that could be beneficial and if we get this healed without a lot of other testing and so long. 05-17-2021 upon evaluation today patient appears to be doing well with regard to his wound although were not significantly smaller here. Fortunately I do not see any evidence of active infection locally or systemically which is great news and overall I am pleased in that regard. No fevers, chills, nausea, vomiting, or diarrhea. Again we have discussed the possibility of a biopsy if things were not getting better. The wound is slightly better the leg is significantly smaller. With that being said he had a lot of drainage over the past week which again does affect things to some degree as well for that reason I am probably hold off on a biopsy for 1 more week if we do a biopsy and will need to be a fairly superficial punch as I do not want to do anything too deep in case blood flow is an issue here. He has really declined going for any significant arterial studies and therefore I cannot be  certain that his blood flow is sufficient to sustain a aggressive intervention in that regard. Nonetheless we may need to know for sure whether this is any significant evidence of a cancerous lesion or otherwise. 05-24-2021 upon evaluation today patient's wound really is not showing significant improvement at all unfortunately. I do believe at this time that he would be a candidate for a couple things were going to try to switch up his dressings to the degree and had an antibiotic, also can obtain a culture and subsequently I am going to continue to suggest that if things or not better by next week that we should probably do a culture. In fact that is my plan unless he is significantly smaller come next week. The patient voiced understanding. 05-31-2021 upon evaluation today patient's wound is looking a little better but still about the same size. I did get his results back from the culture it did show that he had evidence of bacterial infection noted at this point. This included 2 bacteria neither which are actually going to be managed with the doxycycline. For that reason I Georgina Peer recommend that he stop that and we will get a place him on cefdinir. He was positive for both Enterobacter as well as Proteus. Both of which were sensitive to the cefdinir. 6/7; wound on the dorsal right first metatarsal head of undetermined etiology that has been  present for about a year. He is on antibiotics for 2 weeks cefdinir and tolerating this well. Using [facility] on the wound 06-14-2021 upon evaluation today patient appears to be doing really a little worse in regard to the overall appearance of his wound. Fortunately there does not appear to be any signs of infection which is great news but unfortunately I do think that we are still not seeing any improvement despite everything we have tried up to this point. I do believe the [facility] topical antibiotics could be beneficial I Georgina Peer go ahead and order that for him  today we will get that sent in and that will be shipped to his home. With that being said I also think it is probably time for [place] to go ahead and do a biopsy. Objective Constitutional Well-nourished and well-hydrated in no acute distress. Vitals Time Taken: 1:58 PM, Height: 72 in, Weight: 170 lbs, BMI: 23.1, Temperature: 97.9 F, Pulse: 71 bpm, Respiratory Rate: 19 breaths/min, Blood Pressure: 156/67 mmHg. Respiratory normal breathing without difficulty. Psychiatric this patient is able to make decisions and demonstrates good insight into disease process. Alert and Oriented x 3. pleasant and cooperative. General Notes: Upon inspection patient's wound bed actually showed signs of poor granulation and epithelization at this point. I really think that we need to try to do what we can to identify if there is anything abnormal going on as far as a skin cancer is concerned. If there is not then the next step may be to revisit the possibility of checking his blood flow. Integumentary (Hair, Skin) Wound #1 status is Open. Original cause of wound was Gradually Appeared. The date acquired was: 04/26/2020. The wound has been in treatment 7 weeks. The wound is located on the Right,Dorsal Foot. The wound measures 2.5cm length x 2.9cm width x 0.1cm depth; 5.694cm^2 area and 0.569cm^3 volume. There is Fat Layer (Subcutaneous Tissue) exposed. There is no tunneling or undermining noted. There is a medium amount of purulent drainage noted. Foul odor after cleansing was noted. The wound margin is distinct with the outline attached to the wound base. There is large (67-100%) red, pink granulation within the wound bed. There is a small (1-33%) amount of necrotic tissue within the wound bed including Adherent Slough. Assessment Active Problems ICD-10 Other specified disorders of the skin and subcutaneous tissue Non-pressure chronic ulcer of other part of right foot with fat layer exposed Atherosclerotic heart  disease of native coronary artery without angina pectoris Essential (primary) hypertension Venous insufficiency (chronic) (peripheral) Procedures Wound #1 Pre-procedure diagnosis of Wound #1 is an Atypical located on the Right, Dorsal Foot . There was a biopsy performed by [name], PA. There was a biopsy performed on Wound Bed. The skin was cleansed and prepped with anti-septic followed by pain control using Lidocaine Injectable: 1%. Utilizing a 3 mm tissue punch, tissue was removed at its base with the following instrument(s): Forceps and Scissors and sent to pathology. A Minimum amount of bleeding was controlled with Pressure. A time out was conducted at 14:55, prior to the start of the procedure. The procedure was tolerated well with a pain level of 0 throughout and a pain level of 0 following the procedure. Post procedure Diagnosis Wound #1: Same as Pre-Procedure Plan 1. I would recommend that we hold off and see what the biopsy shows before making any judgment calls on where to go next from a blood flow perspective. The patient is in agreement with that plan. 2. I  am not a switch to having him change this at home. If straining so much I feel like he may need to change it at least every other day if not daily. He tells me that he is fine with doing this regard and continue with the [facility]. 3. Muscle can order the [facility] topical antibiotics for him which we will get a go ahead and send into the pharmacy is sending get that started the better. We will see patient back for reevaluation in 1 week here in the clinic. If anything worsens or changes patient will contact our office for additional recommendations. Electronic Signature(s) Signed: 06/14/2021 3:09:14 PM By: [name] PA-C Entered By: [name] on 06/14/2021 15:09:13 -------------------------------------------------------------------------------- SuperBill Details Patient Name: Date of Service: [name],  [name] 06/14/2021 Medical Record Number: [id number] Patient Account Number: [id number] Date of Birth/Sex: Treating RN: 31-Oct-1925 (88 y.o. [name], [name] Primary Care Provider: [name] Other Clinician: Referring Provider: Treating Provider/Extender: [name], [name] in Treatment: 7 Diagnosis Coding ICD-10 Codes Code Description L98.8 Other specified disorders of the skin and subcutaneous tissue L97.512 Non-pressure chronic ulcer of other part of right foot with fat layer exposed I25.10 Atherosclerotic heart disease of native coronary artery without angina pectoris I10 Essential (primary) hypertension I87.2 Venous insufficiency (chronic) (peripheral) Facility Procedures CPT4 Code: [id number] Description: 11104-Punch biopsy of skin (including simple closure, when performed) single lesion ICD-10 Diagnosis Description L97.512 Non-pressure chronic ulcer of other part of right foot with fat layer exposed Modifier: Quantity: 1 Physician Procedures : CPT4 Code Description Modifier [id number] - WC PHYS LEVEL 4 - EST PT 25 ICD-10 Diagnosis Description L98.8 Other specified disorders of the skin and subcutaneous tissue L97.512 Non-pressure chronic ulcer of other part of right foot with fat layer  exposed I25.10 Atherosclerotic heart disease of native coronary artery without angina pectoris I10 Essential (primary) hypertension Quantity: 1 : 11104 Punch biopsy of skin (including simple closure, when performed) single lesion ICD-10 Diagnosis Description L97.512 Non-pressure chronic ulcer of other part of right foot with fat layer exposed Quantity: 1 Electronic Signature(s) Signed: 06/15/2021 10:33:34 AM By: [name] PA-C Signed: 06/15/2021 5:29:07 PM By: [name] Previous Signature: 06/14/2021 3:10:12 PM Version By: [name] PA-C Entered By: Rhae Hammock on 06/15/2021 08:38:46

## 2021-06-15 DIAGNOSIS — L97512 Non-pressure chronic ulcer of other part of right foot with fat layer exposed: Secondary | ICD-10-CM | POA: Diagnosis not present

## 2021-06-16 NOTE — Progress Notes (Signed)
NICKI, GRACY ([id number]) Visit Report for 06/14/2021 Arrival Information Details Patient Name: Date of Service: HA Winfred Leeds YBO RNE B. 06/14/2021 1:45 PM Medical Record Number: [id number] Patient Account Number: [id number] Date of Birth/Sex: Treating RN: [birth year] (88 y.o. [name], [name] Primary Care Daysi Boggan: [name] Other Clinician: Referring Alizee Maple: Treating Kimia Finan/Extender: [name] in Treatment: 7 Visit Information History Since Last Visit Added or deleted any medications: No Patient Arrived: [name] Any new allergies or adverse reactions: No Arrival Time: 13:55 Had a fall or experienced change in No Accompanied By: wife activities of daily living that may affect Transfer Assistance: None risk of falls: Patient Identification Verified: Yes Signs or symptoms of abuse/neglect since last visito No Secondary Verification Process Completed: Yes Hospitalized since last visit: No Patient Requires Transmission-Based Precautions: No Implantable device outside of the clinic excluding No cellular tissue based products placed in the center since last visit: Has Dressing in Place as Prescribed: Yes Pain Present Now: No Electronic Signature(s) Signed: 06/16/2021 11:45:44 AM By: Erenest Blank Entered By: Erenest Blank on 06/14/2021 13:56:44 -------------------------------------------------------------------------------- Encounter Discharge Information Details Patient Name: Date of Service: [name], [name] 06/14/2021 1:45 PM Medical Record Number: [id number] Patient Account Number: [id number] Date of Birth/Sex: Treating RN: [birth year] (88 y.o. [name], [name] Primary Care Lyndel Dancel: [name] Other Clinician: Referring Linsi Humann: Treating Marijah Larranaga/Extender: [name] in Treatment: 7 Encounter Discharge Information Items Post Procedure Vitals Discharge Condition:  Stable Temperature (F): 98.7 Ambulatory Status: Ambulatory Pulse (bpm): 74 Discharge Destination: Home Respiratory Rate (breaths/min): 17 Transportation: Private Auto Blood Pressure (mmHg): 134/74 Accompanied By: wife Schedule Follow-up Appointment: Yes Clinical Summary of Care: Patient Declined Electronic Signature(s) Signed: 06/15/2021 5:29:07 PM By: [name] Entered By: Rhae Hammock on 06/15/2021 08:39:19 -------------------------------------------------------------------------------- Lower Extremity Assessment Details Patient Name: Date of Service: [name], [name] 06/14/2021 1:45 PM Medical Record Number: [id number] Patient Account Number: [id number] Date of Birth/Sex: Treating RN: 07-07-1925 (88 y.o. [name], [name] Primary Care Karee Christopherson: [name] Other Clinician: Referring Desare Duddy: Treating Horris Speros/Extender: [name], [name] in Treatment: 7 Edema Assessment Assessed: [Left: No] [Right: No] Edema: [Left: N] [Right: o] Calf Left: Right: Point of Measurement: 33 cm From Medial Instep 32.1 cm Ankle Left: Right: Point of Measurement: 8 cm From Medial Instep 20.4 cm Vascular Assessment Pulses: Dorsalis Pedis Palpable: [Right:Yes] Electronic Signature(s) Signed: 06/15/2021 5:29:07 PM By: [name] Signed: 06/16/2021 11:45:44 AM By: Erenest Blank Entered By: Erenest Blank on 06/14/2021 14:04:44 -------------------------------------------------------------------------------- Multi-Disciplinary Care Plan Details Patient Name: Date of Service: [name], [name] 06/14/2021 1:45 PM Medical Record Number: [id number] Patient Account Number: [id number] Date of Birth/Sex: Treating RN: [birth year] (88 y.o. [name], [name] Primary Care Macy Polio: [name] Other Clinician: Referring Manal Kreutzer: Treating Rhoderick Farrel/Extender: [name], [name] in Treatment: 7 Active  Inactive Wound/Skin Impairment Nursing Diagnoses: Impaired tissue integrity Knowledge deficit related to ulceration/compromised skin integrity Goals: Patient will have a decrease in wound volume by X% from date: (specify in notes) Date Initiated: 04/26/2021 Target Resolution Date: 07/01/2021 Goal Status: Active Patient/caregiver will verbalize understanding of skin care regimen Date Initiated: 04/26/2021 Target Resolution Date: 07/04/2021 Goal Status: Active Ulcer/skin breakdown will have a volume reduction of 30% by week 4 Date Initiated: 04/26/2021 Target Resolution Date: 07/04/2021 Goal Status: Active Interventions: Assess patient/caregiver ability to obtain necessary supplies Assess patient/caregiver ability to perform ulcer/skin care regimen upon admission and as needed Assess ulceration(s)  every visit Notes: Electronic Signature(s) Signed: 06/15/2021 5:29:07 PM By: [name] Entered By: Rhae Hammock on 06/15/2021 08:38:13 -------------------------------------------------------------------------------- Pain Assessment Details Patient Name: Date of Service: [name], [name] 06/14/2021 1:45 PM Medical Record Number: [id number] Patient Account Number: [id number] Date of Birth/Sex: Treating RN: [birth year] (88 y.o. [name], [name] Primary Care Kameria Canizares: [name] Other Clinician: Referring Janara Klett: Treating Marcele Kosta/Extender: [name], [name] in Treatment: 7 Active Problems Location of Pain Severity and Description of Pain Patient Has Paino No Site Locations Pain Management and Medication Current Pain Management: Electronic Signature(s) Signed: 06/15/2021 5:29:07 PM By: [name] Signed: 06/16/2021 11:45:44 AM By: Erenest Blank Entered By: Erenest Blank on 06/14/2021 13:59:13 -------------------------------------------------------------------------------- Patient/Caregiver Education Details Patient  Name: Date of Service: [name], [name] 6/14/2023andnbsp1:45 PM Medical Record Number: [id number] Patient Account Number: [id number] Date of Birth/Gender: Treating RN: [birth year] (88 y.o. Erie Noe Primary Care Physician: [name] Other Clinician: Referring Physician: Treating Physician/Extender: [name] in Treatment: 7 Education Assessment Education Provided To: Patient Education Topics Provided Basic Hygiene: Methods: Explain/Verbal Responses: State content correctly Electronic Signature(s) Signed: 06/15/2021 5:29:07 PM By: [name] Entered By: Rhae Hammock on 06/15/2021 08:38:31 -------------------------------------------------------------------------------- Wound Assessment Details Patient Name: Date of Service: [name], [name] 06/14/2021 1:45 PM Medical Record Number: [id number] Patient Account Number: [id number] Date of Birth/Sex: Treating RN: 09-23-1925 (88 y.o. [name], [name] Primary Care Camy Leder: [name] Other Clinician: Referring Greer Wainright: Treating Panda Crossin/Extender: [name], [name] in Treatment: 7 Wound Status Wound Number: 1 Primary Etiology: Atypical Wound Location: Right, Dorsal Foot Wound Status: Open Wounding Event: Gradually Appeared Comorbid History: Coronary Artery Disease, Hypertension, Osteoarthritis Date Acquired: 04/26/2020 Weeks Of Treatment: 7 Clustered Wound: No Photos Wound Measurements Length: (cm) 2.5 Width: (cm) 2.9 Depth: (cm) 0.1 Area: (cm) 5.694 Volume: (cm) 0.569 % Reduction in Area: -31.8% % Reduction in Volume: 34.1% Epithelialization: Small (1-33%) Tunneling: No Undermining: No Wound Description Classification: Full Thickness With Exposed Support Structures Wound Margin: Distinct, outline attached Exudate Amount: Medium Exudate Type: Purulent Exudate Color: yellow, brown, green Foul Odor After  Cleansing: Yes Due to Product Use: No Slough/Fibrino Yes Wound Bed Granulation Amount: Large (67-100%) Exposed Structure Granulation Quality: Red, Pink Fascia Exposed: No Necrotic Amount: Small (1-33%) Fat Layer (Subcutaneous Tissue) Exposed: Yes Necrotic Quality: Adherent Slough Tendon Exposed: No Muscle Exposed: No Joint Exposed: No Bone Exposed: No Treatment Notes Wound #1 (Foot) Wound Laterality: Dorsal, Right Cleanser Soap and Water Discharge Instruction: May shower and wash wound with dial antibacterial soap and water prior to dressing change. Wound Cleanser Discharge Instruction: Cleanse the wound with wound cleanser prior to applying a clean dressing using gauze sponges, not tissue or cotton balls. Peri-Wound Care Zinc Oxide Ointment 30g tube Discharge Instruction: Apply Zinc Oxide to periwound with each dressing change Sween Lotion (Moisturizing lotion) Discharge Instruction: Apply moisturizing lotion as directed Topical Primary Dressing Hydrofera Blue Ready Foam, 2.5 x2.5 in Discharge Instruction: Apply to wound bed as instructed Secondary Dressing Woven Gauze Sponge, Non-Sterile 4x4 in Discharge Instruction: Apply over primary dressing as directed. Zetuvit Plus Silicone Border Dressing 4x4 (in/in) Discharge Instruction: Apply silicone border over primary dressing as directed. keystone Discharge Instruction: Apply [employer] Antibiotic Ointment once you get it in the mail. Secured With Compression Wrap Compression Stockings Environmental education officer) Signed: 06/15/2021 5:29:07 PM By: [name] Signed: 06/16/2021 11:45:44 AM By: Erenest Blank Entered By: Erenest Blank on  06/14/2021 14:14:39 -------------------------------------------------------------------------------- Vitals Details Patient Name: Date of Service: [name], [name] 06/14/2021 1:45 PM Medical Record Number: [id number] Patient Account Number: [id number] Date of  Birth/Sex: Treating RN: 03-08-1925 (88 y.o. [name], [name] Primary Care Seraphim Affinito: [name] Other Clinician: Referring Lauree Yurick: Treating Sabriyah Wilcher/Extender: [name], [name] in Treatment: 7 Vital Signs Time Taken: 13:58 Temperature (F): 97.9 Height (in): 72 Pulse (bpm): 71 Weight (lbs): 170 Respiratory Rate (breaths/min): 19 Body Mass Index (BMI): 23.1 Blood Pressure (mmHg): 156/67 Reference Range: 80 - 120 mg / dl Electronic Signature(s) Signed: 06/16/2021 11:45:44 AM By: Erenest Blank Entered By: Erenest Blank on 06/14/2021 13:59:03

## 2021-06-21 ENCOUNTER — Encounter (HOSPITAL_BASED_OUTPATIENT_CLINIC_OR_DEPARTMENT_OTHER): Payer: PPO | Admitting: Physician Assistant

## 2021-06-21 DIAGNOSIS — L97512 Non-pressure chronic ulcer of other part of right foot with fat layer exposed: Secondary | ICD-10-CM | POA: Diagnosis not present

## 2021-06-21 NOTE — Progress Notes (Addendum)
[name], [name] ([id number]) Visit Report for 06/21/2021 Arrival Information Details Patient Name: Date of Service: HA [name] B. 06/21/2021 1:45 PM Medical Record Number: [id number] Patient Account Number: [id number] Date of Birth/Sex: Treating RN: 1925-02-27 (88 y.o. Burnadette [name], [name] Primary Care Gabbie Marzo: [name] Other Clinician: Referring Jonisha Kindig: Treating Eudora Guevarra/Extender: [name] in Treatment: 8 Visit Information History Since Last Visit Added or deleted any medications: No Patient Arrived: Ambulatory Any new allergies or adverse reactions: No Arrival Time: 13:41 Had a fall or experienced change in No Accompanied By: wife activities of daily living that may affect Transfer Assistance: None risk of falls: Patient Identification Verified: Yes Signs or symptoms of abuse/neglect since last visito No Secondary Verification Process Completed: Yes Hospitalized since last visit: No Patient Requires Transmission-Based Precautions: No Implantable device outside of the clinic excluding No Patient Has Alerts: No cellular tissue based products placed in the center since last visit: Has Dressing in Place as Prescribed: Yes Pain Present Now: No Electronic Signature(s) Signed: 06/21/2021 4:06:51 PM By: [name] Entered By: Rhae Hammock on 06/21/2021 13:41:50 -------------------------------------------------------------------------------- Clinic Level of Care Assessment Details Patient Name: Date of Service: [name], CLA YBO [name] B. 06/21/2021 1:45 PM Medical Record Number: [id number] Patient Account Number: [id number] Date of Birth/Sex: Treating RN: 1925-11-28 (88 y.o. Burnadette [name], [name] Primary Care [name]: [name] Other Clinician: Referring [name]: Treating [name]/Extender: [name] in Treatment: 8 Clinic Level of Care Assessment Items TOOL 4 Quantity  Score X- 1 0 Use when only an EandM is performed on FOLLOW-UP visit ASSESSMENTS - Nursing Assessment / Reassessment X- 1 10 Reassessment of Co-morbidities (includes updates in patient status) X- 1 5 Reassessment of Adherence to Treatment Plan ASSESSMENTS - Wound and Skin A ssessment / Reassessment X - Simple Wound Assessment / Reassessment - one wound 1 5 '[]'$  - 0 Complex Wound Assessment / Reassessment - multiple wounds '[]'$  - 0 Dermatologic / Skin Assessment (not related to wound area) ASSESSMENTS - Focused Assessment X- 1 5 Circumferential Edema Measurements - multi extremities '[]'$  - 0 Nutritional Assessment / Counseling / Intervention '[]'$  - 0 Lower Extremity Assessment (monofilament, tuning fork, pulses) '[]'$  - 0 Peripheral Arterial Disease Assessment (using hand held doppler) ASSESSMENTS - Ostomy and/or Continence Assessment and Care '[]'$  - 0 Incontinence Assessment and Management '[]'$  - 0 Ostomy Care Assessment and Management (repouching, etc.) PROCESS - Coordination of Care X - Simple Patient / Family Education for ongoing care 1 15 '[]'$  - 0 Complex (extensive) Patient / Family Education for ongoing care X- 1 10 Staff obtains Programmer, systems, Records, T Results / Process Orders est '[]'$  - 0 Staff telephones HHA, Nursing Homes / Clarify orders / etc '[]'$  - 0 Routine Transfer to another Facility (non-emergent condition) '[]'$  - 0 Routine Hospital Admission (non-emergent condition) '[]'$  - 0 New Admissions / Biomedical engineer / Ordering NPWT Apligraf, etc. , '[]'$  - 0 Emergency Hospital Admission (emergent condition) X- 1 10 Simple Discharge Coordination '[]'$  - 0 Complex (extensive) Discharge Coordination PROCESS - Special Needs '[]'$  - 0 Pediatric / Minor Patient Management '[]'$  - 0 Isolation Patient Management '[]'$  - 0 Hearing / Language / Visual special needs '[]'$  - 0 Assessment of Community assistance (transportation, D/C planning, etc.) '[]'$  - 0 Additional assistance / Altered  mentation '[]'$  - 0 Support Surface(s) Assessment (bed, cushion, seat, etc.) INTERVENTIONS - Wound Cleansing / Measurement X - Simple Wound Cleansing - one wound 1 5 '[]'$  - 0 Complex  Wound Cleansing - multiple wounds X- 1 5 Wound Imaging (photographs - any number of wounds) '[]'$  - 0 Wound Tracing (instead of photographs) X- 1 5 Simple Wound Measurement - one wound '[]'$  - 0 Complex Wound Measurement - multiple wounds INTERVENTIONS - Wound Dressings X - Small Wound Dressing one or multiple wounds 1 10 '[]'$  - 0 Medium Wound Dressing one or multiple wounds '[]'$  - 0 Large Wound Dressing one or multiple wounds X- 1 5 Application of Medications - topical '[]'$  - 0 Application of Medications - injection INTERVENTIONS - Miscellaneous '[]'$  - 0 External ear exam '[]'$  - 0 Specimen Collection (cultures, biopsies, blood, body fluids, etc.) '[]'$  - 0 Specimen(s) / Culture(s) sent or taken to Lab for analysis '[]'$  - 0 Patient Transfer (multiple staff / Civil Service fast streamer / Similar devices) '[]'$  - 0 Simple Staple / Suture removal (25 or less) '[]'$  - 0 Complex Staple / Suture removal (26 or more) '[]'$  - 0 Hypo / Hyperglycemic Management (close monitor of Blood Glucose) '[]'$  - 0 Ankle / Brachial Index (ABI) - do not check if billed separately X- 1 5 Vital Signs Has the patient been seen at the hospital within the last three years: Yes Total Score: 95 Level Of Care: New/Established - Level 3 Electronic Signature(s) Signed: 06/21/2021 4:06:51 PM By: [name] Entered By: Rhae Hammock on 06/21/2021 14:11:32 -------------------------------------------------------------------------------- Encounter Discharge Information Details Patient Name: Date of Service: [name], CLA YBO [name] B. 06/21/2021 1:45 PM Medical Record Number: [id number] Patient Account Number: [id number] Date of Birth/Sex: Treating RN: 1925/11/24 (88 y.o. Burnadette [name], [name] Primary Care Ardit Danh: [name] Other Clinician: Referring  Delaney Schnick: Treating [name]/Extender: [name] in Treatment: 8 Encounter Discharge Information Items Discharge Condition: Stable Ambulatory Status: Ambulatory Discharge Destination: Home Transportation: Private Auto Accompanied By: wife Schedule Follow-up Appointment: Yes Clinical Summary of Care: Patient Declined Electronic Signature(s) Signed: 06/21/2021 4:06:51 PM By: [name] Entered By: Rhae Hammock on 06/21/2021 14:12:02 -------------------------------------------------------------------------------- Lower Extremity Assessment Details Patient Name: Date of Service: [name], CLA YBO [name] B. 06/21/2021 1:45 PM Medical Record Number: [id number] Patient Account Number: [id number] Date of Birth/Sex: Treating RN: 1925/01/13 (88 y.o. Burnadette [name], [name] Primary Care Maleni Seyer: [name] Other Clinician: Referring Kambryn Dapolito: Treating Mally Gavina/Extender: [name], [name] in Treatment: 8 Edema Assessment Assessed: [Left: No] Patrice Paradise: Yes] Edema: [Left: N] [Right: o] Calf Left: Right: Point of Measurement: 33 cm From Medial Instep 32.1 cm Ankle Left: Right: Point of Measurement: 8 cm From Medial Instep 20.4 cm Vascular Assessment Pulses: Dorsalis Pedis Palpable: [Right:Yes] Posterior Tibial Palpable: [Right:Yes] Electronic Signature(s) Signed: 06/21/2021 4:06:51 PM By: [name] Entered By: Rhae Hammock on 06/21/2021 13:46:03 -------------------------------------------------------------------------------- Multi-Disciplinary Care Plan Details Patient Name: Date of Service: [name], CLA YBO [name] B. 06/21/2021 1:45 PM Medical Record Number: [id number] Patient Account Number: [id number] Date of Birth/Sex: Treating RN: Mar 09, 1925 (88 y.o. Burnadette [name], [name] Primary Care Lasasha Brophy: [name] Other Clinician: Referring Bobie Caris: Treating Zavannah Deblois/Extender: [name], [name] in Treatment: 8 Active Inactive Electronic Signature(s) Signed: 08/04/2021 2:04:28 PM By: [name] Previous Signature: 06/21/2021 4:06:51 PM Version By: [name] Entered By: Rhae Hammock on 07/17/2021 07:58:32 -------------------------------------------------------------------------------- Pain Assessment Details Patient Name: Date of Service: [name], CLA YBO [name] B. 06/21/2021 1:45 PM Medical Record Number: [id number] Patient Account Number: [id number] Date of Birth/Sex: Treating RN: 08-Feb-1925 (88 y.o. Erie Noe Primary Care Valeria Krisko: [name] Other Clinician: Referring Hunt Zajicek: Treating Hiren Peplinski/Extender: Joaquim Lai  III, Boyce Medici, [name] in Treatment: 8 Active Problems Location of Pain Severity and Description of Pain Patient Has Paino No Site Locations Pain Management and Medication Current Pain Management: Electronic Signature(s) Signed: 06/21/2021 4:06:51 PM By: [name] Entered By: Rhae Hammock on 06/21/2021 13:41:45 -------------------------------------------------------------------------------- Patient/Caregiver Education Details Patient Name: Date of Service: [name], CLA YBO [name] B. 6/21/2023andnbsp1:45 PM Medical Record Number: [id number] Patient Account Number: [id number] Date of Birth/Gender: Treating RN: 07/10/1925 (88 y.o. Erie Noe Primary Care Physician: [name] Other Clinician: Referring Physician: Treating Physician/Extender: [name] in Treatment: 8 Education Assessment Education Provided To: Patient Education Topics Provided Wound/Skin Impairment: Methods: Explain/Verbal Responses: Reinforcements needed, State content correctly Electronic Signature(s) Signed: 06/21/2021 4:06:51 PM By: [name] Entered By: Rhae Hammock on 06/21/2021  14:10:51 -------------------------------------------------------------------------------- Wound Assessment Details Patient Name: Date of Service: [name], CLA YBO [name] B. 06/21/2021 1:45 PM Medical Record Number: [id number] Patient Account Number: [id number] Date of Birth/Sex: Treating RN: Nov 28, 1925 (88 y.o. Burnadette [name], [name] Primary Care Latwan Luchsinger: [name] Other Clinician: Referring Huie Ghuman: Treating Moxon Messler/Extender: [name], [name] in Treatment: 8 Wound Status Wound Number: 1 Primary Etiology: Atypical Wound Location: Right, Dorsal Foot Wound Status: Open Wounding Event: Gradually Appeared Comorbid History: Coronary Artery Disease, Hypertension, Osteoarthritis Date Acquired: 04/26/2020 Weeks Of Treatment: 8 Clustered Wound: No Photos Wound Measurements Length: (cm) 2.5 Width: (cm) 3 Depth: (cm) 0.2 Area: (cm) 5.89 Volume: (cm) 1.178 % Reduction in Area: -36.3% % Reduction in Volume: -36.3% Epithelialization: Small (1-33%) Tunneling: No Undermining: No Wound Description Classification: Full Thickness With Exposed Support Structures Wound Margin: Distinct, outline attached Exudate Amount: Medium Exudate Type: Purulent Exudate Color: yellow, brown, green Foul Odor After Cleansing: Yes Due to Product Use: No Slough/Fibrino Yes Wound Bed Granulation Amount: Large (67-100%) Exposed Structure Granulation Quality: Red, Pink Fascia Exposed: No Necrotic Amount: Small (1-33%) Fat Layer (Subcutaneous Tissue) Exposed: Yes Necrotic Quality: Adherent Slough Tendon Exposed: No Muscle Exposed: No Joint Exposed: No Bone Exposed: No Electronic Signature(s) Signed: 06/21/2021 4:06:51 PM By: [name] Signed: 06/21/2021 4:19:27 PM By: Deon Pilling RN, BSN Entered By: Deon Pilling on 06/21/2021 13:47:39 -------------------------------------------------------------------------------- Vitals Details Patient Name: Date of  Service: [name], CLA YBO [name] B. 06/21/2021 1:45 PM Medical Record Number: [id number] Patient Account Number: [id number] Date of Birth/Sex: Treating RN: 08-04-1925 (88 y.o. Burnadette [name], [name] Primary Care Krissi Willaims: [name] Other Clinician: Referring Leightyn Cina: Treating Kamdin Follett/Extender: [name], [name] in Treatment: 8 Vital Signs Time Taken: 13:41 Temperature (F): 97.7 Height (in): 72 Pulse (bpm): 88 Weight (lbs): 170 Respiratory Rate (breaths/min): 17 Body Mass Index (BMI): 23.1 Blood Pressure (mmHg): 159/69 Reference Range: 80 - 120 mg / dl Electronic Signature(s) Signed: 06/21/2021 4:06:51 PM By: [name] Entered By: Rhae Hammock on 06/21/2021 13:43:44

## 2021-06-21 NOTE — Progress Notes (Addendum)
[name] ([id number]) Visit Report for 06/21/2021 Chief Complaint Document Details Patient Name: Date of Service: Tyler LL, [name] 06/21/2021 1:45 PM Medical Record Number: [id number] Patient Account Number: [id number] Date of Birth/Sex: Treating RN: Apr 02, 1925 (88 y.o. [name], [name] Primary Care Provider: Merrilee [name] Other Clinician: Referring Provider: Treating Provider/Extender: [name], [name] in Treatment: 8 Information Obtained from: Patient Chief Complaint Right dorsal foot ulcer Electronic Signature(s) Signed: 06/21/2021 1:49:36 PM By: [name] PA-C Entered By: [name] on 06/21/2021 13:49:36 -------------------------------------------------------------------------------- HPI Details Patient Name: Date of Service: Tyler LL, [name] 06/21/2021 1:45 PM Medical Record Number: [id number] Patient Account Number: [id number] Date of Birth/Sex: Treating RN: 1925-07-29 (87 y.o. [name], [name] Primary Care Provider: Merrilee [name] Other Clinician: Referring Provider: Treating Provider/Extender: [name] in Treatment: 8 History of Present Illness HPI Description: 04-26-2021 upon evaluation today patient presents for initial inspection here in the clinic has been having an issue with a dorsal foot wound on the right foot for the past year. He tells me at this time that he does not even really know what brought this on. Fortunately he does not seem to have any signs of infection here and has been given some acting medication which was benzyl peroxide forward from dermatology that really has not helped. He has not had anything really to put on it from a dressing standpoint other than just utilizing some Neosporin over-the-counter for the most part. Nonetheless he figured he would come get this checked out since have seen his wife before and we have been able to get her healed he is  hoping we can do the same here. Patient does have a history of hypertension, chronic venous insufficiency though he does wear compression socks that he does not appear to be swollen at this time. He also has a history of coronary artery disease. 05-03-2021 upon evaluation today patient appears to be doing well with regard to the wound on the top of his foot this is actually showing signs of excellent improvement which is great news and overall I am extremely pleased with where we stand today. There does not appear to be any evidence of active infection locally or systemically which is great news. 05-10-2021 upon evaluation today patient appears to be doing about the same in regard to his wound. Unfortunately I do still think that we are not making significant progress here. I believe that the patient probably needs something a little bit better to manage this effectively. I really believe the compression sock would not be optimal in this case as it is continuing to pull the dressing off. For that reason I think the best bet is probably going to be for [place] to look into compression wrapping. We have avoided this due to the fact that we did have ABIs and the patient is somewhat leery to want to go down that road he tells me that at his age, 15, he is really not interested in a lot of evaluation and treatment with regard to his blood flow. I completely understand that but this very well may be part of our issue here. Nonetheless he is wanting to opt for a lighter compression wrap to see if that could be beneficial and if we get this healed without a lot of other testing and so long. 05-17-2021 upon evaluation today patient appears to be doing well with regard to his wound although were not significantly  smaller here. Fortunately I do not see any evidence of active infection locally or systemically which is great news and overall I am pleased in that regard. No fevers, chills, nausea, vomiting, or diarrhea.  Again we have discussed the possibility of a biopsy if things were not getting better. The wound is slightly better the leg is significantly smaller. With that being said he had a lot of drainage over the past week which again does affect things to some degree as well for that reason I am probably hold off on a biopsy for 1 more week if we do a biopsy and will need to be a fairly superficial punch as I do not want to do anything too deep in case blood flow is an issue here. He has really declined going for any significant arterial studies and therefore I cannot be certain that his blood flow is sufficient to sustain a aggressive intervention in that regard. Nonetheless we may need to know for sure whether this is any significant evidence of a cancerous lesion or otherwise. 05-24-2021 upon evaluation today patient's wound really is not showing significant improvement at all unfortunately. I do believe at this time that he would be a candidate for a couple things were going to try to switch up his dressings to the degree and had an antibiotic, also can obtain a culture and subsequently I am going to continue to suggest that if things or not better by next week that we should probably do a culture. In fact that is my plan unless he is significantly smaller come next week. The patient voiced understanding. 05-31-2021 upon evaluation today patient's wound is looking a little better but still about the same size. I did get his results back from the culture it did show that he had evidence of bacterial infection noted at this point. This included 2 bacteria neither which are actually going to be managed with the doxycycline. For that reason I Georgina Peer recommend that he stop that and we will get a place him on cefdinir. He was positive for both Enterobacter as well as Proteus. Both of which were sensitive to the cefdinir. 6/7; wound on the dorsal right first metatarsal head of undetermined etiology that has been  present for about a year. He is on antibiotics for 2 weeks cefdinir and tolerating this well. Using [facility] on the wound 06-14-2021 upon evaluation today patient appears to be doing really a little worse in regard to the overall appearance of his wound. Fortunately there does not appear to be any signs of infection which is great news but unfortunately I do think that we are still not seeing any improvement despite everything we have tried up to this point. I do believe the Keystone topical antibiotics could be beneficial I Georgina Peer go ahead and order that for him today we will get that sent in and that will be shipped to his home. With that being said I also think it is probably time for [place] to go ahead and do a biopsy. 06-21-2021 upon evaluation today patient presents for follow-up after having his biopsy. 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 This is what I was fearful of as I was treating him over the past several weeks. We have discussed this multiple times. I am glad we finally did the biopsy we have a definitive idea now of what exactly is indeed going on. With that being said we are going to  need to make a referral to the skin surgery center for him I think this is good to become necessary in order to treat the situation at this point patient is in agreement with the plan. He tells me he really does not see that he has much choice but he wants to keep it as simple as possible so we can refer him here in [place] which I think is perfect. Electronic Signature(s) Signed: 06/21/2021 4:57:26 PM By: [name] PA-C Entered By: [name] on 06/21/2021 16:57:26 -------------------------------------------------------------------------------- Physical Exam Details Patient Name: Date of Service: Tyler LL, [name] 06/21/2021 1:45 PM Medical Record Number: [id number] Patient Account Number: [id number] Date of Birth/Sex: Treating RN: 1925-08-05 (88  y.o. Erie Noe Primary Care Provider: Merrilee [name] Other Clinician: Referring Provider: Treating Provider/Extender: [name], [name] in Treatment: 8 Constitutional Well-nourished and well-hydrated in no acute distress. Respiratory normal breathing without difficulty. Psychiatric this patient is able to make decisions and demonstrates good insight into disease process. Alert and Oriented x 3. pleasant and cooperative. Notes Upon inspection patient's wound bed actually appears to be doing a little bit better from a visual standpoint but still is showing signs of being a skin cancer at this time so it needs to be taken care of. In the meantime we will get a continue with the topical Keystone antibiotics which I think is still good to be ideal for him. Electronic Signature(s) Signed: 06/21/2021 4:57:47 PM By: [name] PA-C Entered By: [name] on 06/21/2021 16:57:46 -------------------------------------------------------------------------------- Physician Orders Details Patient Name: Date of Service: Tyler LL, [name] 06/21/2021 1:45 PM Medical Record Number: [id number] Patient Account Number: [id number] Date of Birth/Sex: Treating RN: February 08, 1925 (88 y.o. [name], [name] Primary Care Provider: Merrilee [name] Other Clinician: Referring Provider: Treating Provider/Extender: [name] in Treatment: 8 Verbal / Phone Orders: No Diagnosis Coding ICD-10 Coding Code Description L98.8 Other specified disorders of the skin and subcutaneous tissue L97.512 Non-pressure chronic ulcer of other part of right foot with fat layer exposed I25.10 Atherosclerotic heart disease of native coronary artery without angina pectoris I10 Essential (primary) hypertension I87.2 Venous insufficiency (chronic) (peripheral) Follow-up Appointments Return appointment in 3 weeks. - 07/12/21 @ 1345 w/ Jeri Cos, PA and  [place] # 9 Bathing/ Shower/ Hygiene May shower with protection but do not get wound dressing(s) wet. - You may shower using a cast protector over top of your wrap!!! Edema Control - Lymphedema / SCD / Other Elevate legs to the level of the heart or above for 30 minutes daily and/or when sitting, a frequency of: - 3-4 times a day throughout the day. Avoid standing for long periods of time. Patient to wear own compression stockings every day. Wound Treatment Wound #1 - Foot Wound Laterality: Dorsal, Right Cleanser: Soap and Water 2 x Per MEQ/68 Days Discharge Instructions: May shower and wash wound with dial antibacterial soap and water prior to dressing change. Cleanser: Wound Cleanser (Generic) 2 x Per Day/15 Days Discharge Instructions: Cleanse the wound with wound cleanser prior to applying a clean dressing using gauze sponges, not tissue or cotton balls. Peri-Wound Care: Zinc Oxide Ointment 30g tube 2 x Per Day/15 Days Discharge Instructions: Apply Zinc Oxide to periwound with each dressing change Peri-Wound Care: Sween Lotion (Moisturizing lotion) 2 x Per Day/15 Days Discharge Instructions: Apply moisturizing lotion as directed Topical: keystone 2 x Per Day/15 Days Prim Dressing: Hydrofera Blue Ready Foam,  2.5 x2.5 in 2 x Per Day/15 Days ary Discharge Instructions: Apply to wound bed as instructed Secondary Dressing: Woven Gauze Sponge, Non-Sterile 4x4 in (Generic) 2 x Per Day/15 Days Discharge Instructions: Apply over primary dressing as directed. Secondary Dressing: Zetuvit Plus Silicone Border Dressing 4x4 (in/in) (Generic) 2 x Per Day/15 Days Discharge Instructions: Apply silicone border over primary dressing as directed. Secondary Dressing: keystone 2 x Per CLE/75 Days Discharge Instructions: Apply [employer] Antibiotic Ointment once you get it in the mail. 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 06/21/2021 1:45 PM Medical Record Number: [id number] Patient Account Number: [id number] Date of Birth/Sex: Treating RN: 05/21/1925 (88 y.o. [name], [name] Primary Care Provider: Merrilee [name] Other Clinician: Referring Provider: Treating Provider/Extender: [name] in Treatment: 8 Active Problems ICD-10 Encounter Code Description Active Date MDM Diagnosis L98.8 Other specified disorders of the skin and subcutaneous tissue 04/26/2021 No Yes L97.512 Non-pressure chronic ulcer of other part of right foot with fat layer exposed 04/26/2021 No Yes I25.10 Atherosclerotic heart disease of native coronary artery without angina pectoris 04/26/2021 No Yes I10 Essential (primary) hypertension 04/26/2021 No  Yes I87.2 Venous insufficiency (chronic) (peripheral) 04/26/2021 No Yes Inactive Problems Resolved Problems Electronic Signature(s) Signed: 06/21/2021 1:40:32 PM By: [name] PA-C Entered By: [name] on 06/21/2021 13:40:32 -------------------------------------------------------------------------------- Progress Note Details Patient Name: Date of Service: Tyler LL, [name] 06/21/2021 1:45 PM Medical Record Number: [id number] Patient Account Number: [id number] Date of Birth/Sex: Treating RN: [birth year] (88 y.o. [name], [name] Primary Care Provider: Merrilee [name] Other Clinician: Referring Provider: Treating Provider/Extender: [name] in Treatment: 8 Subjective Chief Complaint Information obtained from Patient Right dorsal foot ulcer History of Present Illness (HPI) 04-26-2021 upon evaluation today patient presents for initial inspection here in the clinic has been having an issue with a dorsal foot wound on the right foot for the past year. He tells me at this time that he does not even really know what brought this on. Fortunately he does not seem to have any signs of infection here and has been given some acting medication which was benzyl peroxide forward from dermatology that really has not helped. He has not had anything really to put on it from a dressing standpoint other than just utilizing some Neosporin over-the-counter for the most part. Nonetheless he figured he would come get this checked out since have seen his wife before and we have been able to get her healed he is hoping we can do the same here.  Patient does have a history of hypertension, chronic venous insufficiency though he does wear compression socks that he does not appear to be swollen at this time. He also has a history of coronary artery disease. 05-03-2021 upon evaluation today patient appears to be doing well with regard to the wound on the top of his  foot this is actually showing signs of excellent improvement which is great news and overall I am extremely pleased with where we stand today. There does not appear to be any evidence of active infection locally or systemically which is great news. 05-10-2021 upon evaluation today patient appears to be doing about the same in regard to his wound. Unfortunately I do still think that we are not making significant progress here. I believe that the patient probably needs something a little bit better to manage this effectively. I really believe the compression sock would not be optimal in this case as it is continuing to pull the dressing off. For that reason I think the best bet is probably going to be for [place] to look into compression wrapping. We have avoided this due to the fact that we did have ABIs and the patient is somewhat leery to want to go down that road he tells me that at his age, 40, he is really not interested in a lot of evaluation and treatment with regard to his blood flow. I completely understand that but this very well may be part of our issue here. Nonetheless he is wanting to opt for a lighter compression wrap to see if that could be beneficial and if we get this healed without a lot of other testing and so long. 05-17-2021 upon evaluation today patient appears to be doing well with regard to his wound although were not significantly smaller here. Fortunately I do not see any evidence of active infection locally or systemically which is great news and overall I am pleased in that regard. No fevers, chills, nausea, vomiting, or diarrhea. Again we have discussed the possibility of a biopsy if things were not getting better. The wound is slightly better the leg is significantly smaller. With that being said he had a lot of drainage over the past week which again does affect things to some degree as well for that reason I am probably hold off on a biopsy for 1 more week if we do a biopsy and  will need to be a fairly superficial punch as I do not want to do anything too deep in case blood flow is an issue here. He has really declined going for any significant arterial studies and therefore I cannot be certain that his blood flow is sufficient to sustain a aggressive intervention in that regard. Nonetheless we may need to know for sure whether this is any significant evidence of a cancerous lesion or otherwise. 05-24-2021 upon evaluation today patient's wound really is not showing significant improvement at all unfortunately. I do believe at this time that he would be a candidate for a couple things were going to try to switch up his dressings to the degree and had an antibiotic, also can obtain a culture and subsequently I am going to continue to suggest that if things or not better by next week that we should probably do a culture. In fact that is my plan unless he is significantly smaller come next week. The patient voiced understanding. 05-31-2021 upon evaluation today patient's wound is looking a little better but still about the same size. I  did get his results back from the culture it did show that he had evidence of bacterial infection noted at this point. This included 2 bacteria neither which are actually going to be managed with the doxycycline. For that reason I Georgina Peer recommend that he stop that and we will get a place him on cefdinir. He was positive for both Enterobacter as well as Proteus. Both of which were sensitive to the cefdinir. 6/7; wound on the dorsal right first metatarsal head of undetermined etiology that has been present for about a year. He is on antibiotics for 2 weeks cefdinir and tolerating this well. Using [facility] on the wound 06-14-2021 upon evaluation today patient appears to be doing really a little worse in regard to the overall appearance of his wound. Fortunately there does not appear to be any signs of infection which is great news but unfortunately  I do think that we are still not seeing any improvement despite everything we have tried up to this point. I do believe the Keystone topical antibiotics could be beneficial I Georgina Peer go ahead and order that for him today we will get that sent in and that will be shipped to his home. With that being said I also think it is probably time for [place] to go ahead and do a biopsy. 06-21-2021 upon evaluation today patient presents for follow-up after having his biopsy. 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 This is what I was fearful of as I was treating him over the past several weeks. We have discussed this multiple times. I am glad we finally did the biopsy we have a definitive idea now of what exactly is indeed going on. With that being said we are going to need to make a referral to the skin surgery center for him I think this is good to become necessary in order to treat the situation at this point patient is in agreement with the plan. He tells me he really does not see that he has much choice but he wants to keep it as simple as possible so we can refer him here in [place] which I think is perfect. Objective Constitutional Well-nourished and well-hydrated in no acute distress. Vitals Time Taken: 1:41 PM, Height: 72 in, Weight: 170 lbs, BMI: 23.1, Temperature: 97.7 F, Pulse: 88 bpm, Respiratory Rate: 17 breaths/min, Blood Pressure: 159/69 mmHg. Respiratory normal breathing without difficulty. Psychiatric this patient is able to make decisions and demonstrates good insight into disease process. Alert and Oriented x 3. pleasant and cooperative. General Notes: Upon inspection patient's wound bed actually appears to be doing a little bit better from a visual standpoint but still is showing signs of being a skin cancer at this time so it needs to be taken care of. In the meantime we will get a continue with the topical Keystone antibiotics which I think is still good  to be ideal for him. Integumentary (Hair, Skin) Wound #1 status is Open. Original cause of wound was Gradually Appeared. The date acquired was: 04/26/2020. The wound has been in treatment 8 weeks. The wound is located on the Right,Dorsal Foot. The wound measures 2.5cm length x 3cm width x 0.2cm depth; 5.89cm^2 area and 1.178cm^3 volume. There is Fat Layer (Subcutaneous Tissue) exposed. There is no tunneling or undermining noted. There is a medium amount of purulent drainage noted. Foul odor after cleansing was noted. The wound margin is distinct with the outline attached to the  wound base. There is large (67-100%) red, pink granulation within the wound bed. There is a small (1-33%) amount of necrotic tissue within the wound bed including Adherent Slough. Assessment Active Problems ICD-10 Other specified disorders of the skin and subcutaneous tissue Non-pressure chronic ulcer of other part of right foot with fat layer exposed Atherosclerotic heart disease of native coronary artery without angina pectoris Essential (primary) hypertension Venous insufficiency (chronic) (peripheral) Plan Follow-up Appointments: Return appointment in 3 weeks. - 07/12/21 @ 1345 w/ Jeri Cos, PA and [place] # 9 Bathing/ Shower/ Hygiene: May shower with protection but do not get wound dressing(s) wet. - You may shower using a cast protector over top of your wrap!!! Edema Control - Lymphedema / SCD / Other: Elevate legs to the level of the heart or above for 30 minutes daily and/or when sitting, a frequency of: - 3-4 times a day throughout the day. Avoid standing for long periods of time. Patient to wear own compression stockings every day. 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 Cleanser: Wound Cleanser (Generic) 2 x Per Day/15 Days Discharge Instructions: Cleanse the wound with wound cleanser prior to applying a clean dressing using gauze sponges, not tissue or cotton balls. Peri-Wound Care: Zinc Oxide Ointment 30g tube 2 x Per Day/15 Days Discharge Instructions: Apply Zinc Oxide to periwound with each dressing change Peri-Wound Care: Sween Lotion (Moisturizing lotion) 2 x Per Day/15 Days Discharge Instructions: Apply moisturizing lotion as directed Topical: keystone 2 x Per Day/15 Days Prim Dressing: Hydrofera Blue Ready Foam, 2.5 x2.5 in 2 x Per Day/15 Days ary Discharge Instructions: Apply to wound bed as instructed Secondary Dressing: Woven Gauze Sponge, Non-Sterile 4x4 in (Generic) 2 x Per Day/15 Days Discharge Instructions: Apply over primary dressing as directed. Secondary Dressing: Zetuvit Plus Silicone Border Dressing 4x4 (in/in) (Generic) 2 x Per Day/15 Days Discharge Instructions: Apply silicone border over primary dressing as directed. Secondary Dressing: keystone 2 x Per XFG/18 Days Discharge Instructions: Apply [employer] Antibiotic Ointment once you get it in the mail. 1. We will continue with the topical Keystone antibiotics. 2. Also can continue with the Zetuvit bordered foam dressing to cover. 3. I am also can recommend the [facility] which does seem to be doing well. 4. We did make referral to the skin surgery center for this to be definitively taken care of the patient was in agreement with plan and I think this should hopefully do quite well for him. We will see patient back for reevaluation in 1 week here in the clinic. If anything worsens or changes patient will contact our office for additional recommendations. Electronic Signature(s) Signed: 06/21/2021 4:58:21 PM By: [name] PA-C Entered By: [name] on 06/21/2021  16:58:21 -------------------------------------------------------------------------------- SuperBill Details Patient Name: Date of Service: Tyler LL, [name] 06/21/2021 Medical Record Number: [id number] Patient Account Number: [id number] Date of Birth/Sex: Treating RN: [birth year] (88 y.o. [name], [name] Primary Care Provider: Merrilee [name] Other Clinician: Referring Provider: Treating Provider/Extender: [name] in Treatment: 8 Diagnosis Coding ICD-10 Codes Code Description L98.8 Other specified disorders of the skin and subcutaneous tissue L97.512 Non-pressure chronic ulcer of other part of right foot with fat layer exposed I25.10  Atherosclerotic heart disease of native coronary artery without angina pectoris I10 Essential (primary) hypertension I87.2 Venous insufficiency (chronic) (peripheral) Facility Procedures CPT4 Code: 16109604 Description: 99213 - WOUND CARE VISIT-LEV 3 EST PT Modifier: Quantity: 1 Physician Procedures : CPT4 Code Description Modifier [id number] 91478 - WC PHYS LEVEL 4 - EST PT ICD-10 Diagnosis Description L98.8 Other specified disorders of the skin and subcutaneous tissue L97.512 Non-pressure chronic ulcer of other part of right foot with fat layer  exposed I25.10 Atherosclerotic heart disease of native coronary artery without angina pectoris I10 Essential (primary) hypertension Quantity: 1 Electronic Signature(s) Signed: 06/21/2021 4:58:54 PM By: [name] PA-C Previous Signature: 06/21/2021 4:06:51 PM Version By: [name] Entered By: [name] on 06/21/2021 16:58:53

## 2021-07-05 DIAGNOSIS — I872 Venous insufficiency (chronic) (peripheral): Secondary | ICD-10-CM | POA: Diagnosis not present

## 2021-07-05 DIAGNOSIS — C44722 Squamous cell carcinoma of skin of right lower limb, including hip: Secondary | ICD-10-CM | POA: Diagnosis not present

## 2021-07-12 ENCOUNTER — Ambulatory Visit (HOSPITAL_BASED_OUTPATIENT_CLINIC_OR_DEPARTMENT_OTHER): Payer: PPO | Admitting: Physician Assistant

## 2021-07-13 ENCOUNTER — Inpatient Hospital Stay: Payer: PPO | Admitting: Internal Medicine

## 2021-07-13 ENCOUNTER — Encounter: Payer: Self-pay | Admitting: Internal Medicine

## 2021-07-13 ENCOUNTER — Inpatient Hospital Stay: Payer: PPO | Attending: Internal Medicine

## 2021-07-13 ENCOUNTER — Other Ambulatory Visit: Payer: Self-pay

## 2021-07-13 VITALS — BP 181/53 | HR 79 | Temp 97.7°F | Resp 18 | Wt 174.2 lb

## 2021-07-13 DIAGNOSIS — Z85828 Personal history of other malignant neoplasm of skin: Secondary | ICD-10-CM | POA: Diagnosis not present

## 2021-07-13 DIAGNOSIS — D500 Iron deficiency anemia secondary to blood loss (chronic): Secondary | ICD-10-CM

## 2021-07-13 DIAGNOSIS — N189 Chronic kidney disease, unspecified: Secondary | ICD-10-CM | POA: Diagnosis not present

## 2021-07-13 DIAGNOSIS — D649 Anemia, unspecified: Secondary | ICD-10-CM | POA: Insufficient documentation

## 2021-07-13 DIAGNOSIS — I129 Hypertensive chronic kidney disease with stage 1 through stage 4 chronic kidney disease, or unspecified chronic kidney disease: Secondary | ICD-10-CM | POA: Insufficient documentation

## 2021-07-13 LAB — IRON AND IRON BINDING CAPACITY (CC-WL,HP ONLY)
Iron: 58 ug/dL (ref 45–182)
Saturation Ratios: 19 % (ref 17.9–39.5)
TIBC: 300 ug/dL (ref 250–450)
UIBC: 242 ug/dL (ref 117–376)

## 2021-07-13 LAB — CBC WITH DIFFERENTIAL (CANCER CENTER ONLY)
Abs Immature Granulocytes: 0.03 10*3/uL (ref 0.00–0.07)
Basophils Absolute: 0 10*3/uL (ref 0.0–0.1)
Basophils Relative: 1 %
Eosinophils Absolute: 0.4 10*3/uL (ref 0.0–0.5)
Eosinophils Relative: 7 %
HCT: 32.2 % — ABNORMAL LOW (ref 39.0–52.0)
Hemoglobin: 11 g/dL — ABNORMAL LOW (ref 13.0–17.0)
Immature Granulocytes: 1 %
Lymphocytes Relative: 23 %
Lymphs Abs: 1.5 10*3/uL (ref 0.7–4.0)
MCH: 33.6 pg (ref 26.0–34.0)
MCHC: 34.2 g/dL (ref 30.0–36.0)
MCV: 98.5 fL (ref 80.0–100.0)
Monocytes Absolute: 0.6 10*3/uL (ref 0.1–1.0)
Monocytes Relative: 9 %
Neutro Abs: 3.8 10*3/uL (ref 1.7–7.7)
Neutrophils Relative %: 59 %
Platelet Count: 154 10*3/uL (ref 150–400)
RBC: 3.27 MIL/uL — ABNORMAL LOW (ref 4.22–5.81)
RDW: 14.5 % (ref 11.5–15.5)
WBC Count: 6.3 10*3/uL (ref 4.0–10.5)
nRBC: 0 % (ref 0.0–0.2)

## 2021-07-13 LAB — FERRITIN: Ferritin: 43 ng/mL (ref 24–336)

## 2021-07-13 NOTE — Progress Notes (Signed)
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  PRIOR THERAPY: Iron infusion with Venofer 300 mg IV weekly for 3 weeks.  Last infusion was given in September 2022.  CURRENT THERAPY: None  INTERVAL HISTORY: [name] 88 y.o. male returns to the clinic today for follow-up visit accompanied by his wife.  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  He lost some blood during this procedure.  He denied having any current chest pain, shortness of breath, cough or hemoptysis.  He denied having any nausea, vomiting, diarrhea or constipation.  He has no headache or visual changes.  He is here today for evaluation with repeat blood work.  MEDICAL HISTORY: Past Medical History:  Diagnosis Date   Anemia    low hemoglobin   Arthritis    AV (angiodysplasia malformation of colon)    Benign prostatic hypertrophy    Cancer (HCC)    basal cell cancer   Chronic kidney disease    elevated Cr, BUN, Dr. Hassell Done manages   COPD (chronic obstructive pulmonary disease) ([place])    Coronary artery disease    GERD (gastroesophageal reflux disease)    Gout    Heart murmur    History of kidney stones    Hyperlipidemia    Hypertension    Influenza    February 2019   Mitral regurgitation    Mitral valve prolapse    Pneumonia 1970's   PONV (postoperative nausea and vomiting)    1st knee replacement   Shortness of breath     ALLERGIES:  has No Known Allergies.  MEDICATIONS:  Current Outpatient Medications  Medication Sig Dispense Refill   acetaminophen (TYLENOL) 325 MG tablet Take 650 mg by mouth every 6 (six) hours as needed for mild pain or headache.      allopurinol (ZYLOPRIM) 300 MG tablet Take 150 mg by mouth daily.      aspirin EC 81 MG tablet Take 81 mg by mouth 2 (two) times a week. Swallow whole.     atorvastatin (LIPITOR) 10 MG tablet TAKE 1 TABLET (10 MG TOTAL) BY MOUTH DAILY AT 6 PM. 90 tablet 3   diltiazem (CARDIZEM CD) 300 MG 24 hr capsule Take 300 mg by mouth daily.     lisinopril (ZESTRIL) 20 MG tablet Take 20 mg by mouth daily.     omeprazole (PRILOSEC OTC) 20 MG tablet Take 20 mg by mouth every other day.     polyethylene glycol (MIRALAX / GLYCOLAX) packet Take 17 g by mouth daily as needed for moderate constipation.      No current facility-administered medications for this visit.    SURGICAL HISTORY:  Past Surgical History:  Procedure Laterality Date   ABDOMINAL ANGIOGRAM  04/30/2011   Procedure: ABDOMINAL ANGIOGRAM;  Surgeon: Lorretta Harp, MD;  Location[facility] CATH LAB;  Service: Cardiovascular;;   CARDIAC CATHETERIZATION  04/2011   x2   CARDIAC CATHETERIZATION  08/2009   CATARACT EXTRACTION  2011   bilateral   COLONOSCOPY  05/04/2011   Procedure: COLONOSCOPY;  Surgeon: Beryle Beams, MD;  Location: [facility] ENDOSCOPY;  Service: Endoscopy;  Laterality: N/A;   COLONOSCOPY N/A 07/16/2013   Procedure: COLONOSCOPY;  Surgeon: Saralyn Pilar  Renee Ramus, MD;  Location: [facility];  Service: Endoscopy;  Laterality: N/A;   COLONOSCOPY WITH PROPOFOL N/A 06/20/2017   Procedure: COLONOSCOPY WITH PROPOFOL;  Surgeon: Carol Ada, MD;  Location: [facility]OS[facility];  Service: Endoscopy;  Laterality: N/A;   CORONARY ANGIOGRAM  04/30/2011   Procedure: CORONARY ANGIOGRAM;  Surgeon: Lorretta Harp, MD;  Location: [facility] CATH LAB;  Service: Cardiovascular;;   ESOPHAGOGASTRODUODENOSCOPY  05/04/2011   Procedure: ESOPHAGOGASTRODUODENOSCOPY (EGD);  Surgeon: Beryle Beams, MD;  Location: [facility];  Service: Endoscopy;  Laterality: N/A;   EYE SURGERY     bilateral cataracts removed   HERNIA REPAIR  1999&2000   bilateral   LOBECTOMY  1980   left lower lobectomy  for benign tumor   OPEN REDUCTION INTERNAL FIXATION (ORIF) DISTAL RADIAL FRACTURE Left 03/19/2017   Procedure: OPEN REDUCTION INTERNAL FIXATION (ORIF) DISTAL RADIAL FRACTURE;  Surgeon: Dorna Leitz, MD;  Location: [place];  Service: Orthopedics;  Laterality: Left;  AXILLARY BLOCK   POLYPECTOMY  06/20/2017   Procedure: POLYPECTOMY;  Surgeon: Carol Ada, MD;  Location: [facility];  Service: Endoscopy;;   REPLACEMENT TOTAL KNEE  2005   right   RIGHT HEART CATHETERIZATION  04/30/2011   Procedure: RIGHT HEART CATH;  Surgeon: Lorretta Harp, MD;  Location: [facility] CATH LAB;  Service: Cardiovascular;;   TEE WITHOUT CARDIOVERSION  04/30/2011   Procedure: TRANSESOPHAGEAL ECHOCARDIOGRAM (TEE);  Surgeon: Sanda Klein, MD;  Location: [facility] ENDOSCOPY;  Service: Cardiovascular;  Laterality: N/A;  3D TEE/ patient will be admitted the day before test , therefo patient will be a inpatient the day of TEE   TOTAL KNEE ARTHROPLASTY Left 02/15/2012   Procedure: TOTAL KNEE ARTHROPLASTY;  Surgeon: Alta Corning, MD;  Location: [facility];  Service: Orthopedics;  Laterality: Left;    REVIEW OF SYSTEMS:  A comprehensive review of systems was negative except for: Constitutional: positive for fatigue   PHYSICAL EXAMINATION: General appearance: alert, cooperative, fatigued, and no distress Head: Normocephalic, without obvious abnormality, atraumatic Neck: no adenopathy, no JVD, supple, symmetrical, trachea midline, and thyroid not enlarged, symmetric, no tenderness/mass/nodules Lymph nodes: Cervical, supraclavicular, and axillary nodes normal. Resp: clear to auscultation bilaterally Back: symmetric, no curvature. ROM normal. No CVA tenderness. Cardio: regular rate and rhythm, S1, S2 normal, no murmur, click, rub or gallop GI: soft, non-tender; bowel sounds normal; no masses,  no organomegaly Extremities: extremities normal, atraumatic, no cyanosis or edema  ECOG PERFORMANCE STATUS: 1 - Symptomatic but completely  ambulatory  Blood pressure (!) 181/53, pulse 79, temperature 97.7 F (36.5 C), temperature source Oral, resp. rate 18, weight 174 lb 3.2 oz (79 kg), SpO2 95 %.  LABORATORY DATA: Lab Results  Component Value Date   WBC 6.3 07/13/2021   HGB 11.0 (L) 07/13/2021   HCT 32.2 (L) 07/13/2021   MCV 98.5 07/13/2021   PLT 154 07/13/2021      Chemistry      Component Value Date/Time   NA 141 08/17/2020 1119   K 4.9 08/17/2020 1119   CL 110 08/17/2020 1119   CO2 20 (L) 08/17/2020 1119   BUN 45 (H) 08/17/2020 1119   CREATININE 1.86 (H) 08/17/2020 1119      Component Value Date/Time   CALCIUM 9.6 08/17/2020 1119   ALKPHOS 116 08/17/2020 1119   AST 16 08/17/2020 1119   ALT 8 08/17/2020 1119   BILITOT 0.4 08/17/2020 1119       RADIOGRAPHIC STUDIES: No results found.  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 The patient was treated with iron infusion with Venofer 300 mg IV weekly for 3 weeks and he tolerated his infusion well.  His last infusion was in September 2022. The patient is currently on observation and he is feeling fine with no concerning complaints. Repeat CBC today showed stable hemoglobin of 11.0 and hematocrit 32.2% with normal white blood count and platelets count.  Iron study and ferritin are still pending. I recommended for the patient to continue on observation with repeat CBC, iron study and ferritin in 6 months.  If the pending iron studies showed significant deficiency, I will arrange for the patient to receive iron infusion. For the hypertension he was advised to take his blood pressure medication and to monitor it closely at home. The patient was advised to call immediately if he has any other concerning symptoms in the interval. The patient voices understanding of current disease status  and treatment options and is in agreement with the current care plan.  All questions were answered. The patient knows to call the clinic with any problems, questions or concerns. We can certainly see the patient much sooner if necessary.  The total time spent in the appointment was 20 minutes.  Disclaimer: This note was dictated with voice recognition software. Similar sounding words can inadvertently be transcribed and may not be corrected upon review.

## 2021-07-19 DIAGNOSIS — I872 Venous insufficiency (chronic) (peripheral): Secondary | ICD-10-CM | POA: Diagnosis not present

## 2021-07-25 ENCOUNTER — Telehealth: Payer: Self-pay | Admitting: *Deleted

## 2021-07-25 NOTE — Patient Outreach (Signed)
[facility]) Care Management  07/25/2021  CHAVEZ ROSOL III 07-Apr-1925 [id number]   Care Management   Outreach Note  07/25/2021 Name: DURELL LOFASO MRN: [id number] DOB: 02-27-1925  Successful contact was made with the patient to discuss care management and care coordination services. Patient declines engagement at this time.   Follow Up Plan:  No further follow up required: Pt will request services through PCP if he determines he has needs and desires to participate.  Eduard Clos MSW, LCSW Licensed Clinical Social Worker Care [employer]    [phone]

## 2021-08-02 DIAGNOSIS — I872 Venous insufficiency (chronic) (peripheral): Secondary | ICD-10-CM | POA: Diagnosis not present

## 2021-08-09 DIAGNOSIS — D500 Iron deficiency anemia secondary to blood loss (chronic): Secondary | ICD-10-CM | POA: Diagnosis not present

## 2021-08-14 ENCOUNTER — Telehealth: Payer: Self-pay

## 2021-08-14 NOTE — Telephone Encounter (Signed)
Pt called stating he had lab work done yesterday at his PCP office and wants Dr. Julien Nordmann to review the results to determine if he needs to be seen sooner than January 2024 given his hgb is 10.4.  I have called Dr. Minerva Fester office and requested these results.  I have advised the pt that once the results have been received, I will place them on Dr. Candie Chroman desk for review and if there is any feedback or action Dr. Julien Nordmann wishes to take, we will call him back but if not, then we will not. Pt was agreeable to this plan.

## 2021-08-16 DIAGNOSIS — S91301A Unspecified open wound, right foot, initial encounter: Secondary | ICD-10-CM | POA: Diagnosis not present

## 2021-08-22 DIAGNOSIS — D485 Neoplasm of uncertain behavior of skin: Secondary | ICD-10-CM | POA: Diagnosis not present

## 2021-08-22 DIAGNOSIS — C4442 Squamous cell carcinoma of skin of scalp and neck: Secondary | ICD-10-CM | POA: Diagnosis not present

## 2021-09-13 DIAGNOSIS — I700 Atherosclerosis of aorta: Secondary | ICD-10-CM | POA: Diagnosis not present

## 2021-09-13 DIAGNOSIS — I129 Hypertensive chronic kidney disease with stage 1 through stage 4 chronic kidney disease, or unspecified chronic kidney disease: Secondary | ICD-10-CM | POA: Diagnosis not present

## 2021-09-13 DIAGNOSIS — D500 Iron deficiency anemia secondary to blood loss (chronic): Secondary | ICD-10-CM | POA: Diagnosis not present

## 2021-09-13 DIAGNOSIS — R7303 Prediabetes: Secondary | ICD-10-CM | POA: Diagnosis not present

## 2021-09-13 DIAGNOSIS — I2510 Atherosclerotic heart disease of native coronary artery without angina pectoris: Secondary | ICD-10-CM | POA: Diagnosis not present

## 2021-09-15 DIAGNOSIS — S91301A Unspecified open wound, right foot, initial encounter: Secondary | ICD-10-CM | POA: Diagnosis not present

## 2021-09-15 DIAGNOSIS — C4442 Squamous cell carcinoma of skin of scalp and neck: Secondary | ICD-10-CM | POA: Diagnosis not present

## 2021-09-27 ENCOUNTER — Encounter: Payer: Self-pay | Admitting: Internal Medicine

## 2021-09-29 DIAGNOSIS — Z4801 Encounter for change or removal of surgical wound dressing: Secondary | ICD-10-CM | POA: Diagnosis not present

## 2021-09-29 DIAGNOSIS — Z48817 Encounter for surgical aftercare following surgery on the skin and subcutaneous tissue: Secondary | ICD-10-CM | POA: Diagnosis not present

## 2021-09-29 DIAGNOSIS — S91301A Unspecified open wound, right foot, initial encounter: Secondary | ICD-10-CM | POA: Diagnosis not present

## 2021-10-11 DIAGNOSIS — L821 Other seborrheic keratosis: Secondary | ICD-10-CM | POA: Diagnosis not present

## 2021-10-11 DIAGNOSIS — I872 Venous insufficiency (chronic) (peripheral): Secondary | ICD-10-CM | POA: Diagnosis not present

## 2021-10-11 DIAGNOSIS — L814 Other melanin hyperpigmentation: Secondary | ICD-10-CM | POA: Diagnosis not present

## 2021-10-11 DIAGNOSIS — D1801 Hemangioma of skin and subcutaneous tissue: Secondary | ICD-10-CM | POA: Diagnosis not present

## 2021-10-16 DIAGNOSIS — S91301A Unspecified open wound, right foot, initial encounter: Secondary | ICD-10-CM | POA: Diagnosis not present

## 2021-10-18 DIAGNOSIS — I129 Hypertensive chronic kidney disease with stage 1 through stage 4 chronic kidney disease, or unspecified chronic kidney disease: Secondary | ICD-10-CM | POA: Diagnosis not present

## 2021-10-18 DIAGNOSIS — I700 Atherosclerosis of aorta: Secondary | ICD-10-CM | POA: Diagnosis not present

## 2021-10-18 DIAGNOSIS — I2510 Atherosclerotic heart disease of native coronary artery without angina pectoris: Secondary | ICD-10-CM | POA: Diagnosis not present

## 2021-10-18 DIAGNOSIS — D500 Iron deficiency anemia secondary to blood loss (chronic): Secondary | ICD-10-CM | POA: Diagnosis not present

## 2021-10-18 DIAGNOSIS — R7303 Prediabetes: Secondary | ICD-10-CM | POA: Diagnosis not present

## 2021-10-20 DIAGNOSIS — E782 Mixed hyperlipidemia: Secondary | ICD-10-CM | POA: Diagnosis not present

## 2021-10-20 DIAGNOSIS — I700 Atherosclerosis of aorta: Secondary | ICD-10-CM | POA: Diagnosis not present

## 2021-10-20 DIAGNOSIS — I2510 Atherosclerotic heart disease of native coronary artery without angina pectoris: Secondary | ICD-10-CM | POA: Diagnosis not present

## 2021-10-20 DIAGNOSIS — N400 Enlarged prostate without lower urinary tract symptoms: Secondary | ICD-10-CM | POA: Diagnosis not present

## 2021-10-20 DIAGNOSIS — I129 Hypertensive chronic kidney disease with stage 1 through stage 4 chronic kidney disease, or unspecified chronic kidney disease: Secondary | ICD-10-CM | POA: Diagnosis not present

## 2021-10-20 DIAGNOSIS — D500 Iron deficiency anemia secondary to blood loss (chronic): Secondary | ICD-10-CM | POA: Diagnosis not present

## 2021-10-20 DIAGNOSIS — M150 Primary generalized (osteo)arthritis: Secondary | ICD-10-CM | POA: Diagnosis not present

## 2021-10-20 DIAGNOSIS — Z23 Encounter for immunization: Secondary | ICD-10-CM | POA: Diagnosis not present

## 2021-10-20 DIAGNOSIS — N1832 Chronic kidney disease, stage 3b: Secondary | ICD-10-CM | POA: Diagnosis not present

## 2021-10-20 DIAGNOSIS — I6521 Occlusion and stenosis of right carotid artery: Secondary | ICD-10-CM | POA: Diagnosis not present

## 2021-10-20 DIAGNOSIS — I495 Sick sinus syndrome: Secondary | ICD-10-CM | POA: Diagnosis not present

## 2021-10-20 DIAGNOSIS — R7303 Prediabetes: Secondary | ICD-10-CM | POA: Diagnosis not present

## 2021-11-14 DIAGNOSIS — D485 Neoplasm of uncertain behavior of skin: Secondary | ICD-10-CM | POA: Diagnosis not present

## 2021-11-14 DIAGNOSIS — S91301A Unspecified open wound, right foot, initial encounter: Secondary | ICD-10-CM | POA: Diagnosis not present

## 2021-11-14 DIAGNOSIS — I872 Venous insufficiency (chronic) (peripheral): Secondary | ICD-10-CM | POA: Diagnosis not present

## 2021-11-14 DIAGNOSIS — C44629 Squamous cell carcinoma of skin of left upper limb, including shoulder: Secondary | ICD-10-CM | POA: Diagnosis not present

## 2021-11-14 DIAGNOSIS — L810 Postinflammatory hyperpigmentation: Secondary | ICD-10-CM | POA: Diagnosis not present

## 2021-11-14 DIAGNOSIS — L570 Actinic keratosis: Secondary | ICD-10-CM | POA: Diagnosis not present

## 2021-11-15 DIAGNOSIS — S91301A Unspecified open wound, right foot, initial encounter: Secondary | ICD-10-CM | POA: Diagnosis not present

## 2021-12-12 DIAGNOSIS — S91301A Unspecified open wound, right foot, initial encounter: Secondary | ICD-10-CM | POA: Diagnosis not present

## 2021-12-12 DIAGNOSIS — Z4802 Encounter for removal of sutures: Secondary | ICD-10-CM | POA: Diagnosis not present

## 2021-12-13 DIAGNOSIS — D500 Iron deficiency anemia secondary to blood loss (chronic): Secondary | ICD-10-CM | POA: Diagnosis not present

## 2022-01-02 ENCOUNTER — Telehealth: Payer: Self-pay | Admitting: Medical Oncology

## 2022-01-02 NOTE — Telephone Encounter (Signed)
I returned pt call as to why he is seeing Cassie on 1/11  and not Mohamed.  Linle was busy.

## 2022-01-04 ENCOUNTER — Telehealth: Payer: Self-pay | Admitting: Internal Medicine

## 2022-01-04 NOTE — Telephone Encounter (Signed)
Called patient regarding January appointment, patient is notified. 

## 2022-01-05 DIAGNOSIS — R350 Frequency of micturition: Secondary | ICD-10-CM | POA: Diagnosis not present

## 2022-01-10 NOTE — Progress Notes (Unsigned)
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   PRIOR THERAPY:  CURRENT THERAPY:  INTERVAL HISTORY: [name] 89 y.o. male returns to clinic today for a 62-monthfollow-up visit.  The patient was last seen by Dr. MJulien Nordmannon 07/13/2021.  Patient is followed for iron deficiency anemia secondary to AV malformations and chronic kidney disease.  His most recent IV iron infusion was on 09/23/2020.   He is compliant with her oral iron supplements???Is he taking???.  Overall his energy is ***. He denies any lightheadedness, dyspnea on exertion, palpitations, or chest pain.  Pallor?  Denies any abnormal bleeding or bruising including epistaxis, gingival bleeding, hemoptysis, hematemesis, melena, or hematochezia.  U2d on colonoscoy reportedly. Stool cards from when initially referred here negative for blood. He denies craving ice [place]?  She is here today for evaluation repeat blood work.   MEDICAL HISTORY: Past Medical History:  Diagnosis Date   Anemia    low hemoglobin   Arthritis    AV (angiodysplasia malformation of colon)    Benign prostatic hypertrophy    Cancer (HCC)    basal cell cancer   Chronic kidney disease    elevated Cr, BUN, Dr. FHassell Donemanages   COPD (chronic obstructive pulmonary disease) ([place]    Coronary artery disease    GERD (gastroesophageal reflux disease)    Gout    Heart murmur    History of kidney stones    Hyperlipidemia    Hypertension    Influenza    February 2019   Mitral regurgitation    Mitral valve prolapse    Pneumonia 1970's   PONV (postoperative nausea and vomiting)    1st knee replacement   Shortness of breath     ALLERGIES:  has No Known Allergies.  MEDICATIONS:  Current Outpatient Medications  Medication  Sig Dispense Refill   acetaminophen (TYLENOL) 325 MG tablet Take 650 mg by mouth every 6 (six) hours as needed for mild pain or headache.     allopurinol (ZYLOPRIM) 300 MG tablet Take 150 mg by mouth daily.      aspirin EC 81 MG tablet Take 81 mg by mouth 2 (two) times a week. Swallow whole.     atorvastatin (LIPITOR) 10 MG tablet TAKE 1 TABLET (10 MG TOTAL) BY MOUTH DAILY AT 6 PM. 90 tablet 3   diltiazem (CARDIZEM CD) 300 MG 24 hr capsule Take 300 mg by mouth daily.     lisinopril (ZESTRIL) 20 MG tablet Take 20 mg by mouth daily.     omeprazole (PRILOSEC OTC) 20 MG tablet Take 20 mg by mouth every other day.     polyethylene glycol (MIRALAX / GLYCOLAX) packet Take 17 g by mouth daily as needed for moderate constipation.      No current facility-administered medications for this visit.    SURGICAL HISTORY:  Past Surgical History:  Procedure Laterality Date   ABDOMINAL ANGIOGRAM  04/30/2011   Procedure: ABDOMINAL ANGIOGRAM;  Surgeon: JLorretta Harp MD;  [facility];  Service: Cardiovascular;;   CARDIAC CATHETERIZATION  04/2011   x2   CARDIAC CATHETERIZATION  08/2009   CATARACT EXTRACTION  2011   bilateral   COLONOSCOPY  05/04/2011   Procedure: COLONOSCOPY;  Surgeon: PBeryle Beams MD;  [facility];  Service: Endoscopy;  Laterality: N/A;   COLONOSCOPY N/A 07/16/2013   Procedure: COLONOSCOPY;  Surgeon: Beryle Beams, MD;  Location: [place];  Service: Endoscopy;  Laterality: N/A;   COLONOSCOPY WITH PROPOFOL N/A 06/20/2017   Procedure: COLONOSCOPY WITH PROPOFOL;  Surgeon: Carol Ada, MD;  [facility];  Service: Endoscopy;  Laterality: N/A;   CORONARY ANGIOGRAM  04/30/2011   Procedure: CORONARY ANGIOGRAM;  Surgeon: Lorretta Harp, MD;  Location: [facility] CATH LAB;  Service: Cardiovascular;;   ESOPHAGOGASTRODUODENOSCOPY  05/04/2011   Procedure: ESOPHAGOGASTRODUODENOSCOPY (EGD);  Surgeon: Beryle Beams, MD;  Location: [facility];  Service: Endoscopy;   Laterality: N/A;   EYE SURGERY     bilateral cataracts removed   HERNIA REPAIR  1999&2000   bilateral   LOBECTOMY  1980   left lower lobectomy for benign tumor   OPEN REDUCTION INTERNAL FIXATION (ORIF) DISTAL RADIAL FRACTURE Left 03/19/2017   Procedure: OPEN REDUCTION INTERNAL FIXATION (ORIF) DISTAL RADIAL FRACTURE;  Surgeon: Dorna Leitz, MD;  Location: [place];  Service: Orthopedics;  Laterality: Left;  AXILLARY BLOCK   POLYPECTOMY  06/20/2017   Procedure: POLYPECTOMY;  Surgeon: Carol Ada, MD;  [facility]OS[facility];  Service: Endoscopy;;   REPLACEMENT TOTAL KNEE  2005   right   RIGHT HEART CATHETERIZATION  04/30/2011   Procedure: RIGHT HEART CATH;  Surgeon: Lorretta Harp, MD;  [facility] C[facility] LAB;  Service: Cardiovascular;;   TEE WITHOUT CARDIOVERSION  04/30/2011   Procedure: TRANSESOPHAGEAL ECHOCARDIOGRAM (TEE);  Surgeon: Sanda Klein, MD;  Location: [facility] ENDOSCOPY;  Service: Cardiovascular;  Laterality: N/A;  3D TEE/ patient will be admitted the day before test , therefo patient will be a inpatient the day of TEE   TOTAL KNEE ARTHROPLASTY Left 02/15/2012   Procedure: TOTAL KNEE ARTHROPLASTY;  Surgeon: Alta Corning, MD;  Location: [place];  Service: Orthopedics;  Laterality: Left;    REVIEW OF SYSTEMS:   Review of Systems  Constitutional: Negative for appetite change, chills, fatigue, fever and unexpected weight change.  HENT:   Negative for mouth sores, nosebleeds, sore throat and trouble swallowing.   Eyes: Negative for eye problems and icterus.  Respiratory: Negative for cough, hemoptysis, shortness of breath and wheezing.   Cardiovascular: Negative for chest pain and leg swelling.  Gastrointestinal: Negative for abdominal pain, constipation, diarrhea, nausea and vomiting.  Genitourinary: Negative for bladder incontinence, difficulty urinating, dysuria, frequency and hematuria.   Musculoskeletal: Negative for back pain, gait problem, neck pain and neck stiffness.  Skin:  Negative for itching and rash.  Neurological: Negative for dizziness, extremity weakness, gait problem, headaches, light-headedness and seizures.  Hematological: Negative for adenopathy. Does not bruise/bleed easily.  Psychiatric/Behavioral: Negative for confusion, depression and sleep disturbance. The patient is not nervous/anxious.     PHYSICAL EXAMINATION:  There were no vitals taken for this visit.  ECOG PERFORMANCE STATUS: {CHL ONC ECOG [id number]  Physical Exam  Constitutional: Oriented to person, place, and time and well-developed, well-nourished, and in no distress. No distress.  HENT:  Head: Normocephalic and atraumatic.  Mouth/Throat: Oropharynx is clear and moist. No oropharyngeal exudate.  Eyes: Conjunctivae are normal. Right eye exhibits no discharge. Left eye exhibits no discharge. No scleral icterus.  Neck: Normal range of motion. Neck supple.  Cardiovascular: Normal rate, regular rhythm, normal heart sounds and intact distal pulses.   Pulmonary/Chest: Effort normal and breath sounds normal. No respiratory distress. No wheezes. No rales.  Abdominal: Soft. Bowel sounds are normal. Exhibits no distension and no mass. There is no tenderness.  Musculoskeletal: Normal range of motion. Exhibits no edema.  Lymphadenopathy:    No cervical adenopathy.  Neurological: Alert and oriented to person, place, and time. Exhibits normal muscle tone. Gait normal. Coordination normal.  Skin: Skin is warm and dry. No rash noted. Not diaphoretic. No erythema. No pallor.  Psychiatric: Mood, memory and judgment normal.  Vitals reviewed.  LABORATORY DATA: Lab Results  Component Value Date   WBC 6.3 07/13/2021   HGB 11.0 (L) 07/13/2021   HCT 32.2 (L) 07/13/2021   MCV 98.5 07/13/2021   PLT 154 07/13/2021      Chemistry      Component Value Date/Time   NA 141 08/17/2020 1119   K 4.9 08/17/2020 1119   CL 110 08/17/2020 1119   CO2 20 (L) 08/17/2020 1119   BUN 45 (H) 08/17/2020 1119    CREATININE 1.86 (H) 08/17/2020 1119      Component Value Date/Time   CALCIUM 9.6 08/17/2020 1119   ALKPHOS 116 08/17/2020 1119   AST 16 08/17/2020 1119   ALT 8 08/17/2020 1119   BILITOT 0.4 08/17/2020 1119       RADIOGRAPHIC STUDIES:  No results found.   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   The patient was treated with iron infusion with Venofer 300 mg IV weekly for 3 weeks and he tolerated his infusion well.  His last infusion was in September 23, 2020.  The patient was seen with Dr. Julien Nordmann today.  Patient repeat CBC, iron studies, ferritin.  His labs today show.  I will arrange for IV iron with Venofer 300 mg weekly x 3 at the [facility].  See him back for follow-up visit in 2 months for evaluation and repeat blood work.  Supplement?   The patient was advised to call immediately if he has any concerning symptoms in the interval. The patient voices understanding of current disease status and treatment options and is in agreement with the current care plan. All questions were answered. The patient knows to call the clinic with any problems, questions or concerns. We can certainly see the patient much sooner if necessary    No orders of the defined types were placed in this encounter.    I spent {CHL ONC TIME VISIT - STMHD:[id number]} counseling the patient face to face. The total time spent in the appointment was {CHL ONC TIME VISIT - JJHER:[id number]}.  Rodrigus Kilker L Fraida Veldman, PA-C 01/10/22

## 2022-01-11 ENCOUNTER — Other Ambulatory Visit: Payer: Self-pay

## 2022-01-11 ENCOUNTER — Ambulatory Visit: Payer: PPO | Admitting: Internal Medicine

## 2022-01-11 ENCOUNTER — Inpatient Hospital Stay: Payer: PPO | Attending: Physician Assistant

## 2022-01-11 ENCOUNTER — Other Ambulatory Visit: Payer: PPO

## 2022-01-11 ENCOUNTER — Inpatient Hospital Stay (HOSPITAL_BASED_OUTPATIENT_CLINIC_OR_DEPARTMENT_OTHER): Payer: PPO | Admitting: Physician Assistant

## 2022-01-11 VITALS — BP 178/62 | HR 68 | Temp 97.5°F | Resp 14 | Wt 171.3 lb

## 2022-01-11 DIAGNOSIS — N189 Chronic kidney disease, unspecified: Secondary | ICD-10-CM | POA: Insufficient documentation

## 2022-01-11 DIAGNOSIS — K5520 Angiodysplasia of colon without hemorrhage: Secondary | ICD-10-CM | POA: Insufficient documentation

## 2022-01-11 DIAGNOSIS — D508 Other iron deficiency anemias: Secondary | ICD-10-CM | POA: Insufficient documentation

## 2022-01-11 DIAGNOSIS — D509 Iron deficiency anemia, unspecified: Secondary | ICD-10-CM | POA: Diagnosis not present

## 2022-01-11 DIAGNOSIS — D500 Iron deficiency anemia secondary to blood loss (chronic): Secondary | ICD-10-CM

## 2022-01-11 LAB — CBC WITH DIFFERENTIAL (CANCER CENTER ONLY)
Abs Immature Granulocytes: 0.06 10*3/uL (ref 0.00–0.07)
Basophils Absolute: 0 10*3/uL (ref 0.0–0.1)
Basophils Relative: 1 %
Eosinophils Absolute: 0.3 10*3/uL (ref 0.0–0.5)
Eosinophils Relative: 4 %
HCT: 28.7 % — ABNORMAL LOW (ref 39.0–52.0)
Hemoglobin: 9.5 g/dL — ABNORMAL LOW (ref 13.0–17.0)
Immature Granulocytes: 1 %
Lymphocytes Relative: 19 %
Lymphs Abs: 1.2 10*3/uL (ref 0.7–4.0)
MCH: 31.5 pg (ref 26.0–34.0)
MCHC: 33.1 g/dL (ref 30.0–36.0)
MCV: 95 fL (ref 80.0–100.0)
Monocytes Absolute: 0.8 10*3/uL (ref 0.1–1.0)
Monocytes Relative: 12 %
Neutro Abs: 4.1 10*3/uL (ref 1.7–7.7)
Neutrophils Relative %: 63 %
Platelet Count: 162 10*3/uL (ref 150–400)
RBC: 3.02 MIL/uL — ABNORMAL LOW (ref 4.22–5.81)
RDW: 15 % (ref 11.5–15.5)
WBC Count: 6.5 10*3/uL (ref 4.0–10.5)
nRBC: 0 % (ref 0.0–0.2)

## 2022-01-11 LAB — IRON AND IRON BINDING CAPACITY (CC-WL,HP ONLY)
Iron: 54 ug/dL (ref 45–182)
Saturation Ratios: 15 % — ABNORMAL LOW (ref 17.9–39.5)
TIBC: 357 ug/dL (ref 250–450)
UIBC: 303 ug/dL (ref 117–376)

## 2022-01-11 LAB — FERRITIN: Ferritin: 46 ng/mL (ref 24–336)

## 2022-01-12 ENCOUNTER — Telehealth: Payer: Self-pay | Admitting: Physician Assistant

## 2022-01-12 ENCOUNTER — Other Ambulatory Visit: Payer: Self-pay | Admitting: Physician Assistant

## 2022-01-12 NOTE — Telephone Encounter (Signed)
I reviewed his iron studies with Dr. Julien Nordmann.His iron studies were not very low. However, given the worsening anemia, we will go ahead and proceed with I iron infusion which are scheduled for next week. He gets his labs checked monthly at his PCPs office. He is going to call us sooner if his Hbg continues to downtrend. We will keep his appointment in May as scheduled but can see him sooner if needed.

## 2022-01-12 NOTE — Progress Notes (Signed)
Will proceed with iron infusions.

## 2022-01-15 ENCOUNTER — Telehealth: Payer: Self-pay | Admitting: Internal Medicine

## 2022-01-15 NOTE — Telephone Encounter (Signed)
Called patient regarding upcoming January-May appointments, patient is notified.

## 2022-01-17 ENCOUNTER — Telehealth: Payer: Self-pay | Admitting: Medical Oncology

## 2022-01-17 NOTE — Telephone Encounter (Signed)
Pt notified that there is no interaction between Ciprofloxin oral and iv Iron sucrose. ( Web MD).

## 2022-01-17 NOTE — Telephone Encounter (Signed)
UTI -pt on Cipro for the same. Does it interfere with  iron infusion scheduled for Friday.

## 2022-01-18 DIAGNOSIS — D485 Neoplasm of uncertain behavior of skin: Secondary | ICD-10-CM | POA: Diagnosis not present

## 2022-01-18 DIAGNOSIS — D0462 Carcinoma in situ of skin of left upper limb, including shoulder: Secondary | ICD-10-CM | POA: Diagnosis not present

## 2022-01-18 DIAGNOSIS — C44629 Squamous cell carcinoma of skin of left upper limb, including shoulder: Secondary | ICD-10-CM | POA: Diagnosis not present

## 2022-01-19 ENCOUNTER — Other Ambulatory Visit: Payer: Self-pay

## 2022-01-19 ENCOUNTER — Inpatient Hospital Stay: Payer: PPO

## 2022-01-19 VITALS — BP 140/59 | HR 55 | Temp 98.1°F | Resp 17

## 2022-01-19 DIAGNOSIS — D649 Anemia, unspecified: Secondary | ICD-10-CM

## 2022-01-19 DIAGNOSIS — D508 Other iron deficiency anemias: Secondary | ICD-10-CM | POA: Diagnosis not present

## 2022-01-19 MED ORDER — SODIUM CHLORIDE 0.9 % IV SOLN: Freq: Once | INTRAVENOUS | Status: AC

## 2022-01-19 MED ORDER — SODIUM CHLORIDE 0.9 % IV SOLN
300.0000 mg | Freq: Once | INTRAVENOUS | Status: AC
  Administered 2022-01-19: 300 mg via INTRAVENOUS
  Filled 2022-01-19: qty 200

## 2022-01-19 MED ORDER — LORATADINE 10 MG PO TABS
10.0000 mg | ORAL_TABLET | Freq: Once | ORAL | Status: AC
  Administered 2022-01-19: 10 mg via ORAL
  Filled 2022-01-19: qty 1

## 2022-01-19 NOTE — Patient Instructions (Signed)

## 2022-01-26 ENCOUNTER — Other Ambulatory Visit: Payer: Self-pay

## 2022-01-26 ENCOUNTER — Inpatient Hospital Stay: Payer: PPO

## 2022-01-26 VITALS — BP 153/57 | HR 56 | Temp 97.7°F | Resp 18

## 2022-01-26 DIAGNOSIS — D508 Other iron deficiency anemias: Secondary | ICD-10-CM | POA: Diagnosis not present

## 2022-01-26 DIAGNOSIS — D649 Anemia, unspecified: Secondary | ICD-10-CM

## 2022-01-26 MED ORDER — LORATADINE 10 MG PO TABS
10.0000 mg | ORAL_TABLET | Freq: Once | ORAL | Status: AC
  Administered 2022-01-26: 10 mg via ORAL
  Filled 2022-01-26: qty 1

## 2022-01-26 MED ORDER — SODIUM CHLORIDE 0.9 % IV SOLN: Freq: Once | INTRAVENOUS | Status: AC

## 2022-01-26 MED ORDER — SODIUM CHLORIDE 0.9 % IV SOLN
300.0000 mg | Freq: Once | INTRAVENOUS | Status: AC
  Administered 2022-01-26: 300 mg via INTRAVENOUS
  Filled 2022-01-26: qty 300

## 2022-01-26 NOTE — Patient Instructions (Signed)

## 2022-02-02 ENCOUNTER — Inpatient Hospital Stay: Payer: PPO | Attending: Physician Assistant

## 2022-02-02 ENCOUNTER — Other Ambulatory Visit: Payer: Self-pay

## 2022-02-02 VITALS — BP 159/59 | HR 57 | Temp 97.7°F | Resp 16

## 2022-02-02 DIAGNOSIS — E611 Iron deficiency: Secondary | ICD-10-CM | POA: Insufficient documentation

## 2022-02-02 DIAGNOSIS — D649 Anemia, unspecified: Secondary | ICD-10-CM

## 2022-02-02 MED ORDER — SODIUM CHLORIDE 0.9 % IV SOLN: Freq: Once | INTRAVENOUS | Status: AC

## 2022-02-02 MED ORDER — SODIUM CHLORIDE 0.9 % IV SOLN
300.0000 mg | Freq: Once | INTRAVENOUS | Status: AC
  Administered 2022-02-02: 300 mg via INTRAVENOUS
  Filled 2022-02-02: qty 300

## 2022-02-02 MED ORDER — LORATADINE 10 MG PO TABS
10.0000 mg | ORAL_TABLET | Freq: Once | ORAL | Status: AC
  Administered 2022-02-02: 10 mg via ORAL
  Filled 2022-02-02: qty 1

## 2022-02-02 NOTE — Patient Instructions (Signed)

## 2022-02-02 NOTE — Progress Notes (Signed)
Patient declined to stay for 30 minute post-iron observation period, tolerated infusion well. Vitals stable at discharge. Patient ambulated to lobby with wife.

## 2022-02-16 DIAGNOSIS — D500 Iron deficiency anemia secondary to blood loss (chronic): Secondary | ICD-10-CM | POA: Diagnosis not present

## 2022-02-16 DIAGNOSIS — I700 Atherosclerosis of aorta: Secondary | ICD-10-CM | POA: Diagnosis not present

## 2022-02-16 DIAGNOSIS — I2510 Atherosclerotic heart disease of native coronary artery without angina pectoris: Secondary | ICD-10-CM | POA: Diagnosis not present

## 2022-02-16 DIAGNOSIS — N1832 Chronic kidney disease, stage 3b: Secondary | ICD-10-CM | POA: Diagnosis not present

## 2022-02-16 DIAGNOSIS — R7303 Prediabetes: Secondary | ICD-10-CM | POA: Diagnosis not present

## 2022-02-16 DIAGNOSIS — E782 Mixed hyperlipidemia: Secondary | ICD-10-CM | POA: Diagnosis not present

## 2022-02-16 DIAGNOSIS — R5383 Other fatigue: Secondary | ICD-10-CM | POA: Diagnosis not present

## 2022-02-16 DIAGNOSIS — I129 Hypertensive chronic kidney disease with stage 1 through stage 4 chronic kidney disease, or unspecified chronic kidney disease: Secondary | ICD-10-CM | POA: Diagnosis not present

## 2022-02-20 ENCOUNTER — Encounter: Payer: Self-pay | Admitting: Internal Medicine

## 2022-02-20 DIAGNOSIS — R490 Dysphonia: Secondary | ICD-10-CM | POA: Diagnosis not present

## 2022-02-20 DIAGNOSIS — R0602 Shortness of breath: Secondary | ICD-10-CM | POA: Diagnosis not present

## 2022-02-20 DIAGNOSIS — R059 Cough, unspecified: Secondary | ICD-10-CM | POA: Diagnosis not present

## 2022-02-21 ENCOUNTER — Encounter: Payer: Self-pay | Admitting: Internal Medicine

## 2022-02-21 ENCOUNTER — Telehealth: Payer: Self-pay | Admitting: Cardiovascular Disease

## 2022-02-21 NOTE — Telephone Encounter (Signed)
Spoke to  [place] - she states patient office visit and labs have been faxed for Dr Gwenlyn Found to review.  Dr Shelia Media would like for Dr Gwenlyn Found to see patient. BNP  was 1276 . Patient was started on Lasix 20 mg.   Per nurse, Izora Gala  - patient should be calling in today to give them his weight. Patient has a follow up visit next Friday  , March 02, 2022 with Dr Shelia Media   Will give information to Dr Gwenlyn Found to review

## 2022-02-21 NOTE — Telephone Encounter (Signed)
[facility] is calling to report abnormal lab results

## 2022-02-21 NOTE — Telephone Encounter (Signed)
Spoke to patient .  Patient aware  Dr Ashby Dawes office called and wanted him to be seen by cardiology. appointment March 07, 2022 at 10:55. Patient verbalized understanding.

## 2022-03-02 DIAGNOSIS — Z0000 Encounter for general adult medical examination without abnormal findings: Secondary | ICD-10-CM | POA: Diagnosis not present

## 2022-03-02 DIAGNOSIS — R410 Disorientation, unspecified: Secondary | ICD-10-CM | POA: Diagnosis not present

## 2022-03-02 DIAGNOSIS — I700 Atherosclerosis of aorta: Secondary | ICD-10-CM | POA: Diagnosis not present

## 2022-03-02 DIAGNOSIS — R7303 Prediabetes: Secondary | ICD-10-CM | POA: Diagnosis not present

## 2022-03-02 DIAGNOSIS — I495 Sick sinus syndrome: Secondary | ICD-10-CM | POA: Diagnosis not present

## 2022-03-02 DIAGNOSIS — N1832 Chronic kidney disease, stage 3b: Secondary | ICD-10-CM | POA: Diagnosis not present

## 2022-03-02 DIAGNOSIS — E782 Mixed hyperlipidemia: Secondary | ICD-10-CM | POA: Diagnosis not present

## 2022-03-02 DIAGNOSIS — I6521 Occlusion and stenosis of right carotid artery: Secondary | ICD-10-CM | POA: Diagnosis not present

## 2022-03-02 DIAGNOSIS — M150 Primary generalized (osteo)arthritis: Secondary | ICD-10-CM | POA: Diagnosis not present

## 2022-03-02 DIAGNOSIS — I5042 Chronic combined systolic (congestive) and diastolic (congestive) heart failure: Secondary | ICD-10-CM | POA: Diagnosis not present

## 2022-03-02 DIAGNOSIS — D500 Iron deficiency anemia secondary to blood loss (chronic): Secondary | ICD-10-CM | POA: Diagnosis not present

## 2022-03-02 DIAGNOSIS — I2510 Atherosclerotic heart disease of native coronary artery without angina pectoris: Secondary | ICD-10-CM | POA: Diagnosis not present

## 2022-03-06 ENCOUNTER — Telehealth: Payer: Self-pay | Admitting: Cardiovascular Disease

## 2022-03-06 DIAGNOSIS — E872 Acidosis, unspecified: Secondary | ICD-10-CM | POA: Diagnosis not present

## 2022-03-06 DIAGNOSIS — N183 Chronic kidney disease, stage 3 unspecified: Secondary | ICD-10-CM | POA: Diagnosis not present

## 2022-03-06 DIAGNOSIS — N400 Enlarged prostate without lower urinary tract symptoms: Secondary | ICD-10-CM | POA: Diagnosis not present

## 2022-03-06 DIAGNOSIS — I129 Hypertensive chronic kidney disease with stage 1 through stage 4 chronic kidney disease, or unspecified chronic kidney disease: Secondary | ICD-10-CM | POA: Diagnosis not present

## 2022-03-06 DIAGNOSIS — R809 Proteinuria, unspecified: Secondary | ICD-10-CM | POA: Diagnosis not present

## 2022-03-06 NOTE — Progress Notes (Unsigned)
Cardiology Office Note:    Date:  03/07/2022   ID:  [name], DOB [birth year], MRN [id number]  PCP:  [name], [place] Cardiologist: Quay Burow, MD   Reason for visit: leg swelling  History of Present Illness:    [name] is a 89 y.o. male with a hx of mild-moderate CAD by left heart cath 2013, mitral regurgitation, hypertension, hyperlipidemia, anemia secondary to AVMs.  He was last seen by Dr. Alvester Chou in March 2023.  He was doing well working out on the bicycle.  PCP requested appointment following BNP of 1276, leg swelling and weight gain of 8lbs.  Patient started on Lasix 20 mg daily.  He believes his dry weight is ~171 lbs.  His weight had gone up to 181 before starting lasix.  After starting lasix, lowest weight 168.  Weight on home scale today 173.    Patient recounts that his anemia had worsened in January with hemoglobin to 9.5.  He has had 3 iron infusions over January/February.  He denies any gross blood.  His leg swelling worsened around December.  He noticed a drop in his energy and some shortness of breath.  He also mentions 1 month ago, he was started on prednisone and cephalexin for right ear congestion.  He states he has 2 tablets of prednisone remaining.  He states he saw Dr. Joelyn Oms his kidney doctor yesterday.  He question if the patient was overmedicated.  He was told his kidney disease is stable.  Patient denies PND, orthopnea, bloating and chest pain.    Patient brings in blood pressure log with blood pressures 110s to 140s over 50s.    Past Medical History:  Diagnosis Date   Anemia    low hemoglobin   Arthritis    AV (angiodysplasia malformation of colon)    Benign prostatic hypertrophy    Cancer (HCC)    basal cell cancer   Chronic kidney disease    elevated Cr, BUN, Dr. Hassell Done manages   COPD (chronic obstructive pulmonary disease) ([place])    Coronary artery disease    GERD (gastroesophageal reflux disease)     Gout    Heart murmur    History of kidney stones    Hyperlipidemia    Hypertension    Influenza    February 2019   Mitral regurgitation    Mitral valve prolapse    Pneumonia 1970's   PONV (postoperative nausea and vomiting)    1st knee replacement   Shortness of breath     Past Surgical History:  Procedure Laterality Date   ABDOMINAL ANGIOGRAM  04/30/2011   Procedure: ABDOMINAL ANGIOGRAM;  Surgeon: Lorretta Harp, MD;  Location: [facility] CATH LAB;  Service: Cardiovascular;;   CARDIAC CATHETERIZATION  04/2011   x2   CARDIAC CATHETERIZATION  08/2009   CATARACT EXTRACTION  2011   bilateral   COLONOSCOPY  05/04/2011   Procedure: COLONOSCOPY;  Surgeon: Beryle Beams, MD;  Location: [facility] ENDOSCOPY;  Service: Endoscopy;  Laterality: N/A;   COLONOSCOPY N/A 07/16/2013   Procedure: COLONOSCOPY;  Surgeon: Beryle Beams, MD;  Location: [place];  Service: Endoscopy;  Laterality: N/A;   COLONOSCOPY WITH PROPOFOL N/A 06/20/2017   Procedure: COLONOSCOPY WITH PROPOFOL;  Surgeon: Carol Ada, MD;  Location: [facility] ENDOSCOPY;  Service: Endoscopy;  Laterality: N/A;   CORONARY ANGIOGRAM  04/30/2011   Procedure: CORONARY ANGIOGRAM;  Surgeon: Lorretta Harp, MD;  Location: [facility] CATH LAB;  Service: Cardiovascular;;  ESOPHAGOGASTRODUODENOSCOPY  05/04/2011   Procedure: ESOPHAGOGASTRODUODENOSCOPY (EGD);  Surgeon: Beryle Beams, MD;  Location: [facility] ENDOSCOPY;  Service: Endoscopy;  Laterality: N/A;   EYE SURGERY     bilateral cataracts removed   HERNIA REPAIR  1999&2000   bilateral   LOBECTOMY  1980   left lower lobectomy for benign tumor   OPEN REDUCTION INTERNAL FIXATION (ORIF) DISTAL RADIAL FRACTURE Left 03/19/2017   Procedure: OPEN REDUCTION INTERNAL FIXATION (ORIF) DISTAL RADIAL FRACTURE;  Surgeon: Dorna Leitz, MD;  Location: [place];  Service: Orthopedics;  Laterality: Left;  AXILLARY BLOCK   POLYPECTOMY  06/20/2017   Procedure: POLYPECTOMY;  Surgeon: Carol Ada, MD;  [facility]: [facility] ENDOS[facility];  Service:  Endoscopy;;   REPLACEMENT TOTAL KNEE  2005   right   RIGHT HEART CATHETERIZATION  04/30/2011   Procedure: RIGHT HEART CATH;  Surgeon: Lorretta Harp, MD;  Location: [facility] CATH LAB;  Service: Cardiovascular;;   TEE WITHOUT CARDIOVERSION  04/30/2011   Procedure: TRANSESOPHAGEAL ECHOCARDIOGRAM (TEE);  Surgeon: Sanda Klein, MD;  Location: [facility] ENDOSCOPY;  Service: Cardiovascular;  Laterality: N/A;  3D TEE/ patient will be admitted the day before test , therefo patient will be a inpatient the day of TEE   TOTAL KNEE ARTHROPLASTY Left 02/15/2012   Procedure: TOTAL KNEE ARTHROPLASTY;  Surgeon: Alta Corning, MD;  Location: [place];  Service: Orthopedics;  Laterality: Left;    Current Medications: Current Meds  Medication Sig   acetaminophen (TYLENOL) 325 MG tablet Take 650 mg by mouth every 6 (six) hours as needed for mild pain or headache.   allopurinol (ZYLOPRIM) 300 MG tablet Take 150 mg by mouth daily.    aspirin EC 81 MG tablet Take 81 mg by mouth 2 (two) times a week. Swallow whole.   atorvastatin (LIPITOR) 10 MG tablet TAKE 1 TABLET (10 MG TOTAL) BY MOUTH DAILY AT 6 PM.   cephALEXin (KEFLEX) 250 MG capsule Take 250 mg by mouth 2 (two) times daily.   ciprofloxacin (CIPRO) 500 MG tablet Take 500 mg by mouth 2 (two) times daily.   diltiazem (CARDIZEM CD) 120 MG 24 hr capsule Take 1 capsule (120 mg total) by mouth daily.   furosemide (LASIX) 20 MG tablet Take 20 mg by mouth as needed for fluid or edema (for worsening leg swelling). Take weight gain of 3 pounds in a day or 5 pounds in a week.   lisinopril (ZESTRIL) 20 MG tablet Take 20 mg by mouth daily.   omeprazole (PRILOSEC OTC) 20 MG tablet Take 20 mg by mouth every other day.   polyethylene glycol (MIRALAX / GLYCOLAX) packet Take 17 g by mouth daily as needed for moderate constipation.    predniSONE (DELTASONE) 10 MG tablet Take 10 mg by mouth daily with breakfast.   [DISCONTINUED] diltiazem (CARDIZEM CD) 300 MG 24 hr capsule Take 300 mg by  mouth daily.     Allergies:   Patient has no known allergies.   Social History   Socioeconomic History   Marital status: Married    Spouse name: Not on file   Number of children: Not on file   Years of education: Not on file   Highest education level: Not on file  Occupational History   Not on file  Tobacco Use   Smoking status: Former    Years: 20.00    Types: Cigarettes    Quit date: 02/08/1961    Years since quitting: 61.1   Smokeless tobacco: Former    Quit date: 02/08/1961  Vaping  Use   Vaping Use: Never used  Substance and Sexual Activity   Alcohol use: Yes    Alcohol/week: 7.0 standard drinks of alcohol    Types: 7 Standard drinks or equivalent per week    Comment: 2ozs Building services engineer daily   Drug use: No   Sexual activity: Not Currently  Other Topics Concern   Not on file  Social History Narrative   Not on file   Social Determinants of Health   Financial Resource Strain: Not on file  Food Insecurity: Not on file  Transportation Needs: Not on file  Physical Activity: Not on file  Stress: Not on file  Social Connections: Not on file     Family History: The patient's family history includes Cancer in his paternal grandfather; Heart attack in his maternal grandfather; Hypertension in his mother; Irregular heart beat in his brother; Lung cancer in his father; Osteoporosis in his mother.  ROS:   Please see the history of present illness.     EKGs/Labs/Other Studies Reviewed:    Recent Labs: 01/11/2022: Hemoglobin 9.5; Platelet Count 162   Recent Lipid Panel No results found for: "CHOL", "TRIG", "HDL", "LDLCALC", "LDLDIRECT"  Physical Exam:    VS:  BP (!) 138/40 (BP Location: Left Arm, Patient Position: Sitting, Cuff Size: Normal)   Pulse 60   Ht 6' (1.829 m)   Wt 179 lb 3.2 oz (81.3 kg)   SpO2 96%   BMI 24.30 kg/m    No data found.       Wt Readings from Last 3 Encounters:  03/07/22 179 lb 3.2 oz (81.3 kg)  01/11/22 171 lb 4.8 oz (77.7 kg)  07/13/21  174 lb 3.2 oz (79 kg)     GEN:  Well nourished, well developed in no acute distress; elderly HEENT: Normal NECK: No JVD; No carotid bruits CARDIAC: RRR, no murmurs, rubs, gallops RESPIRATORY:  Clear to auscultation without rales, wheezing or rhonchi  ABDOMEN: Soft, non-tender, non-distended MUSCULOSKELETAL: trace LE edema; varicosities, dry skin SKIN: Warm and dry NEUROLOGIC:  Alert and oriented PSYCHIATRIC:  Normal affect     ASSESSMENT AND PLAN   LE edema, mild -Labs Feb 16 2022: BUN 49/ Cr 1.7/K 5.2. -Leg swelling may be multifactorial including CKD, low mobility, anemia, venous insufficiency and prednisone use. -Think patient's weight and swelling is stable to transition to Lasix as needed.  Take Lasix 20 mg as needed for weight gain of 3 pounds a day (weight 174 pounds or higher) or 5 pounds in a week.  Would like to be cautious with Lasix use given his age and CKD.  Mitral regurgitation -2D echo 2018: EF 60 to 65%, moderately dilated LV, mild mitral prolapse involving posterior leaflet, moderate regurgitation directed anteriorly, mild to moderate TR, moderate PR -Patient has LE edema but no other symptoms of heart failure.  Do not think repeat 2D echo is necessary at this point.  Coronary artery disease, no angina -Left heart cath 2013 with 50% proximal ramus 70% mid dominant RCA -Patient is on aspirin 81 mg twice a week and Lipitor 10 mg daily.  Patient does ask if he can come off any medications --will defer to Dr. Suzanna Obey PCP regarding long term statin use at his age.  Patient brought up worsening fogginess/slurred speech and asked if this can be a side effect of a medications.  I mention there is no evidence of increased dementia with statin; though there is less long-term benefit with statins at his age.  Hypertension, controlled at  home -SBP 110s-140s at home; mentions white coat hypertension.  With HR 60s, reasonable to lower Cardizem dosing to '120mg'$   daily.  Hyperlipidemia -Continue Lipitor 10 mg daily for now -though can have future conversations of long-term use given age & patient's concern for polypharmacy.  Disposition - Follow-up in 3 months with Dr. Gwenlyn Found.   Medication Adjustments/Labs and Tests Ordered: Current medicines are reviewed at length with the patient today.  Concerns regarding medicines are outlined above.  No orders of the defined types were placed in this encounter.  Meds ordered this encounter  Medications   diltiazem (CARDIZEM CD) 120 MG 24 hr capsule    Sig: Take 1 capsule (120 mg total) by mouth daily.    Dispense:  90 capsule    Refill:  3    Patient Instructions  Medication Instructions:  Decrease Cardizem from 300 to 120 mg ( Take 1 Tablet Daily). Lasix 20 mg ( Take 1 Tablet Daily for 2 days). *If you need a refill on your cardiac medications before your next appointment, please call your pharmacy*   Lab Work: No labs If you have labs (blood work) drawn today and your tests are completely normal, you will receive your results only by: [place] (if you have MyChart) OR A paper copy in the mail If you have any lab test that is abnormal or we need to change your treatment, we will call you to review the results.   Testing/Procedures: No Testing   Follow-Up: At [facility], you and your health needs are our priority.  As part of our continuing mission to provide you with exceptional heart care, we have created designated Provider Care Teams.  These Care Teams include your primary Cardiologist (physician) and Advanced Practice Providers (APPs -  Physician Assistants and Nurse Practitioners) who all work together to provide you with the care you need, when you need it.  We recommend signing up for the patient portal called "MyChart".  Sign up information is provided on this After Visit Summary.  MyChart is used to connect with patients for Virtual Visits (Telemedicine).  Patients are  able to view lab/test results, encounter notes, upcoming appointments, etc.  Non-urgent messages can be sent to your provider as well.   To learn more about what you can do with MyChart, go to [email or web address].    Your next appointment:   3 month(s)  Provider:   Quay Burow, MD    Other Instructions Take Lasix for weight gain above 174 pounds.  I anticipate leg swelling will improve once finishing Prednisone and Anemia improves.   Signed, Warren Lacy, PA-C  03/07/2022 12:34 PM    [facility]

## 2022-03-06 NOTE — Telephone Encounter (Signed)
Unable to reach patient.  He has a visit scheduled tomorrow with Melvenia Needles PA

## 2022-03-06 NOTE — Telephone Encounter (Signed)
Pt c/o swelling: STAT is pt has developed SOB within 24 hours  How much weight have you gained and in what time span?  Patient states he has gained 8 lbs but he is unsure of the time span  If swelling, where is the swelling located?  Legs   Are you currently taking a fluid pill?  Yes   Are you currently SOB?  No   Do you have a log of your daily weights (if so, list)?  No, patient states that he just recently started keeping track of his weights when he started having issues  Have you gained 3 pounds in a day or 5 pounds in a week?   Have you traveled recently?  No

## 2022-03-07 ENCOUNTER — Encounter: Payer: Self-pay | Admitting: Physician Assistant

## 2022-03-07 ENCOUNTER — Ambulatory Visit: Payer: PPO | Attending: Physician Assistant | Admitting: Physician Assistant

## 2022-03-07 VITALS — BP 138/40 | HR 60 | Ht 72.0 in | Wt 179.2 lb

## 2022-03-07 DIAGNOSIS — I340 Nonrheumatic mitral (valve) insufficiency: Secondary | ICD-10-CM | POA: Diagnosis not present

## 2022-03-07 DIAGNOSIS — R600 Localized edema: Secondary | ICD-10-CM

## 2022-03-07 DIAGNOSIS — I2510 Atherosclerotic heart disease of native coronary artery without angina pectoris: Secondary | ICD-10-CM | POA: Diagnosis not present

## 2022-03-07 DIAGNOSIS — I10 Essential (primary) hypertension: Secondary | ICD-10-CM

## 2022-03-07 MED ORDER — DILTIAZEM HCL ER COATED BEADS 120 MG PO CP24: 120.0000 mg | ORAL_CAPSULE | Freq: Every day | ORAL | 3 refills | Status: DC

## 2022-03-07 NOTE — Patient Instructions (Addendum)
Medication Instructions:  Decrease Cardizem from 300 to 120 mg ( Take 1 Tablet Daily). Lasix 20 mg ( Take 1 Tablet Daily for 2 days). *If you need a refill on your cardiac medications before your next appointment, please call your pharmacy*   Lab Work: No labs If you have labs (blood work) drawn today and your tests are completely normal, you will receive your results only by: [place] (if you have MyChart) OR A paper copy in the mail If you have any lab test that is abnormal or we need to change your treatment, we will call you to review the results.   Testing/Procedures: No Testing   Follow-Up: At [facility], you and your health needs are our priority.  As part of our continuing mission to provide you with exceptional heart care, we have created designated Provider Care Teams.  These Care Teams include your primary Cardiologist (physician) and Advanced Practice Providers (APPs -  Physician Assistants and Nurse Practitioners) who all work together to provide you with the care you need, when you need it.  We recommend signing up for the patient portal called "MyChart".  Sign up information is provided on this After Visit Summary.  MyChart is used to connect with patients for Virtual Visits (Telemedicine).  Patients are able to view lab/test results, encounter notes, upcoming appointments, etc.  Non-urgent messages can be sent to your provider as well.   To learn more about what you can do with MyChart, go to [email or web address].    Your next appointment:   3 month(s)  Provider:   Quay Burow, MD    Other Instructions Take Lasix for weight gain above 174 pounds.  I anticipate leg swelling will improve once finishing Prednisone and Anemia improves.

## 2022-04-09 DIAGNOSIS — I129 Hypertensive chronic kidney disease with stage 1 through stage 4 chronic kidney disease, or unspecified chronic kidney disease: Secondary | ICD-10-CM | POA: Diagnosis not present

## 2022-04-09 DIAGNOSIS — N1832 Chronic kidney disease, stage 3b: Secondary | ICD-10-CM | POA: Diagnosis not present

## 2022-04-11 DIAGNOSIS — M150 Primary generalized (osteo)arthritis: Secondary | ICD-10-CM | POA: Diagnosis not present

## 2022-04-11 DIAGNOSIS — I700 Atherosclerosis of aorta: Secondary | ICD-10-CM | POA: Diagnosis not present

## 2022-04-11 DIAGNOSIS — N400 Enlarged prostate without lower urinary tract symptoms: Secondary | ICD-10-CM | POA: Diagnosis not present

## 2022-04-11 DIAGNOSIS — I5042 Chronic combined systolic (congestive) and diastolic (congestive) heart failure: Secondary | ICD-10-CM | POA: Diagnosis not present

## 2022-04-11 DIAGNOSIS — I2510 Atherosclerotic heart disease of native coronary artery without angina pectoris: Secondary | ICD-10-CM | POA: Diagnosis not present

## 2022-04-11 DIAGNOSIS — D500 Iron deficiency anemia secondary to blood loss (chronic): Secondary | ICD-10-CM | POA: Diagnosis not present

## 2022-04-11 DIAGNOSIS — R7303 Prediabetes: Secondary | ICD-10-CM | POA: Diagnosis not present

## 2022-04-11 DIAGNOSIS — I129 Hypertensive chronic kidney disease with stage 1 through stage 4 chronic kidney disease, or unspecified chronic kidney disease: Secondary | ICD-10-CM | POA: Diagnosis not present

## 2022-04-11 DIAGNOSIS — N1832 Chronic kidney disease, stage 3b: Secondary | ICD-10-CM | POA: Diagnosis not present

## 2022-04-11 DIAGNOSIS — I495 Sick sinus syndrome: Secondary | ICD-10-CM | POA: Diagnosis not present

## 2022-04-11 DIAGNOSIS — E782 Mixed hyperlipidemia: Secondary | ICD-10-CM | POA: Diagnosis not present

## 2022-04-11 DIAGNOSIS — I6521 Occlusion and stenosis of right carotid artery: Secondary | ICD-10-CM | POA: Diagnosis not present

## 2022-04-20 ENCOUNTER — Telehealth: Payer: Self-pay | Admitting: Internal Medicine

## 2022-04-20 NOTE — Telephone Encounter (Signed)
Rescheduled 05/09 appointment to 05/23 due to provider pal, patient has been called and notified.

## 2022-05-08 ENCOUNTER — Telehealth: Payer: Self-pay | Admitting: Internal Medicine

## 2022-05-08 NOTE — Telephone Encounter (Signed)
Rescheduled 05/23 appointment due to provider pal, patient has been called and notified.  

## 2022-05-10 ENCOUNTER — Ambulatory Visit: Payer: PPO | Admitting: Internal Medicine

## 2022-05-10 ENCOUNTER — Other Ambulatory Visit: Payer: PPO

## 2022-05-17 ENCOUNTER — Other Ambulatory Visit: Payer: Self-pay | Admitting: Cardiovascular Disease

## 2022-05-24 ENCOUNTER — Other Ambulatory Visit: Payer: PPO

## 2022-05-24 ENCOUNTER — Ambulatory Visit: Payer: PPO | Admitting: Internal Medicine

## 2022-05-29 ENCOUNTER — Other Ambulatory Visit: Payer: Self-pay

## 2022-05-29 ENCOUNTER — Inpatient Hospital Stay: Payer: PPO | Attending: Internal Medicine

## 2022-05-29 ENCOUNTER — Encounter: Payer: Self-pay | Admitting: Physician Assistant

## 2022-05-29 ENCOUNTER — Inpatient Hospital Stay (HOSPITAL_BASED_OUTPATIENT_CLINIC_OR_DEPARTMENT_OTHER): Payer: PPO | Admitting: Internal Medicine

## 2022-05-29 VITALS — BP 142/56 | HR 77 | Temp 97.4°F | Resp 19 | Ht 72.0 in | Wt 169.3 lb

## 2022-05-29 DIAGNOSIS — E611 Iron deficiency: Secondary | ICD-10-CM | POA: Insufficient documentation

## 2022-05-29 DIAGNOSIS — N189 Chronic kidney disease, unspecified: Secondary | ICD-10-CM | POA: Diagnosis not present

## 2022-05-29 DIAGNOSIS — D649 Anemia, unspecified: Secondary | ICD-10-CM | POA: Diagnosis not present

## 2022-05-29 DIAGNOSIS — K5520 Angiodysplasia of colon without hemorrhage: Secondary | ICD-10-CM | POA: Diagnosis not present

## 2022-05-29 DIAGNOSIS — D509 Iron deficiency anemia, unspecified: Secondary | ICD-10-CM

## 2022-05-29 LAB — CBC WITH DIFFERENTIAL (CANCER CENTER ONLY)
Abs Immature Granulocytes: 0.02 10*3/uL (ref 0.00–0.07)
Basophils Absolute: 0 10*3/uL (ref 0.0–0.1)
Basophils Relative: 0 %
Eosinophils Absolute: 0.3 10*3/uL (ref 0.0–0.5)
Eosinophils Relative: 4 %
HCT: 33 % — ABNORMAL LOW (ref 39.0–52.0)
Hemoglobin: 11.1 g/dL — ABNORMAL LOW (ref 13.0–17.0)
Immature Granulocytes: 0 %
Lymphocytes Relative: 21 %
Lymphs Abs: 1.4 10*3/uL (ref 0.7–4.0)
MCH: 32.7 pg (ref 26.0–34.0)
MCHC: 33.6 g/dL (ref 30.0–36.0)
MCV: 97.3 fL (ref 80.0–100.0)
Monocytes Absolute: 0.8 10*3/uL (ref 0.1–1.0)
Monocytes Relative: 11 %
Neutro Abs: 4.1 10*3/uL (ref 1.7–7.7)
Neutrophils Relative %: 64 %
Platelet Count: 141 10*3/uL — ABNORMAL LOW (ref 150–400)
RBC: 3.39 MIL/uL — ABNORMAL LOW (ref 4.22–5.81)
RDW: 15.2 % (ref 11.5–15.5)
WBC Count: 6.6 10*3/uL (ref 4.0–10.5)
nRBC: 0 % (ref 0.0–0.2)

## 2022-05-29 LAB — IRON AND IRON BINDING CAPACITY (CC-WL,HP ONLY)
Iron: 70 ug/dL (ref 45–182)
Saturation Ratios: 21 % (ref 17.9–39.5)
TIBC: 340 ug/dL (ref 250–450)
UIBC: 270 ug/dL (ref 117–376)

## 2022-05-29 LAB — FERRITIN: Ferritin: 60 ng/mL (ref 24–336)

## 2022-05-29 NOTE — Progress Notes (Signed)
[facility] [phone]   [phone]  OFFICE PROGRESS NOTE  Georgianne Fick, MD [address] [place] [address]  DIAGNOSIS: Persistent normocytic anemia likely secondary to chronic kidney disease in addition to iron deficiency secondary to colon AV malformation.  PRIOR THERAPY: Iron infusion with Venofer 300 mg IV weekly for 3 weeks.  Last infusion was given in January 2024.  CURRENT THERAPY: None  INTERVAL HISTORY: [name] 89 y.o. male returns to the clinic today for follow-up visit accompanied by his wife.  The patient is feeling much better today after the iron infusion in January 2024.  He had 1 episode of urinary tract infection and he was treated by his primary care physician.  He also has mild congestive heart failure and renal insufficiency and he is followed by nephrology and cardiology.  He is not on any over-the-counter supplements.  He denied having any current chest pain, shortness of breath, cough or hemoptysis.  He has no nausea, vomiting, diarrhea or constipation.  He has no headache or visual changes.  He denied having any recent weight loss or night sweats.  He is here today for evaluation and repeat blood work.    MEDICAL HISTORY: Past Medical History:  Diagnosis Date   Anemia    low hemoglobin   Arthritis    AV (angiodysplasia malformation of colon)    Benign prostatic hypertrophy    Cancer (HCC)    basal cell cancer   Chronic kidney disease    elevated Cr, BUN, Dr. Caryn Section manages   COPD (chronic obstructive pulmonary disease) (HCC)    Coronary artery disease    GERD (gastroesophageal reflux disease)    Gout    Heart murmur    History of kidney stones    Hyperlipidemia    Hypertension    Influenza    February 2019   Mitral regurgitation    Mitral valve prolapse    Pneumonia 1970's   PONV (postoperative nausea and vomiting)    1st knee replacement   Shortness of breath      ALLERGIES:  has No Known Allergies.  MEDICATIONS:  Current Outpatient Medications  Medication Sig Dispense Refill   acetaminophen (TYLENOL) 325 MG tablet Take 650 mg by mouth every 6 (six) hours as needed for mild pain or headache.     allopurinol (ZYLOPRIM) 300 MG tablet Take 150 mg by mouth daily.      aspirin EC 81 MG tablet Take 81 mg by mouth 2 (two) times a week. Swallow whole.     atorvastatin (LIPITOR) 10 MG tablet TAKE 1 TABLET (10 MG TOTAL) BY MOUTH DAILY AT 6 PM. 90 tablet 3   cephALEXin (KEFLEX) 250 MG capsule Take 250 mg by mouth 2 (two) times daily.     ciprofloxacin (CIPRO) 500 MG tablet Take 500 mg by mouth 2 (two) times daily.     diltiazem (CARDIZEM CD) 120 MG 24 hr capsule Take 1 capsule (120 mg total) by mouth daily. 90 capsule 3   furosemide (LASIX) 20 MG tablet Take 20 mg by mouth as needed for fluid or edema (for worsening leg swelling). Take weight gain of 3 pounds in a day or 5 pounds in a week.     lisinopril (ZESTRIL) 20 MG tablet Take 20 mg by mouth daily.     omeprazole (PRILOSEC OTC) 20 MG tablet Take 20 mg by mouth every other day.     polyethylene glycol (  MIRALAX / GLYCOLAX) packet Take 17 g by mouth daily as needed for moderate constipation.      predniSONE (DELTASONE) 10 MG tablet Take 10 mg by mouth daily with breakfast.     No current facility-administered medications for this visit.    SURGICAL HISTORY:  Past Surgical History:  Procedure Laterality Date   ABDOMINAL ANGIOGRAM  04/30/2011   Procedure: ABDOMINAL ANGIOGRAM;  Surgeon: Runell Gess, MD;  Location: [facility] CATH LAB;  Service: Cardiovascular;;   CARDIAC CATHETERIZATION  04/2011   x2   CARDIAC CATHETERIZATION  08/2009   CATARACT EXTRACTION  2011   bilateral   COLONOSCOPY  05/04/2011   Procedure: COLONOSCOPY;  Surgeon: Theda Belfast, MD;  [facility] ENDOSCOPY;  Service: Endoscopy;  Laterality: N/A;   COLONOSCOPY N/A 07/16/2013   Procedure: COLONOSCOPY;  Surgeon: Theda Belfast, MD;   Location: [facility] ENDOSCOPY;  Service: Endoscopy;  Laterality: N/A;   COLONOSCOPY WITH PROPOFOL N/A 06/20/2017   Procedure: COLONOSCOPY WITH PROPOFOL;  Surgeon: Jeani Hawking, MD;  Location[facility] ENDOSCOPY;  Service: Endoscopy;  Laterality: N/A;   CORONARY ANGIOGRAM  04/30/2011   Procedure: CORONARY ANGIOGRAM;  Surgeon: Runell Gess, MD;  Location: [facility] CATH LAB;  Service: Cardiovascular;;   ESOPHAGOGASTRODUODENOSCOPY  05/04/2011   Procedure: ESOPHAGOGASTRODUODENOSCOPY (EGD);  Surgeon: Theda Belfast, MD;  Location: [facility] ENDOSCOPY;  Service: Endoscopy;  Laterality: N/A;   EYE SURGERY     bilateral cataracts removed   HERNIA REPAIR  1999&2000   bilateral   LOBECTOMY  1980   left lower lobectomy for benign tumor   OPEN REDUCTION INTERNAL FIXATION (ORIF) DISTAL RADIAL FRACTURE Left 03/19/2017   Procedure: OPEN REDUCTION INTERNAL FIXATION (ORIF) DISTAL RADIAL FRACTURE;  Surgeon: Jodi Geralds, MD;  Location: [facility] OR;  Service: Orthopedics;  Laterality: Left;  AXILLARY BLOCK   POLYPECTOMY  06/20/2017   Procedure: POLYPECTOMY;  Surgeon: Jeani Hawking, MD;  Location: [facility] ENDOSCOPY;  Service: Endoscopy;;   REPLACEMENT TOTAL KNEE  2005   right   RIGHT HEART CATHETERIZATION  04/30/2011   Procedure: RIGHT HEART CATH;  Surgeon: Runell Gess, MD;  Location: [facility] CATH LAB;  Service: Cardiovascular;;   TEE WITHOUT CARDIOVERSION  04/30/2011   Procedure: TRANSESOPHAGEAL ECHOCARDIOGRAM (TEE);  Surgeon: Thurmon Fair, MD;  Location: [facility] ENDOSCOPY;  Service: Cardiovascular;  Laterality: N/A;  3D TEE/ patient will be admitted the day before test , therefo patient will be a inpatient the day of TEE   TOTAL KNEE ARTHROPLASTY Left 02/15/2012   Procedure: TOTAL KNEE ARTHROPLASTY;  Surgeon: Harvie Junior, MD;  Location: [facility] OR;  Service: Orthopedics;  Laterality: Left;    REVIEW OF SYSTEMS:  A comprehensive review of systems was negative except for: Constitutional: positive for fatigue Musculoskeletal: positive for arthralgias    PHYSICAL EXAMINATION: General appearance: alert, cooperative, fatigued, and no distress Head: Normocephalic, without obvious abnormality, atraumatic Neck: no adenopathy, no JVD, supple, symmetrical, trachea midline, and thyroid not enlarged, symmetric, no tenderness/mass/nodules Lymph nodes: Cervical, supraclavicular, and axillary nodes normal. Resp: clear to auscultation bilaterally Back: symmetric, no curvature. ROM normal. No CVA tenderness. Cardio: regular rate and rhythm, S1, S2 normal, no murmur, click, rub or gallop GI: soft, non-tender; bowel sounds normal; no masses,  no organomegaly Extremities: extremities normal, atraumatic, no cyanosis or edema  ECOG PERFORMANCE STATUS: 1 - Symptomatic but completely ambulatory  Blood pressure (!) 142/56, pulse 77, temperature (!) 97.4 F (36.3 C), temperature source Oral, resp. rate 19, height 6' (1.829 m), weight 169 lb 4.8 oz (76.8  kg), SpO2 96 %.  LABORATORY DATA: Lab Results  Component Value Date   WBC 6.6 05/29/2022   HGB 11.1 (L) 05/29/2022   HCT 33.0 (L) 05/29/2022   MCV 97.3 05/29/2022   PLT 141 (L) 05/29/2022      Chemistry      Component Value Date/Time   NA 141 08/17/2020 1119   K 4.9 08/17/2020 1119   CL 110 08/17/2020 1119   CO2 20 (L) 08/17/2020 1119   BUN 45 (H) 08/17/2020 1119   CREATININE 1.86 (H) 08/17/2020 1119      Component Value Date/Time   CALCIUM 9.6 08/17/2020 1119   ALKPHOS 116 08/17/2020 1119   AST 16 08/17/2020 1119   ALT 8 08/17/2020 1119   BILITOT 0.4 08/17/2020 1119       RADIOGRAPHIC STUDIES: No results found.  ASSESSMENT AND PLAN: This is a very pleasant 89 years old white male with history of persistent normocytic anemia likely secondary to chronic kidney disease in addition to iron deficiency secondary to colon AV malformation. The patient was treated with iron infusion with Venofer 300 mg IV weekly for 3 weeks and he tolerated his infusion well.  His last infusion was in January  2024. He tolerated the last iron infusion fairly well. He is here today for evaluation with repeat blood work. His CBC showed hemoglobin of 11.1 and hematocrit of 33.0.  His platelets count are slightly low at 141,000. Iron study and ferritin are still pending. I recommended for the patient to continue on observation with close monitoring. I will see him back for follow-up visit in 6 months for evaluation with repeat blood work. He was advised to call immediately if he has any other concerning symptoms in the interval. The patient voices understanding of current disease status and treatment options and is in agreement with the current care plan.  All questions were answered. The patient knows to call the clinic with any problems, questions or concerns. We can certainly see the patient much sooner if necessary.  The total time spent in the appointment was 20 minutes.  Disclaimer: This note was dictated with voice recognition software. Similar sounding words can inadvertently be transcribed and may not be corrected upon review.

## 2022-07-06 ENCOUNTER — Encounter: Payer: Self-pay | Admitting: Cardiovascular Disease

## 2022-07-06 ENCOUNTER — Ambulatory Visit: Payer: PPO | Attending: Cardiovascular Disease | Admitting: Cardiovascular Disease

## 2022-07-06 VITALS — BP 164/72 | HR 62 | Ht 72.0 in | Wt 170.6 lb

## 2022-07-06 DIAGNOSIS — I10 Essential (primary) hypertension: Secondary | ICD-10-CM | POA: Diagnosis not present

## 2022-07-06 DIAGNOSIS — E785 Hyperlipidemia, unspecified: Secondary | ICD-10-CM

## 2022-07-06 DIAGNOSIS — I2510 Atherosclerotic heart disease of native coronary artery without angina pectoris: Secondary | ICD-10-CM

## 2022-07-06 DIAGNOSIS — I340 Nonrheumatic mitral (valve) insufficiency: Secondary | ICD-10-CM

## 2022-07-06 NOTE — Assessment & Plan Note (Signed)
History of CAD status post cardiac catheterization performed by myself 08/15/2009 revealing moderate RCA LAD and ramus branch disease with normal LV function.  I recatheterized him 04/30/2011 revealing essentially unchanged anatomy.  He denies chest pain.

## 2022-07-06 NOTE — Progress Notes (Signed)
07/06/2022 [name]   Dec 03, 1925  [id number]  Primary Physician Georgianne Fick, MD Primary Cardiologist: Runell Gess MD FACP, [place], [place], [place]  HPI:  [name] is a 89 y.o.    thin-appearing married Caucasian male, father of 2, grandfather to 2 grandchildren, who I last saw office 03/14/2021. 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  He has a history of noncritical CAD by catheterization performed by myself, August 15, 2009, with moderate disease in his mid RCA, LAD, and ramus branch, and normal LV function. He did have severe MR secondary to mitral valve prolapse with severe left atrial enlargement. His other problems include hypertension and hyperlipidemia. He was complaining of increasing dyspnea. I brought him in, performing right and left heart catheterization, April 30, 2011, revealing a 50% proximal ramus branch stenosis, 70% mid dominant RCA stenosis just after the takeoff of an acute marginal branch. A TEE performed by Dr. Royann Shivers on April 30, 2011, revealed severe flail mid scallop of the posterior leaflet secondary to ruptured chordae. He was fairly anemic at that time and was seen by Dr. Jeani Hawking. He underwent upper and lower endoscopy, revealing AVMs. His hemoglobin improved with iron repletion and his symptoms resolved.  He gets his hemoglobin checked frequently and is in the 10 range currently after iron transfusion.   Since I saw him a year ago he is remained stable.    He is not working as much as he did pre-COVID.  He is walking with a walker because of instability.  He has gotten iron infusions in the past because of anemia followed by Dr. Shirline Frees.[name]  He denies chest pain or shortness of breath.   Current Meds  Medication Sig   acetaminophen (TYLENOL) 325 MG tablet Take 650 mg by mouth every 6 (six) hours as needed for mild pain or headache.   allopurinol (ZYLOPRIM) 300 MG tablet Take 150 mg by  mouth daily.    aspirin EC 81 MG tablet Take 81 mg by mouth 2 (two) times a week. Swallow whole.   atorvastatin (LIPITOR) 10 MG tablet TAKE 1 TABLET (10 MG TOTAL) BY MOUTH DAILY AT 6 PM.   lisinopril (ZESTRIL) 20 MG tablet Take 20 mg by mouth daily.   omeprazole (PRILOSEC OTC) 20 MG tablet Take 20 mg by mouth every other day.   polyethylene glycol (MIRALAX / GLYCOLAX) packet Take 17 g by mouth daily as needed for moderate constipation.      No Known Allergies  Social History   Socioeconomic History   Marital status: Married    Spouse name: Not on file   Number of children: Not on file   Years of education: Not on file   Highest education level: Not on file  Occupational History   Not on file  Tobacco Use   Smoking status: Former    Years: 20    Types: Cigarettes    Quit date: 02/08/1961    Years since quitting: 61.4   Smokeless tobacco: Former    Quit date: 02/08/1961  Vaping Use   Vaping Use: Never used  Substance and Sexual Activity   Alcohol use: Yes    Alcohol/week: 7.0 standard drinks of alcohol    Types: 7 Standard drinks or equivalent per week    Comment: 2ozs Financial controller daily   Drug use: No   Sexual activity: Not Currently  Other Topics Concern   Not on file  Social  History Narrative   Not on file   Social Determinants of Health   Financial Resource Strain: Not on file  Food Insecurity: Not on file  Transportation Needs: Not on file  Physical Activity: Not on file  Stress: Not on file  Social Connections: Not on file  Intimate Partner Violence: Not on file     Review of Systems: General: negative for chills, fever, night sweats or weight changes.  Cardiovascular: negative for chest pain, dyspnea on exertion, edema, orthopnea, palpitations, paroxysmal nocturnal dyspnea or shortness of breath Dermatological: negative for rash Respiratory: negative for cough or wheezing Urologic: negative for hematuria Abdominal: negative for nausea, vomiting, diarrhea, bright  red blood per rectum, melena, or hematemesis Neurologic: negative for visual changes, syncope, or dizziness All other systems reviewed and are otherwise negative except as noted above.    Blood pressure (!) 164/72, pulse 62, height 6' (1.829 m), weight 170 lb 9.6 oz (77.4 kg), SpO2 97 %.  General appearance: alert and no distress Neck: no adenopathy, no carotid bruit, no JVD, supple, symmetrical, trachea midline, and thyroid not enlarged, symmetric, no tenderness/mass/nodules Lungs: clear to auscultation bilaterally Heart: regular rate and rhythm, S1, S2 normal, no murmur, click, rub or gallop Extremities: extremities normal, atraumatic, no cyanosis or edema Pulses: 2+ and symmetric Skin: Skin color, texture, turgor normal. No rashes or lesions Neurologic: Grossly normal  EKG EKG Interpretation Date/Time:  Friday July 06 2022 09:51:32 EDT Ventricular Rate:  62 PR Interval:  278 QRS Duration:  106 QT Interval:  402 QTC Calculation: 408 R Axis:   -34  Text Interpretation: Sinus rhythm with marked sinus arrhythmia with 1st degree A-V block Left axis deviation Possible Anterior infarct , age undetermined When compared with ECG of 14-Jun-2017 12:25, PREVIOUS ECG IS PRESENT Confirmed by Nanetta Batty [id number]) on 07/06/2022 10:10:47 AM    ASSESSMENT AND PLAN:   Mitral regurgitation, severe History of mitral regurgitation 2D echo left performed 02/22/2016 revealing normal LV systolic function with moderate MR directed anteriorly and mild mitral valve prolapse.  He is completely asymptomatic.  CAD, single vessel RCA History of CAD status post cardiac catheterization performed by myself 08/15/2009 revealing moderate RCA LAD and ramus branch disease with normal LV function.  I recatheterized him 04/30/2011 revealing essentially unchanged anatomy.  He denies chest pain.  HTN (hypertension) History of essential hypertension blood pressure measured today at 164/72.  He is on lisinopril and  diltiazem.  Dyslipidemia History of dyslipidemia on statin therapy with lipid profile performed 11/09/2020 revealing total cholesterol 143, LDL 56 and HDL 56.     Runell Gess MD [facility] 07/06/2022 10:20 AM

## 2022-07-06 NOTE — Assessment & Plan Note (Signed)
History of dyslipidemia on statin therapy with lipid profile performed 11/09/2020 revealing total cholesterol 143, LDL 56 and HDL 56.

## 2022-07-06 NOTE — Patient Instructions (Signed)
Medication Instructions:  Your physician recommends that you continue on your current medications as directed. Please refer to the Current Medication list given to you today.  *If you need a refill on your cardiac medications before your next appointment, please call your pharmacy*   Follow-Up: At [facility], you and your health needs are our priority.  As part of our continuing mission to provide you with exceptional heart care, we have created designated Provider Care Teams.  These Care Teams include your primary Cardiologist (physician) and Advanced Practice Providers (APPs -  Physician Assistants and Nurse Practitioners) who all work together to provide you with the care you need, when you need it.  We recommend signing up for the patient portal called "MyChart".  Sign up information is provided on this After Visit Summary.  MyChart is used to connect with patients for Virtual Visits (Telemedicine).  Patients are able to view lab/test results, encounter notes, upcoming appointments, etc.  Non-urgent messages can be sent to your provider as well.   To learn more about what you can do with MyChart, go to [email or web address].    Your next appointment:   12 month(s)  Provider:   Jonathan Berry, MD    

## 2022-07-06 NOTE — Assessment & Plan Note (Signed)
History of mitral regurgitation 2D echo left performed 02/22/2016 revealing normal LV systolic function with moderate MR directed anteriorly and mild mitral valve prolapse.  He is completely asymptomatic.

## 2022-07-06 NOTE — Assessment & Plan Note (Signed)
History of essential hypertension blood pressure measured today at 164/72.  He is on lisinopril and diltiazem.

## 2022-07-11 DIAGNOSIS — I700 Atherosclerosis of aorta: Secondary | ICD-10-CM | POA: Diagnosis not present

## 2022-07-11 DIAGNOSIS — D500 Iron deficiency anemia secondary to blood loss (chronic): Secondary | ICD-10-CM | POA: Diagnosis not present

## 2022-07-11 DIAGNOSIS — I129 Hypertensive chronic kidney disease with stage 1 through stage 4 chronic kidney disease, or unspecified chronic kidney disease: Secondary | ICD-10-CM | POA: Diagnosis not present

## 2022-07-11 DIAGNOSIS — E782 Mixed hyperlipidemia: Secondary | ICD-10-CM | POA: Diagnosis not present

## 2022-07-11 DIAGNOSIS — R7303 Prediabetes: Secondary | ICD-10-CM | POA: Diagnosis not present

## 2022-07-11 DIAGNOSIS — N1832 Chronic kidney disease, stage 3b: Secondary | ICD-10-CM | POA: Diagnosis not present

## 2022-07-18 ENCOUNTER — Encounter: Payer: Self-pay | Admitting: Internal Medicine

## 2022-07-18 DIAGNOSIS — I5042 Chronic combined systolic (congestive) and diastolic (congestive) heart failure: Secondary | ICD-10-CM | POA: Diagnosis not present

## 2022-07-18 DIAGNOSIS — D500 Iron deficiency anemia secondary to blood loss (chronic): Secondary | ICD-10-CM | POA: Diagnosis not present

## 2022-07-18 DIAGNOSIS — M150 Primary generalized (osteo)arthritis: Secondary | ICD-10-CM | POA: Diagnosis not present

## 2022-07-18 DIAGNOSIS — N1832 Chronic kidney disease, stage 3b: Secondary | ICD-10-CM | POA: Diagnosis not present

## 2022-07-18 DIAGNOSIS — I6521 Occlusion and stenosis of right carotid artery: Secondary | ICD-10-CM | POA: Diagnosis not present

## 2022-07-18 DIAGNOSIS — I2510 Atherosclerotic heart disease of native coronary artery without angina pectoris: Secondary | ICD-10-CM | POA: Diagnosis not present

## 2022-07-18 DIAGNOSIS — I130 Hypertensive heart and chronic kidney disease with heart failure and stage 1 through stage 4 chronic kidney disease, or unspecified chronic kidney disease: Secondary | ICD-10-CM | POA: Diagnosis not present

## 2022-07-18 DIAGNOSIS — Z23 Encounter for immunization: Secondary | ICD-10-CM | POA: Diagnosis not present

## 2022-07-18 DIAGNOSIS — E782 Mixed hyperlipidemia: Secondary | ICD-10-CM | POA: Diagnosis not present

## 2022-07-18 DIAGNOSIS — I700 Atherosclerosis of aorta: Secondary | ICD-10-CM | POA: Diagnosis not present

## 2022-07-18 DIAGNOSIS — R7303 Prediabetes: Secondary | ICD-10-CM | POA: Diagnosis not present

## 2022-07-18 DIAGNOSIS — I495 Sick sinus syndrome: Secondary | ICD-10-CM | POA: Diagnosis not present

## 2022-09-04 DIAGNOSIS — N183 Chronic kidney disease, stage 3 unspecified: Secondary | ICD-10-CM | POA: Diagnosis not present

## 2022-09-04 DIAGNOSIS — E872 Acidosis, unspecified: Secondary | ICD-10-CM | POA: Diagnosis not present

## 2022-09-04 DIAGNOSIS — R809 Proteinuria, unspecified: Secondary | ICD-10-CM | POA: Diagnosis not present

## 2022-09-04 DIAGNOSIS — I129 Hypertensive chronic kidney disease with stage 1 through stage 4 chronic kidney disease, or unspecified chronic kidney disease: Secondary | ICD-10-CM | POA: Diagnosis not present

## 2022-09-04 DIAGNOSIS — N400 Enlarged prostate without lower urinary tract symptoms: Secondary | ICD-10-CM | POA: Diagnosis not present

## 2022-09-19 DIAGNOSIS — I130 Hypertensive heart and chronic kidney disease with heart failure and stage 1 through stage 4 chronic kidney disease, or unspecified chronic kidney disease: Secondary | ICD-10-CM | POA: Diagnosis not present

## 2022-09-19 DIAGNOSIS — D500 Iron deficiency anemia secondary to blood loss (chronic): Secondary | ICD-10-CM | POA: Diagnosis not present

## 2022-09-19 DIAGNOSIS — R7303 Prediabetes: Secondary | ICD-10-CM | POA: Diagnosis not present

## 2022-09-21 ENCOUNTER — Ambulatory Visit: Payer: Self-pay | Admitting: Podiatry

## 2022-09-24 ENCOUNTER — Ambulatory Visit (INDEPENDENT_AMBULATORY_CARE_PROVIDER_SITE_OTHER): Payer: PPO | Admitting: Podiatry

## 2022-09-24 ENCOUNTER — Ambulatory Visit: Payer: PPO | Admitting: Podiatry

## 2022-09-24 DIAGNOSIS — S91205A Unspecified open wound of left lesser toe(s) with damage to nail, initial encounter: Secondary | ICD-10-CM | POA: Diagnosis not present

## 2022-09-30 NOTE — Progress Notes (Signed)
Chief Complaint  Patient presents with   Nail Problem    Left 5th toe nail was caught on pants and nail is coming off. Not diabetic.    Rash    Patient skin in between right hallux and 2nd toe. He has been applying Vaseline to the area. He had an open area years ago that was cancerous and he had skin surgery to the top of right foot.     HPI: 89 y.o. male presenting today for evaluation of an injury to the left fifth toenail plate.  Patient states that he caught his left fifth toenail on a pair of pants.  He noticed bleeding to the area.  Presenting for further evaluation.  Past Medical History:  Diagnosis Date   Anemia    low hemoglobin   Arthritis    AV (angiodysplasia malformation of colon)    Benign prostatic hypertrophy    Cancer (HCC)    basal cell cancer   Chronic kidney disease    elevated Cr, BUN, Dr. Caryn Section manages   COPD (chronic obstructive pulmonary disease) (HCC)    Coronary artery disease    GERD (gastroesophageal reflux disease)    Gout    Heart murmur    History of kidney stones    Hyperlipidemia    Hypertension    Influenza    February 2019   Mitral regurgitation    Mitral valve prolapse    Pneumonia 1970's   PONV (postoperative nausea and vomiting)    1st knee replacement   Shortness of breath     Past Surgical History:  Procedure Laterality Date   ABDOMINAL ANGIOGRAM  04/30/2011   Procedure: ABDOMINAL ANGIOGRAM;  Surgeon: Runell Gess, MD;  Location: [facility] CATH LAB;  Service: Cardiovascular;;   CARDIAC CATHETERIZATION  04/2011   x2   CARDIAC CATHETERIZATION  08/2009   CATARACT EXTRACTION  2011   bilateral   COLONOSCOPY  05/04/2011   Procedure: COLONOSCOPY;  Surgeon: Theda Belfast, MD;  Location: [facility] ENDOSCOPY;  Service: Endoscopy;  Laterality: N/A;   COLONOSCOPY N/A 07/16/2013   Procedure: COLONOSCOPY;  Surgeon: Theda Belfast, MD;  Location: [facility] ENDOSCOPY;  Service: Endoscopy;  Laterality: N/A;   COLONOSCOPY WITH PROPOFOL N/A 06/20/2017    Procedure: COLONOSCOPY WITH PROPOFOL;  Surgeon: Jeani Hawking, MD;  Location: [facility] ENDOSCOPY;  Service: Endoscopy;  Laterality: N/A;   CORONARY ANGIOGRAM  04/30/2011   Procedure: CORONARY ANGIOGRAM;  Surgeon: Runell Gess, MD;  [facility] CATH LAB;  Service: Cardiovascular;;   ESOPHAGOGASTRODUODENOSCOPY  05/04/2011   Procedure: ESOPHAGOGASTRODUODENOSCOPY (EGD);  Surgeon: Theda Belfast, MD;  Location: [facility] ENDOSCOPY;  Service: Endoscopy;  Laterality: N/A;   EYE SURGERY     bilateral cataracts removed   HERNIA REPAIR  1999&2000   bilateral   LOBECTOMY  1980   left lower lobectomy for benign tumor   OPEN REDUCTION INTERNAL FIXATION (ORIF) DISTAL RADIAL FRACTURE Left 03/19/2017   Procedure: OPEN REDUCTION INTERNAL FIXATION (ORIF) DISTAL RADIAL FRACTURE;  Surgeon: Jodi Geralds, MD;  Location: [facility] OR;  Service: Orthopedics;  Laterality: Left;  AXILLARY BLOCK   POLYPECTOMY  06/20/2017   Procedure: POLYPECTOMY;  Surgeon: Jeani Hawking, MD;  Location: [facility] ENDOSCOPY;  Service: Endoscopy;;   REPLACEMENT TOTAL KNEE  2005   right   RIGHT HEART CATHETERIZATION  04/30/2011   Procedure: RIGHT HEART CATH;  Surgeon: Runell Gess, MD;  Location: [facility] CATH LAB;  Service: Cardiovascular;;   TEE WITHOUT CARDIOVERSION  04/30/2011   Procedure: TRANSESOPHAGEAL ECHOCARDIOGRAM (  TEE);  Surgeon: Thurmon Fair, MD;  Location: [facility] ENDOSCOPY;  Service: Cardiovascular;  Laterality: N/A;  3D TEE/ patient will be admitted the day before test , therefo patient will be a inpatient the day of TEE   TOTAL KNEE ARTHROPLASTY Left 02/15/2012   Procedure: TOTAL KNEE ARTHROPLASTY;  Surgeon: Harvie Junior, MD;  Location: [facility] OR;  Service: Orthopedics;  Laterality: Left;    No Known Allergies   Physical Exam: General: The patient is alert and oriented x3 in no acute distress.  Dermatology: Loosely adhered nail plate noted to the left fifth digit.  No purulence.  Clinically no concern for infection  Vascular: Palpable pedal pulses  bilaterally. Capillary refill within normal limits.  No appreciable edema.  No erythema.  Neurological: Grossly intact via light touch  Musculoskeletal Exam: No pedal deformities noted   Assessment/Plan of Care: 1.  Loosely adhered partial detachment of the left hallux nail plate  -Patient evaluated -The nail plate was very loosely adhered.  The nail plate was avulsed in its entirety without anesthesia.  Patient tolerated this well. -Dressings applied.  Post care instructions provided.  Recommend triple antibiotic and a Band-Aid -Return to clinic as needed       Felecia Shelling, DPM [facility]  Dr. Felecia Shelling, DPM    [address] [place], [place] [address]                Office [phone]  Fax [phone]

## 2022-10-01 ENCOUNTER — Ambulatory Visit: Payer: Self-pay | Admitting: Podiatry

## 2022-10-11 ENCOUNTER — Encounter: Payer: Self-pay | Admitting: Physician Assistant

## 2022-11-10 ENCOUNTER — Telehealth: Payer: Self-pay | Admitting: Internal Medicine

## 2022-11-10 NOTE — Telephone Encounter (Signed)
Called patient regarding rescheduled appointment due to provider pal, informed patient about 11/25 appointment. Patient is notified.

## 2022-11-26 ENCOUNTER — Inpatient Hospital Stay: Payer: PPO | Attending: Internal Medicine

## 2022-11-26 ENCOUNTER — Inpatient Hospital Stay: Payer: PPO | Admitting: Internal Medicine

## 2022-11-26 VITALS — BP 158/69 | HR 77 | Temp 97.6°F | Resp 16 | Ht 72.0 in | Wt 169.9 lb

## 2022-11-26 DIAGNOSIS — D631 Anemia in chronic kidney disease: Secondary | ICD-10-CM | POA: Insufficient documentation

## 2022-11-26 DIAGNOSIS — N189 Chronic kidney disease, unspecified: Secondary | ICD-10-CM | POA: Diagnosis not present

## 2022-11-26 DIAGNOSIS — D649 Anemia, unspecified: Secondary | ICD-10-CM | POA: Diagnosis not present

## 2022-11-26 DIAGNOSIS — R040 Epistaxis: Secondary | ICD-10-CM | POA: Insufficient documentation

## 2022-11-26 DIAGNOSIS — K5520 Angiodysplasia of colon without hemorrhage: Secondary | ICD-10-CM | POA: Diagnosis not present

## 2022-11-26 DIAGNOSIS — M170 Bilateral primary osteoarthritis of knee: Secondary | ICD-10-CM | POA: Diagnosis not present

## 2022-11-26 DIAGNOSIS — D508 Other iron deficiency anemias: Secondary | ICD-10-CM | POA: Insufficient documentation

## 2022-11-26 LAB — IRON AND IRON BINDING CAPACITY (CC-WL,HP ONLY)
Iron: 58 ug/dL (ref 45–182)
Saturation Ratios: 15 % — ABNORMAL LOW (ref 17.9–39.5)
TIBC: 377 ug/dL (ref 250–450)
UIBC: 319 ug/dL (ref 117–376)

## 2022-11-26 LAB — CBC WITH DIFFERENTIAL (CANCER CENTER ONLY)
Abs Immature Granulocytes: 0.02 10*3/uL (ref 0.00–0.07)
Basophils Absolute: 0 10*3/uL (ref 0.0–0.1)
Basophils Relative: 1 %
Eosinophils Absolute: 0.4 10*3/uL (ref 0.0–0.5)
Eosinophils Relative: 6 %
HCT: 31.9 % — ABNORMAL LOW (ref 39.0–52.0)
Hemoglobin: 10.6 g/dL — ABNORMAL LOW (ref 13.0–17.0)
Immature Granulocytes: 0 %
Lymphocytes Relative: 21 %
Lymphs Abs: 1.4 10*3/uL (ref 0.7–4.0)
MCH: 33.1 pg (ref 26.0–34.0)
MCHC: 33.2 g/dL (ref 30.0–36.0)
MCV: 99.7 fL (ref 80.0–100.0)
Monocytes Absolute: 0.7 10*3/uL (ref 0.1–1.0)
Monocytes Relative: 11 %
Neutro Abs: 4.1 10*3/uL (ref 1.7–7.7)
Neutrophils Relative %: 61 %
Platelet Count: 152 10*3/uL (ref 150–400)
RBC: 3.2 MIL/uL — ABNORMAL LOW (ref 4.22–5.81)
RDW: 14.1 % (ref 11.5–15.5)
WBC Count: 6.7 10*3/uL (ref 4.0–10.5)
nRBC: 0 % (ref 0.0–0.2)

## 2022-11-26 LAB — FERRITIN: Ferritin: 27 ng/mL (ref 24–336)

## 2022-11-26 NOTE — Progress Notes (Signed)
[facility] Telephone:([phone]) [phone]   Fax:([phone]) [phone]  OFFICE PROGRESS NOTE  Georgianne Fick, MD [address] [place] [address]  DIAGNOSIS: Persistent normocytic anemia likely secondary to chronic kidney disease in addition to iron deficiency secondary to colon AV malformation.  PRIOR THERAPY: Iron infusion with Venofer 300 mg IV weekly for 3 weeks.  Last infusion was given in January 2024.  CURRENT THERAPY: None  INTERVAL HISTORY: MARVIN CUCINELLA III 89 y.o. male returns to the clinic today for 29-month follow-up visit accompanied by his wife.Discussed the use of AI scribe software for clinical note transcription with the patient, who gave verbal consent to proceed.  History of Present Illness   The patient, a 89 year old with a history of normocytic anemia, chronic kidney disease, and iron deficiency, presents for a follow-up visit. The anemia is thought to be secondary to his chronic kidney disease and iron deficiency, possibly exacerbated by blood loss from AV malformations in the colon. The patient received Venofer, an iron infusion treatment, last in January of the current year.  Over the past six months, the patient reports feeling well overall, with a stable energy level. However, he notes a lack of strength improvement and an increase in sedentary behavior, primarily due to worsening knee arthritis. The patient uses a walker for mobility.  The patient denies taking any iron supplements due to gastrointestinal upset. He reports no significant episodes of dizziness, shortness of breath, or noticeable bleeding. However, he has experienced occasional nosebleeds, typically after nasal cleaning.  Lab work done in July showed a hemoglobin level of 11.3. Today's lab results show a hemoglobin of 10.6 and a hematocrit of 31.9, indicating a slight improvement from January's levels of 9.5 and 28.7, respectively. Iron saturation is slightly lower  than normal, but overall iron levels are within normal limits.          MEDICAL HISTORY: Past Medical History:  Diagnosis Date   Anemia    low hemoglobin   Arthritis    AV (angiodysplasia malformation of colon)    Benign prostatic hypertrophy    Cancer (HCC)    basal cell cancer   Chronic kidney disease    elevated Cr, BUN, Dr. Caryn Section manages   COPD (chronic obstructive pulmonary disease) (HCC)    Coronary artery disease    GERD (gastroesophageal reflux disease)    Gout    Heart murmur    History of kidney stones    Hyperlipidemia    Hypertension    Influenza    February 2019   Mitral regurgitation    Mitral valve prolapse    Pneumonia 1970's   PONV (postoperative nausea and vomiting)    1st knee replacement   Shortness of breath     ALLERGIES:  has No Known Allergies.  MEDICATIONS:  Current Outpatient Medications  Medication Sig Dispense Refill   acetaminophen (TYLENOL) 325 MG tablet Take 650 mg by mouth every 6 (six) hours as needed for mild pain or headache.     allopurinol (ZYLOPRIM) 300 MG tablet Take 150 mg by mouth daily.      aspirin EC 81 MG tablet Take 81 mg by mouth 2 (two) times a week. Swallow whole.     atorvastatin (LIPITOR) 10 MG tablet TAKE 1 TABLET (10 MG TOTAL) BY MOUTH DAILY AT 6 PM. 90 tablet 3   cephALEXin (KEFLEX) 250 MG capsule Take 250 mg by mouth 2 (two) times daily. (Patient not taking: Reported on  07/06/2022)     ciprofloxacin (CIPRO) 500 MG tablet Take 500 mg by mouth 2 (two) times daily. (Patient not taking: Reported on 07/06/2022)     diltiazem (CARDIZEM CD) 120 MG 24 hr capsule Take 1 capsule (120 mg total) by mouth daily. 90 capsule 3   furosemide (LASIX) 20 MG tablet Take 20 mg by mouth as needed for fluid or edema (for worsening leg swelling). Take weight gain of 3 pounds in a day or 5 pounds in a week. (Patient not taking: Reported on 07/06/2022)     lisinopril (ZESTRIL) 20 MG tablet Take 20 mg by mouth daily.     omeprazole (PRILOSEC OTC)  20 MG tablet Take 20 mg by mouth every other day.     polyethylene glycol (MIRALAX / GLYCOLAX) packet Take 17 g by mouth daily as needed for moderate constipation.      predniSONE (DELTASONE) 10 MG tablet Take 10 mg by mouth daily with breakfast. (Patient not taking: Reported on 07/06/2022)     No current facility-administered medications for this visit.    SURGICAL HISTORY:  Past Surgical History:  Procedure Laterality Date   ABDOMINAL ANGIOGRAM  04/30/2011   Procedure: ABDOMINAL ANGIOGRAM;  Surgeon: Runell Gess, MD;  Location: [facility] CATH LAB;  Service: Cardiovascular;;   CARDIAC CATHETERIZATION  04/2011   x2   CARDIAC CATHETERIZATION  08/2009   CATARACT EXTRACTION  2011   bilateral   COLONOSCOPY  05/04/2011   Procedure: COLONOSCOPY;  Surgeon: Theda Belfast, MD;  Location: [facility] ENDOSCOPY;  Service: Endoscopy;  Laterality: N/A;   COLONOSCOPY N/A 07/16/2013   Procedure: COLONOSCOPY;  Surgeon: Theda Belfast, MD;  Location: [facility] ENDOSCOPY;  Service: Endoscopy;  Laterality: N/A;   COLONOSCOPY WITH PROPOFOL N/A 06/20/2017   Procedure: COLONOSCOPY WITH PROPOFOL;  Surgeon: Jeani Hawking, MD;  Location: [facility] ENDOSCOPY;  Service: Endoscopy;  Laterality: N/A;   CORONARY ANGIOGRAM  04/30/2011   Procedure: CORONARY ANGIOGRAM;  Surgeon: Runell Gess, MD;  Location: [facility] CATH LAB;  Service: Cardiovascular;;   ESOPHAGOGASTRODUODENOSCOPY  05/04/2011   Procedure: ESOPHAGOGASTRODUODENOSCOPY (EGD);  Surgeon: Theda Belfast, MD;  Location: [facility] ENDOSCOPY;  Service: Endoscopy;  Laterality: N/A;   EYE SURGERY     bilateral cataracts removed   HERNIA REPAIR  1999&2000   bilateral   LOBECTOMY  1980   left lower lobectomy for benign tumor   OPEN REDUCTION INTERNAL FIXATION (ORIF) DISTAL RADIAL FRACTURE Left 03/19/2017   Procedure: OPEN REDUCTION INTERNAL FIXATION (ORIF) DISTAL RADIAL FRACTURE;  Surgeon: Jodi Geralds, MD;  Location: [facility] OR;  Service: Orthopedics;  Laterality: Left;  AXILLARY BLOCK   POLYPECTOMY   06/20/2017   Procedure: POLYPECTOMY;  Surgeon: Jeani Hawking, MD;  Location: [facility] ENDOSCOPY;  Service: Endoscopy;;   REPLACEMENT TOTAL KNEE  2005   right   RIGHT HEART CATHETERIZATION  04/30/2011   Procedure: RIGHT HEART CATH;  Surgeon: Runell Gess, MD;  Location: [facility] CATH LAB;  Service: Cardiovascular;;   TEE WITHOUT CARDIOVERSION  04/30/2011   Procedure: TRANSESOPHAGEAL ECHOCARDIOGRAM (TEE);  Surgeon: Thurmon Fair, MD;  Location: [facility] ENDOSCOPY;  Service: Cardiovascular;  Laterality: N/A;  3D TEE/ patient will be admitted the day before test , therefo patient will be a inpatient the day of TEE   TOTAL KNEE ARTHROPLASTY Left 02/15/2012   Procedure: TOTAL KNEE ARTHROPLASTY;  Surgeon: Harvie Junior, MD;  Location: [facility] OR;  Service: Orthopedics;  Laterality: Left;    REVIEW OF SYSTEMS:  A comprehensive review of systems was negative except  for: Constitutional: positive for fatigue Musculoskeletal: positive for arthralgias   PHYSICAL EXAMINATION: General appearance: alert, cooperative, fatigued, and no distress Head: Normocephalic, without obvious abnormality, atraumatic Neck: no adenopathy, no JVD, supple, symmetrical, trachea midline, and thyroid not enlarged, symmetric, no tenderness/mass/nodules Lymph nodes: Cervical, supraclavicular, and axillary nodes normal. Resp: clear to auscultation bilaterally Back: symmetric, no curvature. ROM normal. No CVA tenderness. Cardio: regular rate and rhythm, S1, S2 normal, no murmur, click, rub or gallop GI: soft, non-tender; bowel sounds normal; no masses,  no organomegaly Extremities: extremities normal, atraumatic, no cyanosis or edema  ECOG PERFORMANCE STATUS: 1 - Symptomatic but completely ambulatory  Blood pressure (!) 158/69, pulse 77, temperature 97.6 F (36.4 C), temperature source Temporal, resp. rate 16, height 6' (1.829 m), weight 169 lb 14.4 oz (77.1 kg), SpO2 99%.  LABORATORY DATA: Lab Results  Component Value Date   WBC 6.7 11/26/2022    HGB 10.6 (L) 11/26/2022   HCT 31.9 (L) 11/26/2022   MCV 99.7 11/26/2022   PLT 152 11/26/2022      Chemistry      Component Value Date/Time   NA 141 08/17/2020 1119   K 4.9 08/17/2020 1119   CL 110 08/17/2020 1119   CO2 20 (L) 08/17/2020 1119   BUN 45 (H) 08/17/2020 1119   CREATININE 1.86 (H) 08/17/2020 1119      Component Value Date/Time   CALCIUM 9.6 08/17/2020 1119   ALKPHOS 116 08/17/2020 1119   AST 16 08/17/2020 1119   ALT 8 08/17/2020 1119   BILITOT 0.4 08/17/2020 1119       RADIOGRAPHIC STUDIES: No results found.  ASSESSMENT AND PLAN: This is a very pleasant 89 years old white male with history of persistent normocytic anemia likely secondary to chronic kidney disease in addition to iron deficiency secondary to colon AV malformation. The patient was treated with iron infusion with Venofer 300 mg IV weekly for 3 weeks and he tolerated his infusion well.  His last infusion was in January 2024.    Normocytic Anemia Chronic normocytic anemia likely secondary to chronic kidney disease and iron deficiency. AV malformation in the colon causing occasional blood loss. Hemoglobin improved from 9.5 in January to 10.6 today, but still below normal. Iron saturation low at 15%, awaiting ferritin levels to determine need for further iron infusion. Unable to tolerate oral iron supplements due to gastrointestinal upset. - Order ferritin level - Arrange iron infusion if ferritin is low - Follow-up in six months  Chronic Kidney Disease Chronic kidney disease contributing to normocytic anemia. Ongoing monitoring of kidney function and anemia. - Continue monitoring kidney function and anemia  Osteoarthritis Severe osteoarthritis in knees causing significant mobility issues. Uses a walker for ambulation. - Encourage continued use of walker for mobility  Epistaxis Occasional epistaxis following nasal cleaning. No significant bleeding noted elsewhere. - Advise gentle nasal cleaning  to prevent trauma  Follow-up - Follow-up appointment in six months - Call if any issues arise.   The patient was advised to call immediately if he has any concerning symptoms in the interval. The patient voices understanding of current disease status and treatment options and is in agreement with the current care plan.  All questions were answered. The patient knows to call the clinic with any problems, questions or concerns. We can certainly see the patient much sooner if necessary.  The total time spent in the appointment was 20 minutes.  Disclaimer: This note was dictated with voice recognition software. Similar sounding words can inadvertently  be transcribed and may not be corrected upon review.

## 2022-11-27 ENCOUNTER — Other Ambulatory Visit: Payer: PPO

## 2022-11-27 ENCOUNTER — Ambulatory Visit: Payer: PPO | Admitting: Internal Medicine

## 2022-12-17 DIAGNOSIS — G9331 Postviral fatigue syndrome: Secondary | ICD-10-CM | POA: Diagnosis not present

## 2022-12-17 DIAGNOSIS — R058 Other specified cough: Secondary | ICD-10-CM | POA: Diagnosis not present

## 2022-12-17 DIAGNOSIS — J90 Pleural effusion, not elsewhere classified: Secondary | ICD-10-CM | POA: Diagnosis not present

## 2022-12-17 DIAGNOSIS — J40 Bronchitis, not specified as acute or chronic: Secondary | ICD-10-CM | POA: Diagnosis not present

## 2022-12-21 DIAGNOSIS — I700 Atherosclerosis of aorta: Secondary | ICD-10-CM | POA: Diagnosis not present

## 2022-12-21 DIAGNOSIS — M150 Primary generalized (osteo)arthritis: Secondary | ICD-10-CM | POA: Diagnosis not present

## 2022-12-21 DIAGNOSIS — I2510 Atherosclerotic heart disease of native coronary artery without angina pectoris: Secondary | ICD-10-CM | POA: Diagnosis not present

## 2022-12-21 DIAGNOSIS — I5042 Chronic combined systolic (congestive) and diastolic (congestive) heart failure: Secondary | ICD-10-CM | POA: Diagnosis not present

## 2022-12-21 DIAGNOSIS — E782 Mixed hyperlipidemia: Secondary | ICD-10-CM | POA: Diagnosis not present

## 2022-12-21 DIAGNOSIS — I130 Hypertensive heart and chronic kidney disease with heart failure and stage 1 through stage 4 chronic kidney disease, or unspecified chronic kidney disease: Secondary | ICD-10-CM | POA: Diagnosis not present

## 2022-12-21 DIAGNOSIS — D500 Iron deficiency anemia secondary to blood loss (chronic): Secondary | ICD-10-CM | POA: Diagnosis not present

## 2022-12-21 DIAGNOSIS — R058 Other specified cough: Secondary | ICD-10-CM | POA: Diagnosis not present

## 2022-12-21 DIAGNOSIS — J40 Bronchitis, not specified as acute or chronic: Secondary | ICD-10-CM | POA: Diagnosis not present

## 2022-12-21 DIAGNOSIS — N1832 Chronic kidney disease, stage 3b: Secondary | ICD-10-CM | POA: Diagnosis not present

## 2022-12-21 DIAGNOSIS — G9331 Postviral fatigue syndrome: Secondary | ICD-10-CM | POA: Diagnosis not present

## 2022-12-21 DIAGNOSIS — I495 Sick sinus syndrome: Secondary | ICD-10-CM | POA: Diagnosis not present

## 2023-01-24 DIAGNOSIS — I130 Hypertensive heart and chronic kidney disease with heart failure and stage 1 through stage 4 chronic kidney disease, or unspecified chronic kidney disease: Secondary | ICD-10-CM | POA: Diagnosis not present

## 2023-01-24 DIAGNOSIS — D500 Iron deficiency anemia secondary to blood loss (chronic): Secondary | ICD-10-CM | POA: Diagnosis not present

## 2023-01-24 DIAGNOSIS — R7303 Prediabetes: Secondary | ICD-10-CM | POA: Diagnosis not present

## 2023-01-30 DIAGNOSIS — I6521 Occlusion and stenosis of right carotid artery: Secondary | ICD-10-CM | POA: Diagnosis not present

## 2023-01-30 DIAGNOSIS — M150 Primary generalized (osteo)arthritis: Secondary | ICD-10-CM | POA: Diagnosis not present

## 2023-01-30 DIAGNOSIS — N1832 Chronic kidney disease, stage 3b: Secondary | ICD-10-CM | POA: Diagnosis not present

## 2023-01-30 DIAGNOSIS — I495 Sick sinus syndrome: Secondary | ICD-10-CM | POA: Diagnosis not present

## 2023-01-30 DIAGNOSIS — R7303 Prediabetes: Secondary | ICD-10-CM | POA: Diagnosis not present

## 2023-01-30 DIAGNOSIS — N400 Enlarged prostate without lower urinary tract symptoms: Secondary | ICD-10-CM | POA: Diagnosis not present

## 2023-01-30 DIAGNOSIS — I700 Atherosclerosis of aorta: Secondary | ICD-10-CM | POA: Diagnosis not present

## 2023-01-30 DIAGNOSIS — I2510 Atherosclerotic heart disease of native coronary artery without angina pectoris: Secondary | ICD-10-CM | POA: Diagnosis not present

## 2023-01-30 DIAGNOSIS — I129 Hypertensive chronic kidney disease with stage 1 through stage 4 chronic kidney disease, or unspecified chronic kidney disease: Secondary | ICD-10-CM | POA: Diagnosis not present

## 2023-01-30 DIAGNOSIS — D500 Iron deficiency anemia secondary to blood loss (chronic): Secondary | ICD-10-CM | POA: Diagnosis not present

## 2023-01-30 DIAGNOSIS — I5042 Chronic combined systolic (congestive) and diastolic (congestive) heart failure: Secondary | ICD-10-CM | POA: Diagnosis not present

## 2023-01-30 DIAGNOSIS — E782 Mixed hyperlipidemia: Secondary | ICD-10-CM | POA: Diagnosis not present

## 2023-02-12 ENCOUNTER — Other Ambulatory Visit: Payer: Self-pay | Admitting: Physician Assistant

## 2023-02-13 ENCOUNTER — Encounter: Payer: Self-pay | Admitting: Internal Medicine

## 2023-02-26 DIAGNOSIS — N183 Chronic kidney disease, stage 3 unspecified: Secondary | ICD-10-CM | POA: Diagnosis not present

## 2023-03-05 DIAGNOSIS — R809 Proteinuria, unspecified: Secondary | ICD-10-CM | POA: Diagnosis not present

## 2023-03-05 DIAGNOSIS — I10 Essential (primary) hypertension: Secondary | ICD-10-CM | POA: Diagnosis not present

## 2023-03-05 DIAGNOSIS — E872 Acidosis, unspecified: Secondary | ICD-10-CM | POA: Diagnosis not present

## 2023-03-05 DIAGNOSIS — I129 Hypertensive chronic kidney disease with stage 1 through stage 4 chronic kidney disease, or unspecified chronic kidney disease: Secondary | ICD-10-CM | POA: Diagnosis not present

## 2023-03-05 DIAGNOSIS — N183 Chronic kidney disease, stage 3 unspecified: Secondary | ICD-10-CM | POA: Diagnosis not present

## 2023-03-05 DIAGNOSIS — N400 Enlarged prostate without lower urinary tract symptoms: Secondary | ICD-10-CM | POA: Diagnosis not present

## 2023-03-14 DIAGNOSIS — E782 Mixed hyperlipidemia: Secondary | ICD-10-CM | POA: Diagnosis not present

## 2023-03-14 DIAGNOSIS — I2510 Atherosclerotic heart disease of native coronary artery without angina pectoris: Secondary | ICD-10-CM | POA: Diagnosis not present

## 2023-03-14 DIAGNOSIS — M150 Primary generalized (osteo)arthritis: Secondary | ICD-10-CM | POA: Diagnosis not present

## 2023-03-14 DIAGNOSIS — N1832 Chronic kidney disease, stage 3b: Secondary | ICD-10-CM | POA: Diagnosis not present

## 2023-03-14 DIAGNOSIS — I129 Hypertensive chronic kidney disease with stage 1 through stage 4 chronic kidney disease, or unspecified chronic kidney disease: Secondary | ICD-10-CM | POA: Diagnosis not present

## 2023-03-14 DIAGNOSIS — N400 Enlarged prostate without lower urinary tract symptoms: Secondary | ICD-10-CM | POA: Diagnosis not present

## 2023-03-14 DIAGNOSIS — I495 Sick sinus syndrome: Secondary | ICD-10-CM | POA: Diagnosis not present

## 2023-03-14 DIAGNOSIS — R7303 Prediabetes: Secondary | ICD-10-CM | POA: Diagnosis not present

## 2023-03-14 DIAGNOSIS — I700 Atherosclerosis of aorta: Secondary | ICD-10-CM | POA: Diagnosis not present

## 2023-03-14 DIAGNOSIS — I6521 Occlusion and stenosis of right carotid artery: Secondary | ICD-10-CM | POA: Diagnosis not present

## 2023-03-14 DIAGNOSIS — D500 Iron deficiency anemia secondary to blood loss (chronic): Secondary | ICD-10-CM | POA: Diagnosis not present

## 2023-03-14 DIAGNOSIS — I5042 Chronic combined systolic (congestive) and diastolic (congestive) heart failure: Secondary | ICD-10-CM | POA: Diagnosis not present

## 2023-03-15 ENCOUNTER — Encounter: Payer: Self-pay | Admitting: Internal Medicine

## 2023-03-18 ENCOUNTER — Telehealth: Payer: Self-pay | Admitting: *Deleted

## 2023-03-18 ENCOUNTER — Encounter: Payer: Self-pay | Admitting: Internal Medicine

## 2023-03-18 NOTE — Telephone Encounter (Signed)
 Spoke with patient and sent scheduling request to add visit with Dr Arbutus Ped first available.

## 2023-03-18 NOTE — Telephone Encounter (Signed)
 Patient called to report increased weakness and stated that he had labs done 03/14/2023 at Dr Florene Glen.  Patient noted that his hgb has dropped and feels like he might be due for an Iron Infusion.  He Hbg on 01/30/2023 was 10.8 and on 03/14/2023 it dropped to 8.6.    He has a follow up with Dr Florene Glen on 03/21/2023 to review the lab results but wanted to report this to Dr Arbutus Ped and find out if he needs any infusions based on his lab findings and increased weakness.  Routing to Dr Arbutus Ped to review and advise.

## 2023-03-19 ENCOUNTER — Other Ambulatory Visit: Payer: Self-pay | Admitting: Physician Assistant

## 2023-03-19 ENCOUNTER — Telehealth: Payer: Self-pay | Admitting: Internal Medicine

## 2023-03-19 ENCOUNTER — Telehealth: Payer: Self-pay

## 2023-03-19 DIAGNOSIS — D509 Iron deficiency anemia, unspecified: Secondary | ICD-10-CM

## 2023-03-19 NOTE — Telephone Encounter (Signed)
 Spoke with patient this morning about appts.  Informed patient that [name] sent a scheduling message yesterday and someone will call with first available appt. Patient verbalized understanding.

## 2023-03-19 NOTE — Telephone Encounter (Signed)
 Rescheduled appointments per patient request. The patient is aware of the appointment details.

## 2023-03-19 NOTE — Progress Notes (Unsigned)
 [facility] OFFICE PROGRESS NOTE  Georgianne Fick, MD [address] [place] [address]  DIAGNOSIS: Persistent normocytic anemia likely secondary to chronic kidney disease in addition to iron deficiency secondary to colon AV malformation.   PRIOR THERAPY: Intolerance or oral iron supplements   CURRENT THERAPY: 1)  Iron infusion with Venofer 300 mg IV weekly for 3 weeks. Last infusion was given on 02/02/2022 2) Arenesp 300 mcg every 3 weeks, first dose in 3 weeks.   INTERVAL HISTORY: [name] 89 y.o. male returns to the clinic today for follow-up visit accompanied by his wife.  The patient was last seen in clinic by Dr. Arbutus Ped on 11/26/2023.  The patient has normocytic anemia secondary to chronic kidney disease as well as iron deficiency anemia secondary to AV malformations. He has a nephrologist as well that he saw recently. He was last seen in November 2024.  In the interval since being seen, the patient saw his PCP who performed repeat blood work showing his hemoglobin dropping from 10.8-8.6 from 01/30/2023 to 03/14/2023.  The patient is symptomatic with fatigue, poor energy, and weakness which has worsened over the past  several weeks. He denies any visible bleeding or bruising. Mild nose seeping. Short time. Has not need blood in the stool. Black  never had black stool dark brown.  He is not able to tolerate any oral iron supplements due to GI upset.  He has received IV iron in the past, the most recent approximately 1 year ago on 02/02/2022.   He denies lightheadedness and dizziness. He has some dyspnea on exertion. Due to his age the patient states he is not a candidate for endoscopic evaluation.  He had some mild nosebleeding that did not last very long.  He denies any gingival bleeding, hemoptysis, hematemesis, melena, or hematochezia.  He last saw Dr. Elnoria Howard in 2019.  The patient takes an aspirin 2 days a week.  He is here today for evaluation  and repeat blood work.  MEDICAL HISTORY: Past Medical History:  Diagnosis Date   Anemia    low hemoglobin   Arthritis    AV (angiodysplasia malformation of colon)    Benign prostatic hypertrophy    Cancer (HCC)    basal cell cancer   Chronic kidney disease    elevated Cr, BUN, Dr. Caryn Section manages   COPD (chronic obstructive pulmonary disease) (HCC)    Coronary artery disease    GERD (gastroesophageal reflux disease)    Gout    Heart murmur    History of kidney stones    Hyperlipidemia    Hypertension    Influenza    February 2019   Mitral regurgitation    Mitral valve prolapse    Pneumonia 1970's   PONV (postoperative nausea and vomiting)    1st knee replacement   Shortness of breath     ALLERGIES:  has no known allergies.  MEDICATIONS:  Current Outpatient Medications  Medication Sig Dispense Refill   acetaminophen (TYLENOL) 325 MG tablet Take 650 mg by mouth every 6 (six) hours as needed for mild pain or headache.     allopurinol (ZYLOPRIM) 300 MG tablet Take 150 mg by mouth daily.      aspirin EC 81 MG tablet Take 81 mg by mouth 2 (two) times a week. Swallow whole.     atorvastatin (LIPITOR) 10 MG tablet TAKE 1 TABLET (10 MG TOTAL) BY MOUTH DAILY AT 6 PM. 90 tablet 3   cephALEXin (  KEFLEX) 250 MG capsule Take 250 mg by mouth 2 (two) times daily. (Patient not taking: Reported on 07/06/2022)     ciprofloxacin (CIPRO) 500 MG tablet Take 500 mg by mouth 2 (two) times daily. (Patient not taking: Reported on 07/06/2022)     diltiazem (CARDIZEM CD) 120 MG 24 hr capsule Take 1 capsule (120 mg total) by mouth daily. Please call [phone] to schedule a  July appointment for future refills. Thank you. 90 capsule 1   furosemide (LASIX) 20 MG tablet Take 20 mg by mouth as needed for fluid or edema (for worsening leg swelling). Take weight gain of 3 pounds in a day or 5 pounds in a week. (Patient not taking: Reported on 07/06/2022)     lisinopril (ZESTRIL) 20 MG tablet Take 20 mg by mouth  daily.     omeprazole (PRILOSEC OTC) 20 MG tablet Take 20 mg by mouth every other day.     polyethylene glycol (MIRALAX / GLYCOLAX) packet Take 17 g by mouth daily as needed for moderate constipation.      predniSONE (DELTASONE) 10 MG tablet Take 10 mg by mouth daily with breakfast. (Patient not taking: Reported on 07/06/2022)     No current facility-administered medications for this visit.   Facility-Administered Medications Ordered in Other Visits  Medication Dose Route Frequency Provider Last Rate Last Admin   0.9 %  sodium chloride infusion (Manually program via Guardrails IV Fluids)  250 mL Intravenous Continuous Lezlie Ritchey L, PA-C       acetaminophen (TYLENOL) tablet 650 mg  650 mg Oral Once Lijah Bourque L, PA-C       diphenhydrAMINE (BENADRYL) capsule 25 mg  25 mg Oral Once Shyloh Krinke L, PA-C        SURGICAL HISTORY:  Past Surgical History:  Procedure Laterality Date   ABDOMINAL ANGIOGRAM  04/30/2011   Procedure: ABDOMINAL ANGIOGRAM;  Surgeon: Runell Gess, MD;  Location: [facility] CATH LAB;  Service: Cardiovascular;;   CARDIAC CATHETERIZATION  04/2011   x2   CARDIAC CATHETERIZATION  08/2009   CATARACT EXTRACTION  2011   bilateral   COLONOSCOPY  05/04/2011   Procedure: COLONOSCOPY;  Surgeon: Theda Belfast, MD;  Location: [facility] ENDOSCOPY;  Service: Endoscopy;  Laterality: N/A;   COLONOSCOPY N/A 07/16/2013   Procedure: COLONOSCOPY;  Surgeon: Theda Belfast, MD;  Location: [facility] ENDOSCOPY;  Service: Endoscopy;  Laterality: N/A;   COLONOSCOPY WITH PROPOFOL N/A 06/20/2017   Procedure: COLONOSCOPY WITH PROPOFOL;  Surgeon: Jeani Hawking, MD;  Location: [facility] ENDOSCOPY;  Service: Endoscopy;  Laterality: N/A;   CORONARY ANGIOGRAM  04/30/2011   Procedure: CORONARY ANGIOGRAM;  Surgeon: Runell Gess, MD;  Location: [facility] CATH LAB;  Service: Cardiovascular;;   ESOPHAGOGASTRODUODENOSCOPY  05/04/2011   Procedure: ESOPHAGOGASTRODUODENOSCOPY (EGD);  Surgeon: Theda Belfast,  MD;  Location: [facility] ENDOSCOPY;  Service: Endoscopy;  Laterality: N/A;   EYE SURGERY     bilateral cataracts removed   HERNIA REPAIR  1999&2000   bilateral   LOBECTOMY  1980   left lower lobectomy for benign tumor   OPEN REDUCTION INTERNAL FIXATION (ORIF) DISTAL RADIAL FRACTURE Left 03/19/2017   Procedure: OPEN REDUCTION INTERNAL FIXATION (ORIF) DISTAL RADIAL FRACTURE;  Surgeon: Jodi Geralds, MD;  Location: [facility] OR;  Service: Orthopedics;  Laterality: Left;  AXILLARY BLOCK   POLYPECTOMY  06/20/2017   Procedure: POLYPECTOMY;  Surgeon: Jeani Hawking, MD;  Location: [facility] ENDOSCOPY;  Service: Endoscopy;;   REPLACEMENT TOTAL KNEE  2005   right   RIGHT HEART  CATHETERIZATION  04/30/2011   Procedure: RIGHT HEART CATH;  Surgeon: Runell Gess, MD;  Location[facility] C[facility] LAB;  Service: Cardiovascular;;   TEE WITHOUT CARDIOVERSION  04/30/2011   Procedure: TRANSESOPHAGEAL ECHOCARDIOGRAM (TEE);  Surgeon: Thurmon Fair, MD;  Location: [facility] ENDOSCOPY;  Service: Cardiovascular;  Laterality: N/A;  3D TEE/ patient will be admitted the day before test , therefo patient will be a inpatient the day of TEE   TOTAL KNEE ARTHROPLASTY Left 02/15/2012   Procedure: TOTAL KNEE ARTHROPLASTY;  Surgeon: Harvie Junior, MD;  Location: [facility] OR;  Service: Orthopedics;  Laterality: Left;    REVIEW OF SYSTEMS:   Review of Systems  Constitutional: Positive for fatigue, generalized weakness, and low energy.  Negative for appetite change, chills, fever and unexpected weight change.  HENT: Positive for mild nosebleeding.  Negative for mouth sores, nosebleeds, sore throat and trouble swallowing.   Eyes: Negative for eye problems and icterus.  Respiratory: Positive for dyspnea on exertion.  Negative for cough, hemoptysis, and wheezing.   Cardiovascular: Negative for chest pain and leg swelling.  Gastrointestinal: Negative for abdominal pain, constipation, diarrhea, nausea and vomiting.  Genitourinary: Negative for bladder incontinence,  difficulty urinating, dysuria, frequency and hematuria.   Musculoskeletal: Negative for back pain, gait problem, neck pain and neck stiffness.  Skin: Negative for itching and rash.  Neurological: Negative for dizziness, extremity weakness, gait problem, headaches, light-headedness and seizures.  Hematological: Negative for adenopathy. Does not bleed easily.  Positive for easy bruising on his extremities. Psychiatric/Behavioral: Negative for confusion, depression and sleep disturbance. The patient is not nervous/anxious.     PHYSICAL EXAMINATION:  Blood pressure (!) 153/58, pulse (!) 59, temperature (!) 97.3 F (36.3 C), temperature source Temporal, resp. rate 20, weight 174 lb 11.2 oz (79.2 kg), SpO2 100%.  ECOG PERFORMANCE STATUS: 1-2  Physical Exam  Constitutional: Oriented to person, place, and time and well-developed, well-nourished, and in no distress. HENT:  Head: Normocephalic and atraumatic.  Mouth/Throat: Oropharynx is clear and moist. No oropharyngeal exudate.  Eyes: Conjunctivae are normal. Right eye exhibits no discharge. Left eye exhibits no discharge. No scleral icterus.  Neck: Normal range of motion. Neck supple.  Cardiovascular: Normal rate, regular rhythm, normal heart sounds and intact distal pulses.   Pulmonary/Chest: Effort normal and breath sounds normal. No respiratory distress. No wheezes. No rales.  Abdominal: Soft. Bowel sounds are normal. Exhibits no distension and no mass. There is no tenderness.  Musculoskeletal: Normal range of motion. Exhibits no edema.  Lymphadenopathy:    No cervical adenopathy.  Neurological: Alert and oriented to person, place, and time. Exhibits also wasting.  Ambulates with a walker  skin: Skin is warm and dry. No rash noted. Not diaphoretic. No erythema.  Positive for pallor and senile purpura  psychiatric: Mood, memory and judgment normal.  Vitals reviewed.  LABORATORY DATA: Lab Results  Component Value Date   WBC 4.5  03/20/2023   HGB 7.8 (L) 03/20/2023   HCT 24.4 (L) 03/20/2023   MCV 94.2 03/20/2023   PLT 152 03/20/2023      Chemistry      Component Value Date/Time   NA 141 08/17/2020 1119   K 4.9 08/17/2020 1119   CL 110 08/17/2020 1119   CO2 20 (L) 08/17/2020 1119   BUN 45 (H) 08/17/2020 1119   CREATININE 1.86 (H) 08/17/2020 1119      Component Value Date/Time   CALCIUM 9.6 08/17/2020 1119   ALKPHOS 116 08/17/2020 1119   AST 16 08/17/2020 1119  ALT 8 08/17/2020 1119   BILITOT 0.4 08/17/2020 1119       RADIOGRAPHIC STUDIES:  No results found.   ASSESSMENT/PLAN:  Is a very pleasant 89 year old Caucasian male with a history of normocytic anemia secondary to chronic kidney disease in addition to iron deficiency anemia secondary to colon AVMs.  The patient is not able to tolerate oral iron supplements.  He has received IV iron in the past with Venofer 300 mg weekly x 3.  The most recent dose was on 02/02/2022.  The patient had lab work performed at his PCPs office recently which showed his hemoglobin dropping from the tens to the eights.  Therefore he is here today for repeat blood work and recommendations.  He had a repeat CBC, iron studies, and ferritin drawn today.  His labs today show worsening anemia with a hemoglobin of 7.8.  His iron studies also show low iron.  Was seen with Dr. Arbutus Ped today.  Dr. Arbutus Ped revisited Aranesp 300 mg injections every 3 weeks given his chronic kidney disease.  The patient is interested in this option and we will arrange for this to start in the next few weeks.  Will arrange for 1 unit of blood to be administered today  I will arrange for IV iron infusions at the [facility] infusion center with 300 mg of Venofer weekly x 3.  We will see him back for follow-up visit in 2 months with repeat blood work.  I have standing orders in for sample to blood bank.  The patient was advised to call immediately if he has any concerning symptoms in the  interval. The patient voices understanding of current disease status and treatment options and is in agreement with the current care plan. All questions were answered. The patient knows to call the clinic with any problems, questions or concerns. We can certainly see the patient much sooner if necessary    Orders Placed This Encounter  Procedures   CBC with Differential (Cancer Center Only)    Standing Status:   Future    Expected Date:   06/20/2023    Expiration Date:   03/19/2024   Iron and Iron Binding Capacity ([facility] only)    Standing Status:   Future    Expected Date:   06/20/2023    Expiration Date:   03/19/2024   Ferritin    Standing Status:   Future    Expected Date:   06/20/2023    Expiration Date:   03/19/2024   CBC with Differential (Cancer Center Only)    Standing Status:   Standing    Number of Occurrences:   12    Expiration Date:   03/19/2024   Care order/instruction    Transfuse Parameters    Standing Status:   Future    Expiration Date:   03/19/2024   Type and screen         Standing Status:   Future    Number of Occurrences:   1    Expected Date:   03/20/2023    Expiration Date:   03/19/2024   Prepare RBC (crossmatch)    Standing Status:   Standing    Number of Occurrences:   1    # of Units:   1 unit    Transfusion Indications:   Other    Comments:   Anemia    Special Requirements:   Other (specify)    Number of Units to Keep Ahead:   NO units ahead  If emergent release call blood bank:   [facility] [phone]   Sample to Blood Bank    Standing Status:   Future    Expected Date:   06/20/2023    Expiration Date:   03/19/2024   Sample to Blood Bank    Standing Status:   Standing    Number of Occurrences:   12    Expiration Date:   03/19/2024       Hiilani Jetter L Amazing Cowman, PA-C 03/20/23  ADDENDUM: Hematology/Oncology Attending: I had a face-to-face encounter with the patient today.  I reviewed his record, lab and recommended his care plan.   This is a very pleasant 89 years old white male with normocytic anemia consistent with anemia of chronic kidney disease in addition to iron deficiency secondary to colon AV malformation.  The patient has intolerance to the oral iron tablets and he has been treated in the past with iron infusion with Venofer several times last infusion was and February 2024.  He presented today with worsening anemia and hemoglobin was down to 7.8 and hematocrit 24.4%.  His ferritin level was normal at 24.  Iron studies showed low serum iron of 25 with iron saturation of 5% and TIBC of 465.  He has normal vitamin B12 level.  I had a lengthy discussion with the patient today about his current condition and treatment options. For the symptomatic anemia we will arrange for the patient to receive 1 unit of PRBCs transfusion. For the anemia of chronic kidney disease I offered the patient treatment with Aranesp as a erythrocyte stimulating factor and he is interested in this option. He will be treated with Aranesp 300 mcg subcutaneously every 3 weeks. For the iron deficiency we will arrange for the patient to receive iron infusion with Venofer. We will see the patient back for follow-up visit in 2 months for evaluation and repeat blood work. He was advised to call immediately if he has any other concerning symptoms in the interval. The total time spent in the appointment was 30 minutes. Disclaimer: This note was dictated with voice recognition software. Similar sounding words can inadvertently be transcribed and may be missed upon review. Lajuana Matte, MD

## 2023-03-20 ENCOUNTER — Telehealth: Payer: Self-pay | Admitting: Pharmacy Technician

## 2023-03-20 ENCOUNTER — Inpatient Hospital Stay (HOSPITAL_BASED_OUTPATIENT_CLINIC_OR_DEPARTMENT_OTHER): Admitting: Physician Assistant

## 2023-03-20 ENCOUNTER — Inpatient Hospital Stay

## 2023-03-20 ENCOUNTER — Inpatient Hospital Stay: Attending: Physician Assistant

## 2023-03-20 VITALS — BP 153/58 | HR 59 | Temp 97.3°F | Resp 20 | Wt 174.7 lb

## 2023-03-20 DIAGNOSIS — D649 Anemia, unspecified: Secondary | ICD-10-CM

## 2023-03-20 DIAGNOSIS — D509 Iron deficiency anemia, unspecified: Secondary | ICD-10-CM | POA: Diagnosis not present

## 2023-03-20 DIAGNOSIS — D631 Anemia in chronic kidney disease: Secondary | ICD-10-CM | POA: Insufficient documentation

## 2023-03-20 DIAGNOSIS — N189 Chronic kidney disease, unspecified: Secondary | ICD-10-CM | POA: Diagnosis not present

## 2023-03-20 LAB — CBC WITH DIFFERENTIAL (CANCER CENTER ONLY)
Abs Immature Granulocytes: 0.02 10*3/uL (ref 0.00–0.07)
Basophils Absolute: 0 10*3/uL (ref 0.0–0.1)
Basophils Relative: 0 %
Eosinophils Absolute: 0.1 10*3/uL (ref 0.0–0.5)
Eosinophils Relative: 1 %
HCT: 24.4 % — ABNORMAL LOW (ref 39.0–52.0)
Hemoglobin: 7.8 g/dL — ABNORMAL LOW (ref 13.0–17.0)
Immature Granulocytes: 0 %
Lymphocytes Relative: 15 %
Lymphs Abs: 0.7 10*3/uL (ref 0.7–4.0)
MCH: 30.1 pg (ref 26.0–34.0)
MCHC: 32 g/dL (ref 30.0–36.0)
MCV: 94.2 fL (ref 80.0–100.0)
Monocytes Absolute: 0.5 10*3/uL (ref 0.1–1.0)
Monocytes Relative: 10 %
Neutro Abs: 3.3 10*3/uL (ref 1.7–7.7)
Neutrophils Relative %: 74 %
Platelet Count: 152 10*3/uL (ref 150–400)
RBC: 2.59 MIL/uL — ABNORMAL LOW (ref 4.22–5.81)
RDW: 15.7 % — ABNORMAL HIGH (ref 11.5–15.5)
WBC Count: 4.5 10*3/uL (ref 4.0–10.5)
nRBC: 0.7 % — ABNORMAL HIGH (ref 0.0–0.2)

## 2023-03-20 LAB — FERRITIN: Ferritin: 24 ng/mL (ref 24–336)

## 2023-03-20 LAB — IRON AND IRON BINDING CAPACITY (CC-WL,HP ONLY)
Iron: 25 ug/dL — ABNORMAL LOW (ref 45–182)
Saturation Ratios: 5 % — ABNORMAL LOW (ref 17.9–39.5)
TIBC: 465 ug/dL — ABNORMAL HIGH (ref 250–450)
UIBC: 440 ug/dL — ABNORMAL HIGH (ref 117–376)

## 2023-03-20 LAB — SAMPLE TO BLOOD BANK

## 2023-03-20 LAB — VITAMIN B12: Vitamin B-12: 360 pg/mL (ref 180–914)

## 2023-03-20 LAB — PREPARE RBC (CROSSMATCH)

## 2023-03-20 MED ORDER — ACETAMINOPHEN 325 MG PO TABS
650.0000 mg | ORAL_TABLET | Freq: Once | ORAL | Status: AC
  Administered 2023-03-20: 650 mg via ORAL

## 2023-03-20 MED ORDER — SODIUM CHLORIDE 0.9% IV SOLUTION
250.0000 mL | INTRAVENOUS | Status: DC
  Administered 2023-03-20: 250 mL via INTRAVENOUS

## 2023-03-20 MED ORDER — DIPHENHYDRAMINE HCL 25 MG PO CAPS
25.0000 mg | ORAL_CAPSULE | Freq: Once | ORAL | Status: AC
  Administered 2023-03-20: 25 mg via ORAL

## 2023-03-20 NOTE — Progress Notes (Signed)
 Spoke with blood bank and orders received.

## 2023-03-20 NOTE — Telephone Encounter (Signed)
 Auth Submission: NO AUTH NEEDED Site of care: Site of care: [facility] Payer: [employer] AD[employer] Medication & CPT/J Code(s) submitted: Venofer (Iron Sucrose) J1756 Route of submission (phone, fax, portal):  Phone # Fax # Auth type: Buy/Bill PB Units/visits requested: 5 DOSES Reference number:  Approval from: 03/20/23 to 08/20/23

## 2023-03-21 ENCOUNTER — Telehealth: Payer: Self-pay | Admitting: Internal Medicine

## 2023-03-21 DIAGNOSIS — R7303 Prediabetes: Secondary | ICD-10-CM | POA: Diagnosis not present

## 2023-03-21 DIAGNOSIS — N400 Enlarged prostate without lower urinary tract symptoms: Secondary | ICD-10-CM | POA: Diagnosis not present

## 2023-03-21 DIAGNOSIS — I129 Hypertensive chronic kidney disease with stage 1 through stage 4 chronic kidney disease, or unspecified chronic kidney disease: Secondary | ICD-10-CM | POA: Diagnosis not present

## 2023-03-21 DIAGNOSIS — I2510 Atherosclerotic heart disease of native coronary artery without angina pectoris: Secondary | ICD-10-CM | POA: Diagnosis not present

## 2023-03-21 DIAGNOSIS — I130 Hypertensive heart and chronic kidney disease with heart failure and stage 1 through stage 4 chronic kidney disease, or unspecified chronic kidney disease: Secondary | ICD-10-CM | POA: Diagnosis not present

## 2023-03-21 DIAGNOSIS — I495 Sick sinus syndrome: Secondary | ICD-10-CM | POA: Diagnosis not present

## 2023-03-21 DIAGNOSIS — I5042 Chronic combined systolic (congestive) and diastolic (congestive) heart failure: Secondary | ICD-10-CM | POA: Diagnosis not present

## 2023-03-21 DIAGNOSIS — M150 Primary generalized (osteo)arthritis: Secondary | ICD-10-CM | POA: Diagnosis not present

## 2023-03-21 DIAGNOSIS — I6521 Occlusion and stenosis of right carotid artery: Secondary | ICD-10-CM | POA: Diagnosis not present

## 2023-03-21 DIAGNOSIS — E782 Mixed hyperlipidemia: Secondary | ICD-10-CM | POA: Diagnosis not present

## 2023-03-21 DIAGNOSIS — D500 Iron deficiency anemia secondary to blood loss (chronic): Secondary | ICD-10-CM | POA: Diagnosis not present

## 2023-03-21 DIAGNOSIS — N1832 Chronic kidney disease, stage 3b: Secondary | ICD-10-CM | POA: Diagnosis not present

## 2023-03-21 LAB — TYPE AND SCREEN
ABO/RH(D): O NEG
Antibody Screen: NEGATIVE
Unit division: 0

## 2023-03-21 LAB — BPAM RBC
Blood Product Expiration Date: 202503212359
ISSUE DATE / TIME: 202503191132
Unit Type and Rh: 9500

## 2023-03-21 NOTE — Telephone Encounter (Signed)
 Contacted the patient to schedule out appointments. The patient is aware of the appointment details and will be mailed appointment reminders. The patient wanted to get more info on pricing as well but wanted to go ahead and schedule.

## 2023-03-25 ENCOUNTER — Ambulatory Visit (INDEPENDENT_AMBULATORY_CARE_PROVIDER_SITE_OTHER)

## 2023-03-25 VITALS — BP 145/65 | HR 57 | Temp 96.6°F | Resp 18 | Ht 72.0 in | Wt 174.6 lb

## 2023-03-25 DIAGNOSIS — D509 Iron deficiency anemia, unspecified: Secondary | ICD-10-CM | POA: Diagnosis not present

## 2023-03-25 MED ORDER — ACETAMINOPHEN 325 MG PO TABS
650.0000 mg | ORAL_TABLET | Freq: Once | ORAL | Status: AC
  Administered 2023-03-25: 650 mg via ORAL
  Filled 2023-03-25: qty 2

## 2023-03-25 MED ORDER — LORATADINE 10 MG PO TABS
10.0000 mg | ORAL_TABLET | Freq: Every day | ORAL | Status: DC
  Administered 2023-03-25: 10 mg via ORAL
  Filled 2023-03-25: qty 1

## 2023-03-25 MED ORDER — IRON SUCROSE 20 MG/ML IV SOLN
200.0000 mg | Freq: Once | INTRAVENOUS | Status: AC
  Administered 2023-03-25: 200 mg via INTRAVENOUS
  Filled 2023-03-25: qty 10

## 2023-03-25 NOTE — Progress Notes (Signed)
 Diagnosis: Iron Deficiency Anemia  Provider:  Chilton Greathouse MD  Procedure: IV Push  IV Type: Peripheral, IV Location: L Upper Arm  Venofer (Iron Sucrose), Dose: 200 mg  Post Infusion IV Care: Observation period completed and Peripheral IV Discontinued  Discharge: Condition: Good, Destination: Home . AVS Provided  Performed by:  Wyvonne Lenz, RN

## 2023-03-26 ENCOUNTER — Other Ambulatory Visit

## 2023-03-26 ENCOUNTER — Ambulatory Visit: Admitting: Physician Assistant

## 2023-03-27 ENCOUNTER — Telehealth: Payer: Self-pay

## 2023-03-27 ENCOUNTER — Ambulatory Visit

## 2023-03-27 VITALS — BP 120/57 | HR 51 | Temp 96.4°F | Resp 20 | Ht 72.0 in | Wt 175.8 lb

## 2023-03-27 DIAGNOSIS — D509 Iron deficiency anemia, unspecified: Secondary | ICD-10-CM | POA: Diagnosis not present

## 2023-03-27 MED ORDER — ACETAMINOPHEN 325 MG PO TABS: 650.0000 mg | ORAL_TABLET | Freq: Once | ORAL | Status: DC

## 2023-03-27 MED ORDER — LORATADINE 10 MG PO TABS
10.0000 mg | ORAL_TABLET | Freq: Every day | ORAL | Status: DC
  Administered 2023-03-27: 10 mg via ORAL
  Filled 2023-03-27: qty 1

## 2023-03-27 MED ORDER — IRON SUCROSE 20 MG/ML IV SOLN
200.0000 mg | Freq: Once | INTRAVENOUS | Status: AC
  Administered 2023-03-27: 200 mg via INTRAVENOUS
  Filled 2023-03-27: qty 10

## 2023-03-27 NOTE — Progress Notes (Signed)
 Diagnosis: Acute Anemia  Provider:  Chilton Greathouse MD  Procedure: IV Push  IV Type: Peripheral, IV Location: L Forearm  Venofer (Iron Sucrose), Dose: 200 mg  Post Infusion IV Care: Observation period completed and Peripheral IV Discontinued  Discharge: Condition: Good, Destination: Home . AVS Declined  Performed by:  Nat Math, RN

## 2023-03-27 NOTE — Telephone Encounter (Signed)
 Patient reports aranesp injection scheduled 4/10.  Patient stated that he wasn't sure that he wanted to proceed with injection due to cost.  Patient stated he called his insurance and said he would be responsible for 0-20% of total cost but doesn't know the total cost.  Informed patient I would try to find out and give him a call back.

## 2023-03-27 NOTE — Telephone Encounter (Signed)
 Tried to reach patient in regards to information on injection cost.  LVM stating that a message was sent to Paulina Fusi and she said she will send patient an estimate through [place]. Asked for a return call if needed.

## 2023-03-28 ENCOUNTER — Encounter (HOSPITAL_COMMUNITY): Payer: Self-pay

## 2023-03-28 ENCOUNTER — Telehealth: Payer: Self-pay | Admitting: Medical Oncology

## 2023-03-28 ENCOUNTER — Ambulatory Visit (HOSPITAL_COMMUNITY)
Admission: EM | Admit: 2023-03-28 | Discharge: 2023-03-28 | Disposition: A | Discharge status: Home / Self Care | Attending: Internal Medicine | Admitting: Internal Medicine

## 2023-03-28 DIAGNOSIS — R238 Other skin changes: Secondary | ICD-10-CM | POA: Diagnosis not present

## 2023-03-28 DIAGNOSIS — I8001 Phlebitis and thrombophlebitis of superficial vessels of right lower extremity: Secondary | ICD-10-CM

## 2023-03-28 DIAGNOSIS — M7989 Other specified soft tissue disorders: Secondary | ICD-10-CM

## 2023-03-28 NOTE — ED Provider Notes (Signed)
 [facility]    CSN: [id number] Arrival date & time: 03/28/23  1746      History   Chief Complaint Chief Complaint  Patient presents with   Leg Pain    HPI [name] is a 89 y.o. male.   89 year old male who presents urgent care with complaints of sudden onset of right medial thigh pain, swelling and redness.  He reports that he woke up this morning with the symptoms.  He denies any history of this.  He did have a blood transfusion a week ago and has had 2 iron transfusions this week.  He denies any new shortness of breath, chest pain, swelling below the knee that is new, fevers, chills.  He did try to contact his PCP and his hematologist but neither were able to see him today.  They recommended coming to urgent care.   Leg Pain   Past Medical History:  Diagnosis Date   Anemia    low hemoglobin   Arthritis    AV (angiodysplasia malformation of colon)    Benign prostatic hypertrophy    Cancer (HCC)    basal cell cancer   Chronic kidney disease    elevated Cr, BUN, Dr. Caryn Section manages   COPD (chronic obstructive pulmonary disease) (HCC)    Coronary artery disease    GERD (gastroesophageal reflux disease)    Gout    Heart murmur    History of kidney stones    Hyperlipidemia    Hypertension    Influenza    February 2019   Mitral regurgitation    Mitral valve prolapse    Pneumonia 1970's   PONV (postoperative nausea and vomiting)    1st knee replacement   Shortness of breath     Patient Active Problem List   Diagnosis Date Noted   Iron deficiency anemia 03/20/2023   Renal insufficiency    Lung nodule    GI bleed 06/14/2017   Displaced fracture of distal end of left radius 03/19/2017   Hyponatremia 02/19/2017   GIB (gastrointestinal bleeding) 06/30/2013   Postoperative anemia due to acute blood loss 02/18/2012   Osteoarthritis of left knee 02/15/2012   Hyperlipidemia    Normocytic anemia 05/01/2011   Dyspnea 04/25/2011   Mitral  regurgitation, severe 04/25/2011   CAD, single vessel RCA 04/25/2011   HTN (hypertension) 04/25/2011   Dyslipidemia 04/25/2011   Chronic renal insufficiency, stage [name] (moderate) (HCC) 04/25/2011   Gout 04/25/2011    Past Surgical History:  Procedure Laterality Date   ABDOMINAL ANGIOGRAM  04/30/2011   Procedure: ABDOMINAL ANGIOGRAM;  Surgeon: Runell Gess, MD;  Location: [facility] CATH LAB;  Service: Cardiovascular;;   CARDIAC CATHETERIZATION  04/2011   x2   CARDIAC CATHETERIZATION  08/2009   CATARACT EXTRACTION  2011   bilateral   COLONOSCOPY  05/04/2011   Procedure: COLONOSCOPY;  Surgeon: Theda Belfast, MD;  Location: [facility] ENDOSCOPY;  Service: Endoscopy;  Laterality: N/A;   COLONOSCOPY N/A 07/16/2013   Procedure: COLONOSCOPY;  Surgeon: Theda Belfast, MD;  Location: [facility] ENDOSCOPY;  Service: Endoscopy;  Laterality: N/A;   COLONOSCOPY WITH PROPOFOL N/A 06/20/2017   Procedure: COLONOSCOPY WITH PROPOFOL;  Surgeon: Jeani Hawking, MD;  Location: [facility] ENDOSCOPY;  Service: Endoscopy;  Laterality: N/A;   CORONARY ANGIOGRAM  04/30/2011   Procedure: CORONARY ANGIOGRAM;  Surgeon: Runell Gess, MD;  Location: [facility] CATH LAB;  Service: Cardiovascular;;   ESOPHAGOGASTRODUODENOSCOPY  05/04/2011   Procedure: ESOPHAGOGASTRODUODENOSCOPY (EGD);  Surgeon: Theda Belfast, MD;  Location: [facility] ENDOSCOPY;  Service: Endoscopy;  Laterality: N/A;   EYE SURGERY     bilateral cataracts removed   HERNIA REPAIR  1999&2000   bilateral   LOBECTOMY  1980   left lower lobectomy for benign tumor   OPEN REDUCTION INTERNAL FIXATION (ORIF) DISTAL RADIAL FRACTURE Left 03/19/2017   Procedure: OPEN REDUCTION INTERNAL FIXATION (ORIF) DISTAL RADIAL FRACTURE;  Surgeon: Jodi Geralds, MD;  Location: [facility] OR;  Service: Orthopedics;  Laterality: Left;  AXILLARY BLOCK   POLYPECTOMY  06/20/2017   Procedure: POLYPECTOMY;  Surgeon: Jeani Hawking, MD;  Location: [facility] ENDOSCOPY;  Service: Endoscopy;;   REPLACEMENT TOTAL KNEE  2005   right   RIGHT HEART  CATHETERIZATION  04/30/2011   Procedure: RIGHT HEART CATH;  Surgeon: Runell Gess, MD;  Location: [facility] CATH LAB;  Service: Cardiovascular;;   TEE WITHOUT CARDIOVERSION  04/30/2011   Procedure: TRANSESOPHAGEAL ECHOCARDIOGRAM (TEE);  Surgeon: Thurmon Fair, MD;  Location: [facility] ENDOSCOPY;  Service: Cardiovascular;  Laterality: N/A;  3D TEE/ patient will be admitted the day before test , therefo patient will be a inpatient the day of TEE   TOTAL KNEE ARTHROPLASTY Left 02/15/2012   Procedure: TOTAL KNEE ARTHROPLASTY;  Surgeon: Harvie Junior, MD;  Location: [facility] OR;  Service: Orthopedics;  Laterality: Left;       Home Medications    Prior to Admission medications   Medication Sig Start Date End Date Taking? Authorizing Provider  acetaminophen (TYLENOL) 325 MG tablet Take 650 mg by mouth every 6 (six) hours as needed for mild pain or headache.    [provider]  allopurinol (ZYLOPRIM) 300 MG tablet Take 150 mg by mouth daily.     [provider]  aspirin EC 81 MG tablet Take 81 mg by mouth 2 (two) times a week. Swallow whole.    [provider]  atorvastatin (LIPITOR) 10 MG tablet TAKE 1 TABLET (10 MG TOTAL) BY MOUTH DAILY AT 6 PM. 05/17/22   Runell Gess, MD  diltiazem (CARDIZEM CD) 120 MG 24 hr capsule Take 1 capsule (120 mg total) by mouth daily. Please call [phone] to schedule a  July appointment for future refills. Thank you. 02/12/23 05/13/23  Runell Gess, MD  lisinopril (ZESTRIL) 20 MG tablet Take 20 mg by mouth daily. 12/01/19   [provider]  omeprazole (PRILOSEC OTC) 20 MG tablet Take 20 mg by mouth every other day.    [provider]  polyethylene glycol (MIRALAX / GLYCOLAX) packet Take 17 g by mouth daily as needed for moderate constipation.     [provider]    Family History Family History  Problem Relation Age of Onset   Hypertension Mother    Osteoporosis Mother    Lung cancer Father    Irregular heart beat  Brother    Heart attack Maternal Grandfather    Cancer Paternal Grandfather     Social History Social History   Tobacco Use   Smoking status: Former    Current packs/day: 0.00    Types: Cigarettes    Start date: 02/08/1941    Quit date: 02/08/1961    Years since quitting: 62.1   Smokeless tobacco: Former    Quit date: 02/08/1961  Vaping Use   Vaping status: Never Used  Substance Use Topics   Alcohol use: Yes    Alcohol/week: 7.0 standard drinks of alcohol    Types: 7 Standard drinks or equivalent per week    Comment: 2ozs Financial controller daily  Drug use: No     Allergies   Patient has no known allergies.   Review of Systems Review of Systems   Physical Exam Triage Vital Signs ED Triage Vitals [03/28/23 1933]  Encounter Vitals Group     BP (!) 166/61     Systolic BP Percentile      Diastolic BP Percentile      Pulse Rate 69     Resp (!) 26     Temp 97.9 F (36.6 C)     Temp src      SpO2 96 %     Weight      Height      Head Circumference      Peak Flow      Pain Score      Pain Loc      Pain Education      Exclude from Growth Chart    No data found.  Updated Vital Signs BP (!) 166/61 (BP Location: Left Arm)   Pulse 69   Temp 97.9 F (36.6 C)   Resp (!) 26   SpO2 96%   Visual Acuity Right Eye Distance:   Left Eye Distance:   Bilateral Distance:    Right Eye Near:   Left Eye Near:    Bilateral Near:     Physical Exam Vitals and nursing note reviewed.  Constitutional:      General: He is not in acute distress.    Appearance: He is well-developed.  HENT:     Head: Normocephalic and atraumatic.     Nose: No congestion.  Eyes:     Conjunctiva/sclera: Conjunctivae normal.  Cardiovascular:     Rate and Rhythm: Normal rate and regular rhythm.     Heart sounds: Murmur heard.  Pulmonary:     Effort: Pulmonary effort is normal. No respiratory distress.     Breath sounds: Normal breath sounds.  Abdominal:     Palpations: Abdomen is soft.      Tenderness: There is no abdominal tenderness.  Musculoskeletal:        General: No swelling.     Cervical back: Neck supple.  Skin:    General: Skin is warm and dry.     Capillary Refill: Capillary refill takes less than 2 seconds.     Comments: Right medial thigh with redness and swelling that extends from just below the groin to just above the knee.  This is isolated to the medial thigh.  This does not extend lateral or posterior.  The area is tender to palpitation  Neurological:     Mental Status: He is alert.  Psychiatric:        Mood and Affect: Mood normal.      UC Treatments / Results  Labs (all labs ordered are listed, but only abnormal results are displayed) Labs Reviewed - No data to display  EKG   Radiology No results found.  Procedures Procedures (including critical care time)  Medications Ordered in UC Medications - No data to display  Initial Impression / Assessment and Plan / UC Course  I have reviewed the triage vital signs and the nursing notes.  Pertinent labs & imaging results that were available during my care of the patient were reviewed by me and considered in my medical decision making (see chart for details).     Thrombophlebitis of superficial veins of right lower extremity  Redness and swelling of thigh    Symptoms are most consistent with right lower extremity superficial  thrombophlebitis.  This is normally a self-limiting condition however we still will get an ultrasound to verify that this is the correct diagnosis.  An ultrasound has been ordered and the instructions are attached.  You will go for this ultrasound tomorrow at 11 AM.  Pending these results further treatment will be recommended otherwise elevate the leg and may use warm compresses to help with symptoms.  Go to the emergency room immediately if you develop chest pain, worsening shortness of breath or significant increase in swelling and pain in the leg. Final Clinical  Impressions(s) / UC Diagnoses   Final diagnoses:  Thrombophlebitis of superficial veins of right lower extremity  Redness and swelling of thigh   Discharge Instructions   None    ED Prescriptions   None    PDMP not reviewed this encounter.   Landis Martins, [place] 03/28/23 2014

## 2023-03-28 NOTE — Telephone Encounter (Signed)
 New-Right leg swelling from the knee up that is very sore to touch and hard to walk. When he walks he feels discomfort in thigh area.He noticed it when he woke up. He takes aspirin 81 mg twice a week.  He called PCP and he is out of office . Pt was directed to call cancer center.

## 2023-03-28 NOTE — Discharge Instructions (Addendum)
 Symptoms are most consistent with right lower extremity superficial thrombophlebitis.  This is normally a self-limiting condition however we still will get an ultrasound to verify that this is the correct diagnosis.  An ultrasound has been ordered and the instructions are attached.  You will go for this ultrasound tomorrow at 11 AM.  Pending these results further treatment will be recommended otherwise elevate the leg and may use warm compresses to help with symptoms.  Go to the emergency room immediately if you develop chest pain, worsening shortness of breath or significant increase in swelling and pain in the leg.

## 2023-03-28 NOTE — ED Triage Notes (Signed)
 Pt states had an iron infusion on Monday and yesterday. States woke up this morning with rt upper leg red, painful, and warm to touch.

## 2023-03-28 NOTE — Telephone Encounter (Signed)
 I instructed pt to go to an Urgent Care.

## 2023-03-29 ENCOUNTER — Ambulatory Visit (HOSPITAL_COMMUNITY)
Admission: RE | Admit: 2023-03-29 | Discharge: 2023-03-29 | Disposition: A | Discharge status: Home / Self Care | Source: Ambulatory Visit | Attending: Family Medicine | Admitting: Family Medicine

## 2023-03-29 ENCOUNTER — Ambulatory Visit

## 2023-03-29 VITALS — BP 131/63 | HR 66 | Temp 97.7°F | Resp 22 | Ht 72.0 in | Wt 175.8 lb

## 2023-03-29 DIAGNOSIS — M7989 Other specified soft tissue disorders: Secondary | ICD-10-CM | POA: Diagnosis not present

## 2023-03-29 DIAGNOSIS — D509 Iron deficiency anemia, unspecified: Secondary | ICD-10-CM

## 2023-03-29 DIAGNOSIS — R600 Localized edema: Secondary | ICD-10-CM | POA: Diagnosis not present

## 2023-03-29 DIAGNOSIS — M79661 Pain in right lower leg: Secondary | ICD-10-CM | POA: Insufficient documentation

## 2023-03-29 MED ORDER — ACETAMINOPHEN 325 MG PO TABS
650.0000 mg | ORAL_TABLET | Freq: Once | ORAL | Status: AC
  Administered 2023-03-29: 650 mg via ORAL
  Filled 2023-03-29: qty 2

## 2023-03-29 MED ORDER — LORATADINE 10 MG PO TABS
10.0000 mg | ORAL_TABLET | Freq: Every day | ORAL | Status: DC
  Administered 2023-03-29: 10 mg via ORAL
  Filled 2023-03-29: qty 1

## 2023-03-29 MED ORDER — IRON SUCROSE 20 MG/ML IV SOLN
200.0000 mg | Freq: Once | INTRAVENOUS | Status: AC
  Administered 2023-03-29: 200 mg via INTRAVENOUS
  Filled 2023-03-29: qty 10

## 2023-03-29 NOTE — Progress Notes (Signed)
 Diagnosis: Iron Deficiency Anemia  Provider:  Chilton Greathouse MD  Procedure: IV Push  IV Type: Peripheral, IV Location: L Forearm  Venofer (Iron Sucrose), Dose: 200 mg  Post Infusion IV Care: Patient declined observation and Peripheral IV Discontinued  Discharge: Condition: Good, Destination: Home . AVS Declined  Performed by:  Rico Ala, LPN

## 2023-04-01 ENCOUNTER — Other Ambulatory Visit: Payer: Self-pay

## 2023-04-01 ENCOUNTER — Telehealth: Payer: Self-pay | Admitting: Medical Oncology

## 2023-04-01 ENCOUNTER — Emergency Department (HOSPITAL_COMMUNITY)
Admission: EM | Admit: 2023-04-01 | Discharge: 2023-04-01 | Disposition: A | Discharge status: Home / Self Care | Attending: Emergency Medicine | Admitting: Emergency Medicine

## 2023-04-01 ENCOUNTER — Encounter (HOSPITAL_COMMUNITY): Payer: Self-pay

## 2023-04-01 ENCOUNTER — Ambulatory Visit

## 2023-04-01 DIAGNOSIS — L03115 Cellulitis of right lower limb: Secondary | ICD-10-CM | POA: Insufficient documentation

## 2023-04-01 DIAGNOSIS — I129 Hypertensive chronic kidney disease with stage 1 through stage 4 chronic kidney disease, or unspecified chronic kidney disease: Secondary | ICD-10-CM | POA: Insufficient documentation

## 2023-04-01 DIAGNOSIS — Z79899 Other long term (current) drug therapy: Secondary | ICD-10-CM | POA: Insufficient documentation

## 2023-04-01 DIAGNOSIS — Z7982 Long term (current) use of aspirin: Secondary | ICD-10-CM | POA: Insufficient documentation

## 2023-04-01 DIAGNOSIS — M7989 Other specified soft tissue disorders: Secondary | ICD-10-CM

## 2023-04-01 DIAGNOSIS — J449 Chronic obstructive pulmonary disease, unspecified: Secondary | ICD-10-CM | POA: Insufficient documentation

## 2023-04-01 DIAGNOSIS — N189 Chronic kidney disease, unspecified: Secondary | ICD-10-CM | POA: Insufficient documentation

## 2023-04-01 DIAGNOSIS — I2510 Atherosclerotic heart disease of native coronary artery without angina pectoris: Secondary | ICD-10-CM | POA: Insufficient documentation

## 2023-04-01 MED ORDER — LORATADINE 10 MG PO TABS: 10.0000 mg | ORAL_TABLET | Freq: Every day | ORAL | Status: DC

## 2023-04-01 MED ORDER — CEPHALEXIN 500 MG PO CAPS
500.0000 mg | ORAL_CAPSULE | Freq: Four times a day (QID) | ORAL | 0 refills | Status: DC
Start: 2023-04-01 — End: 2023-04-08

## 2023-04-01 MED ORDER — IRON SUCROSE 20 MG/ML IV SOLN: 200.0000 mg | Freq: Once | INTRAVENOUS | Status: DC

## 2023-04-01 MED ORDER — ACETAMINOPHEN 325 MG PO TABS: 650.0000 mg | ORAL_TABLET | Freq: Once | ORAL | Status: DC

## 2023-04-01 NOTE — Discharge Instructions (Signed)
 Your ultrasound on Friday showed the following: "Summary:  RIGHT:  - There is no evidence of deep vein thrombosis in the lower extremity.   - No cystic structure found in the popliteal fossa.  - Ultrasound characteristics of an enlarged lymph node is noted in the  groin.  extensive subcutaneous edema of lower extremity   LEFT:  - No evidence of common femoral vein obstruction."  A prescription for an antibiotic for treatment of cellulitis was sent to your pharmacy.  Take as prescribed for the next 5 days.  Elevate your right leg when possible.  Follow-up with your primary care doctor this week.  Return to the emergency department for any new or worsening symptoms of concern.

## 2023-04-01 NOTE — Telephone Encounter (Signed)
 Call from Dr. Barrie Dunker' nurse -she will f/u with provider and call pt.

## 2023-04-01 NOTE — Telephone Encounter (Signed)
 I left a message for PCP to return my call.

## 2023-04-01 NOTE — ED Provider Notes (Signed)
 [place] EMERGENCY DEPARTMENT AT [facility] Provider Note   CSN: [id number] Arrival date & time: 04/01/23  1112     History  Chief Complaint  Patient presents with   Leg Swelling    [name] is a 89 y.o. male.  HPI Patient presents for right leg swelling.  Medical history includes HTN, HLD, gout, arthritis, prior GI bleed, anemia, CAD, COPD, CKD, mitral regurgitation.  4 days ago, he developed pain and swelling to medial aspect of right thigh.  This pain was worsened with weightbearing and ambulation.  He was unable to get seen by his PCP.  He went to urgent care on Thursday.  They scheduled him for an outpatient DVT study.  He underwent right DVT study 3 days ago.  Study was negative for DVT.  He does have an enlarged lymph node in area of right groin and subcutaneous edema of lower extremity.  He has been in contact with his PCP.  His pain is migrated to the lateral aspect of his right thigh.  PCP was unable to see results of ultrasound on Friday.  PCP advised him to come to the ED.  Currently, at rest, patient is asymptomatic.  He states that pain typically occurs when standing.  He feels like the pain and swelling to the medial aspect of his thigh has improved.    Home Medications Prior to Admission medications   Medication Sig Start Date End Date Taking? Authorizing Provider  cephALEXin (KEFLEX) 500 MG capsule Take 1 capsule (500 mg total) by mouth 4 (four) times daily for 5 days. 04/01/23 04/06/23 Yes Gloris Manchester, MD  acetaminophen (TYLENOL) 325 MG tablet Take 650 mg by mouth every 6 (six) hours as needed for mild pain or headache.    [provider]  allopurinol (ZYLOPRIM) 300 MG tablet Take 150 mg by mouth daily.     [provider]  aspirin EC 81 MG tablet Take 81 mg by mouth 2 (two) times a week. Swallow whole.    [provider]  atorvastatin (LIPITOR) 10 MG tablet TAKE 1 TABLET (10 MG TOTAL) BY MOUTH DAILY AT 6 PM. 05/17/22    Runell Gess, MD  diltiazem (CARDIZEM CD) 120 MG 24 hr capsule Take 1 capsule (120 mg total) by mouth daily. Please call [phone] to schedule a  July appointment for future refills. Thank you. 02/12/23 05/13/23  Runell Gess, MD  lisinopril (ZESTRIL) 20 MG tablet Take 20 mg by mouth daily. 12/01/19   [provider]  omeprazole (PRILOSEC OTC) 20 MG tablet Take 20 mg by mouth every other day.    [provider]  polyethylene glycol (MIRALAX / GLYCOLAX) packet Take 17 g by mouth daily as needed for moderate constipation.     [provider]      Allergies    Patient has no known allergies.    Review of Systems   Review of Systems  Cardiovascular:  Positive for leg swelling.  All other systems reviewed and are negative.   Physical Exam Updated Vital Signs BP (!) 125/52   Pulse (!) 52   Temp 97.7 F (36.5 C) (Oral)   Resp 18   Ht 6' (1.829 m)   Wt 80 kg   SpO2 93%   BMI 23.92 kg/m  Physical Exam Vitals and nursing note reviewed.  Constitutional:      General: He is not in acute distress.    Appearance: Normal appearance. He is well-developed. He  is not ill-appearing, toxic-appearing or diaphoretic.  HENT:     Head: Normocephalic and atraumatic.     Right Ear: External ear normal.     Left Ear: External ear normal.     Nose: Nose normal.     Mouth/Throat:     Mouth: Mucous membranes are moist.  Eyes:     Extraocular Movements: Extraocular movements intact.     Conjunctiva/sclera: Conjunctivae normal.  Cardiovascular:     Rate and Rhythm: Normal rate and regular rhythm.  Pulmonary:     Effort: Pulmonary effort is normal. No respiratory distress.  Abdominal:     General: There is no distension.     Palpations: Abdomen is soft.     Tenderness: There is no abdominal tenderness.  Musculoskeletal:        General: Swelling and tenderness present.     Cervical back: Normal range of motion and neck supple.  Skin:    General: Skin is warm  and dry.     Findings: Erythema present.  Neurological:     General: No focal deficit present.     Mental Status: He is alert and oriented to person, place, and time.  Psychiatric:        Mood and Affect: Mood normal.        Behavior: Behavior normal.     ED Results / Procedures / Treatments   Labs (all labs ordered are listed, but only abnormal results are displayed) Labs Reviewed - No data to display  EKG None  Radiology No results found.  Procedures Procedures    Medications Ordered in ED Medications - No data to display  ED Course/ Medical Decision Making/ A&P                                 Medical Decision Making Risk Prescription drug management.   Patient presenting for right leg swelling and pain.  Onset was 4 to 5 days ago.  He underwent negative DVT ultrasound study 3 days ago.  Per chart review, his oncology office was told that the ultrasound was not done.  For this reason, he was sent to the ED.  On review of chart and in speaking with patient, he did undergo ultrasound study on Friday.  Results are as follows: "Summary:  RIGHT:  - There is no evidence of deep vein thrombosis in the lower extremity.   - No cystic structure found in the popliteal fossa.  - Ultrasound characteristics of an enlarged lymph node is noted in the  groin.  extensive subcutaneous edema of lower extremity   LEFT:  - No evidence of common femoral vein obstruction."  On exam, patient is overall well-appearing.  On the medial aspect of his right thigh, he does have some erythema, swelling, and induration.  Area is mildly tender and mildly warm.  Enlarged lymph node identified on ultrasound likely reactive from this lower extremity inflammation.  Given exam findings, patient to be treated for cellulitis.  Keflex was prescribed.  Patient will reach out to his PCP for reassessment this week.  He was discharged in stable condition.        Final Clinical Impression(s) / ED  Diagnoses Final diagnoses:  Leg swelling  Cellulitis of right lower extremity    Rx / DC Orders ED Discharge Orders          Ordered    cephALEXin (KEFLEX) 500 MG capsule  4 times  daily        04/01/23 1142              Gloris Manchester, MD 04/01/23 1149

## 2023-04-01 NOTE — Telephone Encounter (Signed)
 Persistent pain in  right upper extremity - thigh area . The pain started on his inner thigh a few days ago and now it has moved to outer thigh.He his still having trouble walking with a lot of pain in his right upper leg when he gets up and when he walks.   Pt seen in urgent care and he stated his [place] was "negative for a blood clot". I called for US venous report-" it was not done". There is an order for a lower extremity US .  He sounded a little SOB but stated " my breathing is fine".   I told him to f/u with Dr Barrie Dunker.

## 2023-04-01 NOTE — ED Triage Notes (Signed)
 Patient stated he needs an ultrasound of his right leg. Complaining of pain that moved from right inner thigh to right outer thigh. Leg is red and swollen.

## 2023-04-02 DIAGNOSIS — R600 Localized edema: Secondary | ICD-10-CM | POA: Diagnosis not present

## 2023-04-02 DIAGNOSIS — L03115 Cellulitis of right lower limb: Secondary | ICD-10-CM | POA: Diagnosis not present

## 2023-04-02 DIAGNOSIS — M25561 Pain in right knee: Secondary | ICD-10-CM | POA: Diagnosis not present

## 2023-04-03 ENCOUNTER — Ambulatory Visit

## 2023-04-03 VITALS — BP 114/56 | HR 48 | Temp 96.8°F | Resp 16 | Ht 72.0 in | Wt 176.9 lb

## 2023-04-03 DIAGNOSIS — D509 Iron deficiency anemia, unspecified: Secondary | ICD-10-CM | POA: Diagnosis not present

## 2023-04-03 MED ORDER — LORATADINE 10 MG PO TABS
10.0000 mg | ORAL_TABLET | Freq: Every day | ORAL | Status: DC
  Administered 2023-04-03: 10 mg via ORAL
  Filled 2023-04-03: qty 1

## 2023-04-03 MED ORDER — ACETAMINOPHEN 325 MG PO TABS
650.0000 mg | ORAL_TABLET | Freq: Once | ORAL | Status: AC
  Administered 2023-04-03: 650 mg via ORAL
  Filled 2023-04-03: qty 2

## 2023-04-03 MED ORDER — IRON SUCROSE 20 MG/ML IV SOLN
200.0000 mg | Freq: Once | INTRAVENOUS | Status: AC
  Administered 2023-04-03: 200 mg via INTRAVENOUS
  Filled 2023-04-03: qty 10

## 2023-04-03 NOTE — Progress Notes (Signed)
 Diagnosis: Iron Deficiency Anemia  Provider:  Chilton Greathouse MD  Procedure: IV Push  IV Type: Peripheral, IV Location: L Forearm  Venofer (Iron Sucrose), Dose: 200 mg  Post Infusion IV Care: Observation period completed and Peripheral IV Discontinued  Discharge: Condition: Good, Destination: Home . AVS Declined  Performed by:  Forrest Moron, RN

## 2023-04-04 ENCOUNTER — Inpatient Hospital Stay (HOSPITAL_COMMUNITY)
Admission: EM | Admit: 2023-04-04 | Discharge: 2023-04-08 | DRG: 602 | Disposition: A | Discharge status: Home / Self Care | Attending: Internal Medicine | Admitting: Internal Medicine

## 2023-04-04 ENCOUNTER — Emergency Department (HOSPITAL_COMMUNITY)

## 2023-04-04 ENCOUNTER — Other Ambulatory Visit: Payer: Self-pay

## 2023-04-04 ENCOUNTER — Inpatient Hospital Stay (HOSPITAL_COMMUNITY)

## 2023-04-04 ENCOUNTER — Encounter (HOSPITAL_COMMUNITY): Payer: Self-pay

## 2023-04-04 DIAGNOSIS — J449 Chronic obstructive pulmonary disease, unspecified: Secondary | ICD-10-CM | POA: Diagnosis present

## 2023-04-04 DIAGNOSIS — Z789 Other specified health status: Secondary | ICD-10-CM

## 2023-04-04 DIAGNOSIS — I878 Other specified disorders of veins: Secondary | ICD-10-CM | POA: Diagnosis not present

## 2023-04-04 DIAGNOSIS — B9689 Other specified bacterial agents as the cause of diseases classified elsewhere: Secondary | ICD-10-CM | POA: Diagnosis not present

## 2023-04-04 DIAGNOSIS — D631 Anemia in chronic kidney disease: Secondary | ICD-10-CM | POA: Diagnosis present

## 2023-04-04 DIAGNOSIS — N1832 Chronic kidney disease, stage 3b: Secondary | ICD-10-CM | POA: Diagnosis present

## 2023-04-04 DIAGNOSIS — Z96651 Presence of right artificial knee joint: Secondary | ICD-10-CM | POA: Diagnosis not present

## 2023-04-04 DIAGNOSIS — I129 Hypertensive chronic kidney disease with stage 1 through stage 4 chronic kidney disease, or unspecified chronic kidney disease: Secondary | ICD-10-CM | POA: Diagnosis not present

## 2023-04-04 DIAGNOSIS — Z8701 Personal history of pneumonia (recurrent): Secondary | ICD-10-CM

## 2023-04-04 DIAGNOSIS — Z66 Do not resuscitate: Secondary | ICD-10-CM | POA: Diagnosis not present

## 2023-04-04 DIAGNOSIS — Z8249 Family history of ischemic heart disease and other diseases of the circulatory system: Secondary | ICD-10-CM

## 2023-04-04 DIAGNOSIS — I2510 Atherosclerotic heart disease of native coronary artery without angina pectoris: Secondary | ICD-10-CM | POA: Diagnosis present

## 2023-04-04 DIAGNOSIS — M109 Gout, unspecified: Secondary | ICD-10-CM | POA: Diagnosis present

## 2023-04-04 DIAGNOSIS — K219 Gastro-esophageal reflux disease without esophagitis: Secondary | ICD-10-CM | POA: Diagnosis not present

## 2023-04-04 DIAGNOSIS — Z85828 Personal history of other malignant neoplasm of skin: Secondary | ICD-10-CM | POA: Diagnosis not present

## 2023-04-04 DIAGNOSIS — M726 Necrotizing fasciitis: Secondary | ICD-10-CM | POA: Diagnosis not present

## 2023-04-04 DIAGNOSIS — E860 Dehydration: Secondary | ICD-10-CM | POA: Diagnosis not present

## 2023-04-04 DIAGNOSIS — Z87891 Personal history of nicotine dependence: Secondary | ICD-10-CM | POA: Diagnosis not present

## 2023-04-04 DIAGNOSIS — Z1152 Encounter for screening for COVID-19: Secondary | ICD-10-CM

## 2023-04-04 DIAGNOSIS — Z96652 Presence of left artificial knee joint: Secondary | ICD-10-CM | POA: Diagnosis present

## 2023-04-04 DIAGNOSIS — L03115 Cellulitis of right lower limb: Principal | ICD-10-CM | POA: Diagnosis present

## 2023-04-04 DIAGNOSIS — Z8262 Family history of osteoporosis: Secondary | ICD-10-CM

## 2023-04-04 DIAGNOSIS — L89153 Pressure ulcer of sacral region, stage 3: Secondary | ICD-10-CM | POA: Diagnosis not present

## 2023-04-04 DIAGNOSIS — R680 Hypothermia, not associated with low environmental temperature: Secondary | ICD-10-CM | POA: Diagnosis present

## 2023-04-04 DIAGNOSIS — L03119 Cellulitis of unspecified part of limb: Principal | ICD-10-CM | POA: Diagnosis present

## 2023-04-04 DIAGNOSIS — M7989 Other specified soft tissue disorders: Secondary | ICD-10-CM | POA: Diagnosis present

## 2023-04-04 DIAGNOSIS — I959 Hypotension, unspecified: Secondary | ICD-10-CM | POA: Diagnosis present

## 2023-04-04 DIAGNOSIS — Z79899 Other long term (current) drug therapy: Secondary | ICD-10-CM | POA: Diagnosis not present

## 2023-04-04 DIAGNOSIS — R600 Localized edema: Secondary | ICD-10-CM | POA: Diagnosis not present

## 2023-04-04 DIAGNOSIS — E785 Hyperlipidemia, unspecified: Secondary | ICD-10-CM | POA: Diagnosis not present

## 2023-04-04 DIAGNOSIS — R5381 Other malaise: Secondary | ICD-10-CM | POA: Diagnosis present

## 2023-04-04 DIAGNOSIS — Z87442 Personal history of urinary calculi: Secondary | ICD-10-CM

## 2023-04-04 DIAGNOSIS — Z801 Family history of malignant neoplasm of trachea, bronchus and lung: Secondary | ICD-10-CM

## 2023-04-04 DIAGNOSIS — I517 Cardiomegaly: Secondary | ICD-10-CM | POA: Diagnosis not present

## 2023-04-04 DIAGNOSIS — I10 Essential (primary) hypertension: Secondary | ICD-10-CM | POA: Diagnosis not present

## 2023-04-04 DIAGNOSIS — N400 Enlarged prostate without lower urinary tract symptoms: Secondary | ICD-10-CM | POA: Diagnosis present

## 2023-04-04 DIAGNOSIS — M791 Myalgia, unspecified site: Secondary | ICD-10-CM | POA: Diagnosis not present

## 2023-04-04 LAB — CBC WITH DIFFERENTIAL/PLATELET
Abs Immature Granulocytes: 0.14 10*3/uL — ABNORMAL HIGH (ref 0.00–0.07)
Basophils Absolute: 0 10*3/uL (ref 0.0–0.1)
Basophils Relative: 0 %
Eosinophils Absolute: 0.1 10*3/uL (ref 0.0–0.5)
Eosinophils Relative: 1 %
HCT: 25.5 % — ABNORMAL LOW (ref 39.0–52.0)
Hemoglobin: 8 g/dL — ABNORMAL LOW (ref 13.0–17.0)
Immature Granulocytes: 2 %
Lymphocytes Relative: 5 %
Lymphs Abs: 0.5 10*3/uL — ABNORMAL LOW (ref 0.7–4.0)
MCH: 30.1 pg (ref 26.0–34.0)
MCHC: 31.4 g/dL (ref 30.0–36.0)
MCV: 95.9 fL (ref 80.0–100.0)
Monocytes Absolute: 0.5 10*3/uL (ref 0.1–1.0)
Monocytes Relative: 5 %
Neutro Abs: 7.9 10*3/uL — ABNORMAL HIGH (ref 1.7–7.7)
Neutrophils Relative %: 87 %
Platelets: 147 10*3/uL — ABNORMAL LOW (ref 150–400)
RBC: 2.66 MIL/uL — ABNORMAL LOW (ref 4.22–5.81)
RDW: 18.3 % — ABNORMAL HIGH (ref 11.5–15.5)
WBC: 9.1 10*3/uL (ref 4.0–10.5)
nRBC: 0.4 % — ABNORMAL HIGH (ref 0.0–0.2)

## 2023-04-04 LAB — URINALYSIS, W/ REFLEX TO CULTURE (INFECTION SUSPECTED)
Bacteria, UA: NONE SEEN
Bilirubin Urine: NEGATIVE
Glucose, UA: NEGATIVE mg/dL
Hgb urine dipstick: NEGATIVE
Ketones, ur: NEGATIVE mg/dL
Nitrite: NEGATIVE
Protein, ur: 30 mg/dL — AB
Specific Gravity, Urine: 1.016 (ref 1.005–1.030)
pH: 5 (ref 5.0–8.0)

## 2023-04-04 LAB — I-STAT CG4 LACTIC ACID, ED
Lactic Acid, Venous: 1.1 mmol/L (ref 0.5–1.9)
Lactic Acid, Venous: 0.7 mmol/L (ref 0.5–1.9)

## 2023-04-04 LAB — C-REACTIVE PROTEIN: CRP: 13.3 mg/dL — ABNORMAL HIGH (ref <1.0)

## 2023-04-04 LAB — PROTIME-INR
INR: 1.1 (ref 0.8–1.2)
Prothrombin Time: 14.2 s (ref 11.4–15.2)

## 2023-04-04 LAB — COMPREHENSIVE METABOLIC PANEL WITH GFR
ALT: 23 U/L (ref 0–44)
AST: 33 U/L (ref 15–41)
Albumin: 2.9 g/dL — ABNORMAL LOW (ref 3.5–5.0)
Alkaline Phosphatase: 85 U/L (ref 38–126)
Anion gap: 9 (ref 5–15)
BUN: 79 mg/dL — ABNORMAL HIGH (ref 8–23)
CO2: 19 mmol/L — ABNORMAL LOW (ref 22–32)
Calcium: 8.9 mg/dL (ref 8.9–10.3)
Chloride: 110 mmol/L (ref 98–111)
Creatinine, Ser: 2.09 mg/dL — ABNORMAL HIGH (ref 0.61–1.24)
GFR, Estimated: 29 mL/min — ABNORMAL LOW (ref >60)
Glucose, Bld: 90 mg/dL (ref 70–99)
Potassium: 4.5 mmol/L (ref 3.5–5.1)
Sodium: 138 mmol/L (ref 135–145)
Total Bilirubin: 0.4 mg/dL (ref 0.0–1.2)
Total Protein: 6.5 g/dL (ref 6.5–8.1)

## 2023-04-04 LAB — SEDIMENTATION RATE: Sed Rate: 70 mm/h — ABNORMAL HIGH (ref 0–16)

## 2023-04-04 LAB — RESP PANEL BY RT-PCR (RSV, FLU A&B, COVID)  RVPGX2
Influenza A by PCR: NEGATIVE
Influenza B by PCR: NEGATIVE
Resp Syncytial Virus by PCR: NEGATIVE
SARS Coronavirus 2 by RT PCR: NEGATIVE

## 2023-04-04 LAB — CK: Total CK: 49 U/L (ref 49–397)

## 2023-04-04 MED ORDER — ONDANSETRON HCL 4 MG PO TABS: 4.0000 mg | ORAL_TABLET | Freq: Four times a day (QID) | ORAL | Status: DC | PRN

## 2023-04-04 MED ORDER — ACETAMINOPHEN 325 MG PO TABS: 650.0000 mg | ORAL_TABLET | Freq: Four times a day (QID) | ORAL | Status: DC | PRN

## 2023-04-04 MED ORDER — PIPERACILLIN-TAZOBACTAM 3.375 G IVPB 30 MIN
3.3750 g | Freq: Once | INTRAVENOUS | Status: AC
  Administered 2023-04-04: 3.375 g via INTRAVENOUS
  Filled 2023-04-04: qty 50

## 2023-04-04 MED ORDER — TRAZODONE HCL 50 MG PO TABS: 25.0000 mg | ORAL_TABLET | Freq: Every evening | ORAL | Status: DC | PRN

## 2023-04-04 MED ORDER — LACTATED RINGERS IV SOLN: INTRAVENOUS | Status: AC

## 2023-04-04 MED ORDER — LACTATED RINGERS IV BOLUS (SEPSIS)
250.0000 mL | Freq: Once | INTRAVENOUS | Status: AC
  Administered 2023-04-04: 250 mL via INTRAVENOUS

## 2023-04-04 MED ORDER — ATORVASTATIN CALCIUM 10 MG PO TABS
10.0000 mg | ORAL_TABLET | Freq: Every day | ORAL | Status: DC
  Administered 2023-04-05 – 2023-04-07 (×3): 10 mg via ORAL
  Filled 2023-04-04 (×3): qty 1

## 2023-04-04 MED ORDER — PIPERACILLIN-TAZOBACTAM 3.375 G IVPB
3.3750 g | Freq: Three times a day (TID) | INTRAVENOUS | Status: DC
  Administered 2023-04-04 – 2023-04-05 (×2): 3.375 g via INTRAVENOUS
  Filled 2023-04-04 (×2): qty 50

## 2023-04-04 MED ORDER — POLYETHYLENE GLYCOL 3350 17 G PO PACK: 17.0000 g | PACK | Freq: Every day | ORAL | Status: DC | PRN

## 2023-04-04 MED ORDER — ONDANSETRON HCL 4 MG/2ML IJ SOLN
4.0000 mg | Freq: Four times a day (QID) | INTRAMUSCULAR | Status: DC | PRN
Start: 2023-04-04 — End: 2023-04-08

## 2023-04-04 MED ORDER — LINEZOLID 600 MG/300ML IV SOLN
600.0000 mg | Freq: Two times a day (BID) | INTRAVENOUS | Status: DC
  Administered 2023-04-05: 600 mg via INTRAVENOUS
  Filled 2023-04-04 (×3): qty 300

## 2023-04-04 MED ORDER — LINEZOLID 600 MG/300ML IV SOLN
600.0000 mg | Freq: Once | INTRAVENOUS | Status: AC
  Administered 2023-04-04: 600 mg via INTRAVENOUS
  Filled 2023-04-04: qty 300

## 2023-04-04 MED ORDER — HYDROMORPHONE HCL 1 MG/ML IJ SOLN: 0.5000 mg | INTRAMUSCULAR | Status: DC | PRN

## 2023-04-04 MED ORDER — OXYCODONE HCL 5 MG PO TABS: 5.0000 mg | ORAL_TABLET | ORAL | Status: DC | PRN

## 2023-04-04 MED ORDER — ALBUTEROL SULFATE (2.5 MG/3ML) 0.083% IN NEBU: 2.5000 mg | INHALATION_SOLUTION | RESPIRATORY_TRACT | Status: DC | PRN

## 2023-04-04 NOTE — Progress Notes (Signed)
 Pharmacy Antibiotic Note  [name] is a 89 y.o. male admitted on 04/04/2023 with wound infection, concern for necrotizing fasciitis. Pharmacy has been consulted for Zosyn dosing.  Plan: -Zosyn 3.375 g IV q8h extended infusion -Also on linezolid 600 mg IV BID -Continue to follow renal function, cultures and clinical progress for dose adjustments and de-escalation as indicated  Height: 6' (182.9 cm) Weight: 80.2 kg (176 lb 12.9 oz) IBW/kg (Calculated) : 77.6  Temp (24hrs), Avg:96.3 F (35.7 C), Min:94.3 F (34.6 C), Max:97.5 F (36.4 C)  Recent Labs  Lab 04/04/23 1207 04/04/23 1220 04/04/23 1453  WBC 9.1  --   --   CREATININE 2.09*  --   --   LATICACIDVEN  --  1.1 0.7    Estimated Creatinine Clearance: 23.2 mL/min (A) (by C-G formula based on SCr of 2.09 mg/dL (H)).    No Known Allergies  Antimicrobials this admission: Linezolid 4/3 >> Zosyn 4/3 >>  Dose adjustments this admission: NA  Microbiology results: 4/3 BCx: pending   Thank you for allowing pharmacy to be a part of this patient's care.  Pricilla Riffle, PharmD, BCPS Clinical Pharmacist 04/04/2023 8:27 PM

## 2023-04-04 NOTE — ED Notes (Signed)
 Warm blankets applied to pt for low temperature

## 2023-04-04 NOTE — ED Notes (Signed)
 Pt to MRI with tech

## 2023-04-04 NOTE — ED Provider Notes (Signed)
 [place] EMERGENCY DEPARTMENT AT [facility] Provider Note   CSN: [id number] Arrival date & time: 04/04/23  1141     History {Add pertinent medical, surgical, social history, OB history to HPI:1} Chief Complaint  Patient presents with  . Leg Swelling    [name] is a 89 y.o. male sent in by his primary care physician to rule out potential necrotizing fasciitis.  He has a past medical history of chronic peripheral edema, tinea cruris. Patient reports that he has had cellulitis of the right lower extremity.  He was sent in by his primary care with outpatient paperwork in hand.  He has been on both ketoconazole cream for his groin, Keflex and then also began on Bactrim.  His PCP notes that he has had some improvement of the erythema of his medial thigh however he has become  very tender in his right thigh muscle and he is concern for potential deep space infection versus necrotizing fasciitis.    HPI     Home Medications Prior to Admission medications   Medication Sig Start Date End Date Taking? Authorizing Provider  acetaminophen (TYLENOL) 325 MG tablet Take 650 mg by mouth every 6 (six) hours as needed for mild pain or headache.    [provider]  allopurinol (ZYLOPRIM) 300 MG tablet Take 150 mg by mouth daily.     [provider]  aspirin EC 81 MG tablet Take 81 mg by mouth 2 (two) times a week. Swallow whole.    [provider]  atorvastatin (LIPITOR) 10 MG tablet TAKE 1 TABLET (10 MG TOTAL) BY MOUTH DAILY AT 6 PM. 05/17/22   Runell Gess, MD  cephALEXin (KEFLEX) 500 MG capsule Take 1 capsule (500 mg total) by mouth 4 (four) times daily for 5 days. 04/01/23 04/06/23  Gloris Manchester, MD  diltiazem (CARDIZEM CD) 120 MG 24 hr capsule Take 1 capsule (120 mg total) by mouth daily. Please call [phone] to schedule a  July appointment for future refills. Thank you. 02/12/23 05/13/23  Runell Gess, MD  lisinopril (ZESTRIL) 20 MG tablet  Take 20 mg by mouth daily. 12/01/19   [provider]  omeprazole (PRILOSEC OTC) 20 MG tablet Take 20 mg by mouth every other day.    [provider]  polyethylene glycol (MIRALAX / GLYCOLAX) packet Take 17 g by mouth daily as needed for moderate constipation.     [provider]      Allergies    Patient has no known allergies.    Review of Systems   Review of Systems  Physical Exam Updated Vital Signs BP (!) 124/50   Pulse 66   Temp (!) 94.3 F (34.6 C) (Rectal)   Resp 18   Ht 6' (1.829 m)   Wt 80.2 kg   SpO2 97%   BMI 23.98 kg/m  Physical Exam Vitals and nursing note reviewed.  Constitutional:      General: He is not in acute distress.    Appearance: He is well-developed. He is not diaphoretic.     Comments: Well appearing  HENT:     Head: Normocephalic and atraumatic.  Eyes:     General: No scleral icterus.    Conjunctiva/sclera: Conjunctivae normal.  Cardiovascular:     Rate and Rhythm: Normal rate and regular rhythm.     Heart sounds: Normal heart sounds.  Pulmonary:     Effort: Pulmonary effort is normal. No respiratory distress.     Breath  sounds: Normal breath sounds.  Abdominal:     Palpations: Abdomen is soft.     Tenderness: There is no abdominal tenderness.  Musculoskeletal:     Cervical back: Normal range of motion and neck supple.  Skin:    General: Skin is warm and dry.     Comments: Tinea cruris BL peripheral edema R> L Signs of BL stasis dermatitis, small R stasis ulcer with serous drainage. Erythema and pitting edema up the medial R tigh TTP R Thigh tenderness does NOT appear to be out of proportion to exam Patient is moving extremity without significant pain  Neurological:     Mental Status: He is alert.  Psychiatric:        Behavior: Behavior normal.    ED Results / Procedures / Treatments   Labs (all labs ordered are listed, but only abnormal results are displayed) Labs Reviewed  COMPREHENSIVE METABOLIC  PANEL WITH GFR - Abnormal; Notable for the following components:      Result Value   CO2 19 (*)    BUN 79 (*)    Creatinine, Ser 2.09 (*)    Albumin 2.9 (*)    GFR, Estimated 29 (*)    All other components within normal limits  CBC WITH DIFFERENTIAL/PLATELET - Abnormal; Notable for the following components:   RBC 2.66 (*)    Hemoglobin 8.0 (*)    HCT 25.5 (*)    RDW 18.3 (*)    Platelets 147 (*)    nRBC 0.4 (*)    Neutro Abs 7.9 (*)    Lymphs Abs 0.5 (*)    Abs Immature Granulocytes 0.14 (*)    All other components within normal limits  RESP PANEL BY RT-PCR (RSV, FLU A&B, COVID)  RVPGX2  CULTURE, BLOOD (ROUTINE X 2)  CULTURE, BLOOD (ROUTINE X 2)  URINALYSIS, W/ REFLEX TO CULTURE (INFECTION SUSPECTED)  PROTIME-INR  SEDIMENTATION RATE  C-REACTIVE PROTEIN  CK  I-STAT CG4 LACTIC ACID, ED  I-STAT CG4 LACTIC ACID, ED  I-STAT CHEM 8, ED    EKG None  Radiology No results found.  Procedures .Critical Care  Performed by: Arthor Captain, PA-C Authorized by: Arthor Captain, PA-C   Critical care provider statement:    Critical care time (minutes):  80   Critical care time was exclusive of:  Separately billable procedures and treating other patients   Critical care was necessary to treat or prevent imminent or life-threatening deterioration of the following conditions: hypothermia.   Critical care was time spent personally by me on the following activities:  Development of treatment plan with patient or surrogate, discussions with consultants, evaluation of patient's response to treatment, examination of patient, ordering and review of laboratory studies, ordering and review of radiographic studies, ordering and performing treatments and interventions, pulse oximetry, re-evaluation of patient's condition and review of old charts   {Document cardiac monitor, telemetry assessment procedure when appropriate:1}  Medications Ordered in ED Medications  lactated ringers infusion  (has no administration in time range)  lactated ringers bolus 250 mL (has no administration in time range)  piperacillin-tazobactam (ZOSYN) IVPB 3.375 g (has no administration in time range)  linezolid (ZYVOX) IVPB 600 mg (has no administration in time range)    ED Course/ Medical Decision Making/ A&P Clinical Course as of 04/06/23 1759  Thu Apr 04, 2023  1616 Temp(!): 97.2 F (36.2 C) [AH]  1624 I-Stat Lactic Acid, ED [AH]  1624 Resp panel by RT-PCR (RSV, Flu A&B, Covid) Anterior Nasal Swab [AH]  1624 Urinalysis, w/ Reflex to Culture (Infection Suspected) -Urine, Clean Catch(!) [AH]  1624 Comprehensive metabolic panel(!) [AH]  1655 Temp(!): 97.2 F (36.2 C) [AH]  2023 MRI resulted with concern for possible nectrotizing Fasciitis. Patient already admitted to the floor. I duscussed findings with Dr. Jena Gauss via phone. Discussed that outside of hypothermia patient was moving leg well, no pain out of proportion on my exam and my initial impression was cellulitis with failed antibiotic therapy. He will consult will review images and consult. [AH]    Clinical Course User Index [AH] Arthor Captain, PA-C   {   Click here for ABCD2, HEART and other calculatorsREFRESH Note before signing :1}                              Medical Decision Making Amount and/or Complexity of Data Reviewed Labs: ordered. Decision-making details documented in ED Course. Radiology: ordered.  Risk Prescription drug management. Decision regarding hospitalization.    This patient presents to the ED for concern of R leg pain and swelling, this involves an extensive number of treatment options, and is a complaint that carries with it a high risk of complications and morbidity.   The differential diagnosis  for peripheral edema includes systemic illnesses such as heart failure, liver disease,nephrotic syndrome,  malnutrition, lymphedema and thyroid myxedema; local conditions such as pelvic tumors, infection, trauma,  and venous thrombosis; and various medications   Co morbidities: .  has a past medical history of Anemia, Arthritis, AV (angiodysplasia malformation of colon), Benign prostatic hypertrophy, Cancer (HCC), Chronic kidney disease, COPD (chronic obstructive pulmonary disease) (HCC), Coronary artery disease, GERD (gastroesophageal reflux disease), Gout, Heart murmur, History of kidney stones, Hyperlipidemia, Hypertension, Influenza, Mitral regurgitation, Mitral valve prolapse, Pneumonia (1970's), PONV (postoperative nausea and vomiting), and Shortness of breath.   Social Determinants of Health:  . SDOH Screenings   Food Insecurity: No Food Insecurity (04/04/2023)  Housing: Low Risk  (04/04/2023)  Transportation Needs: No Transportation Needs (04/04/2023)  Utilities: Not At Risk (04/04/2023)  Depression (PHQ2-9): Low Risk  (03/25/2023)  Social Connections: Moderately Isolated (04/04/2023)  Tobacco Use: Medium Risk (04/04/2023)     Additional history:  {Additional history obtained from wife {External records from outside source obtained and reviewed including written paperwork from PCP expressing concern for possible Nec fasc  Lab Tests:  I Ordered, and personally interpreted labs.  The pertinent results include:    Wbc wnl Hgb 8.0 ( baseline) CMP - cr 2.09- just above baseline BUN 79- suspect some dehydration  Resp pnl negative  Imaging Studies:  I ordered imaging studies including CXR I independently visualized and interpreted imaging which showed  No acute findings I agree with the radiologist interpretation  Cardiac Monitoring/ECG:  .The patient was maintained on a cardiac monitor.  I personally viewed and interpreted the cardiac monitored which showed an underlying rhythm of:    N b   Medicines ordered and prescription drug management:  I ordered medication including  Medications  lactated ringers infusion (0 mLs Intravenous Stopped 04/05/23 1315)  oxyCODONE (Oxy IR/ROXICODONE)  immediate release tablet 5 mg (has no administration in time range)  acetaminophen (TYLENOL) tablet 650 mg (has no administration in time range)  atorvastatin (LIPITOR) tablet 10 mg (10 mg Oral Given 04/06/23 1645)  polyethylene glycol (MIRALAX / GLYCOLAX) packet 17 g (has no administration in time range)  HYDROmorphone (DILAUDID) injection 0.5-1 mg (has no administration in time range)  traZODone (DESYREL) tablet  25 mg (has no administration in time range)  ondansetron (ZOFRAN) tablet 4 mg (has no administration in time range)    Or  ondansetron (ZOFRAN) injection 4 mg (has no administration in time range)  albuterol (PROVENTIL) (2.5 MG/3ML) 0.083% nebulizer solution 2.5 mg (has no administration in time range)  lactated ringers infusion (0 mLs Intravenous Stopped 04/06/23 1313)  linezolid (ZYVOX) tablet 600 mg (600 mg Oral Given 04/06/23 0913)  lactated ringers bolus 250 mL (0 mLs Intravenous Stopped 04/04/23 1532)  piperacillin-tazobactam (ZOSYN) IVPB 3.375 g (0 g Intravenous Stopped 04/04/23 1539)  linezolid (ZYVOX) IVPB 600 mg (0 mg Intravenous Stopped 04/04/23 1902)  0.9 %  sodium chloride infusion (Manually program via Guardrails IV Fluids) (0 mLs Intravenous Stopped 04/05/23 1642)   for *** Reevaluation of the patient after these medicines showed that the patient {resolved/improved/worsened:23923::"improved"} I have reviewed the patients home medicines and have made adjustments as needed  Test Considered:  .***  Critical Interventions:  .***  Consultations Obtained: ***  Problem List / ED Course:  .   ICD-10-CM   1. Cellulitis of right lower extremity  L03.115       MDM: ***   Dispostion:  After consideration of the diagnostic results and the patients response to treatment, I feel that the patent would benefit from ***.   {Document critical care time when appropriate:1} {Document review of labs and clinical decision tools ie heart score, Chads2Vasc2 etc:1}  {Document your  independent review of radiology images, and any outside records:1} {Document your discussion with family members, caretakers, and with consultants:1} {Document social determinants of health affecting pt's care:1} {Document your decision making why or why not admission, treatments were needed:1} Final Clinical Impression(s) / ED Diagnoses Final diagnoses:  None    Rx / DC Orders ED Discharge Orders     None

## 2023-04-04 NOTE — Sepsis Progress Note (Signed)
 Elink will follow per sepsis protocol.

## 2023-04-04 NOTE — H&P (Signed)
 History and Physical  [name] AVW:[id number] DOB: 23-Jul-1925 DOA: 04/04/2023  PCP: Georgianne Fick, MD   Chief Complaint: Right leg pain, nonhealing infection  HPI: [name] is a 89 y.o. active male with medical history significant for hypertension, hyperlipidemia being admitted to the hospital with concern for right lower extremity cellulitis that failed to improve with outpatient antibiotics and is now found to have necrotizing fasciitis.  History is provided by the patient as well as his wife who is at the bedside.  Starting about 4 days ago, he has had an area of drainage and erythema in the right lower leg, but he has developed tracking erythema despite antibiotics.  About 4 days ago, he was prescribed Keflex, failed to improve and so he was prescribed Bactrim 2 days ago.  He has had increasing pain in the right lower extremity, starting in the right calf and radiating up into his inner thigh.  Patient and his wife state that it hurts the most when he tries to ambulate.  He denies any fevers, chills, nausea, vomiting or any other systemic symptoms.  Review of Systems: Please see HPI for pertinent positives and negatives. A complete 10 system review of systems are otherwise negative.  Past Medical History:  Diagnosis Date   Anemia    low hemoglobin   Arthritis    AV (angiodysplasia malformation of colon)    Benign prostatic hypertrophy    Cancer (HCC)    basal cell cancer   Chronic kidney disease    elevated Cr, BUN, Dr. Caryn Section manages   COPD (chronic obstructive pulmonary disease) (HCC)    Coronary artery disease    GERD (gastroesophageal reflux disease)    Gout    Heart murmur    History of kidney stones    Hyperlipidemia    Hypertension    Influenza    February 2019   Mitral regurgitation    Mitral valve prolapse    Pneumonia 1970's   PONV (postoperative nausea and vomiting)    1st knee replacement   Shortness of breath    Past Surgical History:   Procedure Laterality Date   ABDOMINAL ANGIOGRAM  04/30/2011   Procedure: ABDOMINAL ANGIOGRAM;  Surgeon: Runell Gess, MD;  Location: [facility] CATH LAB;  Service: Cardiovascular;;   CARDIAC CATHETERIZATION  04/2011   x2   CARDIAC CATHETERIZATION  08/2009   CATARACT EXTRACTION  2011   bilateral   COLONOSCOPY  05/04/2011   Procedure: COLONOSCOPY;  Surgeon: Theda Belfast, MD;  Location: [facility] ENDOSCOPY;  Service: Endoscopy;  Laterality: N/A;   COLONOSCOPY N/A 07/16/2013   Procedure: COLONOSCOPY;  Surgeon: Theda Belfast, MD;  Location: [facility] ENDOSCOPY;  Service: Endoscopy;  Laterality: N/A;   COLONOSCOPY WITH PROPOFOL N/A 06/20/2017   Procedure: COLONOSCOPY WITH PROPOFOL;  Surgeon: Jeani Hawking, MD;  Location: [facility] ENDOSCOPY;  Service: Endoscopy;  Laterality: N/A;   CORONARY ANGIOGRAM  04/30/2011   Procedure: CORONARY ANGIOGRAM;  Surgeon: Runell Gess, MD;  Location: [facility] CATH LAB;  Service: Cardiovascular;;   ESOPHAGOGASTRODUODENOSCOPY  05/04/2011   Procedure: ESOPHAGOGASTRODUODENOSCOPY (EGD);  Surgeon: Theda Belfast, MD;  Location: [facility] ENDOSCOPY;  Service: Endoscopy;  Laterality: N/A;   EYE SURGERY     bilateral cataracts removed   HERNIA REPAIR  1999&2000   bilateral   LOBECTOMY  1980   left lower lobectomy for benign tumor   OPEN REDUCTION INTERNAL FIXATION (ORIF) DISTAL RADIAL FRACTURE Left 03/19/2017   Procedure: OPEN REDUCTION INTERNAL FIXATION (ORIF) DISTAL  RADIAL FRACTURE;  Surgeon: Jodi Geralds, MD;  Location: [facility] OR;  Service: Orthopedics;  Laterality: Left;  AXILLARY BLOCK   POLYPECTOMY  06/20/2017   Procedure: POLYPECTOMY;  Surgeon: Jeani Hawking, MD;  Location: [facility] ENDOSCOPY;  Service: Endoscopy;;   REPLACEMENT TOTAL KNEE  2005   right   RIGHT HEART CATHETERIZATION  04/30/2011   Procedure: RIGHT HEART CATH;  Surgeon: Runell Gess, MD;  Location: [facility] CATH LAB;  Service: Cardiovascular;;   TEE WITHOUT CARDIOVERSION  04/30/2011   Procedure: TRANSESOPHAGEAL ECHOCARDIOGRAM (TEE);  Surgeon:  Thurmon Fair, MD;  Location: [facility] ENDOSCOPY;  Service: Cardiovascular;  Laterality: N/A;  3D TEE/ patient will be admitted the day before test , therefo patient will be a inpatient the day of TEE   TOTAL KNEE ARTHROPLASTY Left 02/15/2012   Procedure: TOTAL KNEE ARTHROPLASTY;  Surgeon: Harvie Junior, MD;  Location: [facility] OR;  Service: Orthopedics;  Laterality: Left;   Social History:  reports that he quit smoking about 62 years ago. His smoking use included cigarettes. He started smoking about 82 years ago. He quit smokeless tobacco use about 62 years ago. He reports current alcohol use of about 7.0 standard drinks of alcohol per week. He reports that he does not use drugs.  No Known Allergies  Family History  Problem Relation Age of Onset   Hypertension Mother    Osteoporosis Mother    Lung cancer Father    Irregular heart beat Brother    Heart attack Maternal Grandfather    Cancer Paternal Grandfather      Prior to Admission medications   Medication Sig Start Date End Date Taking? Authorizing Provider  acetaminophen (TYLENOL) 325 MG tablet Take 650 mg by mouth every 6 (six) hours as needed for mild pain or headache.    [provider]  allopurinol (ZYLOPRIM) 300 MG tablet Take 150 mg by mouth daily.     [provider]  aspirin EC 81 MG tablet Take 81 mg by mouth 2 (two) times a week. Swallow whole.    [provider]  atorvastatin (LIPITOR) 10 MG tablet TAKE 1 TABLET (10 MG TOTAL) BY MOUTH DAILY AT 6 PM. 05/17/22   Runell Gess, MD  cephALEXin (KEFLEX) 500 MG capsule Take 1 capsule (500 mg total) by mouth 4 (four) times daily for 5 days. 04/01/23 04/06/23  Gloris Manchester, MD  diltiazem (CARDIZEM CD) 120 MG 24 hr capsule Take 1 capsule (120 mg total) by mouth daily. Please call [phone] to schedule a  July appointment for future refills. Thank you. 02/12/23 05/13/23  Runell Gess, MD  lisinopril (ZESTRIL) 20 MG tablet Take 20 mg by mouth daily. 12/01/19    [provider]  omeprazole (PRILOSEC OTC) 20 MG tablet Take 20 mg by mouth every other day.    [provider]  polyethylene glycol (MIRALAX / GLYCOLAX) packet Take 17 g by mouth daily as needed for moderate constipation.     [provider]    Physical Exam: BP (!) 131/41 (BP Location: Left Arm)   Pulse 63   Temp (!) 97.2 F (36.2 C) (Oral)   Resp (!) 23   Ht 6' (1.829 m)   Wt 80.2 kg   SpO2 97%   BMI 23.98 kg/m  General:  Alert, oriented x4, calm, in no acute distress, looks comfortable, his wife is at the bedside, he looks nontoxic. Cardiovascular: RRR, no murmurs or rubs, no peripheral edema  Respiratory: clear to auscultation bilaterally, no wheezes, no  crackles  Abdomen: soft, nontender, nondistended, normal bowel tones heard  Skin: He has bilateral lower extremity venous stasis changes, he has bilateral warmth and mild erythema.  There is a small area with clear drainage on the right anterior lower leg.  No areas of fluctuance.  He does have tracking minimally tender erythema of the inner right thigh. Musculoskeletal: no joint effusions, normal range of motion  Psychiatric: appropriate affect, normal speech  Neurologic: extraocular muscles intact, clear speech, moving all extremities with intact sensorium         Labs on Admission:  Basic Metabolic Panel: Recent Labs  Lab 04/04/23 1207  NA 138  K 4.5  CL 110  CO2 19*  GLUCOSE 90  BUN 79*  CREATININE 2.09*  CALCIUM 8.9   Liver Function Tests: Recent Labs  Lab 04/04/23 1207  AST 33  ALT 23  ALKPHOS 85  BILITOT 0.4  PROT 6.5  ALBUMIN 2.9*   No results for input(s): "LIPASE", "AMYLASE" in the last 168 hours. No results for input(s): "AMMONIA" in the last 168 hours. CBC: Recent Labs  Lab 04/04/23 1207  WBC 9.1  NEUTROABS 7.9*  HGB 8.0*  HCT 25.5*  MCV 95.9  PLT 147*   Cardiac Enzymes: Recent Labs  Lab 04/04/23 1207  CKTOTAL 49   BNP (last 3 results) No results for  input(s): "BNP" in the last 8760 hours.  ProBNP (last 3 results) No results for input(s): "PROBNP" in the last 8760 hours.  CBG: No results for input(s): "GLUCAP" in the last 168 hours.  Radiological Exams on Admission: MR [facility] RIGHT WO CONTRAST Result Date: 04/04/2023 CLINICAL DATA:  Right leg cellulitis. Swelling and pain. Concern for necrotizing fasciitis. EXAM: MRI OF THE RIGHT FEMUR WITHOUT CONTRAST TECHNIQUE: Multiplanar, multisequence MR imaging of the right femur was performed. No intravenous contrast was administered. COMPARISON:  None Available. FINDINGS: Bones/Joint/Cartilage No fracture or dislocation. Right knee arthroplasty with associated susceptibility artifact. Otherwise, no discrete marrow signal abnormality. Soft tissue/Muscles Subcutaneous edema and overlying cutaneous thickening extending along the right lateral hip and circumferential thigh to the level of the knee. Edema extends to the superficial fascia of the anterior and posterior compartment musculature of the thigh. There is increased abnormal T2/STIR hyperintense signal extending from the superficial fascia of the right quadriceps musculature into deeper intramuscular fascial planes, most notably along the vastus intermedius, vastus lateralis, and rectus femoris deep intramuscular fascial planes. Similar changes are noted involving the posterior compartment hamstring musculature, with pronounced fluid extending along the biceps femoris, semitendinosus, and semimembranosus deep intramuscular fascial planes. No loculated collection identified. There is associated intramuscular edema. IMPRESSION: 1. Findings concerning for necrotizing fasciitis of the right thigh with abnormal irregular fluid signal extending throughout the superficial to deep intramuscular fascial planes of the right quadriceps and hamstrings musculature. No loculated collection identified. There is associated intramuscular edema. 2. Diffuse subcutaneous edema  and cutaneous thickening of the right thigh, compatible with cellulitis. Electronically Signed   By: Hart Robinsons M.D.   On: 04/04/2023 19:24   DG Chest Port 1 View Result Date: 04/04/2023 CLINICAL DATA:  Questionable sepsis EXAM: PORTABLE CHEST 1 VIEW COMPARISON:  Chest x-ray 02/11/2017 FINDINGS: The heart is enlarged. Surgical clips overlie the left hilum, unchanged. There some minimal scarring or atelectasis in the left costophrenic angle. There is no pleural effusion or pneumothorax. No acute fractures are seen. IMPRESSION: 1. Cardiomegaly. 2. Minimal scarring or atelectasis in the left costophrenic angle. Electronically Signed   By: Darliss Cheney  M.D.   On: 04/04/2023 15:22   Assessment/Plan Denna Haggard [name] is a 89 y.o. active male with medical history significant for hypertension, hyperlipidemia being admitted to the hospital with concern for right lower extremity cellulitis that failed to improve with outpatient antibiotics and is now found to have necrotizing fasciitis.  Necrotizing fasciitis-seen on MRI imaging of the right lower extremity this evening.  Patient presented with some hypothermia, but has been hemodynamically stable, without leukocytosis or lactic acidosis. -Inpatient admission to stepdown -Continue LR infusion -Empiric IV linezolid and IV Zosyn -ER provider has urgently consulted orthopedic surgery Dr. Jena Gauss, with whom I also communicated via secure chat -Per Dr. Jena Gauss request, patient will be transferred to [facility], may need surgical debridement if not improving tomorrow -Continue regular diet for now, n.p.o. after midnight -Pain and nausea control as needed, with bowel regimen  Hypertension-will hold home Cardizem and lisinopril due to borderline low blood pressures and risk of sepsis  Acute on chronic kidney disease-likely due to acute infection and relative hypotension -Hold lisinopril -Hydrating with LR as above -Avoid nephrotoxins, and monitor  renal function with daily labs  Chronic iron deficiency anemia and normocytic anemia due to CKD-he is followed by Dr. Arbutus Ped and receives intermittent IV iron infusions.  Hemoglobin appears stable at the low end of his normal range.  Per family, he is due for iron infusion soon.  Hyperlipidemia-Lipitor  DVT prophylaxis: SCDs only, in case surgical intervention is required    Code Status: Do not attempt resuscitation (DNR) PRE-ARREST INTERVENTIONS DESIRED  Consults called: ER provider discussed with orthopedic surgery Dr. Jena Gauss.  Admission status: The appropriate patient status for this patient is INPATIENT. Inpatient status is judged to be reasonable and necessary in order to provide the required intensity of service to ensure the patient's safety. The patient's presenting symptoms, physical exam findings, and initial radiographic and laboratory data in the context of their chronic comorbidities is felt to place them at high risk for further clinical deterioration. Furthermore, it is not anticipated that the patient will be medically stable for discharge from the hospital within 2 midnights of admission.    I certify that at the point of admission it is my clinical judgment that the patient will require inpatient hospital care spanning beyond 2 midnights from the point of admission due to high intensity of service, high risk for further deterioration and high frequency of surveillance required  Time spent: 65 minutes  Kresha Abelson Sharlette Dense MD [facility] Pager [phone]  If 7PM-7AM, please contact night-coverage [email or web address] Password [facility]  04/04/2023, 5:57 PM

## 2023-04-04 NOTE — ED Triage Notes (Signed)
 Pt has had ongoing issue with right leg, currently being treated for cellulitis. Redness, swelling and pain is worsening after 3 days on abx. PCP sent pt concerned for necrotizing fascitis.

## 2023-04-04 NOTE — ED Notes (Signed)
 Handoff given to [place], Charity fundraiser.

## 2023-04-04 NOTE — Progress Notes (Signed)
 Ortho Note  Received call from Arthor Captain, PA-C regarding MRI performed earlier today for this patient with concern for necrotizing fascitis. Reviewed imaging and there is increased edema along fascial planes. Laboratory work shows elevated inflammatory markers but hemodynamically stable. Would recommend monitoring response to IV antibiotics with NPO at midnight for possible debridement. I have asked for transfer to [facility] for potentially surgery if needed as I would likely involve my colleague Aldean Baker.  Roby Lofts, MD [facility] [phone] (office) [email or web address]

## 2023-04-04 NOTE — ED Notes (Signed)
 Patient is

## 2023-04-05 ENCOUNTER — Encounter: Payer: Self-pay | Admitting: Physician Assistant

## 2023-04-05 ENCOUNTER — Other Ambulatory Visit (HOSPITAL_COMMUNITY): Payer: Self-pay

## 2023-04-05 ENCOUNTER — Ambulatory Visit

## 2023-04-05 ENCOUNTER — Telehealth (HOSPITAL_COMMUNITY): Payer: Self-pay

## 2023-04-05 DIAGNOSIS — Z87891 Personal history of nicotine dependence: Secondary | ICD-10-CM

## 2023-04-05 DIAGNOSIS — B9689 Other specified bacterial agents as the cause of diseases classified elsewhere: Secondary | ICD-10-CM

## 2023-04-05 DIAGNOSIS — M726 Necrotizing fasciitis: Secondary | ICD-10-CM | POA: Diagnosis not present

## 2023-04-05 LAB — BASIC METABOLIC PANEL WITH GFR
Anion gap: 9 (ref 5–15)
BUN: 63 mg/dL — ABNORMAL HIGH (ref 8–23)
CO2: 19 mmol/L — ABNORMAL LOW (ref 22–32)
Calcium: 8.4 mg/dL — ABNORMAL LOW (ref 8.9–10.3)
Chloride: 109 mmol/L (ref 98–111)
Creatinine, Ser: 1.91 mg/dL — ABNORMAL HIGH (ref 0.61–1.24)
GFR, Estimated: 32 mL/min — ABNORMAL LOW (ref >60)
Glucose, Bld: 99 mg/dL (ref 70–99)
Potassium: 4.7 mmol/L (ref 3.5–5.1)
Sodium: 137 mmol/L (ref 135–145)

## 2023-04-05 LAB — CBC
HCT: 22.1 % — ABNORMAL LOW (ref 39.0–52.0)
Hemoglobin: 6.9 g/dL — CL (ref 13.0–17.0)
MCH: 29.7 pg (ref 26.0–34.0)
MCHC: 31.2 g/dL (ref 30.0–36.0)
MCV: 95.3 fL (ref 80.0–100.0)
Platelets: 116 10*3/uL — ABNORMAL LOW (ref 150–400)
RBC: 2.32 MIL/uL — ABNORMAL LOW (ref 4.22–5.81)
RDW: 18.5 % — ABNORMAL HIGH (ref 11.5–15.5)
WBC: 6.4 10*3/uL (ref 4.0–10.5)
nRBC: 0.3 % — ABNORMAL HIGH (ref 0.0–0.2)

## 2023-04-05 LAB — HEMOGLOBIN AND HEMATOCRIT, BLOOD
HCT: 26.8 % — ABNORMAL LOW (ref 39.0–52.0)
Hemoglobin: 8.2 g/dL — ABNORMAL LOW (ref 13.0–17.0)

## 2023-04-05 LAB — PREPARE RBC (CROSSMATCH)

## 2023-04-05 MED ORDER — SODIUM CHLORIDE 0.9 % IV SOLN
3.0000 g | Freq: Two times a day (BID) | INTRAVENOUS | Status: DC
  Filled 2023-04-05: qty 8

## 2023-04-05 MED ORDER — SODIUM CHLORIDE 0.9% IV SOLUTION: Freq: Once | INTRAVENOUS | Status: AC

## 2023-04-05 MED ORDER — LINEZOLID 600 MG PO TABS
600.0000 mg | ORAL_TABLET | Freq: Two times a day (BID) | ORAL | Status: DC
  Administered 2023-04-05 – 2023-04-08 (×6): 600 mg via ORAL
  Filled 2023-04-05 (×6): qty 1

## 2023-04-05 MED ORDER — IRON SUCROSE 20 MG/ML IV SOLN: 200.0000 mg | Freq: Once | INTRAVENOUS | Status: DC

## 2023-04-05 MED ORDER — LACTATED RINGERS IV SOLN: INTRAVENOUS | Status: AC

## 2023-04-05 MED ORDER — ACETAMINOPHEN 325 MG PO TABS: 650.0000 mg | ORAL_TABLET | Freq: Once | ORAL | Status: DC

## 2023-04-05 NOTE — Progress Notes (Signed)
 PROGRESS NOTE  [name]  XBJ:[id number] DOB: [birth year] DOA: 04/04/2023 PCP: Georgianne Fick, MD   Brief Narrative: Patient is a 89 year old male with history of hypertension, hyperlipidemia , chronic iron deficiency anemia who presented with complaint of  pain, tenderness, erythema of right lower leg.  He was treated for cellulitis with outpatient antibiotics but patient failed to improve.  Presented with increased pain radiating up to right calf/inner thigh.  Pain on ambulation.  No fever or chills.  On presentation, he was hemodynamically stable without leukocytosis or lactic acidosis.  MRI showed features of necrotizing fasciitis of the right lower extremity.  Orthopedics consulted.  Plan for conservative management for now.  ID consulted  Assessment & Plan:  Principal Problem:   Cellulitis, leg Active Problems:   Necrotizing fasciitis (HCC)  Necrotizing fasciitis of the right lower extremity: Presented with pain, erythema of the right lower extremity.  Was treated for cellulitis as an outpatient but did not respond.  MRI done. Findings concerning for necrotizing fasciitis of the right thigh ,no loculated collection, showed associated intramuscular edema,diffuse subcutaneous edema and cutaneous thickening of the right thigh, compatible with cellulitis. Orthopedics already following.  Continue broad-spectrum antibiotics.  Currently on linezolid and augmentin.  Initial plan was for debridement but currently on hold.  ID consulted. Will follow-up cultures.  Currently afebrile, no leukocytosis  AKI in CKD stage IIIb: Looks like his baseline creatinine around 1.6.  Presented with creatinine in the range of 2.  Likely prerenal AKI from dehydration, poor oral intake, relative hypotension.  Patient was also taking Bactrim, lisinopril as an outpatient.  Continue gentle IV fluids.  Monitor kidney function.  Improving  Chronic normocytic/iron deficiency anemia: Chronic normocytic  anemia, iron-deficiency.  Baseline hemoglobin around 8.  Follows with hematology, receives intermittent IV infusion.  Currently hemoglobin in the range of 6.  Likely from hemodilution.  No evidence of acute blood loss.  Given unit of blood transfusion today  Hypertension: Blood pressure was soft on presentation.  Home Cardizem, lisinopril on hold.  Hyperlipidemia: On Lipitor         Pressure Injury 04/04/23 Perineum Left;Right Stage 2 -  Partial thickness loss of dermis presenting as a shallow open injury with a red, pink wound bed without slough. (Active)  04/04/23 2030  Location: Perineum  Location Orientation: Left;Right  Staging: Stage 2 -  Partial thickness loss of dermis presenting as a shallow open injury with a red, pink wound bed without slough.  Wound Description (Comments):   Present on Admission: Yes  Dressing Type Foam - Lift dressing to assess site every shift 04/04/23 1932    DVT prophylaxis:SCDs Start: 04/04/23 2026     Code Status: Do not attempt resuscitation (DNR) PRE-ARREST INTERVENTIONS DESIRED  Family Communication: Discussed with wife at bedside on 4/4  Patient status:Inpatient  Patient is from :Home  Anticipated discharge HY:QMVH  Estimated DC date:2-3 days   Consultants: Orthopedics  Procedures:None  Antimicrobials:  Anti-infectives (From admission, onward)    Start     Dose/Rate Route Frequency Ordered Stop   04/05/23 0400  linezolid (ZYVOX) IVPB 600 mg        600 mg 300 mL/hr over 60 Minutes Intravenous Every 12 hours 04/04/23 2022     04/04/23 2200  piperacillin-tazobactam (ZOSYN) IVPB 3.375 g        3.375 g 12.5 mL/hr over 240 Minutes Intravenous Every 8 hours 04/04/23 2025     04/04/23 1430  piperacillin-tazobactam (ZOSYN) IVPB 3.375 g  3.375 g 100 mL/hr over 30 Minutes Intravenous  Once 04/04/23 1415 04/04/23 1539   04/04/23 1430  linezolid (ZYVOX) IVPB 600 mg        600 mg 300 mL/hr over 60 Minutes Intravenous  Once  04/04/23 1415 04/04/23 1902       Subjective: Patient seen and examined at bedside today.  Hemodynamically stable.  Comfortable lying in bed.  Not in significant distress.  Denies significant pain or discomfort on the right lower extremity.  Wife at bedside.  Afebrile this morning  Objective: Vitals:   04/04/23 1932 04/04/23 2207 04/05/23 0227 04/05/23 0629  BP: (!) 118/50 (!) 117/41 (!) 115/49 (!) 114/53  Pulse: 63 65 62 62  Resp: 16 20 16 16   Temp: (!) 97.5 F (36.4 C) (!) 97.4 F (36.3 C) (!) 97.3 F (36.3 C) (!) 97.3 F (36.3 C)  TempSrc: Oral  Oral Oral  SpO2: 98% 100% 97% 96%  Weight:      Height:        Intake/Output Summary (Last 24 hours) at 04/05/2023 0758 Last data filed at 04/05/2023 0600 Gross per 24 hour  Intake 1790.36 ml  Output 200 ml  Net 1590.36 ml   Filed Weights   04/04/23 1156  Weight: 80.2 kg    Examination:  General exam: Overall comfortable, not in distress, pleasant elderly male HEENT: PERRL Respiratory system:  no wheezes or crackles  Cardiovascular system: S1 & S2 heard, RRR.  Gastrointestinal system: Abdomen is nondistended, soft and nontender. Central nervous system: Alert and oriented Extremities: Mild edema, erythema of right lower extremity.  Shallow ulcer on the proximal part of the right leg Skin: scaterred bruises   Data Reviewed: I have personally reviewed following labs and imaging studies  CBC: Recent Labs  Lab 04/04/23 1207  WBC 9.1  NEUTROABS 7.9*  HGB 8.0*  HCT 25.5*  MCV 95.9  PLT 147*   Basic Metabolic Panel: Recent Labs  Lab 04/04/23 1207  NA 138  K 4.5  CL 110  CO2 19*  GLUCOSE 90  BUN 79*  CREATININE 2.09*  CALCIUM 8.9     Recent Results (from the past 240 hours)  Resp panel by RT-PCR (RSV, Flu A&B, Covid) Anterior Nasal Swab     Status: None   Collection Time: 04/04/23  2:28 PM   Specimen: Anterior Nasal Swab  Result Value Ref Range Status   SARS Coronavirus 2 by RT PCR NEGATIVE NEGATIVE  Final    Comment: (NOTE) SARS-CoV-2 target nucleic acids are NOT DETECTED.  The SARS-CoV-2 RNA is generally detectable in upper respiratory specimens during the acute phase of infection. The lowest concentration of SARS-CoV-2 viral copies this assay can detect is 138 copies/mL. A negative result does not preclude SARS-Cov-2 infection and should not be used as the sole basis for treatment or other patient management decisions. A negative result may occur with  improper specimen collection/handling, submission of specimen other than nasopharyngeal swab, presence of viral mutation(s) within the areas targeted by this assay, and inadequate number of viral copies(<138 copies/mL). A negative result must be combined with clinical observations, patient history, and epidemiological information. The expected result is Negative.  Fact Sheet for Patients:  BloggerCourse.com  Fact Sheet for Healthcare Providers:  SeriousBroker.it  This test is no t yet approved or cleared by the [place] FDA and  has been authorized for detection and/or diagnosis of SARS-CoV-2 by FDA under an Emergency Use Authorization (EUA). This EUA will remain  in effect (meaning  this test can be used) for the duration of the COVID-19 declaration under Section 564(b)(1) of the Act, 21 U.S.C.section 360bbb-3(b)(1), unless the authorization is terminated  or revoked sooner.       Influenza A by PCR NEGATIVE NEGATIVE Final   Influenza B by PCR NEGATIVE NEGATIVE Final    Comment: (NOTE) The Xpert Xpress SARS-CoV-2/FLU/RSV plus assay is intended as an aid in the diagnosis of influenza from Nasopharyngeal swab specimens and should not be used as a sole basis for treatment. Nasal washings and aspirates are unacceptable for Xpert Xpress SARS-CoV-2/FLU/RSV testing.  Fact Sheet for Patients: BloggerCourse.com  Fact Sheet for Healthcare  Providers: SeriousBroker.it  This test is not yet approved or cleared by the [place] FDA and has been authorized for detection and/or diagnosis of SARS-CoV-2 by FDA under an Emergency Use Authorization (EUA). This EUA will remain in effect (meaning this test can be used) for the duration of the COVID-19 declaration under Section 564(b)(1) of the Act, 21 U.S.C. section 360bbb-3(b)(1), unless the authorization is terminated or revoked.     Resp Syncytial Virus by PCR NEGATIVE NEGATIVE Final    Comment: (NOTE) Fact Sheet for Patients: BloggerCourse.com  Fact Sheet for Healthcare Providers: SeriousBroker.it  This test is not yet approved or cleared by the [place] FDA and has been authorized for detection and/or diagnosis of SARS-CoV-2 by FDA under an Emergency Use Authorization (EUA). This EUA will remain in effect (meaning this test can be used) for the duration of the COVID-19 declaration under Section 564(b)(1) of the Act, 21 U.S.C. section 360bbb-3(b)(1), unless the authorization is terminated or revoked.  Performed at [facility], [address] [place], [place] [address]   Blood Culture (routine x 2)     Status: None (Preliminary result)   Collection Time: 04/04/23  3:23 PM   Specimen: BLOOD LEFT ARM  Result Value Ref Range Status   Specimen Description   Final    BLOOD LEFT ARM Performed at [facility], [address], [place], [place] [address]    Special Requests   Final    BOTTLES DRAWN AEROBIC AND ANAEROBIC Blood Culture results may not be optimal due to an inadequate volume of blood received in culture bottles Performed at [facility], [address], [place], [place] [address]    Culture PENDING  Incomplete   Report Status PENDING  Incomplete     Radiology Studies: MR [facility] RIGHT WO CONTRAST Result Date: 04/04/2023 CLINICAL  DATA:  Right leg cellulitis. Swelling and pain. Concern for necrotizing fasciitis. EXAM: MRI OF THE RIGHT FEMUR WITHOUT CONTRAST TECHNIQUE: Multiplanar, multisequence MR imaging of the right femur was performed. No intravenous contrast was administered. COMPARISON:  None Available. FINDINGS: Bones/Joint/Cartilage No fracture or dislocation. Right knee arthroplasty with associated susceptibility artifact. Otherwise, no discrete marrow signal abnormality. Soft tissue/Muscles Subcutaneous edema and overlying cutaneous thickening extending along the right lateral hip and circumferential thigh to the level of the knee. Edema extends to the superficial fascia of the anterior and posterior compartment musculature of the thigh. There is increased abnormal T2/STIR hyperintense signal extending from the superficial fascia of the right quadriceps musculature into deeper intramuscular fascial planes, most notably along the vastus intermedius, vastus lateralis, and rectus femoris deep intramuscular fascial planes. Similar changes are noted involving the posterior compartment hamstring musculature, with pronounced fluid extending along the biceps femoris, semitendinosus, and semimembranosus deep intramuscular fascial planes. No loculated collection identified. There is associated intramuscular edema. IMPRESSION:  1. Findings concerning for necrotizing fasciitis of the right thigh with abnormal irregular fluid signal extending throughout the superficial to deep intramuscular fascial planes of the right quadriceps and hamstrings musculature. No loculated collection identified. There is associated intramuscular edema. 2. Diffuse subcutaneous edema and cutaneous thickening of the right thigh, compatible with cellulitis. Electronically Signed   By: Hart Robinsons M.D.   On: 04/04/2023 19:24   DG Chest Port 1 View Result Date: 04/04/2023 CLINICAL DATA:  Questionable sepsis EXAM: PORTABLE CHEST 1 VIEW COMPARISON:  Chest x-ray  02/11/2017 FINDINGS: The heart is enlarged. Surgical clips overlie the left hilum, unchanged. There some minimal scarring or atelectasis in the left costophrenic angle. There is no pleural effusion or pneumothorax. No acute fractures are seen. IMPRESSION: 1. Cardiomegaly. 2. Minimal scarring or atelectasis in the left costophrenic angle. Electronically Signed   By: Darliss Cheney M.D.   On: 04/04/2023 15:22    Scheduled Meds:  atorvastatin  10 mg Oral q1800   Continuous Infusions:  lactated ringers 100 mL/hr at 04/05/23 0551   linezolid (ZYVOX) IV 600 mg (04/05/23 0423)   piperacillin-tazobactam (ZOSYN)  IV 3.375 g (04/05/23 0553)     LOS: 1 day   [name], MD [facility] P4/04/2023, 7:58 AM

## 2023-04-05 NOTE — TOC Initial Note (Signed)
 Transition of Care [facility]) - Initial/Assessment Note    Patient Details  Name: [name] MRN: [id number] Date of Birth: [birth year]  [facility] CM/SW Contact:    Lanier Clam, RN Phone Number: 04/05/2023, 11:03 AM  Clinical Narrative:  d/c plan home. Ortho following possible debridement r leg. Continue to monitor.                 Expected Discharge Plan: Home/Self Care Barriers to Discharge: Continued Medical Work up   Patient Goals and CMS Choice Patient states their goals for this hospitalization and ongoing recovery are:: Home CMS Medicare.gov Compare Post Acute Care list provided to:: Patient Represenative (must comment) (Doris(spouse)) Choice offered to / list presented to : Spouse      Expected Discharge Plan and Services   Discharge Planning Services: CM Consult                                          Prior Living Arrangements/Services       Do you feel safe going back to the place where you live?: Yes          Current home services: DME (rw)    Activities of Daily Living   ADL Screening (condition at time of admission) Independently performs ADLs?: No Does the patient have a NEW difficulty with bathing/dressing/toileting/self-feeding that is expected to last >3 days?: Yes (Initiates electronic notice to provider for possible OT consult) Does the patient have a NEW difficulty with getting in/out of bed, walking, or climbing stairs that is expected to last >3 days?: Yes (Initiates electronic notice to provider for possible PT consult) Does the patient have a NEW difficulty with communication that is expected to last >3 days?: Yes (Initiates electronic notice to provider for possible SLP consult) Is the patient deaf or have difficulty hearing?: Yes Does the patient have difficulty seeing, even when wearing glasses/contacts?: No Does the patient have difficulty concentrating, remembering, or making decisions?: No  Permission  Sought/Granted Permission sought to share information with : Case Manager Permission granted to share information with : Yes, Verbal Permission Granted              Emotional Assessment              Admission diagnosis:  Cellulitis, leg [L03.119] Necrotizing fasciitis (HCC) [M72.6] Patient Active Problem List   Diagnosis Date Noted   Cellulitis, leg 04/04/2023   Necrotizing fasciitis (HCC) 04/04/2023   Iron deficiency anemia 03/20/2023   Renal insufficiency    Lung nodule    GI bleed 06/14/2017   Displaced fracture of distal end of left radius 03/19/2017   Hyponatremia 02/19/2017   GIB (gastrointestinal bleeding) 06/30/2013   Postoperative anemia due to acute blood loss 02/18/2012   Osteoarthritis of left knee 02/15/2012   Hyperlipidemia    Normocytic anemia 05/01/2011   Dyspnea 04/25/2011   Mitral regurgitation, severe 04/25/2011   CAD, single vessel RCA 04/25/2011   HTN (hypertension) 04/25/2011   Dyslipidemia 04/25/2011   Chronic renal insufficiency, stage III (moderate) (HCC) 04/25/2011   Gout 04/25/2011   PCP:  Georgianne Fick, MD Pharmacy:   [facility] [phone] [place], [place] - [address] [place] [address] Phone: [phone] Fax: [phone]     Social Drivers of Health (SDOH) Social History: SDOH Screenings   Food Insecurity: No Food Insecurity (04/04/2023)  Housing:  Low Risk  (04/04/2023)  Transportation Needs: No Transportation Needs (04/04/2023)  Utilities: Not At Risk (04/04/2023)  Depression (PHQ2-9): Low Risk  (03/25/2023)  Social Connections: Moderately Isolated (04/04/2023)  Tobacco Use: Medium Risk (04/04/2023)   SDOH Interventions:     Readmission Risk Interventions     No data to display

## 2023-04-05 NOTE — Telephone Encounter (Signed)
 Pharmacy Patient Advocate Encounter  Insurance verification completed.    The patient is insured through  [employer] . Patient has Medicare and is not eligible for a copay card, but may be able to apply for patient assistance or Medicare RX Payment Plan (Patient Must reach out to their plan, if eligible for payment plan), if available.    Ran test claim for LINEZOLID and the current 30 day co-pay is $100.00   This test claim was processed through [facility]- copay amounts may vary at other pharmacies due to [employer], or as the patient moves through the different stages of their insurance plan.

## 2023-04-05 NOTE — Consult Note (Signed)
 Reason for Consult:R/o Brandt Loosen Referring Physician: Ali Lowe Time called: 0730 Time at bedside: 0903   [name] is an 89 y.o. male.  HPI: Mat Carne developed pain and redness of his RLE about 2w ago. He denies any antecedent event. It got worse 7-10d ago and he sought treatment. He was placed on Keflex, failed to improve, then placed on Bactrim and failed to improve. He was seen in the ED yesterday and was admitted. MRI showed features c/w nec fasc and orthopedic surgery was consulted. He notes he's about the same this morning as yesterday. He denies fevers, chills, sweats, N/V. Does not have pain at rest, only when ambulating. He lives at home with his wife and uses a RW to ambulate.  Past Medical History:  Diagnosis Date   Anemia    low hemoglobin   Arthritis    AV (angiodysplasia malformation of colon)    Benign prostatic hypertrophy    Cancer (HCC)    basal cell cancer   Chronic kidney disease    elevated Cr, BUN, Dr. Caryn Section manages   COPD (chronic obstructive pulmonary disease) (HCC)    Coronary artery disease    GERD (gastroesophageal reflux disease)    Gout    Heart murmur    History of kidney stones    Hyperlipidemia    Hypertension    Influenza    February 2019   Mitral regurgitation    Mitral valve prolapse    Pneumonia 1970's   PONV (postoperative nausea and vomiting)    1st knee replacement   Shortness of breath     Past Surgical History:  Procedure Laterality Date   ABDOMINAL ANGIOGRAM  04/30/2011   Procedure: ABDOMINAL ANGIOGRAM;  Surgeon: Runell Gess, MD;  Location: [facility] CATH LAB;  Service: Cardiovascular;;   CARDIAC CATHETERIZATION  04/2011   x2   CARDIAC CATHETERIZATION  08/2009   CATARACT EXTRACTION  2011   bilateral   COLONOSCOPY  05/04/2011   Procedure: COLONOSCOPY;  Surgeon: Theda Belfast, MD;  Location: [facility] ENDOSCOPY;  Service: Endoscopy;  Laterality: N/A;   COLONOSCOPY N/A 07/16/2013   Procedure: COLONOSCOPY;  Surgeon: Theda Belfast,  MD;  Location: [facility] ENDOSCOPY;  Service: Endoscopy;  Laterality: N/A;   COLONOSCOPY WITH PROPOFOL N/A 06/20/2017   Procedure: COLONOSCOPY WITH PROPOFOL;  Surgeon: Jeani Hawking, MD;  Location: [facility] ENDOSCOPY;  Service: Endoscopy;  Laterality: N/A;   CORONARY ANGIOGRAM  04/30/2011   Procedure: CORONARY ANGIOGRAM;  Surgeon: Runell Gess, MD;  Location: [facility] CATH LAB;  Service: Cardiovascular;;   ESOPHAGOGASTRODUODENOSCOPY  05/04/2011   Procedure: ESOPHAGOGASTRODUODENOSCOPY (EGD);  Surgeon: Theda Belfast, MD;  Location: [facility] ENDOSCOPY;  Service: Endoscopy;  Laterality: N/A;   EYE SURGERY     bilateral cataracts removed   HERNIA REPAIR  1999&2000   bilateral   LOBECTOMY  1980   left lower lobectomy for benign tumor   OPEN REDUCTION INTERNAL FIXATION (ORIF) DISTAL RADIAL FRACTURE Left 03/19/2017   Procedure: OPEN REDUCTION INTERNAL FIXATION (ORIF) DISTAL RADIAL FRACTURE;  Surgeon: Jodi Geralds, MD;  Location: [facility] OR;  Service: Orthopedics;  Laterality: Left;  AXILLARY BLOCK   POLYPECTOMY  06/20/2017   Procedure: POLYPECTOMY;  Surgeon: Jeani Hawking, MD;  Location: [facility] ENDOSCOPY;  Service: Endoscopy;;   REPLACEMENT TOTAL KNEE  2005   right   RIGHT HEART CATHETERIZATION  04/30/2011   Procedure: RIGHT HEART CATH;  Surgeon: Runell Gess, MD;  Location: [facility] CATH LAB;  Service: Cardiovascular;;   TEE WITHOUT CARDIOVERSION  04/30/2011   Procedure: TRANSESOPHAGEAL ECHOCARDIOGRAM (TEE);  Surgeon: Thurmon Fair, MD;  Location: [facility] ENDOSCOPY;  Service: Cardiovascular;  Laterality: N/A;  3D TEE/ patient will be admitted the day before test , therefo patient will be a inpatient the day of TEE   TOTAL KNEE ARTHROPLASTY Left 02/15/2012   Procedure: TOTAL KNEE ARTHROPLASTY;  Surgeon: Harvie Junior, MD;  Location: [facility] OR;  Service: Orthopedics;  Laterality: Left;    Family History  Problem Relation Age of Onset   Hypertension Mother    Osteoporosis Mother    Lung cancer Father    Irregular heart beat Brother    Heart  attack Maternal Grandfather    Cancer Paternal Grandfather     Social History:  reports that he quit smoking about 62 years ago. His smoking use included cigarettes. He started smoking about 82 years ago. He quit smokeless tobacco use about 62 years ago. He reports current alcohol use of about 7.0 standard drinks of alcohol per week. He reports that he does not use drugs.  Allergies:  Allergies  Allergen Reactions   Sodium Bicarbonate Other (See Comments)    Made the patient feel "unwell, not normal."    Medications: I have reviewed the patient's current medications.  Results for orders placed or performed during the hospital encounter of 04/04/23 (from the past 48 hours)  Comprehensive metabolic panel     Status: Abnormal   Collection Time: 04/04/23 12:07 PM  Result Value Ref Range   Sodium 138 135 - 145 mmol/L   Potassium 4.5 3.5 - 5.1 mmol/L   Chloride 110 98 - 111 mmol/L   CO2 19 (L) 22 - 32 mmol/L   Glucose, Bld 90 70 - 99 mg/dL    Comment: Glucose reference range applies only to samples taken after fasting for at least 8 hours.   BUN 79 (H) 8 - 23 mg/dL   Creatinine, Ser 4.09 (H) 0.61 - 1.24 mg/dL   Calcium 8.9 8.9 - 81.1 mg/dL   Total Protein 6.5 6.5 - 8.1 g/dL   Albumin 2.9 (L) 3.5 - 5.0 g/dL   AST 33 15 - 41 U/L   ALT 23 0 - 44 U/L   Alkaline Phosphatase 85 38 - 126 U/L   Total Bilirubin 0.4 0.0 - 1.2 mg/dL   GFR, Estimated 29 (L) >60 mL/min    Comment: (NOTE) Calculated using the CKD-EPI Creatinine Equation (2021)    Anion gap 9 5 - 15    Comment: Performed at [facility], [address], [place], [place] [address]  CBC with Differential     Status: Abnormal   Collection Time: 04/04/23 12:07 PM  Result Value Ref Range   WBC 9.1 4.0 - 10.5 K/uL   RBC 2.66 (L) 4.22 - 5.81 MIL/uL   Hemoglobin 8.0 (L) 13.0 - 17.0 g/dL   HCT 29.5 (L) 62.1 - 30.8 %   MCV 95.9 80.0 - 100.0 fL   MCH 30.1 26.0 - 34.0 pg   MCHC 31.4 30.0 - 36.0 g/dL   RDW 65.7 (H)  84.6 - 15.5 %   Platelets 147 (L) 150 - 400 K/uL   nRBC 0.4 (H) 0.0 - 0.2 %   Neutrophils Relative % 87 %   Neutro Abs 7.9 (H) 1.7 - 7.7 K/uL   Lymphocytes Relative 5 %   Lymphs Abs 0.5 (L) 0.7 - 4.0 K/uL   Monocytes Relative 5 %   Monocytes Absolute 0.5 0.1 - 1.0 K/uL   Eosinophils  Relative 1 %   Eosinophils Absolute 0.1 0.0 - 0.5 K/uL   Basophils Relative 0 %   Basophils Absolute 0.0 0.0 - 0.1 K/uL   Immature Granulocytes 2 %   Abs Immature Granulocytes 0.14 (H) 0.00 - 0.07 K/uL    Comment: Performed at [facility], [address], [place], [place] [address]  Urinalysis, w/ Reflex to Culture (Infection Suspected) -Urine, Clean Catch     Status: Abnormal   Collection Time: 04/04/23 12:07 PM  Result Value Ref Range   Specimen Source URINE, CLEAN CATCH    Color, Urine YELLOW YELLOW   APPearance CLEAR CLEAR   Specific Gravity, Urine 1.016 1.005 - 1.030   pH 5.0 5.0 - 8.0   Glucose, UA NEGATIVE NEGATIVE mg/dL   Hgb urine dipstick NEGATIVE NEGATIVE   Bilirubin Urine NEGATIVE NEGATIVE   Ketones, ur NEGATIVE NEGATIVE mg/dL   Protein, ur 30 (A) NEGATIVE mg/dL   Nitrite NEGATIVE NEGATIVE   Leukocytes,Ua TRACE (A) NEGATIVE   RBC / HPF 0-5 0 - 5 RBC/hpf   WBC, UA 0-5 0 - 5 WBC/hpf    Comment:        Reflex urine culture not performed if WBC <=10, OR if Squamous epithelial cells >5. If Squamous epithelial cells >5 suggest recollection.    Bacteria, UA NONE SEEN NONE SEEN   Squamous Epithelial / HPF 0-5 0 - 5 /HPF    Comment: Performed at [facility], [address], [place], [place] [address]  Protime-INR     Status: None   Collection Time: 04/04/23 12:07 PM  Result Value Ref Range   Prothrombin Time 14.2 11.4 - 15.2 seconds   INR 1.1 0.8 - 1.2    Comment: (NOTE) INR goal varies based on device and disease states. Performed at [facility], [address], [place], [place] [address]   Sedimentation rate     Status:  Abnormal   Collection Time: 04/04/23 12:07 PM  Result Value Ref Range   Sed Rate 70 (H) 0 - 16 mm/hr    Comment: Performed at [facility], [address] [place], [place] [address]  C-reactive protein     Status: Abnormal   Collection Time: 04/04/23 12:07 PM  Result Value Ref Range   CRP 13.3 (H) <1.0 mg/dL    Comment: Performed at [facility], [address] [place], [place] [address]  CK     Status: None   Collection Time: 04/04/23 12:07 PM  Result Value Ref Range   Total CK 49 49 - 397 U/L    Comment: Performed at [facility], [address] [place], [place] [address]  I-Stat Lactic Acid, ED     Status: None   Collection Time: 04/04/23 12:20 PM  Result Value Ref Range   Lactic Acid, Venous 1.1 0.5 - 1.9 mmol/L  Resp panel by RT-PCR (RSV, Flu A&B, Covid) Anterior Nasal Swab     Status: None   Collection Time: 04/04/23  2:28 PM   Specimen: Anterior Nasal Swab  Result Value Ref Range   SARS Coronavirus 2 by RT PCR NEGATIVE NEGATIVE    Comment: (NOTE) SARS-CoV-2 target nucleic acids are NOT DETECTED.  The SARS-CoV-2 RNA is generally detectable in upper respiratory specimens during the acute phase of infection. The lowest concentration of SARS-CoV-2 viral copies this assay can detect is 138 copies/mL. A negative result does not preclude SARS-Cov-2 infection and should not be used as  the sole basis for treatment or other patient management decisions. A negative result may occur with  improper specimen collection/handling, submission of specimen other than nasopharyngeal swab, presence of viral mutation(s) within the areas targeted by this assay, and inadequate number of viral copies(<138 copies/mL). A negative result must be combined with clinical observations, patient history, and epidemiological information. The expected result is Negative.  Fact Sheet for Patients:  BloggerCourse.com  Fact Sheet for  Healthcare Providers:  SeriousBroker.it  This test is no t yet approved or cleared by the [place] FDA and  has been authorized for detection and/or diagnosis of SARS-CoV-2 by FDA under an Emergency Use Authorization (EUA). This EUA will remain  in effect (meaning this test can be used) for the duration of the COVID-19 declaration under Section 564(b)(1) of the Act, 21 U.S.C.section 360bbb-3(b)(1), unless the authorization is terminated  or revoked sooner.       Influenza A by PCR NEGATIVE NEGATIVE   Influenza B by PCR NEGATIVE NEGATIVE    Comment: (NOTE) The Xpert Xpress SARS-CoV-2/FLU/RSV plus assay is intended as an aid in the diagnosis of influenza from Nasopharyngeal swab specimens and should not be used as a sole basis for treatment. Nasal washings and aspirates are unacceptable for Xpert Xpress SARS-CoV-2/FLU/RSV testing.  Fact Sheet for Patients: BloggerCourse.com  Fact Sheet for Healthcare Providers: SeriousBroker.it  This test is not yet approved or cleared by the [place] FDA and has been authorized for detection and/or diagnosis of SARS-CoV-2 by FDA under an Emergency Use Authorization (EUA). This EUA will remain in effect (meaning this test can be used) for the duration of the COVID-19 declaration under Section 564(b)(1) of the Act, 21 U.S.C. section 360bbb-3(b)(1), unless the authorization is terminated or revoked.     Resp Syncytial Virus by PCR NEGATIVE NEGATIVE    Comment: (NOTE) Fact Sheet for Patients: BloggerCourse.com  Fact Sheet for Healthcare Providers: SeriousBroker.it  This test is not yet approved or cleared by the [place] FDA and has been authorized for detection and/or diagnosis of SARS-CoV-2 by FDA under an Emergency Use Authorization (EUA). This EUA will remain in effect (meaning this test can be used)  for the duration of the COVID-19 declaration under Section 564(b)(1) of the Act, 21 U.S.C. section 360bbb-3(b)(1), unless the authorization is terminated or revoked.  Performed at [facility], [address] [place], [place] [address]   Blood Culture (routine x 2)     Status: None (Preliminary result)   Collection Time: 04/04/23  2:40 PM   Specimen: BLOOD RIGHT FOREARM  Result Value Ref Range   Specimen Description      BLOOD RIGHT FOREARM Performed at [facility], [address] [place], [place] [address]    Special Requests      BOTTLES DRAWN AEROBIC AND ANAEROBIC Blood Culture adequate volume Performed at [facility], [address], [place], [place] [address]    Culture      NO GROWTH < 24 HOURS Performed at [facility], [address] [place], [place] [address]    Report Status PENDING   I-Stat Lactic Acid, ED     Status: None   Collection Time: 04/04/23  2:53 PM  Result Value Ref Range   Lactic Acid, Venous 0.7 0.5 - 1.9 mmol/L  Blood Culture (routine x 2)     Status: None (Preliminary result)   Collection Time: 04/04/23  3:23 PM   Specimen: BLOOD LEFT ARM  Result  Value Ref Range   Specimen Description      BLOOD LEFT ARM Performed at [facility], [address], [place], [place] [address]    Special Requests      BOTTLES DRAWN AEROBIC AND ANAEROBIC Blood Culture results may not be optimal due to an inadequate volume of blood received in culture bottles Performed at [facility], [address], [place], [place] [address]    Culture      NO GROWTH < 24 HOURS Performed at [facility], [address], [place], [place] [address]    Report Status PENDING     MR [facility] RIGHT WO CONTRAST Result Date: 04/04/2023 CLINICAL DATA:  Right leg cellulitis. Swelling and pain. Concern for necrotizing fasciitis. EXAM: MRI OF THE RIGHT FEMUR WITHOUT CONTRAST TECHNIQUE: Multiplanar,  multisequence MR imaging of the right femur was performed. No intravenous contrast was administered. COMPARISON:  None Available. FINDINGS: Bones/Joint/Cartilage No fracture or dislocation. Right knee arthroplasty with associated susceptibility artifact. Otherwise, no discrete marrow signal abnormality. Soft tissue/Muscles Subcutaneous edema and overlying cutaneous thickening extending along the right lateral hip and circumferential thigh to the level of the knee. Edema extends to the superficial fascia of the anterior and posterior compartment musculature of the thigh. There is increased abnormal T2/STIR hyperintense signal extending from the superficial fascia of the right quadriceps musculature into deeper intramuscular fascial planes, most notably along the vastus intermedius, vastus lateralis, and rectus femoris deep intramuscular fascial planes. Similar changes are noted involving the posterior compartment hamstring musculature, with pronounced fluid extending along the biceps femoris, semitendinosus, and semimembranosus deep intramuscular fascial planes. No loculated collection identified. There is associated intramuscular edema. IMPRESSION: 1. Findings concerning for necrotizing fasciitis of the right thigh with abnormal irregular fluid signal extending throughout the superficial to deep intramuscular fascial planes of the right quadriceps and hamstrings musculature. No loculated collection identified. There is associated intramuscular edema. 2. Diffuse subcutaneous edema and cutaneous thickening of the right thigh, compatible with cellulitis. Electronically Signed   By: Hart Robinsons M.D.   On: 04/04/2023 19:24   DG Chest Port 1 View Result Date: 04/04/2023 CLINICAL DATA:  Questionable sepsis EXAM: PORTABLE CHEST 1 VIEW COMPARISON:  Chest x-ray 02/11/2017 FINDINGS: The heart is enlarged. Surgical clips overlie the left hilum, unchanged. There some minimal scarring or atelectasis in the left  costophrenic angle. There is no pleural effusion or pneumothorax. No acute fractures are seen. IMPRESSION: 1. Cardiomegaly. 2. Minimal scarring or atelectasis in the left costophrenic angle. Electronically Signed   By: Darliss Cheney M.D.   On: 04/04/2023 15:22    Review of Systems  Constitutional:  Negative for chills, diaphoresis and fever.  HENT:  Negative for ear discharge, ear pain, hearing loss and tinnitus.   Eyes:  Negative for photophobia and pain.  Respiratory:  Negative for cough and shortness of breath.   Cardiovascular:  Negative for chest pain.  Gastrointestinal:  Negative for abdominal pain, nausea and vomiting.  Genitourinary:  Negative for dysuria, flank pain, frequency and urgency.  Musculoskeletal:  Positive for myalgias (RLE). Negative for back pain and neck pain.  Neurological:  Negative for dizziness and headaches.  Hematological:  Does not bruise/bleed easily.  Psychiatric/Behavioral:  The patient is not nervous/anxious.    Blood pressure (!) 114/53, pulse 62, temperature (!) 97.3 F (36.3 C), temperature source Oral, resp. rate 16, height 6' (1.829 m), weight 80.2 kg, SpO2 96%. Physical Exam Constitutional:      General: He  is not in acute distress.    Appearance: He is well-developed. He is not diaphoretic.  HENT:     Head: Normocephalic and atraumatic.  Eyes:     General: No scleral icterus.       Right eye: No discharge.        Left eye: No discharge.     Conjunctiva/sclera: Conjunctivae normal.  Cardiovascular:     Rate and Rhythm: Normal rate and regular rhythm.  Pulmonary:     Effort: Pulmonary effort is normal. No respiratory distress.  Musculoskeletal:     Cervical back: Normal range of motion.     Comments: RLE No traumatic wounds or ecchymosis. Scattered erythematous rash predominantly lower leg.  Mod TTP calf and thigh, some induration noted lateral calf and thigh. No SQE or bullae formation. No pain with passive hip/knee/ankle motion.  No knee  or ankle effusion  Sens SPN, TN intact, DPN paresthetic (thinks normal for him)  Motor EHL, ext, flex, evers 5/5  DP 1+, PT 0, No significant edema  Skin:    General: Skin is warm and dry.  Neurological:     Mental Status: He is alert.  Psychiatric:        Mood and Affect: Mood normal.        Behavior: Behavior normal.     Assessment/Plan: RLE cellulitis -- No clinical s/sx of nec fasc. Advise continue IV abx. Will follow along. May need repeat imaging if fails to improve over next 48h.    Freeman Caldron, PA-C Orthopedic Surgery [phone] 04/05/2023, 9:32 AM

## 2023-04-05 NOTE — Consult Note (Addendum)
 [facility]    Date of Admission:  04/04/2023     Reason for Consult: cellulitis    Referring Provider: Renford Dills     Lines:  Peripheral iv's  Abx: 4/3-c linezolid  4/3-c piptazo  3/31-4/3 cephalexin; 4/3 ?bactrim   Assessment: 89 yo active male without diabetes melliutus, with htn/hlp, chronic bilateral lower venous stasis admitted for about 10 days worsening rle cellulitis despite cephalexin of 4 days   Exam consistent with chronic skin changes bilateral venous stasis and a few blisters bilaterally of various stage without cold area; rle cellulitis tracking to thigh  Clinically stable improving  Bcx ngtd No leukocytosis on admission  Hb is rather low however at 6.9 Lft not elevated Platelet 116 likely sepsis related  Clinically he feels well other than rle related sx   While he has cellulitis change up to thigh this is all a gram positive process presentation based on exam   Plan: Change abx to linezolid; oral route is just as good as iv for this medication Anticipate 14 more days treatment required given the venous stasis Maintain standard isolation precaution New id team to take over Monday I anticipate if continued improvement by Monday can discharge Please call id team on call this weekend (dr Drue Second if any urgent concern about ID stuffs) Discussed with primary team      ------------------------------------------------ Principal Problem:   Cellulitis, leg Active Problems:   Necrotizing fasciitis (HCC)    HPI: [name] is a 89 y.o. male active male with htn/hlp, chronic bilateral lower venous stasis admitted for about 10 days worsening rle cellulitis despite cephalexin of 4 days   Patient has been having about 10 days of rle pain/redness/swelling  He was given cephalexin on 3/31 for sx but no improvement  Abx was changed to bactrim but not sure if he is taking it yet (said 2 days on h&p although  timing is off)  No fever, chil He feels well otherwise  On presentation afebrile No leukocytosis Lactate level normal Mri does show fluid signal in the thighs He is on linezolid/piptazo  No n/v/d  Sx stable/improving     Family History  Problem Relation Age of Onset   Hypertension Mother    Osteoporosis Mother    Lung cancer Father    Irregular heart beat Brother    Heart attack Maternal Grandfather    Cancer Paternal Grandfather     Social History   Tobacco Use   Smoking status: Former    Current packs/day: 0.00    Types: Cigarettes    Start date: 02/08/1941    Quit date: 02/08/1961    Years since quitting: 62.1   Smokeless tobacco: Former    Quit date: 02/08/1961  Vaping Use   Vaping status: Never Used  Substance Use Topics   Alcohol use: Yes    Alcohol/week: 7.0 standard drinks of alcohol    Types: 7 Standard drinks or equivalent per week    Comment: 2ozs Financial controller daily   Drug use: No    Allergies  Allergen Reactions   Sodium Bicarbonate Other (See Comments)    Made the patient feel "unwell, not normal."    Review of Systems: ROS All Other ROS was negative, except mentioned above   Past Medical History:  Diagnosis Date   Anemia    low hemoglobin   Arthritis    AV (angiodysplasia malformation of colon)    Benign prostatic  hypertrophy    Cancer (HCC)    basal cell cancer   Chronic kidney disease    elevated Cr, BUN, Dr. Caryn Section manages   COPD (chronic obstructive pulmonary disease) [facility])    Coronary artery disease    GERD (gastroesophageal reflux disease)    Gout    Heart murmur    History of kidney stones    Hyperlipidemia    Hypertension    Influenza    February 2019   Mitral regurgitation    Mitral valve prolapse    Pneumonia 1970's   PONV (postoperative nausea and vomiting)    1st knee replacement   Shortness of breath        Scheduled Meds:  sodium chloride   Intravenous Once   atorvastatin  10 mg Oral q1800   Continuous  Infusions:  ampicillin-sulbactam (UNASYN) IV     lactated ringers     linezolid (ZYVOX) IV 600 mg (04/05/23 0423)   PRN Meds:.acetaminophen, albuterol, HYDROmorphone (DILAUDID) injection, ondansetron **OR** ondansetron (ZOFRAN) IV, oxyCODONE, polyethylene glycol, traZODone   OBJECTIVE: Blood pressure (!) 114/53, pulse 62, temperature (!) 97.3 F (36.3 C), temperature source Oral, resp. rate 16, height 6' (1.829 m), weight 80.2 kg, SpO2 96%.  Physical Exam General/constitutional: no distress, pleasant HEENT: Normocephalic, PER, Conj Clear, EOMI, Oropharynx clear Neck supple CV: rrr no mrg Lungs: clear to auscultation, normal respiratory effort Abd: Soft, Nontender Ext: no edema Skin: chronic dusky changes and brawny skin changes bilaterally; some old blisters anterior shin area bilatearlly; nontender left lower ext; right lower erythema mildly and tender on hard palpation; swelling present in thigh but minimal tenderness; good dp pulse bilaterally and warm bilateral foot Neuro: nonfocal MSK: no peripheral joint swelling/tenderness/warmth; back spines nontender     Lab Results Lab Results  Component Value Date   WBC 6.4 04/05/2023   HGB 6.9 (LL) 04/05/2023   HCT 22.1 (L) 04/05/2023   MCV 95.3 04/05/2023   PLT 116 (L) 04/05/2023    Lab Results  Component Value Date   CREATININE 1.91 (H) 04/05/2023   BUN 63 (H) 04/05/2023   NA 137 04/05/2023   K 4.7 04/05/2023   CL 109 04/05/2023   CO2 19 (L) 04/05/2023    Lab Results  Component Value Date   ALT 23 04/04/2023   AST 33 04/04/2023   ALKPHOS 85 04/04/2023   BILITOT 0.4 04/04/2023      Microbiology: Recent Results (from the past 240 hours)  Resp panel by RT-PCR (RSV, Flu A&B, Covid) Anterior Nasal Swab     Status: None   Collection Time: 04/04/23  2:28 PM   Specimen: Anterior Nasal Swab  Result Value Ref Range Status   SARS Coronavirus 2 by RT PCR NEGATIVE NEGATIVE Final    Comment: (NOTE) SARS-CoV-2 target  nucleic acids are NOT DETECTED.  The SARS-CoV-2 RNA is generally detectable in upper respiratory specimens during the acute phase of infection. The lowest concentration of SARS-CoV-2 viral copies this assay can detect is 138 copies/mL. A negative result does not preclude SARS-Cov-2 infection and should not be used as the sole basis for treatment or other patient management decisions. A negative result may occur with  improper specimen collection/handling, submission of specimen other than nasopharyngeal swab, presence of viral mutation(s) within the areas targeted by this assay, and inadequate number of viral copies(<138 copies/mL). A negative result must be combined with clinical observations, patient history, and epidemiological information. The expected result is Negative.  Fact Sheet for Patients:  BloggerCourse.com  Fact Sheet for Healthcare Providers:  SeriousBroker.it  This test is no t yet approved or cleared by the [place] FDA and  has been authorized for detection and/or diagnosis of SARS-CoV-2 by FDA under an Emergency Use Authorization (EUA). This EUA will remain  in effect (meaning this test can be used) for the duration of the COVID-19 declaration under Section 564(b)(1) of the Act, 21 U.S.C.section 360bbb-3(b)(1), unless the authorization is terminated  or revoked sooner.       Influenza A by PCR NEGATIVE NEGATIVE Final   Influenza B by PCR NEGATIVE NEGATIVE Final    Comment: (NOTE) The Xpert Xpress SARS-CoV-2/FLU/RSV plus assay is intended as an aid in the diagnosis of influenza from Nasopharyngeal swab specimens and should not be used as a sole basis for treatment. Nasal washings and aspirates are unacceptable for Xpert Xpress SARS-CoV-2/FLU/RSV testing.  Fact Sheet for Patients: BloggerCourse.com  Fact Sheet for Healthcare  Providers: SeriousBroker.it  This test is not yet approved or cleared by the [place] FDA and has been authorized for detection and/or diagnosis of SARS-CoV-2 by FDA under an Emergency Use Authorization (EUA). This EUA will remain in effect (meaning this test can be used) for the duration of the COVID-19 declaration under Section 564(b)(1) of the Act, 21 U.S.C. section 360bbb-3(b)(1), unless the authorization is terminated or revoked.     Resp Syncytial Virus by PCR NEGATIVE NEGATIVE Final    Comment: (NOTE) Fact Sheet for Patients: BloggerCourse.com  Fact Sheet for Healthcare Providers: SeriousBroker.it  This test is not yet approved or cleared by the [place] FDA and has been authorized for detection and/or diagnosis of SARS-CoV-2 by FDA under an Emergency Use Authorization (EUA). This EUA will remain in effect (meaning this test can be used) for the duration of the COVID-19 declaration under Section 564(b)(1) of the Act, 21 U.S.C. section 360bbb-3(b)(1), unless the authorization is terminated or revoked.  Performed at [facility], [address], [place], [place] [address]   Blood Culture (routine x 2)     Status: None (Preliminary result)   Collection Time: 04/04/23  2:40 PM   Specimen: BLOOD RIGHT FOREARM  Result Value Ref Range Status   Specimen Description   Final    BLOOD RIGHT FOREARM Performed at [facility], [address], [place], [place] [address]    Special Requests   Final    BOTTLES DRAWN AEROBIC AND ANAEROBIC Blood Culture adequate volume Performed at [facility], [address], [place], [place] [address]    Culture   Final    NO GROWTH < 24 HOURS Performed at [facility], [address] [place], [place] [address]    Report Status PENDING  Incomplete  Blood Culture (routine x 2)     Status: None (Preliminary  result)   Collection Time: 04/04/23  3:23 PM   Specimen: BLOOD LEFT ARM  Result Value Ref Range Status   Specimen Description   Final    BLOOD LEFT ARM Performed at [facility], [address] [place], [place] [address]    Special Requests   Final    BOTTLES DRAWN AEROBIC AND ANAEROBIC Blood Culture results may not be optimal due to an inadequate volume of blood received in culture bottles Performed at [facility], [address] [place], [place] [address]    Culture   Final    NO GROWTH < 24 HOURS Performed at [facility],  [address] [place], [place] [address]    Report Status PENDING  Incomplete     Serology:    Imaging: If present, new imagings (plain films, ct scans, and mri) have been personally visualized and interpreted; radiology reports have been reviewed. Decision making incorporated into the Impression / Recommendations.  4/3 mri 1. Findings concerning for necrotizing fasciitis of the right thigh with abnormal irregular fluid signal extending throughout the superficial to deep intramuscular fascial planes of the right quadriceps and hamstrings musculature. No loculated collection identified. There is associated intramuscular edema. 2. Diffuse subcutaneous edema and cutaneous thickening of the right thigh, compatible with cellulitis.  Raymondo Band, MD [facility] [phone] pager    04/05/2023, 1:15 PM

## 2023-04-06 DIAGNOSIS — L03115 Cellulitis of right lower limb: Secondary | ICD-10-CM | POA: Diagnosis not present

## 2023-04-06 LAB — BASIC METABOLIC PANEL WITH GFR
Anion gap: 10 (ref 5–15)
BUN: 57 mg/dL — ABNORMAL HIGH (ref 8–23)
CO2: 18 mmol/L — ABNORMAL LOW (ref 22–32)
Calcium: 8.7 mg/dL — ABNORMAL LOW (ref 8.9–10.3)
Chloride: 109 mmol/L (ref 98–111)
Creatinine, Ser: 1.99 mg/dL — ABNORMAL HIGH (ref 0.61–1.24)
GFR, Estimated: 30 mL/min — ABNORMAL LOW (ref >60)
Glucose, Bld: 87 mg/dL (ref 70–99)
Potassium: 4.9 mmol/L (ref 3.5–5.1)
Sodium: 137 mmol/L (ref 135–145)

## 2023-04-06 LAB — BPAM RBC
Blood Product Expiration Date: 202505092359
ISSUE DATE / TIME: 202504041308
Unit Type and Rh: 9500

## 2023-04-06 LAB — TYPE AND SCREEN
ABO/RH(D): O NEG
Antibody Screen: NEGATIVE
Unit division: 0

## 2023-04-06 LAB — CBC
HCT: 25.1 % — ABNORMAL LOW (ref 39.0–52.0)
Hemoglobin: 7.9 g/dL — ABNORMAL LOW (ref 13.0–17.0)
MCH: 29.5 pg (ref 26.0–34.0)
MCHC: 31.5 g/dL (ref 30.0–36.0)
MCV: 93.7 fL (ref 80.0–100.0)
Platelets: 135 10*3/uL — ABNORMAL LOW (ref 150–400)
RBC: 2.68 MIL/uL — ABNORMAL LOW (ref 4.22–5.81)
RDW: 20.2 % — ABNORMAL HIGH (ref 11.5–15.5)
WBC: 6.2 10*3/uL (ref 4.0–10.5)
nRBC: 0.3 % — ABNORMAL HIGH (ref 0.0–0.2)

## 2023-04-06 LAB — LACTIC ACID, PLASMA
Lactic Acid, Venous: 1.1 mmol/L (ref 0.5–1.9)
Lactic Acid, Venous: 1.5 mmol/L (ref 0.5–1.9)

## 2023-04-06 NOTE — Progress Notes (Addendum)
        Subjective:  Patient reports pain as mild, smiles and laughs throughout interaction.  Slept okay.  Does not believe his infection has worsened.  Able to move the leg around without significant pain.  No overnight events.  Denies chest pain, ShOB, N/V.  Denies numbness or tingling.  No other complaints at this time.  Objective:   VITALS:   Vitals:   04/06/23 0626 04/06/23 0708 04/06/23 0710 04/06/23 0806  BP:  (!) 141/62    Pulse:  63    Resp:  (!) 21    Temp: (!) 95.8 F (35.4 C) (!) 95.9 F (35.5 C) (!) 96 F (35.6 C) (!) 96.2 F (35.7 C)  TempSrc: Rectal Rectal Rectal Rectal  SpO2:  96%    Weight:      Height:        See photos for sacral wound and for erythematous appearance of right leg  LLE Some tenderness along erythematous skin  No knee or ankle effusion  Tolerates gentle hip ROM without significant pain Sens DPN, SPN, TN intact  Motor EHL, ext, flex 5/5  Decent perfusion of foot, CRT < 2, 2+ PT pulse            Lab Results  Component Value Date   WBC 6.2 04/06/2023   HGB 7.9 (L) 04/06/2023   HCT 25.1 (L) 04/06/2023   MCV 93.7 04/06/2023   PLT 135 (L) 04/06/2023   BMET    Component Value Date/Time   NA 137 04/06/2023 0535   K 4.9 04/06/2023 0535   CL 109 04/06/2023 0535   CO2 18 (L) 04/06/2023 0535   GLUCOSE 87 04/06/2023 0535   BUN 57 (H) 04/06/2023 0535   CREATININE 1.99 (H) 04/06/2023 0535   CREATININE 1.86 (H) 08/17/2020 1119   CALCIUM 8.7 (L) 04/06/2023 0535   GFRNONAA 30 (L) 04/06/2023 0535   GFRNONAA 33 (L) 08/17/2020 1119     Assessment/Plan:     Principal Problem:   Cellulitis, leg Active Problems:   Necrotizing fasciitis (HCC)     Narrative/Plan: 04/06/23: Patient hemodynamically stable.  Difficult to discern worsening/improvement based off singular interaction with patient.  However labs and clinical examination reassuring against necrotizing fasciitis.  No leukocytosis or hyperkalemia.  No fever,  tachycardia, or hypotension.  Area not overtly tender.  Believe sacral pressure sore would benefit from wound care consult.  Recommend continuing antibiotic treatment of cellulitis, defer MRI / debridement for time being.  Can continue to follow as needed.   Weightbearing: WBAT ROM/Precautions:  Standard isolation precautions, no restrictions on ROM Pain management: multimodal  Antibiotics: Per medicine/ID, currently on linezolid and augmentin Diet: Advance as tolerated VTE prophylaxis: Not candidate Dispo: per medicine/ID, likely Monday if stable/improving Follow - up plan: further info to follow    Cecil Cobbs 04/06/2023, 8:15 AM   Contact information:   Weekdays 7am-5pm epic message Dr. Blanchie Dessert, or call office   After hours and holidays please check [email or web address] for group call information for [facility]

## 2023-04-06 NOTE — Progress Notes (Signed)
 Patient's Temp initially was 95.3 rectal. Alert oriented. Tried to put warm blankets on him and right now latest temp is 95.8. On call provider made aware. Charge and Rapid nurse was informed and see the patient at bedside. Additional warm blankets was provided. Temp rechecked rectally 95.9. Rapid Nurse on day shift was made aware too of this patient.

## 2023-04-06 NOTE — Progress Notes (Signed)
 PROGRESS NOTE  [name]  ION:[id number] DOB: 1925/05/21 DOA: 04/04/2023 PCP: Georgianne Fick, MD   Brief Narrative: Patient is a 89 year old male with history of hypertension, hyperlipidemia , chronic iron deficiency anemia who presented with complaint of  pain, tenderness, erythema of right lower leg.  He was treated for cellulitis with outpatient antibiotics but patient failed to improve.  Presented with increased pain radiating up to right calf/inner thigh.  Pain on ambulation.  No fever or chills.  On presentation, he was hemodynamically stable without leukocytosis or lactic acidosis.  MRI showed features of necrotizing fasciitis of the right lower extremity.  Orthopedics consulted.  Plan for conservative management for now.  ID consulted, started on linezolid.  Clinically improving.  Assessment & Plan:  Principal Problem:   Cellulitis, leg Active Problems:   Necrotizing fasciitis (HCC)  Necrotizing fasciitis of the right lower extremity: Presented with pain, erythema of the right lower extremity.  Was treated for cellulitis as an outpatient but did not respond.  MRI done. Findings concerning for necrotizing fasciitis of the right thigh ,no loculated collection, showed associated intramuscular edema,diffuse subcutaneous edema and cutaneous thickening of the right thigh, compatible with cellulitis. Orthopedics already following.   Initial plan was for debridement but currently on hold.  ID consulted.  He is clinically improving with antibiotics.  Currently on linezolid.  Plan for 14 days course Blood cultures have not shown any growth.  Currently afebrile, no leukocytosis  Hypothermia: Unclear etiology.  Improved with warmed IV fluid.  Lactate level normal  AKI in CKD stage IIIb: Looks like his baseline creatinine around 1.6.  Presented with creatinine in the range of 2.  Likely prerenal AKI from dehydration, poor oral intake, relative hypotension.  Patient was also taking  Bactrim, lisinopril as an outpatient.  Continue gentle IV fluids for today.  Monitor kidney function.  Improving  Chronic normocytic/iron deficiency anemia: Chronic normocytic anemia, iron-deficiency.  Baseline hemoglobin around 8.  Given unit of blood transfusion on 4/4.  Currently hemoglobin at baseline.  No evidence of acute blood loss  Hypertension: Blood pressure was soft on presentation.  Home Cardizem, lisinopril on hold.  Hyperlipidemia: On Lipitor         Pressure Injury 04/04/23 Perineum Left;Right Stage 2 -  Partial thickness loss of dermis presenting as a shallow open injury with a red, pink wound bed without slough. (Active)  04/04/23 2030  Location: Perineum  Location Orientation: Left;Right  Staging: Stage 2 -  Partial thickness loss of dermis presenting as a shallow open injury with a red, pink wound bed without slough.  Wound Description (Comments):   Present on Admission: Yes  Dressing Type Foam - Lift dressing to assess site every shift 04/05/23 0940    DVT prophylaxis:SCDs Start: 04/04/23 2026     Code Status: Do not attempt resuscitation (DNR) PRE-ARREST INTERVENTIONS DESIRED  Family Communication: Discussed with wife at bedside on 4/5  Patient status:Inpatient  Patient is from :Home  Anticipated discharge KG:[facility]  Estimated DC date:1-2 days   Consultants: Orthopedics,ID  Procedures:None  Antimicrobials:  Anti-infectives (From admission, onward)    Start     Dose/Rate Route Frequency Ordered Stop   04/05/23 1600  linezolid (ZYVOX) tablet 600 mg        600 mg Oral Every 12 hours 04/05/23 1330     04/05/23 1400  Ampicillin-Sulbactam (UNASYN) 3 g in sodium chloride 0.9 % 100 mL IVPB  Status:  Discontinued        3  g 200 mL/hr over 30 Minutes Intravenous Every 12 hours 04/05/23 1039 04/05/23 1326   04/05/23 0400  linezolid (ZYVOX) IVPB 600 mg  Status:  Discontinued        600 mg 300 mL/hr over 60 Minutes Intravenous Every 12 hours 04/04/23 2022  04/05/23 1329   04/04/23 2200  piperacillin-tazobactam (ZOSYN) IVPB 3.375 g  Status:  Discontinued        3.375 g 12.5 mL/hr over 240 Minutes Intravenous Every 8 hours 04/04/23 2025 04/05/23 1039   04/04/23 1430  piperacillin-tazobactam (ZOSYN) IVPB 3.375 g        3.375 g 100 mL/hr over 30 Minutes Intravenous  Once 04/04/23 1415 04/04/23 1539   04/04/23 1430  linezolid (ZYVOX) IVPB 600 mg        600 mg 300 mL/hr over 60 Minutes Intravenous  Once 04/04/23 1415 04/04/23 1902       Subjective: Patient seen and examined at the bedside today.  Hemodynamically stable.  Overall comfortable.  Eating his breakfast.  Not in any acute distress.  Denies any significant leg pain.  Right leg appears better.  Temperature was around 95 this morning, given some warmed IV fluid  Objective: Vitals:   04/06/23 0806 04/06/23 0900 04/06/23 1017 04/06/23 1100  BP:  (!) 132/52    Pulse:      Resp:  20    Temp: (!) 96.2 F (35.7 C) (!) 96.8 F (36 C) (!) 97.5 F (36.4 C) (!) 97.5 F (36.4 C)  TempSrc: Rectal Rectal Oral Rectal  SpO2:  95%    Weight:      Height:        Intake/Output Summary (Last 24 hours) at 04/06/2023 1105 Last data filed at 04/06/2023 1100 Gross per 24 hour  Intake 2158.69 ml  Output 675 ml  Net 1483.69 ml   Filed Weights   04/04/23 1156  Weight: 80.2 kg    Examination:  General exam: Overall comfortable, not in distress, pleasant elderly male HEENT: PERRL Respiratory system:  no wheezes or crackles  Cardiovascular system: S1 & S2 heard, RRR.  Gastrointestinal system: Abdomen is nondistended, soft and nontender. Central nervous system: Alert and oriented Extremities: No edema, no clubbing ,no cyanosis Skin: Erythematous discoloration of bilateral legs, scattered bruising, shallow ulcer on the proximal part of the right leg   Data Reviewed: I have personally reviewed following labs and imaging studies  CBC: Recent Labs  Lab 04/04/23 1207 04/05/23 0839  04/05/23 1855 04/06/23 0535  WBC 9.1 6.4  --  6.2  NEUTROABS 7.9*  --   --   --   HGB 8.0* 6.9* 8.2* 7.9*  HCT 25.5* 22.1* 26.8* 25.1*  MCV 95.9 95.3  --  93.7  PLT 147* 116*  --  135*   Basic Metabolic Panel: Recent Labs  Lab 04/04/23 1207 04/05/23 0839 04/06/23 0535  NA 138 137 137  K 4.5 4.7 4.9  CL 110 109 109  CO2 19* 19* 18*  GLUCOSE 90 99 87  BUN 79* 63* 57*  CREATININE 2.09* 1.91* 1.99*  CALCIUM 8.9 8.4* 8.7*     Recent Results (from the past 240 hours)  Resp panel by RT-PCR (RSV, Flu A&B, Covid) Anterior Nasal Swab     Status: None   Collection Time: 04/04/23  2:28 PM   Specimen: Anterior Nasal Swab  Result Value Ref Range Status   SARS Coronavirus 2 by RT PCR NEGATIVE NEGATIVE Final    Comment: (NOTE) SARS-CoV-2 target nucleic acids are NOT  DETECTED.  The SARS-CoV-2 RNA is generally detectable in upper respiratory specimens during the acute phase of infection. The lowest concentration of SARS-CoV-2 viral copies this assay can detect is 138 copies/mL. A negative result does not preclude SARS-Cov-2 infection and should not be used as the sole basis for treatment or other patient management decisions. A negative result may occur with  improper specimen collection/handling, submission of specimen other than nasopharyngeal swab, presence of viral mutation(s) within the areas targeted by this assay, and inadequate number of viral copies(<138 copies/mL). A negative result must be combined with clinical observations, patient history, and epidemiological information. The expected result is Negative.  Fact Sheet for Patients:  BloggerCourse.com  Fact Sheet for Healthcare Providers:  SeriousBroker.it  This test is no t yet approved or cleared by the [place] FDA and  has been authorized for detection and/or diagnosis of SARS-CoV-2 by FDA under an Emergency Use Authorization (EUA). This EUA will remain  in  effect (meaning this test can be used) for the duration of the COVID-19 declaration under Section 564(b)(1) of the Act, 21 U.S.C.section 360bbb-3(b)(1), unless the authorization is terminated  or revoked sooner.       Influenza A by PCR NEGATIVE NEGATIVE Final   Influenza B by PCR NEGATIVE NEGATIVE Final    Comment: (NOTE) The Xpert Xpress SARS-CoV-2/FLU/RSV plus assay is intended as an aid in the diagnosis of influenza from Nasopharyngeal swab specimens and should not be used as a sole basis for treatment. Nasal washings and aspirates are unacceptable for Xpert Xpress SARS-CoV-2/FLU/RSV testing.  Fact Sheet for Patients: BloggerCourse.com  Fact Sheet for Healthcare Providers: SeriousBroker.it  This test is not yet approved or cleared by the [place] FDA and has been authorized for detection and/or diagnosis of SARS-CoV-2 by FDA under an Emergency Use Authorization (EUA). This EUA will remain in effect (meaning this test can be used) for the duration of the COVID-19 declaration under Section 564(b)(1) of the Act, 21 U.S.C. section 360bbb-3(b)(1), unless the authorization is terminated or revoked.     Resp Syncytial Virus by PCR NEGATIVE NEGATIVE Final    Comment: (NOTE) Fact Sheet for Patients: BloggerCourse.com  Fact Sheet for Healthcare Providers: SeriousBroker.it  This test is not yet approved or cleared by the [place] FDA and has been authorized for detection and/or diagnosis of SARS-CoV-2 by FDA under an Emergency Use Authorization (EUA). This EUA will remain in effect (meaning this test can be used) for the duration of the COVID-19 declaration under Section 564(b)(1) of the Act, 21 U.S.C. section 360bbb-3(b)(1), unless the authorization is terminated or revoked.  Performed at [facility], [address] [place], [place] [address]    Blood Culture (routine x 2)     Status: None (Preliminary result)   Collection Time: 04/04/23  2:40 PM   Specimen: BLOOD RIGHT FOREARM  Result Value Ref Range Status   Specimen Description   Final    BLOOD RIGHT FOREARM Performed at [facility], [address], [place], [place] [address]    Special Requests   Final    BOTTLES DRAWN AEROBIC AND ANAEROBIC Blood Culture adequate volume Performed at [facility], [address] [place], [place] [address]    Culture   Final    NO GROWTH 2 DAYS Performed at [facility], [address] [place], [place] [address]    Report Status PENDING  Incomplete  Blood Culture (routine x 2)     Status:  None (Preliminary result)   Collection Time: 04/04/23  3:23 PM   Specimen: BLOOD LEFT ARM  Result Value Ref Range Status   Specimen Description   Final    BLOOD LEFT ARM Performed at [facility], [address], [place], [place] [address]    Special Requests   Final    BOTTLES DRAWN AEROBIC AND ANAEROBIC Blood Culture results may not be optimal due to an inadequate volume of blood received in culture bottles Performed at [facility], [address], [place], [place] [address]    Culture   Final    NO GROWTH 2 DAYS Performed at [facility], [address] [place], [place] [address]    Report Status PENDING  Incomplete     Radiology Studies: MR [facility] RIGHT WO CONTRAST Result Date: 04/04/2023 CLINICAL DATA:  Right leg cellulitis. Swelling and pain. Concern for necrotizing fasciitis. EXAM: MRI OF THE RIGHT FEMUR WITHOUT CONTRAST TECHNIQUE: Multiplanar, multisequence MR imaging of the right femur was performed. No intravenous contrast was administered. COMPARISON:  None Available. FINDINGS: Bones/Joint/Cartilage No fracture or dislocation. Right knee arthroplasty with associated susceptibility artifact. Otherwise, no discrete marrow signal abnormality. Soft tissue/Muscles Subcutaneous  edema and overlying cutaneous thickening extending along the right lateral hip and circumferential thigh to the level of the knee. Edema extends to the superficial fascia of the anterior and posterior compartment musculature of the thigh. There is increased abnormal T2/STIR hyperintense signal extending from the superficial fascia of the right quadriceps musculature into deeper intramuscular fascial planes, most notably along the vastus intermedius, vastus lateralis, and rectus femoris deep intramuscular fascial planes. Similar changes are noted involving the posterior compartment hamstring musculature, with pronounced fluid extending along the biceps femoris, semitendinosus, and semimembranosus deep intramuscular fascial planes. No loculated collection identified. There is associated intramuscular edema. IMPRESSION: 1. Findings concerning for necrotizing fasciitis of the right thigh with abnormal irregular fluid signal extending throughout the superficial to deep intramuscular fascial planes of the right quadriceps and hamstrings musculature. No loculated collection identified. There is associated intramuscular edema. 2. Diffuse subcutaneous edema and cutaneous thickening of the right thigh, compatible with cellulitis. Electronically Signed   By: Hart Robinsons M.D.   On: 04/04/2023 19:24   DG Chest Port 1 View Result Date: 04/04/2023 CLINICAL DATA:  Questionable sepsis EXAM: PORTABLE CHEST 1 VIEW COMPARISON:  Chest x-ray 02/11/2017 FINDINGS: The heart is enlarged. Surgical clips overlie the left hilum, unchanged. There some minimal scarring or atelectasis in the left costophrenic angle. There is no pleural effusion or pneumothorax. No acute fractures are seen. IMPRESSION: 1. Cardiomegaly. 2. Minimal scarring or atelectasis in the left costophrenic angle. Electronically Signed   By: Darliss Cheney M.D.   On: 04/04/2023 15:22    Scheduled Meds:  atorvastatin  10 mg Oral q1800   linezolid  600 mg Oral Q12H    Continuous Infusions:  lactated ringers 75 mL/hr at 04/06/23 0916     LOS: 2 days   [name], MD [facility] P4/05/2023, 11:05 AM

## 2023-04-06 NOTE — Progress Notes (Signed)
 Rapid Response Event Note   Reason for Call : 95.3 rectal temperature   Initial Focused Assessment: Pt sitting up in bed laughing and talking.  Pt A/O x 4.  Denies pain.  Rectal temperature rechecked 95.8 warm blankets applied.  Room temperature adjusted. Socks placed on pt feet.    Interventions: MD made aware, fluid warmer and lactic orders placed.  See flowsheet for VS.   Plan of Care: Day Rapid, RN made aware.  Primary RN to follow up with pt temperature.  Continue to monitor closely and report.   Event Summary:   MD Notified: yes Call Time: 4098 Arrival [id number] End Time: 0715  Conley Rolls, RN

## 2023-04-07 DIAGNOSIS — L03115 Cellulitis of right lower limb: Secondary | ICD-10-CM | POA: Diagnosis not present

## 2023-04-07 LAB — BASIC METABOLIC PANEL WITH GFR
Anion gap: 9 (ref 5–15)
BUN: 48 mg/dL — ABNORMAL HIGH (ref 8–23)
CO2: 19 mmol/L — ABNORMAL LOW (ref 22–32)
Calcium: 8.7 mg/dL — ABNORMAL LOW (ref 8.9–10.3)
Chloride: 108 mmol/L (ref 98–111)
Creatinine, Ser: 1.94 mg/dL — ABNORMAL HIGH (ref 0.61–1.24)
GFR, Estimated: 31 mL/min — ABNORMAL LOW (ref >60)
Glucose, Bld: 98 mg/dL (ref 70–99)
Potassium: 5.1 mmol/L (ref 3.5–5.1)
Sodium: 136 mmol/L (ref 135–145)

## 2023-04-07 LAB — CBC
HCT: 27 % — ABNORMAL LOW (ref 39.0–52.0)
Hemoglobin: 8.3 g/dL — ABNORMAL LOW (ref 13.0–17.0)
MCH: 29.1 pg (ref 26.0–34.0)
MCHC: 30.7 g/dL (ref 30.0–36.0)
MCV: 94.7 fL (ref 80.0–100.0)
Platelets: 158 10*3/uL (ref 150–400)
RBC: 2.85 MIL/uL — ABNORMAL LOW (ref 4.22–5.81)
RDW: 20.2 % — ABNORMAL HIGH (ref 11.5–15.5)
WBC: 7.4 10*3/uL (ref 4.0–10.5)
nRBC: 0.3 % — ABNORMAL HIGH (ref 0.0–0.2)

## 2023-04-07 MED ORDER — ZINC OXIDE 40 % EX OINT
TOPICAL_OINTMENT | Freq: Three times a day (TID) | CUTANEOUS | Status: DC
  Administered 2023-04-07: 1 via TOPICAL
  Filled 2023-04-07: qty 57

## 2023-04-07 MED ORDER — MEDIHONEY WOUND/BURN DRESSING EX PSTE
1.0000 | PASTE | Freq: Every day | CUTANEOUS | Status: DC
  Administered 2023-04-07 – 2023-04-08 (×2): 1 via TOPICAL
  Filled 2023-04-07: qty 44

## 2023-04-07 NOTE — Consult Note (Addendum)
 WOC Nurse Consult Note: Reason for Consult: sacral pressure ulcer  Wound type: Stage 3 Pressure Injury sacrum/B buttocks  Pressure Injury POA: Yes Measurement: see nursing flowsheet  Wound bed: 75% pink moist 25% yellow  Drainage (amount, consistency, odor) per nursing flowsheet  Periwound: erythema, some moisture associated skin damage  Dressing procedure/placement/frequency: Cleanse sacral wound with Vashe wound cleanser [facility] [place] [phone]), do not rinse and allow to air dry. Apply Medihoney to wound beds daily, cover with dry gauze.  Apply Desitin to surrounding skin.  Cover with silicone foam or ABD pad and gauze whichever is preferred.   POC discussed with bedside nurse. WOC team will not follow. Re-consult if further needs arise.   Thank you,    Priscella Mann MSN, RN-BC, [employer] [phone]

## 2023-04-07 NOTE — Progress Notes (Signed)
 PROGRESS NOTE  ALSON MCPHEETERS  QIO:[id number] DOB: 1925/05/17 DOA: 04/04/2023 PCP: Georgianne Fick, MD   Brief Narrative: Patient is a 89 year old male with history of hypertension, hyperlipidemia , chronic iron deficiency anemia who presented with complaint of  pain, tenderness, erythema of right lower leg.  He was treated for cellulitis with outpatient antibiotics but patient failed to improve.  Presented with increased pain radiating up to right calf/inner thigh.  Pain on ambulation.  No fever or chills.  On presentation, he was hemodynamically stable without leukocytosis or lactic acidosis.  MRI showed features of necrotizing fasciitis of the right lower extremity.  Orthopedics consulted.  Plan for conservative management for now.  ID consulted, started on linezolid.  Clinically improving.  Possible discharge home tomorrow.  PT/OT consulted  Assessment & Plan:  Principal Problem:   Cellulitis, leg Active Problems:   Necrotizing fasciitis (HCC)  Necrotizing fasciitis of the right lower extremity: Presented with pain, erythema of the right lower extremity.  Was treated for cellulitis as an outpatient but did not respond.  MRI done. Findings concerning for necrotizing fasciitis of the right thigh ,no loculated collection, showed associated intramuscular edema,diffuse subcutaneous edema and cutaneous thickening of the right thigh, compatible with cellulitis. Orthopedics already following.   Initial plan was for debridement but currently on hold because he is currently improving with antibiotics.  ID consulted.  Currently on linezolid.  Plan for 14 days course Blood cultures have not shown any growth.  Currently afebrile, no leukocytosis.  Hypothermia: Unclear etiology.  Improved with warmed IV fluid.  Lactate level normal  AKI in CKD stage IIIb: Looks like his baseline creatinine around 1.6.  Presented with creatinine in the range of 2.  Likely prerenal AKI from dehydration, poor oral  intake, relative hypotension.  Patient was also taking Bactrim, lisinopril as an outpatient.  Currently kidney function close to baseline.  Monitor kidney function.  Chronic normocytic/iron deficiency anemia: Chronic normocytic anemia, iron-deficiency.  Baseline hemoglobin around 8.  Given a unit of blood transfusion on 4/4.  Currently hemoglobin at baseline.  No evidence of acute blood loss  Hypertension: Blood pressure was soft on presentation.  Home Cardizem, lisinopril on hold.  Currently normotensive  Hyperlipidemia: On Lipitor  Debility/deconditioning: PT/OT consulted         Pressure Injury 04/04/23 Perineum Left;Right Stage 2 -  Partial thickness loss of dermis presenting as a shallow open injury with a red, pink wound bed without slough. (Active)  04/04/23 2030  Location: Perineum  Location Orientation: Left;Right  Staging: Stage 2 -  Partial thickness loss of dermis presenting as a shallow open injury with a red, pink wound bed without slough.  Wound Description (Comments):   Present on Admission: Yes  Dressing Type Foam - Lift dressing to assess site every shift 04/07/23 0745    DVT prophylaxis:SCDs Start: 04/04/23 2026     Code Status: Do not attempt resuscitation (DNR) PRE-ARREST INTERVENTIONS DESIRED  Family Communication: Discussed with wife at bedside on 4/6  Patient status:Inpatient  Patient is from :Home  Anticipated discharge LK:[facility]  Estimated DC date: Tomorrow   Consultants: Orthopedics,ID  Procedures:None  Antimicrobials:  Anti-infectives (From admission, onward)    Start     Dose/Rate Route Frequency Ordered Stop   04/05/23 1600  linezolid (ZYVOX) tablet 600 mg        600 mg Oral Every 12 hours 04/05/23 1330     04/05/23 1400  Ampicillin-Sulbactam (UNASYN) 3 g in sodium chloride 0.9 % 100  mL IVPB  Status:  Discontinued        3 g 200 mL/hr over 30 Minutes Intravenous Every 12 hours 04/05/23 1039 04/05/23 1326   04/05/23 0400  linezolid  (ZYVOX) IVPB 600 mg  Status:  Discontinued        600 mg 300 mL/hr over 60 Minutes Intravenous Every 12 hours 04/04/23 2022 04/05/23 1329   04/04/23 2200  piperacillin-tazobactam (ZOSYN) IVPB 3.375 g  Status:  Discontinued        3.375 g 12.5 mL/hr over 240 Minutes Intravenous Every 8 hours 04/04/23 2025 04/05/23 1039   04/04/23 1430  piperacillin-tazobactam (ZOSYN) IVPB 3.375 g        3.375 g 100 mL/hr over 30 Minutes Intravenous  Once 04/04/23 1415 04/04/23 1539   04/04/23 1430  linezolid (ZYVOX) IVPB 600 mg        600 mg 300 mL/hr over 60 Minutes Intravenous  Once 04/04/23 1415 04/04/23 1902       Subjective: Patient seen and examined at bedside today.  Hemodynamically stable.  Very comfortable.  Lying in bed.  Blood pressure better today.  Temperature is better.  Denies any significant pain on the right lower extremity.  Right lower extremity appears less erythematous, dry.  We discussed about possibility of going home tomorrow with oral antibiotics.  PT/OT consulted for weakness  Objective: Vitals:   04/06/23 2005 04/06/23 2159 04/07/23 0509 04/07/23 0900  BP: (!) 147/82  (!) 131/55   Pulse: 70  63   Resp: 20  20   Temp:  (!) 97.4 F (36.3 C) (!) 97.5 F (36.4 C)   TempSrc:  Oral Oral   SpO2: 98%  97%   Weight:    72.6 kg  Height:        Intake/Output Summary (Last 24 hours) at 04/07/2023 1113 Last data filed at 04/07/2023 0800 Gross per 24 hour  Intake 360 ml  Output 100 ml  Net 260 ml   Filed Weights   04/04/23 1156 04/07/23 0900  Weight: 80.2 kg 72.6 kg    Examination:  General exam: Overall comfortable, not in distress, pleasant elderly male HEENT: PERRL Respiratory system:  no wheezes or crackles  Cardiovascular system: S1 & S2 heard, RRR.  Gastrointestinal system: Abdomen is nondistended, soft and nontender. Central nervous system: Alert and oriented Extremities: No edema, no clubbing ,no cyanosis Skin: Erythematous discoloration of bilateral legs,  scattered bruising, shallow ulcer on the proximal part of the right leg   Data Reviewed: I have personally reviewed following labs and imaging studies  CBC: Recent Labs  Lab 04/04/23 1207 04/05/23 0839 04/05/23 1855 04/06/23 0535 04/07/23 0520  WBC 9.1 6.4  --  6.2 7.4  NEUTROABS 7.9*  --   --   --   --   HGB 8.0* 6.9* 8.2* 7.9* 8.3*  HCT 25.5* 22.1* 26.8* 25.1* 27.0*  MCV 95.9 95.3  --  93.7 94.7  PLT 147* 116*  --  135* 158   Basic Metabolic Panel: Recent Labs  Lab 04/04/23 1207 04/05/23 0839 04/06/23 0535 04/07/23 0520  NA 138 137 137 136  K 4.5 4.7 4.9 5.1  CL 110 109 109 108  CO2 19* 19* 18* 19*  GLUCOSE 90 99 87 98  BUN 79* 63* 57* 48*  CREATININE 2.09* 1.91* 1.99* 1.94*  CALCIUM 8.9 8.4* 8.7* 8.7*     Recent Results (from the past 240 hours)  Resp panel by RT-PCR (RSV, Flu A&B, Covid) Anterior Nasal Swab  Status: None   Collection Time: 04/04/23  2:28 PM   Specimen: Anterior Nasal Swab  Result Value Ref Range Status   SARS Coronavirus 2 by RT PCR NEGATIVE NEGATIVE Final    Comment: (NOTE) SARS-CoV-2 target nucleic acids are NOT DETECTED.  The SARS-CoV-2 RNA is generally detectable in upper respiratory specimens during the acute phase of infection. The lowest concentration of SARS-CoV-2 viral copies this assay can detect is 138 copies/mL. A negative result does not preclude SARS-Cov-2 infection and should not be used as the sole basis for treatment or other patient management decisions. A negative result may occur with  improper specimen collection/handling, submission of specimen other than nasopharyngeal swab, presence of viral mutation(s) within the areas targeted by this assay, and inadequate number of viral copies(<138 copies/mL). A negative result must be combined with clinical observations, patient history, and epidemiological information. The expected result is Negative.  Fact Sheet for Patients:   BloggerCourse.com  Fact Sheet for Healthcare Providers:  SeriousBroker.it  This test is no t yet approved or cleared by the [place] FDA and  has been authorized for detection and/or diagnosis of SARS-CoV-2 by FDA under an Emergency Use Authorization (EUA). This EUA will remain  in effect (meaning this test can be used) for the duration of the COVID-19 declaration under Section 564(b)(1) of the Act, 21 U.S.C.section 360bbb-3(b)(1), unless the authorization is terminated  or revoked sooner.       Influenza A by PCR NEGATIVE NEGATIVE Final   Influenza B by PCR NEGATIVE NEGATIVE Final    Comment: (NOTE) The Xpert Xpress SARS-CoV-2/FLU/RSV plus assay is intended as an aid in the diagnosis of influenza from Nasopharyngeal swab specimens and should not be used as a sole basis for treatment. Nasal washings and aspirates are unacceptable for Xpert Xpress SARS-CoV-2/FLU/RSV testing.  Fact Sheet for Patients: BloggerCourse.com  Fact Sheet for Healthcare Providers: SeriousBroker.it  This test is not yet approved or cleared by the [place] FDA and has been authorized for detection and/or diagnosis of SARS-CoV-2 by FDA under an Emergency Use Authorization (EUA). This EUA will remain in effect (meaning this test can be used) for the duration of the COVID-19 declaration under Section 564(b)(1) of the Act, 21 U.S.C. section 360bbb-3(b)(1), unless the authorization is terminated or revoked.     Resp Syncytial Virus by PCR NEGATIVE NEGATIVE Final    Comment: (NOTE) Fact Sheet for Patients: BloggerCourse.com  Fact Sheet for Healthcare Providers: SeriousBroker.it  This test is not yet approved or cleared by the [place] FDA and has been authorized for detection and/or diagnosis of SARS-CoV-2 by FDA under an Emergency Use  Authorization (EUA). This EUA will remain in effect (meaning this test can be used) for the duration of the COVID-19 declaration under Section 564(b)(1) of the Act, 21 U.S.C. section 360bbb-3(b)(1), unless the authorization is terminated or revoked.  Performed at [facility], [address] [place], [place] [address]   Blood Culture (routine x 2)     Status: None (Preliminary result)   Collection Time: 04/04/23  2:40 PM   Specimen: BLOOD RIGHT FOREARM  Result Value Ref Range Status   Specimen Description   Final    BLOOD RIGHT FOREARM Performed at [facility], [address], [place], [place] [address]    Special Requests   Final    BOTTLES DRAWN AEROBIC AND ANAEROBIC Blood Culture adequate volume Performed at [facility], [address], [place], [place] [address]  Culture   Final    NO GROWTH 3 DAYS Performed at [facility], [address], [place], [place] [address]    Report Status PENDING  Incomplete  Blood Culture (routine x 2)     Status: None (Preliminary result)   Collection Time: 04/04/23  3:23 PM   Specimen: BLOOD LEFT ARM  Result Value Ref Range Status   Specimen Description   Final    BLOOD LEFT ARM Performed at [facility], [address], [place], [place] [address]    Special Requests   Final    BOTTLES DRAWN AEROBIC AND ANAEROBIC Blood Culture results may not be optimal due to an inadequate volume of blood received in culture bottles Performed at [facility], [address], [place], [place] [address]    Culture   Final    NO GROWTH 3 DAYS Performed at [facility], [address], [place], [place] [address]    Report Status PENDING  Incomplete     Radiology Studies: No results found.   Scheduled Meds:  atorvastatin  10 mg Oral q1800   linezolid  600 mg Oral Q12H   Continuous Infusions:     LOS: 3 days   [name], MD [facility] P4/06/2023,  11:13 AM

## 2023-04-07 NOTE — Progress Notes (Signed)
       Subjective:  Patient reports pain as mild and improved.  Slept okay.  Does not believe his infection has worsened.  Able to move the leg around without significant pain.  No overnight events.  Denies chest pain, ShOB, N/V.  Denies numbness or tingling.  No other complaints at this time.  Objective:   VITALS:   Vitals:   04/06/23 1648 04/06/23 2005 04/06/23 2159 04/07/23 0509  BP: (!) 139/47 (!) 147/82  (!) 131/55  Pulse: 71 70  63  Resp:  20  20  Temp: (!) 97.4 F (36.3 C)  (!) 97.4 F (36.3 C) (!) 97.5 F (36.4 C)  TempSrc: Oral  Oral Oral  SpO2: 95% 98%  97%  Weight:      Height:        See photos for sacral wound and for erythematous appearance of right leg  LLE Some tenderness along erythematous skin No significant fluctuance, some areas of induration along lateral distal thigh  No knee or ankle effusion  Tolerates gentle hip ROM without significant pain Sens DPN, SPN, TN intact  Motor EHL, ext, flex 5/5  Decent perfusion of foot, CRT < 2, 2+ PT pulse             Lab Results  Component Value Date   WBC 7.4 04/07/2023   HGB 8.3 (L) 04/07/2023   HCT 27.0 (L) 04/07/2023   MCV 94.7 04/07/2023   PLT 158 04/07/2023   BMET    Component Value Date/Time   NA 136 04/07/2023 0520   K 5.1 04/07/2023 0520   CL 108 04/07/2023 0520   CO2 19 (L) 04/07/2023 0520   GLUCOSE 98 04/07/2023 0520   BUN 48 (H) 04/07/2023 0520   CREATININE 1.94 (H) 04/07/2023 0520   CREATININE 1.86 (H) 08/17/2020 1119   CALCIUM 8.7 (L) 04/07/2023 0520   GFRNONAA 31 (L) 04/07/2023 0520   GFRNONAA 33 (L) 08/17/2020 1119     Assessment/Plan:     Principal Problem:   Cellulitis, leg Active Problems:   Necrotizing fasciitis (HCC)     Narrative/Plan: 04/07/23: Patient hemodynamically stable.  No significant changes in 24 hours since last evaluation.  Again labs and clinical examination reassuring against necrotizing fasciitis.  Wound consult placed yesterday for sacral  pressure sore however reportedly unavailable during weekend hours, plan for eval likely tomorrow.  Communicated this with hospitalist who is aware.  Recommend continuing antibiotic treatment of cellulitis, defer MRI / debridement for time being.  Can continue to follow as needed.   Weightbearing: WBAT ROM/Precautions:  Standard isolation precautions, no restrictions on ROM Pain management: multimodal  Antibiotics: Per medicine/ID, currently on linezolid and augmentin Diet: Advance as tolerated VTE prophylaxis: Not candidate Dispo: per medicine/ID, likely Monday if stable/improving Follow - up plan: further info to follow   Cecil Cobbs 04/07/2023, 7:07 AM   Contact information:   Weekdays 7am-5pm epic message Dr. Blanchie Dessert, or call office   After hours and holidays please check [email or web address] for group call information for [facility]

## 2023-04-08 ENCOUNTER — Encounter: Payer: Self-pay | Admitting: Physician Assistant

## 2023-04-08 ENCOUNTER — Other Ambulatory Visit (HOSPITAL_COMMUNITY): Payer: Self-pay

## 2023-04-08 DIAGNOSIS — L03115 Cellulitis of right lower limb: Secondary | ICD-10-CM | POA: Diagnosis not present

## 2023-04-08 MED ORDER — LINEZOLID 600 MG PO TABS: 600.0000 mg | ORAL_TABLET | Freq: Two times a day (BID) | ORAL | 0 refills | Status: AC

## 2023-04-08 MED ORDER — ZINC OXIDE 40 % EX OINT: TOPICAL_OINTMENT | Freq: Three times a day (TID) | CUTANEOUS | 0 refills | Status: DC

## 2023-04-08 MED ORDER — MEDIHONEY WOUND/BURN DRESSING EX PSTE: 1.0000 | PASTE | Freq: Every day | CUTANEOUS | 1 refills | Status: DC

## 2023-04-08 NOTE — Evaluation (Signed)
 Occupational Therapy Evaluation Patient Details Name: [name] MRN: [id number] DOB: 25-Jul-1925 Today's Date: 04/08/2023   History of Present Illness   Adolpho Meenach is a 89 y.o male presenting to [facility] with R LE cellulitis. PMH: Hyperlipidemia, HTN, anemia, OA, COPD, CAD, GERD, mitral regurg, MVP, Gout     Clinical Impressions PTA, patient lives with wife and was modified independent with rollator for BADL's and household mobility. Wife provides some assistance the past few weeks due to LE pain and has a transport w/c. Patient evaluated by Occupational Therapy with no further acute OT needs identified. All education has been completed with patient and wife and the patient has no further questions.  See below for any follow-up Occupational Therapy or equipment needs. OT is signing off. Thank you for this referral.      If plan is discharge home, recommend the following:   A little help with walking and/or transfers;A little help with bathing/dressing/bathroom;Assistance with cooking/housework;Direct supervision/assist for medications management;Direct supervision/assist for financial management;Help with stairs or ramp for entrance;Assist for transportation     Functional Status Assessment   Patient has had a recent decline in their functional status and demonstrates the ability to make significant improvements in function in a reasonable and predictable amount of time.     Equipment Recommendations   None recommended by OT      Precautions/Restrictions   Precautions Precautions: Fall Restrictions Weight Bearing Restrictions Per Provider Order: No Other Position/Activity Restrictions: WBAT     Mobility Bed Mobility Overal bed mobility:  (pt up in recliner already)                  Transfers Overall transfer level: Needs assistance Equipment used: Rolling walker (2 wheels) Transfers: Sit to/from Stand, Bed to chair/wheelchair/BSC Sit to Stand:  Supervision     Step pivot transfers: Supervision     General transfer comment: wife present for supervision demonstration and reports no issues providing upon d/c, they also use transport chair for longer distances      Balance Overall balance assessment: Mild deficits observed, not formally tested                                         ADL either performed or assessed with clinical judgement   ADL Overall ADL's : Needs assistance/impaired Eating/Feeding: Independent   Grooming: Wash/dry hands;Wash/dry face;Oral care;Applying deodorant;Modified independent;Sitting;Standing   Upper Body Bathing: Modified independent;Sitting   Lower Body Bathing: Set up;Contact guard assist;Sitting/lateral leans;Sit to/from stand;With caregiver independent assisting (wife present and able to assist for now)   Upper Body Dressing : Independent;Sitting   Lower Body Dressing: Contact guard assist;Sitting/lateral leans;Sit to/from stand;With caregiver independent assisting (wife able to provide support as needed initially)   Toilet Transfer: Supervision/safety;With caregiver independent assisting   Toileting- Clothing Manipulation and Hygiene: Independent       Functional mobility during ADLs: Supervision/safety;Rolling walker (2 wheels);Caregiver able to provide necessary level of assistance (wife present and demonstrated safe S techniques)       Vision Baseline Vision/History: 1 Wears glasses;0 No visual deficits Ability to See in Adequate Light: 0 Adequate Patient Visual Report: No change from baseline Vision Assessment?: No apparent visual deficits     Perception Perception: Within Functional Limits       Praxis Praxis: WFL       Pertinent Vitals/Pain Pain Assessment Pain Assessment: 0-10 Pain Score:  1  Pain Location: R LE Pain Descriptors / Indicators: Sore Pain Intervention(s): Monitored during session, Repositioned     Extremity/Trunk Assessment Upper  Extremity Assessment Upper Extremity Assessment: Right hand dominant       Cervical / Trunk Assessment Cervical / Trunk Assessment: Normal   Communication Communication Communication: No apparent difficulties Factors Affecting Communication: Hearing impaired   Cognition Arousal: Alert Behavior During Therapy: WFL for tasks assessed/performed Cognition: No apparent impairments                               Following commands: Intact       Cueing  General Comments   Cueing Techniques: Verbal cues  discoloration and bandage to r LE intact   Exercises     Shoulder Instructions      Home Living Family/patient expects to be discharged to:: Private residence Living Arrangements: Spouse/significant other Available Help at Discharge: Family Type of Home: House Home Access: Level entry     Home Layout: One level     Bathroom Shower/Tub: Walk-in shower;Door   [employer] Toilet: Handicapped height Bathroom Accessibility: Yes How Accessible: Accessible via walker Home Equipment: Rollator (4 wheels);Transport chair;Cane - single point;Shower seat;BSC/3in1;Hand held shower head;Grab bars - tub/shower          Prior Functioning/Environment Prior Level of Function : Independent/Modified Independent (wife drives)             Mobility Comments: rollator with mod I ADLs Comments: mod I      OT Goals(Current goals can be found in the care plan section)   Acute Rehab OT Goals Patient Stated Goal: to go home OT Goal Formulation: All assessment and education complete, DC therapy   AM-PAC OT "6 Clicks" Daily Activity     Outcome Measure Help from another person eating meals?: None Help from another person taking care of personal grooming?: None Help from another person toileting, which includes using toliet, bedpan, or urinal?: A Little Help from another person bathing (including washing, rinsing, drying)?: A Little Help from another person to put on  and taking off regular upper body clothing?: None Help from another person to put on and taking off regular lower body clothing?: A Little 6 Click Score: 21   End of Session Equipment Utilized During Treatment: Gait belt;Rolling walker (2 wheels) Nurse Communication: Mobility status  Activity Tolerance: Patient tolerated treatment well Patient left: in chair;with call bell/phone within reach;with family/visitor present                   Time: 4098-1191 OT Time Calculation (min): 33 min Charges:  OT General Charges $OT Visit: 1 Visit OT Evaluation $OT Eval Moderate Complexity: 1 Mod OT Treatments $Self Care/Home Management : 8-22 mins Ivanna Kocak OT/L Acute Rehabilitation Department  [phone]  04/08/2023, 10:37 AM

## 2023-04-08 NOTE — Evaluation (Signed)
 Physical Therapy Evaluation Patient Details Name: [name] MRN: [id number] DOB: 1925-08-30 Today's Date: 04/08/2023  History of Present Illness  89 y.o male presenting to [facility] with R LE cellulitis. PMH: Hyperlipidemia, HTN, anemia, OA, COPD, CAD, GERD, mitral regurg, MVP, Gout  Clinical Impression  On eval, pt was Supv-CGA for mobility. He walked ~100 feet with a rollator. Pt tolerated activity well. Pain rated 5/10 with ambulation. Wife present during session. Discussed d/c plan-pt will return home with his wife. Pt and wife politely declined any f/u after discharge-pt reports he does not have any concerns regarding his ability to mobilize at home. Will plan to follow and progress activity as tolerated. Recommend daily hallway ambulation with nursing and/or mobility team.         If plan is discharge home, recommend the following: A little help with walking and/or transfers;A little help with bathing/dressing/bathroom;Assistance with cooking/housework;Assist for transportation;Help with stairs or ramp for entrance   Can travel by private vehicle        Equipment Recommendations None recommended by PT  Recommendations for Other Services       Functional Status Assessment Patient has had a recent decline in their functional status and demonstrates the ability to make significant improvements in function in a reasonable and predictable amount of time.     Precautions / Restrictions Precautions Precautions: Fall Restrictions Weight Bearing Restrictions Per Provider Order: No Other Position/Activity Restrictions: WBAT      Mobility  Bed Mobility               General bed mobility comments: pt in recliner    Transfers Overall transfer level: Needs assistance Equipment used: Rolling walker (2 wheels) Transfers: Sit to/from Stand Sit to Stand: Contact guard assist           General transfer comment: Pt able to properly operate rollator (which is the type of  walker he uses at home). Increased time.    Ambulation/Gait Ambulation/Gait assistance: Supervision Gait Distance (Feet): 100 Feet Assistive device: Rollator (4 wheels) Gait Pattern/deviations: Step-through pattern, Decreased stride length, Trunk flexed       General Gait Details: No LOB with rollator use. Pt tolerated distance well.  Stairs            Wheelchair Mobility     Tilt Bed    Modified Rankin (Stroke Patients Only)       Balance Overall balance assessment: Mild deficits observed, not formally tested                                           Pertinent Vitals/Pain Pain Assessment Pain Assessment: 0-10 Pain Score: 5  Pain Location: R LE Pain Descriptors / Indicators: Discomfort Pain Intervention(s): Monitored during session    Home Living Family/patient expects to be discharged to:: Private residence Living Arrangements: Spouse/significant other Available Help at Discharge: Family Type of Home: House Home Access: Level entry       Home Layout: One level Home Equipment: Rollator (4 wheels);Transport chair;Cane - single point;Shower seat;BSC/3in1;Hand held shower head;Grab bars - tub/shower      Prior Function Prior Level of Function : Independent/Modified Independent             Mobility Comments: rollator with mod I ADLs Comments: mod I. wife drives.     Extremity/Trunk Assessment   Upper Extremity Assessment Upper Extremity Assessment: Defer to  OT evaluation    Lower Extremity Assessment Lower Extremity Assessment: Generalized weakness    Cervical / Trunk Assessment Cervical / Trunk Assessment: Normal  Communication   Communication Communication: No apparent difficulties Factors Affecting Communication: Hearing impaired    Cognition Arousal: Alert Behavior During Therapy: WFL for tasks assessed/performed                             Following commands: Intact       Cueing Cueing  Techniques: Verbal cues     General Comments      Exercises     Assessment/Plan    PT Assessment Patient needs continued PT services  PT Problem List Decreased strength;Decreased activity tolerance;Decreased balance;Decreased mobility;Pain       PT Treatment Interventions DME instruction;Gait training;Functional mobility training;Therapeutic activities;Therapeutic exercise;Patient/family education;Balance training    PT Goals (Current goals can be found in the Care Plan section)  Acute Rehab PT Goals Patient Stated Goal: home soon PT Goal Formulation: With patient/family Time For Goal Achievement: 04/22/23 Potential to Achieve Goals: Good    Frequency Min 2X/week     Co-evaluation               AM-PAC PT "6 Clicks" Mobility  Outcome Measure Help needed turning from your back to your side while in a flat bed without using bedrails?: None Help needed moving from lying on your back to sitting on the side of a flat bed without using bedrails?: A Little Help needed moving to and from a bed to a chair (including a wheelchair)?: A Little Help needed standing up from a chair using your arms (e.g., wheelchair or bedside chair)?: A Little Help needed to walk in hospital room?: A Little Help needed climbing 3-5 steps with a railing? : A Lot 6 Click Score: 18    End of Session Equipment Utilized During Treatment: Gait belt Activity Tolerance: Patient tolerated treatment well Patient left: in chair;with call bell/phone within reach;with family/visitor present   PT Visit Diagnosis: Unsteadiness on feet (R26.81)    Time: 2706-2376 PT Time Calculation (min) (ACUTE ONLY): 11 min   Charges:   PT Evaluation $PT Eval Low Complexity: 1 Low   PT General Charges $$ ACUTE PT VISIT: 1 Visit            Faye Ramsay, PT Acute Rehabilitation  Office: [phone]

## 2023-04-08 NOTE — Discharge Summary (Signed)
 Physician Discharge Summary  GRANVIL DJORDJEVIC III ZOX:[id number] DOB: July 03, 1925 DOA: 04/04/2023  PCP: Georgianne Fick, MD  Admit date: 04/04/2023 Discharge date: 04/08/2023  Admitted From: Home Disposition:  Home  Discharge Condition:Stable CODE STATUS:DNR Diet recommendation:  Regular   Brief/Interim Summary: Patient is a 89 year old male with history of hypertension, hyperlipidemia , chronic iron deficiency anemia who presented with complaint of  pain, tenderness, erythema of right lower leg.  He was treated for cellulitis with outpatient antibiotics but patient failed to improve.  Presented with increased pain radiating up to right calf/inner thigh.  Pain on ambulation.  No fever or chills.  On presentation, he was hemodynamically stable without leukocytosis or lactic acidosis.  MRI showed features of necrotizing fasciitis of the right lower extremity.  Orthopedics consulted,ID consulted, started on linezolid.  Clinically improving.  No plan for surgical intervention.  Medically stable for discharge to home with oral antibiotics today.  Following problems were addressed during the hospitalization:  Necrotizing fasciitis of the right lower extremity: Presented with pain, erythema of the right lower extremity.  Was treated for cellulitis as an outpatient but did not respond.  MRI done. Findings concerning for necrotizing fasciitis of the right thigh ,no loculated collection, showed associated intramuscular edema,diffuse subcutaneous edema and cutaneous thickening of the right thigh, compatible with cellulitis. Orthopedics ,  ID consulted.  Currently on linezolid.  Plan for total  of 14 days course Blood cultures have not shown any growth.  Currently afebrile, no leukocytosis.  No plan for surgical intervention   AKI in CKD stage IIIb: Looks like his baseline creatinine around 1.6.  Presented with creatinine in the range of 2.  Likely prerenal AKI from dehydration, poor oral intake, relative  hypotension.  Patient was also taking Bactrim, lisinopril as an outpatient.  Currently kidney function close to baseline.  Monitor kidney function.   Chronic normocytic/iron deficiency anemia: Chronic normocytic anemia, iron-deficiency.  Baseline hemoglobin around 8.  Given a unit of blood transfusion on 4/4.  Currently hemoglobin at baseline.  No evidence of acute blood loss   Hypertension: Blood pressure was soft on presentation.  Home Cardizem, lisinopril on hold.  Cardizem can be resumed on discharge   Hyperlipidemia: On Lipitor   Debility/deconditioning: PT/OT consulted   Discharge Diagnoses:  Principal Problem:   Cellulitis, leg Active Problems:   Necrotizing fasciitis [facility])    Discharge Instructions  Discharge Instructions     Diet general   Complete by: As directed    Discharge instructions   Complete by: As directed    1)Please take prescribed medications as instructed 2)Follow up with your PCP in a week   Discharge wound care:   Complete by: As directed    As per wound care nurse   Increase activity slowly   Complete by: As directed       Allergies as of 04/08/2023       Reactions   Sodium Bicarbonate Other (See Comments)   Made the patient feel "unwell, not normal."        Medication List     STOP taking these medications    cephALEXin 500 MG capsule Commonly known as: KEFLEX   lisinopril 20 MG tablet Commonly known as: ZESTRIL   sulfamethoxazole-trimethoprim 800-160 MG tablet Commonly known as: BACTRIM DS       TAKE these medications    acetaminophen 325 MG tablet Commonly known as: TYLENOL Take 650 mg by mouth every 6 (six) hours as needed for mild pain or headache.  allopurinol 300 MG tablet Commonly known as: ZYLOPRIM Take 150 mg by mouth daily.   aspirin EC 81 MG tablet Take 81 mg by mouth See admin instructions. Take 81 mg by mouth on Mondays and Wednesdays- swallow whole   atorvastatin 10 MG tablet Commonly known as:  LIPITOR TAKE 1 TABLET (10 MG TOTAL) BY MOUTH DAILY AT 6 PM.   diltiazem 120 MG 24 hr capsule Commonly known as: CARDIZEM CD Take 1 capsule (120 mg total) by mouth daily. Please call [phone] to schedule a  July appointment for future refills. Thank you. What changed:  when to take this additional instructions   ketoconazole 2 % cream Commonly known as: NIZORAL Apply 1 Application topically See admin instructions. Apply to affected area of the buttocks region 2 times a day as directed   leptospermum manuka honey Pste paste Apply 1 Application topically daily. Start taking on: April 09, 2023   linezolid 600 MG tablet Commonly known as: ZYVOX Take 1 tablet (600 mg total) by mouth every 12 (twelve) hours for 10 days.   liver oil-zinc oxide 40 % ointment Commonly known as: DESITIN Apply topically 3 (three) times daily.   omeprazole 20 MG tablet Commonly known as: PRILOSEC OTC Take 20 mg by mouth every other day.   OVER THE COUNTER MEDICATION Place 1 drop into both eyes See admin instructions. Thera Tears- Instill 1 drop into both eyes three times a day as needed for dryness   polyethylene glycol 17 g packet Commonly known as: MIRALAX / GLYCOLAX Take 17 g by mouth daily as needed for mild constipation or moderate constipation.   UNKNOWN TO PATIENT Place 1 drop into both eyes See admin instructions. Unnamed allergy relief eye drops- Instill 1 drop into both eyes three times a day as needed for itching and/or allergies               Discharge Care Instructions  (From admission, onward)           Start     Ordered   04/08/23 0000  Discharge wound care:       Comments: As per wound care nurse   04/08/23 1147            Follow-up Information     Georgianne Fick, MD. Schedule an appointment as soon as possible for a visit in 1 week(s).   Specialty: Internal Medicine Contact information: [address] [place]  [address] [phone]                Allergies  Allergen Reactions   Sodium Bicarbonate Other (See Comments)    Made the patient feel "unwell, not normal."    Consultations: ID, orthopedics   Procedures/Studies: MR [facility] RIGHT WO CONTRAST Result Date: 04/04/2023 CLINICAL DATA:  Right leg cellulitis. Swelling and pain. Concern for necrotizing fasciitis. EXAM: MRI OF THE RIGHT FEMUR WITHOUT CONTRAST TECHNIQUE: Multiplanar, multisequence MR imaging of the right femur was performed. No intravenous contrast was administered. COMPARISON:  None Available. FINDINGS: Bones/Joint/Cartilage No fracture or dislocation. Right knee arthroplasty with associated susceptibility artifact. Otherwise, no discrete marrow signal abnormality. Soft tissue/Muscles Subcutaneous edema and overlying cutaneous thickening extending along the right lateral hip and circumferential thigh to the level of the knee. Edema extends to the superficial fascia of the anterior and posterior compartment musculature of the thigh. There is increased abnormal T2/STIR hyperintense signal extending from the superficial fascia of the right quadriceps musculature into deeper intramuscular fascial planes, most notably along  the vastus intermedius, vastus lateralis, and rectus femoris deep intramuscular fascial planes. Similar changes are noted involving the posterior compartment hamstring musculature, with pronounced fluid extending along the biceps femoris, semitendinosus, and semimembranosus deep intramuscular fascial planes. No loculated collection identified. There is associated intramuscular edema. IMPRESSION: 1. Findings concerning for necrotizing fasciitis of the right thigh with abnormal irregular fluid signal extending throughout the superficial to deep intramuscular fascial planes of the right quadriceps and hamstrings musculature. No loculated collection identified. There is associated intramuscular edema. 2. Diffuse subcutaneous edema  and cutaneous thickening of the right thigh, compatible with cellulitis. Electronically Signed   By: Hart Robinsons M.D.   On: 04/04/2023 19:24   DG Chest Port 1 View Result Date: 04/04/2023 CLINICAL DATA:  Questionable sepsis EXAM: PORTABLE CHEST 1 VIEW COMPARISON:  Chest x-ray 02/11/2017 FINDINGS: The heart is enlarged. Surgical clips overlie the left hilum, unchanged. There some minimal scarring or atelectasis in the left costophrenic angle. There is no pleural effusion or pneumothorax. No acute fractures are seen. IMPRESSION: 1. Cardiomegaly. 2. Minimal scarring or atelectasis in the left costophrenic angle. Electronically Signed   By: Darliss Cheney M.D.   On: 04/04/2023 15:22   LE VENOUS Result Date: 03/29/2023  Lower Venous DVT Study Patient Name:  SYRE KNERR III  Date of Exam:   03/29/2023 Medical Rec #: [id number]             Accession #:    [id number] Date of Birth: 06-Mar-1925              Patient Gender: M Patient Age:   89 years Exam Location:  [facility] Procedure:      VAS [place] LOWER EXTREMITY VENOUS (DVT) Referring Phys: BRIAN HAGLER --------------------------------------------------------------------------------  Indications: Pain, Swelling, and Erythema.  Limitations: Poor ultrasound/tissue interface. Comparison Study: Previous exam on 10/30/2018 was negative for DVT Performing Technologist: Ernestene Mention RVT, RDMS  Examination Guidelines: A complete evaluation includes B-mode imaging, spectral Doppler, color Doppler, and power Doppler as needed of all accessible portions of each vessel. Bilateral testing is considered an integral part of a complete examination. Limited examinations for reoccurring indications may be performed as noted. The reflux portion of the exam is performed with the patient in reverse Trendelenburg.  +---------+---------------+---------+-----------+----------+--------------+ RIGHT    CompressibilityPhasicitySpontaneityPropertiesThrombus Aging  +---------+---------------+---------+-----------+----------+--------------+ CFV      Full           Yes      Yes                                 +---------+---------------+---------+-----------+----------+--------------+ SFJ      Full                                                        +---------+---------------+---------+-----------+----------+--------------+ FV Prox  Full           Yes      Yes                                 +---------+---------------+---------+-----------+----------+--------------+ FV Mid   Full           Yes      Yes                                 +---------+---------------+---------+-----------+----------+--------------+  FV DistalFull           Yes      Yes                                 +---------+---------------+---------+-----------+----------+--------------+ PFV      Full                                                        +---------+---------------+---------+-----------+----------+--------------+ POP      Full           Yes      Yes                                 +---------+---------------+---------+-----------+----------+--------------+ PTV      Full                                                        +---------+---------------+---------+-----------+----------+--------------+ PERO     Full                                                        +---------+---------------+---------+-----------+----------+--------------+ GSV      Full                                                        +---------+---------------+---------+-----------+----------+--------------+   +----+---------------+---------+-----------+----------+--------------+ LEFTCompressibilityPhasicitySpontaneityPropertiesThrombus Aging +----+---------------+---------+-----------+----------+--------------+ CFV Full           Yes      Yes                                  +----+---------------+---------+-----------+----------+--------------+     Summary: RIGHT: - There is no evidence of deep vein thrombosis in the lower extremity.  - No cystic structure found in the popliteal fossa. - Ultrasound characteristics of an enlarged lymph node is noted in the groin. extensive subcutaneous edema of lower extremity  LEFT: - No evidence of common femoral vein obstruction.   *See table(s) above for measurements and observations. Electronically signed by Carolynn Sayers on 03/29/2023 at 1:41:16 PM.    Final       Subjective: Patient seen and examined at bedside today.  Sitting in the chair.  Very comfortable.  Hemodynamically stable.  No new issues.  Denies any significant pain of the right lower extremity.  On examination, there is no significant edema of the right lower extremity.  We discussed about discharge planning to home with oral antibiotics  Discharge Exam: Vitals:   04/07/23 2024 04/08/23 0350  BP: (!) 155/69 (!) 149/65  Pulse: 60 72  Resp: 18 17  Temp: (!) 97.3 F (36.3 C) 97.8 F (36.6 C)  SpO2:  97% 96%   Vitals:   04/07/23 0900 04/07/23 1349 04/07/23 2024 04/08/23 0350  BP:  120/65 (!) 155/69 (!) 149/65  Pulse:  72 60 72  Resp:  20 18 17   Temp:  (!) 97.3 F (36.3 C) (!) 97.3 F (36.3 C) 97.8 F (36.6 C)  TempSrc:  Oral Oral Oral  SpO2:  98% 97% 96%  Weight: 72.6 kg     Height:        General: Pt is alert, awake, not in acute distress, very pleasant gentleman Cardiovascular: RRR, S1/S2 +, no rubs, no gallops Respiratory: CTA bilaterally, no wheezing, no rhonchi Abdominal: Soft, NT, ND, bowel sounds + Extremities: Mild erythematous changes of bilateral lower extremities, scattered bruises, shallow ulcers,  pressure ulcer on the bottom     The results of significant diagnostics from this hospitalization (including imaging, microbiology, ancillary and laboratory) are listed below for reference.     Microbiology: Recent Results (from the past  240 hours)  Resp panel by RT-PCR (RSV, Flu A&B, Covid) Anterior Nasal Swab     Status: None   Collection Time: 04/04/23  2:28 PM   Specimen: Anterior Nasal Swab  Result Value Ref Range Status   SARS Coronavirus 2 by RT PCR NEGATIVE NEGATIVE Final    Comment: (NOTE) SARS-CoV-2 target nucleic acids are NOT DETECTED.  The SARS-CoV-2 RNA is generally detectable in upper respiratory specimens during the acute phase of infection. The lowest concentration of SARS-CoV-2 viral copies this assay can detect is 138 copies/mL. A negative result does not preclude SARS-Cov-2 infection and should not be used as the sole basis for treatment or other patient management decisions. A negative result may occur with  improper specimen collection/handling, submission of specimen other than nasopharyngeal swab, presence of viral mutation(s) within the areas targeted by this assay, and inadequate number of viral copies(<138 copies/mL). A negative result must be combined with clinical observations, patient history, and epidemiological information. The expected result is Negative.  Fact Sheet for Patients:  BloggerCourse.com  Fact Sheet for Healthcare Providers:  SeriousBroker.it  This test is no t yet approved or cleared by the [place] FDA and  has been authorized for detection and/or diagnosis of SARS-CoV-2 by FDA under an Emergency Use Authorization (EUA). This EUA will remain  in effect (meaning this test can be used) for the duration of the COVID-19 declaration under Section 564(b)(1) of the Act, 21 U.S.C.section 360bbb-3(b)(1), unless the authorization is terminated  or revoked sooner.       Influenza A by PCR NEGATIVE NEGATIVE Final   Influenza B by PCR NEGATIVE NEGATIVE Final    Comment: (NOTE) The Xpert Xpress SARS-CoV-2/FLU/RSV plus assay is intended as an aid in the diagnosis of influenza from Nasopharyngeal swab specimens and should  not be used as a sole basis for treatment. Nasal washings and aspirates are unacceptable for Xpert Xpress SARS-CoV-2/FLU/RSV testing.  Fact Sheet for Patients: BloggerCourse.com  Fact Sheet for Healthcare Providers: SeriousBroker.it  This test is not yet approved or cleared by the [place] FDA and has been authorized for detection and/or diagnosis of SARS-CoV-2 by FDA under an Emergency Use Authorization (EUA). This EUA will remain in effect (meaning this test can be used) for the duration of the COVID-19 declaration under Section 564(b)(1) of the Act, 21 U.S.C. section 360bbb-3(b)(1), unless the authorization is terminated or revoked.     Resp Syncytial Virus by PCR NEGATIVE NEGATIVE Final    Comment: (NOTE) Fact Sheet for Patients: BloggerCourse.com  Fact  Sheet for Healthcare Providers: SeriousBroker.it  This test is not yet approved or cleared by the [place] and has been authorized for detection and/or diagnosis of SARS-CoV-2 by FDA under an Emergency Use Authorization (EUA). This EUA will remain in effect (meaning this test can be used) for the duration of the COVID-19 declaration under Section 564(b)(1) of the Act, 21 U.S.C. section 360bbb-3(b)(1), unless the authorization is terminated or revoked.  Performed at [facility], [address] [place], [place] [address]   Blood Culture (routine x 2)     Status: None (Preliminary result)   Collection Time: 04/04/23  2:40 PM   Specimen: BLOOD RIGHT FOREARM  Result Value Ref Range Status   Specimen Description   Final    BLOOD RIGHT FOREARM Performed at [facility], [address] [place], [place] [address]    Special Requests   Final    BOTTLES DRAWN AEROBIC AND ANAEROBIC Blood Culture adequate volume Performed at [facility], [address] [place],  [place] [address]    Culture   Final    NO GROWTH 4 DAYS Performed at [facility], [address] [place], [place] [address]    Report Status PENDING  Incomplete  Blood Culture (routine x 2)     Status: None (Preliminary result)   Collection Time: 04/04/23  3:23 PM   Specimen: BLOOD LEFT ARM  Result Value Ref Range Status   Specimen Description   Final    BLOOD LEFT ARM Performed at [facility], [address] [place], [place] [address]    Special Requests   Final    BOTTLES DRAWN AEROBIC AND ANAEROBIC Blood Culture results may not be optimal due to an inadequate volume of blood received in culture bottles Performed at [facility], [address], [place], [place] [address]    Culture   Final    NO GROWTH 4 DAYS Performed at [facility], [address], [place], [place] [address]    Report Status PENDING  Incomplete     Labs: BNP (last 3 results) No results for input(s): "BNP" in the last 8760 hours. Basic Metabolic Panel: Recent Labs  Lab 04/04/23 1207 04/05/23 0839 04/06/23 0535 04/07/23 0520  NA 138 137 137 136  K 4.5 4.7 4.9 5.1  CL 110 109 109 108  CO2 19* 19* 18* 19*  GLUCOSE 90 99 87 98  BUN 79* 63* 57* 48*  CREATININE 2.09* 1.91* 1.99* 1.94*  CALCIUM 8.9 8.4* 8.7* 8.7*   Liver Function Tests: Recent Labs  Lab 04/04/23 1207  AST 33  ALT 23  ALKPHOS 85  BILITOT 0.4  PROT 6.5  ALBUMIN 2.9*   No results for input(s): "LIPASE", "AMYLASE" in the last 168 hours. No results for input(s): "AMMONIA" in the last 168 hours. CBC: Recent Labs  Lab 04/04/23 1207 04/05/23 0839 04/05/23 1855 04/06/23 0535 04/07/23 0520  WBC 9.1 6.4  --  6.2 7.4  NEUTROABS 7.9*  --   --   --   --   HGB 8.0* 6.9* 8.2* 7.9* 8.3*  HCT 25.5* 22.1* 26.8* 25.1* 27.0*  MCV 95.9 95.3  --  93.7 94.7  PLT 147* 116*  --  135* 158   Cardiac Enzymes: Recent Labs  Lab 04/04/23 1207  CKTOTAL 49   BNP: Invalid input(s): "POCBNP" CBG: No results  for input(s): "GLUCAP" in the last 168 hours. D-Dimer No results for input(s): "  DDIMER" in the last 72 hours. Hgb A1c No results for input(s): "HGBA1C" in the last 72 hours. Lipid Profile No results for input(s): "CHOL", "HDL", "LDLCALC", "TRIG", "CHOLHDL", "LDLDIRECT" in the last 72 hours. Thyroid function studies No results for input(s): "TSH", "T4TOTAL", "T3FREE", "THYROIDAB" in the last 72 hours.  Invalid input(s): "FREET3" Anemia work up No results for input(s): "VITAMINB12", "FOLATE", "FERRITIN", "TIBC", "IRON", "RETICCTPCT" in the last 72 hours. Urinalysis    Component Value Date/Time   COLORURINE YELLOW 04/04/2023 1207   APPEARANCEUR CLEAR 04/04/2023 1207   LABSPEC 1.016 04/04/2023 1207   PHURINE 5.0 04/04/2023 1207   GLUCOSEU NEGATIVE 04/04/2023 1207   HGBUR NEGATIVE 04/04/2023 1207   BILIRUBINUR NEGATIVE 04/04/2023 1207   KETONESUR NEGATIVE 04/04/2023 1207   PROTEINUR 30 (A) 04/04/2023 1207   UROBILINOGEN 0.2 06/30/2013 2315   NITRITE NEGATIVE 04/04/2023 1207   LEUKOCYTESUR TRACE (A) 04/04/2023 1207   Sepsis Labs Recent Labs  Lab 04/04/23 1207 04/05/23 0839 04/06/23 0535 04/07/23 0520  WBC 9.1 6.4 6.2 7.4   Microbiology Recent Results (from the past 240 hours)  Resp panel by RT-PCR (RSV, Flu A&B, Covid) Anterior Nasal Swab     Status: None   Collection Time: 04/04/23  2:28 PM   Specimen: Anterior Nasal Swab  Result Value Ref Range Status   SARS Coronavirus 2 by RT PCR NEGATIVE NEGATIVE Final    Comment: (NOTE) SARS-CoV-2 target nucleic acids are NOT DETECTED.  The SARS-CoV-2 RNA is generally detectable in upper respiratory specimens during the acute phase of infection. The lowest concentration of SARS-CoV-2 viral copies this assay can detect is 138 copies/mL. A negative result does not preclude SARS-Cov-2 infection and should not be used as the sole basis for treatment or other patient management decisions. A negative result may occur with  improper  specimen collection/handling, submission of specimen other than nasopharyngeal swab, presence of viral mutation(s) within the areas targeted by this assay, and inadequate number of viral copies(<138 copies/mL). A negative result must be combined with clinical observations, patient history, and epidemiological information. The expected result is Negative.  Fact Sheet for Patients:  BloggerCourse.com  Fact Sheet for Healthcare Providers:  SeriousBroker.it  This test is no t yet approved or cleared by the [place] FDA and  has been authorized for detection and/or diagnosis of SARS-CoV-2 by FDA under an Emergency Use Authorization (EUA). This EUA will remain  in effect (meaning this test can be used) for the duration of the COVID-19 declaration under Section 564(b)(1) of the Act, 21 U.S.C.section 360bbb-3(b)(1), unless the authorization is terminated  or revoked sooner.       Influenza A by PCR NEGATIVE NEGATIVE Final   Influenza B by PCR NEGATIVE NEGATIVE Final    Comment: (NOTE) The Xpert Xpress SARS-CoV-2/FLU/RSV plus assay is intended as an aid in the diagnosis of influenza from Nasopharyngeal swab specimens and should not be used as a sole basis for treatment. Nasal washings and aspirates are unacceptable for Xpert Xpress SARS-CoV-2/FLU/RSV testing.  Fact Sheet for Patients: BloggerCourse.com  Fact Sheet for Healthcare Providers: SeriousBroker.it  This test is not yet approved or cleared by the [place] FDA and has been authorized for detection and/or diagnosis of SARS-CoV-2 by FDA under an Emergency Use Authorization (EUA). This EUA will remain in effect (meaning this test can be used) for the duration of the COVID-19 declaration under Section 564(b)(1) of the Act, 21 U.S.C. section 360bbb-3(b)(1), unless the authorization is terminated or revoked.     Resp  Syncytial Virus by PCR NEGATIVE NEGATIVE Final    Comment: (NOTE) Fact Sheet for Patients: BloggerCourse.com  Fact Sheet for Healthcare Providers: SeriousBroker.it  This test is not yet approved or cleared by the [place] FDA and has been authorized for detection and/or diagnosis of SARS-CoV-2 by FDA under an Emergency Use Authorization (EUA). This EUA will remain in effect (meaning this test can be used) for the duration of the COVID-19 declaration under Section 564(b)(1) of the Act, 21 U.S.C. section 360bbb-3(b)(1), unless the authorization is terminated or revoked.  Performed at [facility], [address], [place], [place] [address]   Blood Culture (routine x 2)     Status: None (Preliminary result)   Collection Time: 04/04/23  2:40 PM   Specimen: BLOOD RIGHT FOREARM  Result Value Ref Range Status   Specimen Description   Final    BLOOD RIGHT FOREARM Performed at [facility], [address] [place], [place] [address]    Special Requests   Final    BOTTLES DRAWN AEROBIC AND ANAEROBIC Blood Culture adequate volume Performed at [facility], [address], [place], [place] [address]    Culture   Final    NO GROWTH 4 DAYS Performed at [facility], [address] [place], [place] [address]    Report Status PENDING  Incomplete  Blood Culture (routine x 2)     Status: None (Preliminary result)   Collection Time: 04/04/23  3:23 PM   Specimen: BLOOD LEFT ARM  Result Value Ref Range Status   Specimen Description   Final    BLOOD LEFT ARM Performed at [facility], [address], [place], [place] [address]    Special Requests   Final    BOTTLES DRAWN AEROBIC AND ANAEROBIC Blood Culture results may not be optimal due to an inadequate volume of blood received in culture bottles Performed at [facility], [address], [place], [place]  [address]    Culture   Final    NO GROWTH 4 DAYS Performed at [facility], [address] [place], [place] [address]    Report Status PENDING  Incomplete    Please note: You were cared for by a hospitalist during your hospital stay. Once you are discharged, your primary care physician will handle any further medical issues. Please note that NO REFILLS for any discharge medications will be authorized once you are discharged, as it is imperative that you return to your primary care physician (or establish a relationship with a primary care physician if you do not have one) for your post hospital discharge needs so that they can reassess your need for medications and monitor your lab values.    Time coordinating discharge: 40 minutes  SIGNED:   [name], MD  [facility] 04/08/2023, 11:49 AM Pager [phone]  If 7PM-7AM, please contact night-coverage [email or web address] Password [email or web address]

## 2023-04-09 ENCOUNTER — Telehealth: Payer: Self-pay

## 2023-04-09 LAB — CULTURE, BLOOD (ROUTINE X 2)
Culture: NO GROWTH
Culture: NO GROWTH
Special Requests: ADEQUATE

## 2023-04-09 NOTE — Patient Instructions (Signed)
 Visit Information  Thank you for taking time to visit with me today. Please don't hesitate to contact me if I can be of assistance to you before our next scheduled telephone appointment.  Notify Dr. Nicholos Johns for questions or concerns.    Monitor right leg for redness, swelling and or increased drainage.  Monitor buttock for redness, swelling and or increased drainage.   The patient has been provided with contact information for the care management team and has been advised to call with any health related questions or concerns.   Please call the Suicide and Crisis Lifeline: 988 if you are experiencing a Mental Health or Behavioral Health Crisis or need someone to talk to.  Bary Leriche RN, MSN [facility], [facility] RN Care Manager Direct Dial: [phone]  Fax: [phone] Website: [email or web address]

## 2023-04-09 NOTE — Telephone Encounter (Signed)
 Received VM from patient stating that he was discharged from hospital and needs to reschedule lab and injection appt on 04/11/2023.  Schedule message sent.

## 2023-04-09 NOTE — Transitions of Care (Post Inpatient/ED Visit) (Signed)
 04/09/2023  Name: [name] MRN: [id number] DOB: 20-Jul-1925  Today's TOC FU Call Status: Today's TOC FU Call Status:: Successful TOC FU Call Completed TOC FU Call Complete Date: 04/09/23 Patient's Name and Date of Birth confirmed.  Transition Care Management Follow-up Telephone Call Date of Discharge: 04/08/23 Discharge Facility: [facility]) Type of Discharge: Inpatient Admission Primary Inpatient Discharge Diagnosis:: Leg Cellulitis How have you been since you were released from the hospital?: Better Any questions or concerns?: No  Items Reviewed: Did you receive and understand the discharge instructions provided?: Yes Any new allergies since your discharge?: No Do you have support at home?: Yes People in Home [RPT]: spouse Name of Support/Comfort Primary Source: Doris  Medications Reviewed Today: Medications Reviewed Today     Reviewed by Fleeta Emmer, RN (Case Manager) on 04/09/23 at 1336  Med List Status: <None>   Medication Order Taking? Sig Documenting Provider Last Dose Status Informant  acetaminophen (TYLENOL) 325 MG tablet [id number] Yes Take 650 mg by mouth every 6 (six) hours as needed for mild pain or headache. [provider] Taking Active Multiple Informants  allopurinol (ZYLOPRIM) 300 MG tablet [id number] Yes Take 150 mg by mouth daily.  [provider] Taking Active Multiple Informants           Med Note Antony Madura, Arn Medal   Thu Apr 04, 2023 10:33 PM) 0.5 tablet = 150 mg  aspirin EC 81 MG tablet [id number] Yes Take 81 mg by mouth See admin instructions. Take 81 mg by mouth on Mondays and Wednesdays- swallow whole [provider] Taking Active Multiple Informants  atorvastatin (LIPITOR) 10 MG tablet [id number] Yes TAKE 1 TABLET (10 MG TOTAL) BY MOUTH DAILY AT 6 PM. Runell Gess, MD Taking Active Multiple Informants  diltiazem (CARDIZEM CD) 120 MG 24 hr capsule [id number] Yes Take 1 capsule (120 mg total) by mouth daily. Please  call [phone] to schedule a  July appointment for future refills. Thank you.  Patient taking differently: Take 120 mg by mouth in the morning.   Runell Gess, MD Taking Active Multiple Informants  ketoconazole (NIZORAL) 2 % cream [id number] Yes Apply 1 Application topically See admin instructions. Apply to affected area of the buttocks region 2 times a day as directed [provider] Taking Active Multiple Informants  leptospermum manuka honey (MEDIHONEY) PSTE paste [id number] Yes Apply 1 Application topically daily. [name], MD Taking Active   linezolid (ZYVOX) 600 MG tablet [id number] Yes Take 1 tablet (600 mg total) by mouth every 12 (twelve) hours for 10 days. Odette Fraction, MD Taking Active   liver oil-zinc oxide (DESITIN) 40 % ointment [id number] Yes Apply topically 3 (three) times daily. [name], MD Taking Active   omeprazole (PRILOSEC OTC) 20 MG tablet [id number] Yes Take 20 mg by mouth every other day. [provider] Taking Active Multiple Informants  OVER THE COUNTER MEDICATION [id number] Yes Place 1 drop into both eyes See admin instructions. Thera Tears- Instill 1 drop into both eyes three times a day as needed for dryness [provider] Taking Active Multiple Informants  polyethylene glycol (MIRALAX / GLYCOLAX) packet [id number] Yes Take 17 g by mouth daily as needed for mild constipation or moderate constipation. [provider] Taking Active Multiple Informants  UNKNOWN TO PATIENT [id number]  Place 1 drop into both eyes See admin instructions. Unnamed allergy relief eye drops- Instill 1 drop into both eyes three times a day as needed for itching and/or allergies [provider]  Active Multiple Informants            Home Care and Equipment/Supplies: Were Home Health Services Ordered?: No Any new equipment or medical supplies ordered?: No  Functional Questionnaire: Do you need assistance with bathing/showering or  dressing?: Yes (wife assists) Do you need assistance with meal preparation?: Yes (wife fixes meals) Do you need assistance with eating?: No Do you have difficulty maintaining continence: No Do you need assistance with getting out of bed/getting out of a chair/moving?: No Do you have difficulty managing or taking your medications?: No  Follow up appointments reviewed: PCP Follow-up appointment confirmed?: Yes Date of PCP follow-up appointment?: 04/12/23 Follow-up Provider: Dr. Nicholos Johns Specialist [facility] Follow-up appointment confirmed?: NA Do you need transportation to your follow-up appointment?: No Do you understand care options if your condition(s) worsen?: Yes-patient verbalized understanding  SDOH Interventions Today    Flowsheet Row Most Recent Value  SDOH Interventions   Food Insecurity Interventions Intervention Not Indicated  Housing Interventions Intervention Not Indicated  Transportation Interventions Intervention Not Indicated  Utilities Interventions Intervention Not Indicated      Spoke with patient. He reports doing better since hospitalization. PCP follow up set for Friday. No transportation issues.  He reports he will be getting his medi-honey dressing from the pharmacy today.  He denies signs and symptoms of infection to buttock or leg areas.  Patient declined 30 days transition of care.    Bary Leriche RN, MSN [facility], [facility] RN Care Manager Direct Dial: [phone]  Fax: [phone] Website: [email or web address]

## 2023-04-11 ENCOUNTER — Inpatient Hospital Stay: Attending: Physician Assistant

## 2023-04-11 ENCOUNTER — Telehealth: Payer: Self-pay

## 2023-04-11 ENCOUNTER — Other Ambulatory Visit

## 2023-04-11 NOTE — Telephone Encounter (Signed)
 Spoke with patient in regards to appt today.  Patient states that he wants to cancel appt for lab and injection because he is going to PCP tomorrow and "wants to find out exactly what needs to be done." Informed patient that aranesp injection will increase hgb.  He stated he has not heard from insurance about how much the injection will cost so he is hesitant about the injection.  Informed patient I will reach out to financial department again.   Patient stated he needs to reschedule last iron infusion as well but wants to wait until he sees PCP tomorrow. Informed patient to give our office a call after visit with PCP. Patient verbalized understanding.

## 2023-04-12 DIAGNOSIS — Z09 Encounter for follow-up examination after completed treatment for conditions other than malignant neoplasm: Secondary | ICD-10-CM | POA: Diagnosis not present

## 2023-04-16 ENCOUNTER — Other Ambulatory Visit: Payer: Self-pay | Admitting: Medical Oncology

## 2023-04-16 ENCOUNTER — Inpatient Hospital Stay: Attending: Physician Assistant

## 2023-04-16 ENCOUNTER — Encounter: Payer: Self-pay | Admitting: Physician Assistant

## 2023-04-16 ENCOUNTER — Telehealth: Payer: Self-pay | Admitting: Medical Oncology

## 2023-04-16 ENCOUNTER — Telehealth: Payer: Self-pay

## 2023-04-16 DIAGNOSIS — D509 Iron deficiency anemia, unspecified: Secondary | ICD-10-CM

## 2023-04-16 DIAGNOSIS — R531 Weakness: Secondary | ICD-10-CM | POA: Diagnosis not present

## 2023-04-16 DIAGNOSIS — N179 Acute kidney failure, unspecified: Secondary | ICD-10-CM | POA: Diagnosis not present

## 2023-04-16 DIAGNOSIS — R001 Bradycardia, unspecified: Secondary | ICD-10-CM | POA: Diagnosis not present

## 2023-04-16 LAB — CBC WITH DIFFERENTIAL (CANCER CENTER ONLY)
Abs Immature Granulocytes: 0.04 10*3/uL (ref 0.00–0.07)
Basophils Absolute: 0 10*3/uL (ref 0.0–0.1)
Basophils Relative: 0 %
Eosinophils Absolute: 0 10*3/uL (ref 0.0–0.5)
Eosinophils Relative: 0 %
HCT: 26.7 % — ABNORMAL LOW (ref 39.0–52.0)
Hemoglobin: 8.9 g/dL — ABNORMAL LOW (ref 13.0–17.0)
Immature Granulocytes: 0 %
Lymphocytes Relative: 2 %
Lymphs Abs: 0.2 10*3/uL — ABNORMAL LOW (ref 0.7–4.0)
MCH: 29.8 pg (ref 26.0–34.0)
MCHC: 33.3 g/dL (ref 30.0–36.0)
MCV: 89.3 fL (ref 80.0–100.0)
Monocytes Absolute: 0.4 10*3/uL (ref 0.1–1.0)
Monocytes Relative: 4 %
Neutro Abs: 10.8 10*3/uL — ABNORMAL HIGH (ref 1.7–7.7)
Neutrophils Relative %: 94 %
Platelet Count: 177 10*3/uL (ref 150–400)
RBC: 2.99 MIL/uL — ABNORMAL LOW (ref 4.22–5.81)
RDW: 20.9 % — ABNORMAL HIGH (ref 11.5–15.5)
WBC Count: 11.5 10*3/uL — ABNORMAL HIGH (ref 4.0–10.5)
nRBC: 0.3 % — ABNORMAL HIGH (ref 0.0–0.2)

## 2023-04-16 LAB — SAMPLE TO BLOOD BANK

## 2023-04-16 NOTE — Telephone Encounter (Signed)
 Spoke with patient and scheduled lab visit today due to symptoms of tiredness.

## 2023-04-16 NOTE — Telephone Encounter (Signed)
 Pt given results of hgb and instructed to keep iron infusion on Thursday .

## 2023-04-16 NOTE — Telephone Encounter (Signed)
 No energy , weak, no appetite. Dr Gloria Lares told him to call Dr. Marguerita Shih.  Aranesp auth pending.  Lab appt today .

## 2023-04-18 ENCOUNTER — Emergency Department (HOSPITAL_COMMUNITY)

## 2023-04-18 ENCOUNTER — Inpatient Hospital Stay (HOSPITAL_COMMUNITY)
Admission: EM | Admit: 2023-04-18 | Discharge: 2023-05-02 | DRG: 682 | Disposition: E | Attending: Internal Medicine | Admitting: Internal Medicine

## 2023-04-18 ENCOUNTER — Inpatient Hospital Stay (HOSPITAL_COMMUNITY)

## 2023-04-18 ENCOUNTER — Other Ambulatory Visit: Payer: Self-pay

## 2023-04-18 ENCOUNTER — Encounter (HOSPITAL_COMMUNITY): Payer: Self-pay

## 2023-04-18 ENCOUNTER — Ambulatory Visit

## 2023-04-18 DIAGNOSIS — I341 Nonrheumatic mitral (valve) prolapse: Secondary | ICD-10-CM | POA: Diagnosis present

## 2023-04-18 DIAGNOSIS — I129 Hypertensive chronic kidney disease with stage 1 through stage 4 chronic kidney disease, or unspecified chronic kidney disease: Secondary | ICD-10-CM | POA: Diagnosis not present

## 2023-04-18 DIAGNOSIS — E869 Volume depletion, unspecified: Secondary | ICD-10-CM | POA: Diagnosis not present

## 2023-04-18 DIAGNOSIS — R64 Cachexia: Secondary | ICD-10-CM | POA: Diagnosis present

## 2023-04-18 DIAGNOSIS — I442 Atrioventricular block, complete: Secondary | ICD-10-CM | POA: Diagnosis present

## 2023-04-18 DIAGNOSIS — Z4682 Encounter for fitting and adjustment of non-vascular catheter: Secondary | ICD-10-CM | POA: Diagnosis not present

## 2023-04-18 DIAGNOSIS — E785 Hyperlipidemia, unspecified: Secondary | ICD-10-CM | POA: Diagnosis present

## 2023-04-18 DIAGNOSIS — L89159 Pressure ulcer of sacral region, unspecified stage: Secondary | ICD-10-CM

## 2023-04-18 DIAGNOSIS — E8729 Other acidosis: Secondary | ICD-10-CM

## 2023-04-18 DIAGNOSIS — R001 Bradycardia, unspecified: Secondary | ICD-10-CM | POA: Diagnosis not present

## 2023-04-18 DIAGNOSIS — Z66 Do not resuscitate: Secondary | ICD-10-CM | POA: Diagnosis not present

## 2023-04-18 DIAGNOSIS — I959 Hypotension, unspecified: Secondary | ICD-10-CM | POA: Diagnosis not present

## 2023-04-18 DIAGNOSIS — I130 Hypertensive heart and chronic kidney disease with heart failure and stage 1 through stage 4 chronic kidney disease, or unspecified chronic kidney disease: Secondary | ICD-10-CM | POA: Diagnosis present

## 2023-04-18 DIAGNOSIS — D649 Anemia, unspecified: Secondary | ICD-10-CM | POA: Diagnosis not present

## 2023-04-18 DIAGNOSIS — N179 Acute kidney failure, unspecified: Principal | ICD-10-CM | POA: Diagnosis present

## 2023-04-18 DIAGNOSIS — M109 Gout, unspecified: Secondary | ICD-10-CM | POA: Diagnosis not present

## 2023-04-18 DIAGNOSIS — Z85828 Personal history of other malignant neoplasm of skin: Secondary | ICD-10-CM

## 2023-04-18 DIAGNOSIS — R531 Weakness: Secondary | ICD-10-CM | POA: Diagnosis not present

## 2023-04-18 DIAGNOSIS — D509 Iron deficiency anemia, unspecified: Secondary | ICD-10-CM | POA: Diagnosis not present

## 2023-04-18 DIAGNOSIS — K219 Gastro-esophageal reflux disease without esophagitis: Secondary | ICD-10-CM | POA: Diagnosis present

## 2023-04-18 DIAGNOSIS — D696 Thrombocytopenia, unspecified: Secondary | ICD-10-CM | POA: Diagnosis not present

## 2023-04-18 DIAGNOSIS — Z96653 Presence of artificial knee joint, bilateral: Secondary | ICD-10-CM | POA: Diagnosis present

## 2023-04-18 DIAGNOSIS — E43 Unspecified severe protein-calorie malnutrition: Secondary | ICD-10-CM | POA: Insufficient documentation

## 2023-04-18 DIAGNOSIS — R54 Age-related physical debility: Secondary | ICD-10-CM | POA: Diagnosis present

## 2023-04-18 DIAGNOSIS — E440 Moderate protein-calorie malnutrition: Secondary | ICD-10-CM | POA: Diagnosis present

## 2023-04-18 DIAGNOSIS — R680 Hypothermia, not associated with low environmental temperature: Secondary | ICD-10-CM | POA: Diagnosis present

## 2023-04-18 DIAGNOSIS — R0902 Hypoxemia: Secondary | ICD-10-CM | POA: Diagnosis not present

## 2023-04-18 DIAGNOSIS — Z8262 Family history of osteoporosis: Secondary | ICD-10-CM

## 2023-04-18 DIAGNOSIS — N1832 Chronic kidney disease, stage 3b: Secondary | ICD-10-CM | POA: Diagnosis present

## 2023-04-18 DIAGNOSIS — Z7982 Long term (current) use of aspirin: Secondary | ICD-10-CM | POA: Diagnosis not present

## 2023-04-18 DIAGNOSIS — Z79899 Other long term (current) drug therapy: Secondary | ICD-10-CM

## 2023-04-18 DIAGNOSIS — Z9109 Other allergy status, other than to drugs and biological substances: Secondary | ICD-10-CM

## 2023-04-18 DIAGNOSIS — J449 Chronic obstructive pulmonary disease, unspecified: Secondary | ICD-10-CM | POA: Diagnosis not present

## 2023-04-18 DIAGNOSIS — L89153 Pressure ulcer of sacral region, stage 3: Secondary | ICD-10-CM | POA: Diagnosis present

## 2023-04-18 DIAGNOSIS — Z8249 Family history of ischemic heart disease and other diseases of the circulatory system: Secondary | ICD-10-CM

## 2023-04-18 DIAGNOSIS — R570 Cardiogenic shock: Secondary | ICD-10-CM | POA: Diagnosis not present

## 2023-04-18 DIAGNOSIS — J9601 Acute respiratory failure with hypoxia: Secondary | ICD-10-CM | POA: Diagnosis not present

## 2023-04-18 DIAGNOSIS — E875 Hyperkalemia: Secondary | ICD-10-CM | POA: Diagnosis not present

## 2023-04-18 DIAGNOSIS — Z87891 Personal history of nicotine dependence: Secondary | ICD-10-CM | POA: Diagnosis not present

## 2023-04-18 DIAGNOSIS — I2510 Atherosclerotic heart disease of native coronary artery without angina pectoris: Secondary | ICD-10-CM | POA: Diagnosis present

## 2023-04-18 DIAGNOSIS — R579 Shock, unspecified: Secondary | ICD-10-CM | POA: Diagnosis not present

## 2023-04-18 DIAGNOSIS — I4891 Unspecified atrial fibrillation: Secondary | ICD-10-CM

## 2023-04-18 DIAGNOSIS — M1990 Unspecified osteoarthritis, unspecified site: Secondary | ICD-10-CM | POA: Diagnosis present

## 2023-04-18 DIAGNOSIS — Z6821 Body mass index (BMI) 21.0-21.9, adult: Secondary | ICD-10-CM

## 2023-04-18 DIAGNOSIS — Z87442 Personal history of urinary calculi: Secondary | ICD-10-CM

## 2023-04-18 DIAGNOSIS — N400 Enlarged prostate without lower urinary tract symptoms: Secondary | ICD-10-CM | POA: Diagnosis present

## 2023-04-18 DIAGNOSIS — Z801 Family history of malignant neoplasm of trachea, bronchus and lung: Secondary | ICD-10-CM

## 2023-04-18 DIAGNOSIS — M1A9XX0 Chronic gout, unspecified, without tophus (tophi): Secondary | ICD-10-CM | POA: Diagnosis present

## 2023-04-18 DIAGNOSIS — Q246 Congenital heart block: Secondary | ICD-10-CM | POA: Diagnosis not present

## 2023-04-18 DIAGNOSIS — E872 Acidosis, unspecified: Secondary | ICD-10-CM | POA: Diagnosis not present

## 2023-04-18 DIAGNOSIS — N183 Chronic kidney disease, stage 3 unspecified: Secondary | ICD-10-CM | POA: Diagnosis not present

## 2023-04-18 DIAGNOSIS — Z515 Encounter for palliative care: Secondary | ICD-10-CM

## 2023-04-18 LAB — BASIC METABOLIC PANEL WITH GFR
Anion gap: 13 (ref 5–15)
BUN: 102 mg/dL — ABNORMAL HIGH (ref 8–23)
CO2: 14 mmol/L — ABNORMAL LOW (ref 22–32)
Calcium: 8.4 mg/dL — ABNORMAL LOW (ref 8.9–10.3)
Chloride: 109 mmol/L (ref 98–111)
Creatinine, Ser: 5.17 mg/dL — ABNORMAL HIGH (ref 0.61–1.24)
GFR, Estimated: 10 mL/min — ABNORMAL LOW (ref >60)
Glucose, Bld: 119 mg/dL — ABNORMAL HIGH (ref 70–99)
Potassium: 6.1 mmol/L — ABNORMAL HIGH (ref 3.5–5.1)
Sodium: 136 mmol/L (ref 135–145)

## 2023-04-18 LAB — CBC
HCT: 23.1 % — ABNORMAL LOW (ref 39.0–52.0)
Hemoglobin: 7.4 g/dL — ABNORMAL LOW (ref 13.0–17.0)
MCH: 29.2 pg (ref 26.0–34.0)
MCHC: 32 g/dL (ref 30.0–36.0)
MCV: 91.3 fL (ref 80.0–100.0)
Platelets: 129 10*3/uL — ABNORMAL LOW (ref 150–400)
RBC: 2.53 MIL/uL — ABNORMAL LOW (ref 4.22–5.81)
RDW: 21.1 % — ABNORMAL HIGH (ref 11.5–15.5)
WBC: 7.4 10*3/uL (ref 4.0–10.5)
nRBC: 0.7 % — ABNORMAL HIGH (ref 0.0–0.2)

## 2023-04-18 LAB — MRSA NEXT GEN BY PCR, NASAL: MRSA by PCR Next Gen: NOT DETECTED

## 2023-04-18 LAB — POTASSIUM
Potassium: 6.4 mmol/L (ref 3.5–5.1)
Potassium: 5.6 mmol/L — ABNORMAL HIGH (ref 3.5–5.1)
Potassium: 5.6 mmol/L — ABNORMAL HIGH (ref 3.5–5.1)

## 2023-04-18 LAB — LACTIC ACID, PLASMA
Lactic Acid, Venous: 2.5 mmol/L (ref 0.5–1.9)
Lactic Acid, Venous: 5.7 mmol/L (ref 0.5–1.9)

## 2023-04-18 LAB — ECHOCARDIOGRAM COMPLETE
AR max vel: 2.11 cm2
AV Area VTI: 2.11 cm2
AV Area mean vel: 1.91 cm2
AV Mean grad: 6 mmHg
AV Peak grad: 12 mmHg
Ao pk vel: 1.73 m/s
Area-P 1/2: 3.1 cm2
Height: 72 in
S' Lateral: 3.7 cm
Weight: 2560.86 [oz_av]

## 2023-04-18 LAB — RENAL FUNCTION PANEL
Albumin: 2.1 g/dL — ABNORMAL LOW (ref 3.5–5.0)
Anion gap: 16 — ABNORMAL HIGH (ref 5–15)
BUN: 105 mg/dL — ABNORMAL HIGH (ref 8–23)
CO2: 11 mmol/L — ABNORMAL LOW (ref 22–32)
Calcium: 8.4 mg/dL — ABNORMAL LOW (ref 8.9–10.3)
Chloride: 111 mmol/L (ref 98–111)
Creatinine, Ser: 5.42 mg/dL — ABNORMAL HIGH (ref 0.61–1.24)
GFR, Estimated: 9 mL/min — ABNORMAL LOW (ref >60)
Glucose, Bld: 122 mg/dL — ABNORMAL HIGH (ref 70–99)
Phosphorus: 4.6 mg/dL (ref 2.5–4.6)
Potassium: 5.6 mmol/L — ABNORMAL HIGH (ref 3.5–5.1)
Sodium: 138 mmol/L (ref 135–145)

## 2023-04-18 LAB — HEPATIC FUNCTION PANEL
ALT: 18 U/L (ref 0–44)
AST: 34 U/L (ref 15–41)
Albumin: 2.3 g/dL — ABNORMAL LOW (ref 3.5–5.0)
Alkaline Phosphatase: 62 U/L (ref 38–126)
Bilirubin, Direct: <0.1 mg/dL (ref 0.0–0.2)
Total Bilirubin: 0.3 mg/dL (ref 0.0–1.2)
Total Protein: 4.8 g/dL — ABNORMAL LOW (ref 6.5–8.1)

## 2023-04-18 LAB — MAGNESIUM: Magnesium: 1.8 mg/dL (ref 1.7–2.4)

## 2023-04-18 LAB — TROPONIN I (HIGH SENSITIVITY)
Troponin I (High Sensitivity): 24 ng/L — ABNORMAL HIGH (ref <18)
Troponin I (High Sensitivity): 23 ng/L — ABNORMAL HIGH (ref <18)

## 2023-04-18 LAB — TSH: TSH: 4.891 u[IU]/mL — ABNORMAL HIGH (ref 0.350–4.500)

## 2023-04-18 LAB — T4, FREE: Free T4: 0.93 ng/dL (ref 0.61–1.12)

## 2023-04-18 MED ORDER — POLYETHYLENE GLYCOL 3350 17 G PO PACK: 17.0000 g | PACK | Freq: Every day | ORAL | Status: DC | PRN

## 2023-04-18 MED ORDER — DOCUSATE SODIUM 100 MG PO CAPS: 100.0000 mg | ORAL_CAPSULE | Freq: Two times a day (BID) | ORAL | Status: DC | PRN

## 2023-04-18 MED ORDER — DEXTROSE 50 % IV SOLN
1.0000 | Freq: Once | INTRAVENOUS | Status: AC
  Administered 2023-04-18: 50 mL via INTRAVENOUS
  Filled 2023-04-18: qty 50

## 2023-04-18 MED ORDER — SODIUM ZIRCONIUM CYCLOSILICATE 10 G PO PACK
10.0000 g | PACK | Freq: Three times a day (TID) | ORAL | Status: DC
  Administered 2023-04-18: 10 g via ORAL
  Filled 2023-04-18 (×2): qty 1

## 2023-04-18 MED ORDER — IRON SUCROSE 20 MG/ML IV SOLN
200.0000 mg | Freq: Once | INTRAVENOUS | Status: DC
Start: 2023-04-18 — End: 2023-04-18

## 2023-04-18 MED ORDER — SODIUM ZIRCONIUM CYCLOSILICATE 10 G PO PACK
10.0000 g | PACK | Freq: Three times a day (TID) | ORAL | Status: DC
  Administered 2023-04-18 – 2023-04-19 (×2): 10 g
  Filled 2023-04-18 (×2): qty 1

## 2023-04-18 MED ORDER — CALCIUM GLUCONATE-NACL 1-0.675 GM/50ML-% IV SOLN
1.0000 g | Freq: Once | INTRAVENOUS | Status: AC
  Administered 2023-04-18: 1000 mg via INTRAVENOUS
  Filled 2023-04-18: qty 50

## 2023-04-18 MED ORDER — VANCOMYCIN HCL 1500 MG/300ML IV SOLN
1500.0000 mg | Freq: Once | INTRAVENOUS | Status: DC
  Filled 2023-04-18: qty 300

## 2023-04-18 MED ORDER — FERROUS SULFATE 325 (65 FE) MG PO TABS
325.0000 mg | ORAL_TABLET | Freq: Two times a day (BID) | ORAL | Status: DC
  Administered 2023-04-19: 325 mg via ORAL
  Filled 2023-04-18 (×2): qty 1

## 2023-04-18 MED ORDER — DOPAMINE-DEXTROSE 3.2-5 MG/ML-% IV SOLN
2.5000 ug/kg/min | INTRAVENOUS | Status: DC
  Filled 2023-04-18: qty 250

## 2023-04-18 MED ORDER — ALBUTEROL SULFATE (2.5 MG/3ML) 0.083% IN NEBU
10.0000 mg | INHALATION_SOLUTION | Freq: Once | RESPIRATORY_TRACT | Status: AC
  Administered 2023-04-18: 10 mg via RESPIRATORY_TRACT
  Filled 2023-04-18: qty 12

## 2023-04-18 MED ORDER — PIPERACILLIN-TAZOBACTAM IN DEX 2-0.25 GM/50ML IV SOLN
2.2500 g | Freq: Once | INTRAVENOUS | Status: DC
  Filled 2023-04-18: qty 50

## 2023-04-18 MED ORDER — SODIUM BICARBONATE 8.4 % IV SOLN
50.0000 meq | Freq: Once | INTRAVENOUS | Status: AC
  Administered 2023-04-18: 50 meq via INTRAVENOUS
  Filled 2023-04-18: qty 50

## 2023-04-18 MED ORDER — FUROSEMIDE 10 MG/ML IJ SOLN: 40.0000 mg | Freq: Once | INTRAMUSCULAR | Status: DC

## 2023-04-18 MED ORDER — SODIUM CHLORIDE 0.9 % IV BOLUS
500.0000 mL | Freq: Once | INTRAVENOUS | Status: AC
  Administered 2023-04-18: 500 mL via INTRAVENOUS

## 2023-04-18 MED ORDER — LACTATED RINGERS IV BOLUS: 1000.0000 mL | Freq: Once | INTRAVENOUS | Status: DC

## 2023-04-18 MED ORDER — INSULIN ASPART 100 UNIT/ML IV SOLN
5.0000 [IU] | Freq: Once | INTRAVENOUS | Status: AC
  Administered 2023-04-18: 5 [IU] via INTRAVENOUS

## 2023-04-18 MED ORDER — SODIUM CHLORIDE 0.9 % IV BOLUS
1000.0000 mL | Freq: Once | INTRAVENOUS | Status: AC
  Administered 2023-04-18: 1000 mL via INTRAVENOUS

## 2023-04-18 MED ORDER — SODIUM BICARBONATE 8.4 % IV SOLN
INTRAVENOUS | Status: DC
  Filled 2023-04-18: qty 1000
  Filled 2023-04-18: qty 150
  Filled 2023-04-18 (×2): qty 1000
  Filled 2023-04-18: qty 150

## 2023-04-18 MED ORDER — FAMOTIDINE 20 MG PO TABS
10.0000 mg | ORAL_TABLET | Freq: Every day | ORAL | Status: DC
  Administered 2023-04-18: 10 mg

## 2023-04-18 MED ORDER — ACETAMINOPHEN 325 MG PO TABS: 650.0000 mg | ORAL_TABLET | Freq: Once | ORAL | Status: DC

## 2023-04-18 MED ORDER — FAMOTIDINE 20 MG PO TABS
10.0000 mg | ORAL_TABLET | Freq: Every day | ORAL | Status: DC
  Filled 2023-04-18: qty 1

## 2023-04-18 NOTE — ED Triage Notes (Signed)
 Patient bib [facility] from home with complaints of weakness and bradycardia. EMS reports patient was in a bradycardic afib rhythm/ Patient reports the weakness and poor PO intake over the past week.   Patient is A&Ox4 but slow to respond.  PMH: HTN, and currently on ABX for cellulitis in the right leg.

## 2023-04-18 NOTE — ED Notes (Signed)
 Bear hugger placed on patient due to temp of 90. EDP Young notified

## 2023-04-18 NOTE — H&P (Addendum)
 Cardiology Admission History and Physical   Patient ID: [name] MRN: [id number]; DOB: 1925-08-15   Admission date: 04/18/2023  PCP:  Georgianne Fick, MD   [facility] Providers Cardiologist:  Nanetta Batty, MD   {  Chief Complaint:  bradycardia, weakness   Patient Profile:   [name] is a 89 y.o. male with CAD, severe MR 2/2 to MVP, hypertension, hyperlipidemia, CKD stage [name], gout, chronic anemia who is being seen 04/18/2023 for the evaluation of bradycardia.  History of Present Illness:   [name] has past medical history as stated above.  He presented to [facility], ED via EMS 04/18/2023 complaining of weakness and bradycardia.  EMS reports that the patient was in bradycardic atrial fibrillation.  His EKG in the ED shows high-grade heart block, heart rate 31.  Prior EKG from 04/05/2023 shows sinus rhythm, with first-degree AV block, heart rate 61.  He was recently in [facility] for necrotizing fasciitis of the right LE.  He was admitted from 4/3-04/08/2023 and was discharged on antibiotics.  His wife said since discharging he was getting around a little bit worse due to the cellulitis on his leg.  But overall he was doing okay until this past Monday 04/05/2023.  She reports that the patient had significantly decreased his PO intake.  Not really eating or drinking anything of substance.  She states she was unsure if this was just him feeling poorly from his antibiotics or something else.  She also notes slowed mentation and a little bit of altered mental status.  Patient is slow to respond however is able to answer questions appropriately.  Him and his wife agree that his CODE STATUS is DNR/DNI.  However they would like to use a transcutaneous pacemaker in the interim if necessary.  Vital signs on arrival to the ED show temperature of 90 F, BP 115/42, heart rate 20-40s.  CBC showed no WBC, however he did have a white count of 11.5 on 04/16/2023.   Hemoglobin of 7.4, down from 8.9 two days ago.  BMP shows elevated potassium at 6.1, creatinine elevated at 5.17 compared to 1.94 just 11 days ago.  Initial troponin elevated at 24, pending second result.   Instructed ED staff to place the patient on transcutaneous pacemaker.  Patient captured at 38 on pacer and was placed on fixed pacing at 70 bpm. After speaking with MD, patient has pads left in place but is not actively pacing.   Awaiting additional workup including: Lactic acid, blood cultures, thyroid panel, echocardiogram, CXR, UA   Past Medical History:  Diagnosis Date   Anemia    low hemoglobin   Arthritis    AV (angiodysplasia malformation of colon)    Benign prostatic hypertrophy    Cancer (HCC)    basal cell cancer   Chronic kidney disease    elevated Cr, BUN, Dr. Caryn Section manages   COPD (chronic obstructive pulmonary disease) (HCC)    Coronary artery disease    GERD (gastroesophageal reflux disease)    Gout    History of kidney stones    Hyperlipidemia    Hypertension    Influenza    February 2019   Mitral regurgitation    Mitral valve prolapse    Pneumonia 1970's   PONV (postoperative nausea and vomiting)    1st knee replacement   Past Surgical History:  Procedure Laterality Date   ABDOMINAL ANGIOGRAM  04/30/2011   Procedure: ABDOMINAL ANGIOGRAM;  Surgeon: Christiane Ha  Erlene Quan, MD;  Location: [facility] CATH LAB;  Service: Cardiovascular;;   CARDIAC CATHETERIZATION  04/2011   x2   CARDIAC CATHETERIZATION  08/2009   CATARACT EXTRACTION  2011   bilateral   COLONOSCOPY  05/04/2011   Procedure: COLONOSCOPY;  Surgeon: Theda Belfast, MD;  Location: [facility] ENDOSCOPY;  Service: Endoscopy;  Laterality: N/A;   COLONOSCOPY N/A 07/16/2013   Procedure: COLONOSCOPY;  Surgeon: Theda Belfast, MD;  Location: [facility] ENDOSCOPY;  Service: Endoscopy;  Laterality: N/A;   COLONOSCOPY WITH PROPOFOL N/A 06/20/2017   Procedure: COLONOSCOPY WITH PROPOFOL;  Surgeon: Jeani Hawking, MD;  Location: [facility] ENDOSCOPY;   Service: Endoscopy;  Laterality: N/A;   CORONARY ANGIOGRAM  04/30/2011   Procedure: CORONARY ANGIOGRAM;  Surgeon: Runell Gess, MD;  Location: [facility] CATH LAB;  Service: Cardiovascular;;   ESOPHAGOGASTRODUODENOSCOPY  05/04/2011   Procedure: ESOPHAGOGASTRODUODENOSCOPY (EGD);  Surgeon: Theda Belfast, MD;  Location: [facility] ENDOSCOPY;  Service: Endoscopy;  Laterality: N/A;   EYE SURGERY     bilateral cataracts removed   HERNIA REPAIR  1999&2000   bilateral   LOBECTOMY  1980   left lower lobectomy for benign tumor   OPEN REDUCTION INTERNAL FIXATION (ORIF) DISTAL RADIAL FRACTURE Left 03/19/2017   Procedure: OPEN REDUCTION INTERNAL FIXATION (ORIF) DISTAL RADIAL FRACTURE;  Surgeon: Jodi Geralds, MD;  Location: [facility] OR;  Service: Orthopedics;  Laterality: Left;  AXILLARY BLOCK   POLYPECTOMY  06/20/2017   Procedure: POLYPECTOMY;  Surgeon: Jeani Hawking, MD;  Location: [facility] ENDOSCOPY;  Service: Endoscopy;;   REPLACEMENT TOTAL KNEE  2005   right   RIGHT HEART CATHETERIZATION  04/30/2011   Procedure: RIGHT HEART CATH;  Surgeon: Runell Gess, MD;  Location[facility] CATH LAB;  Service: Cardiovascular;;   TEE WITHOUT CARDIOVERSION  04/30/2011   Procedure: TRANSESOPHAGEAL ECHOCARDIOGRAM (TEE);  Surgeon: Thurmon Fair, MD;  Location: [facility] ENDOSCOPY;  Service: Cardiovascular;  Laterality: N/A;  3D TEE/ patient will be admitted the day before test , therefo patient will be a inpatient the day of TEE   TOTAL KNEE ARTHROPLASTY Left 02/15/2012   Procedure: TOTAL KNEE ARTHROPLASTY;  Surgeon: Harvie Junior, MD;  Location: [facility] OR;  Service: Orthopedics;  Laterality: Left;    Medications Prior to Admission: Prior to Admission medications   Medication Sig Start Date End Date Taking? Authorizing Provider  acetaminophen (TYLENOL) 325 MG tablet Take 650 mg by mouth every 6 (six) hours as needed for mild pain or headache.    [provider]  allopurinol (ZYLOPRIM) 300 MG tablet Take 150 mg by mouth daily.     [provider]  aspirin EC 81 MG tablet Take 81 mg by mouth See admin instructions. Take 81 mg by mouth on Mondays and Wednesdays- swallow whole    [provider]  atorvastatin (LIPITOR) 10 MG tablet TAKE 1 TABLET (10 MG TOTAL) BY MOUTH DAILY AT 6 PM. 05/17/22   Runell Gess, MD  diltiazem (CARDIZEM CD) 120 MG 24 hr capsule Take 1 capsule (120 mg total) by mouth daily. Please call [phone] to schedule a  July appointment for future refills. Thank you. Patient taking differently: Take 120 mg by mouth in the morning. 02/12/23 05/13/23  Runell Gess, MD  ketoconazole (NIZORAL) 2 % cream Apply 1 Application topically See admin instructions. Apply to affected area of the buttocks region 2 times a day as directed 04/02/23   [provider]  leptospermum manuka honey (MEDIHONEY) PSTE paste Apply 1 Application topically daily. 04/09/23  [name], MD  linezolid (ZYVOX) 600 MG tablet Take 1 tablet (600 mg total) by mouth every 12 (twelve) hours for 10 days. 04/08/23 04/18/23  Odette Fraction, MD  liver oil-zinc oxide (DESITIN) 40 % ointment Apply topically 3 (three) times daily. 04/08/23   [name], MD  omeprazole (PRILOSEC OTC) 20 MG tablet Take 20 mg by mouth every other day.    [provider]  OVER THE COUNTER MEDICATION Place 1 drop into both eyes See admin instructions. Thera Tears- Instill 1 drop into both eyes three times a day as needed for dryness    [provider]  polyethylene glycol (MIRALAX / GLYCOLAX) packet Take 17 g by mouth daily as needed for mild constipation or moderate constipation.    [provider]  UNKNOWN TO PATIENT Place 1 drop into both eyes See admin instructions. Unnamed allergy relief eye drops- Instill 1 drop into both eyes three times a day as needed for itching and/or allergies    [provider]    Allergies:    Allergies  Allergen Reactions   Sodium Bicarbonate Other (See Comments)    Made the  patient feel "unwell, not normal."   Social History:   Social History   Socioeconomic History   Marital status: Married    Spouse name: Not on file   Number of children: Not on file   Years of education: Not on file   Highest education level: Not on file  Occupational History   Not on file  Tobacco Use   Smoking status: Former    Current packs/day: 0.00    Types: Cigarettes    Start date: 02/08/1941    Quit date: 02/08/1961    Years since quitting: 62.2   Smokeless tobacco: Former    Quit date: 02/08/1961  Vaping Use   Vaping status: Never Used  Substance and Sexual Activity   Alcohol use: Yes    Alcohol/week: 7.0 standard drinks of alcohol    Types: 7 Standard drinks or equivalent per week    Comment: 2ozs Financial controller daily   Drug use: No   Sexual activity: Not Currently  Other Topics Concern   Not on file  Social History Narrative   Not on file   Social Drivers of Health   Financial Resource Strain: Not on file  Food Insecurity: No Food Insecurity (04/09/2023)   Hunger Vital Sign    Worried About Running Out of Food in the Last Year: Never true    Ran Out of Food in the Last Year: Never true  Transportation Needs: No Transportation Needs (04/09/2023)   PRAPARE - Administrator, Civil Service (Medical): No    Lack of Transportation (Non-Medical): No  Physical Activity: Not on file  Stress: Not on file  Social Connections: Moderately Isolated (04/04/2023)   Social Connection and Isolation Panel [NHANES]    Frequency of Communication with Friends and Family: Once a week    Frequency of Social Gatherings with Friends and Family: Once a week    Attends Religious Services: 1 to 4 times per year    Active Member of [employer] or Organizations: No    Attends Banker Meetings: Never    Marital Status: Married  Catering manager Violence: Not At Risk (04/09/2023)   Humiliation, Afraid, Rape, and Kick questionnaire    Fear of Current or Ex-Partner: No    Emotionally  Abused: No    Physically Abused: No    Sexually Abused: No  Family History:   The patient's family history includes Cancer in his paternal grandfather; Heart attack in his maternal grandfather; Hypertension in his mother; Irregular heart beat in his brother; Lung cancer in his father; Osteoporosis in his mother.    ROS:  Please see the history of present illness.  All other ROS reviewed and negative.     Physical Exam/Data:   Vitals:   04/18/23 1200 04/18/23 1215 04/18/23 1230 04/18/23 1243  BP: (!) 115/40 (!) 113/39 (!) 115/42   Pulse: (!) 34 (!) 47 (!) 31   Resp: 10 18 10    Temp:    (!) 90 F (32.2 C)  TempSrc:    Rectal  SpO2: 98% 96% 99%   Weight:      Height:       Intake/Output Summary (Last 24 hours) at 04/18/2023 1405 Last data filed at 04/18/2023 1153 Gross per 24 hour  Intake 500 ml  Output --  Net 500 ml      04/18/2023   11:58 AM 04/07/2023    9:00 AM 04/04/2023   11:56 AM  Last 3 Weights  Weight (lbs) 160 lb 0.9 oz 160 lb 176 lb 12.9 oz  Weight (kg) 72.6 kg 72.576 kg 80.2 kg     Body mass index is 21.71 kg/m.   General:  not in acute distress, slow to respond, improved with pacing, on room air, wife present in room HEENT: normal Neck: no JVD Vascular: No carotid bruits; Distal pulses 2+ bilaterally   Cardiac:  normal S1, S2; RRR; no murmur  Lungs:  clear to auscultation bilaterally, no wheezing, rhonchi or rales  Abd: soft, nontender, no hepatomegaly  Ext: no edema, erythematous discoloration of bilateral legs, scattered ecchymosis, healing ulcer present on right leg Musculoskeletal:  No deformities Skin: warm and dry  Neuro: slow to respond, improved with pacing  EKG:  The ECG that was done 04/18/2023 was personally reviewed and demonstrates third-degree heart block, heart rate 31  Relevant CV Studies: Echocardiogram, 04/18/2023 Ordered, pending results   Laboratory Data:  High Sensitivity Troponin:   Recent Labs  Lab 04/18/23 1157   TROPONINIHS 24*      Chemistry Recent Labs  Lab 04/18/23 1157  NA 136  K 6.1*  CL 109  CO2 14*  GLUCOSE 119*  BUN 102*  CREATININE 5.17*  CALCIUM 8.4*  GFRNONAA 10*  ANIONGAP 13    No results for input(s): "PROT", "ALBUMIN", "AST", "ALT", "ALKPHOS", "BILITOT" in the last 168 hours. Lipids No results for input(s): "CHOL", "TRIG", "HDL", "LABVLDL", "LDLCALC", "CHOLHDL" in the last 168 hours. Hematology Recent Labs  Lab 04/16/23 1352 04/18/23 1157  WBC 11.5* 7.4  RBC 2.99* 2.53*  HGB 8.9* 7.4*  HCT 26.7* 23.1*  MCV 89.3 91.3  MCH 29.8 29.2  MCHC 33.3 32.0  RDW 20.9* 21.1*  PLT 177 129*   Thyroid No results for input(s): "TSH", "FREET4" in the last 168 hours. BNPNo results for input(s): "BNP", "PROBNP" in the last 168 hours.  DDimer No results for input(s): "DDIMER" in the last 168 hours.  Radiology/Studies:  No results found.  Assessment and Plan:   Complete heart block Symptomatic bradycardia Shock  HR when discharging from was in the hospital seem to be 60-70s Patient's wife reports he was doing fairly okay after being discharged in the hospital on 4/7 This past Monday 4/14 patient's wife noted he became profoundly weak, not wanting to eat or drink anything of significance Presented to the hospital today for profound weakness  unable to go to the scheduled appointment this morning Last dose of diltiazem was 4/16 Placed on pacer pads, captured at 38, briefly paced at 70 bpm but taken off transcutaneous pacing  Patient is currently DNR/DNI however him and his wife are okay with transcutaneous pacing if necessary  Ordered echocardiogram, pending results  Recent necrotizing fasciitis of right lower extremity Recent hypothermia  Patient was recently hospitalized from 4/3-04/08/2023 Discharged on Linezolid x 10 days, wife reports that they had 1 remaining day of antibiotics that he did not take Patient was also noted to be hypothermic while at [facility],  improved with warmed IV fluids Awaiting blood cultures, UA, CXR, echocardiogram, lactic acid level   History of hypertension Hypotension History of hypertension however hypotensive on presentation Home meds: Diltiazem 120 mg daily, last dose 4/16 Most recent BP 115/42  Hyperlipidemia Home meds: Lipitor 10 mg daily  History of CAD History of noncritical CAD by catheterization 08/2009 by Dr. Dean Every, noted to have moderate disease in his mid RCA, LAD and ramus branch Patient denies any chest pain Home meds: ASA 81 mg daily, Lipitor 10 mg daily  AKI on CKD stage IIIb Hyperkalemia  Baseline creatinine ~1.6 Creatinine today 5.17 with potassium 6.1   Chronic anemia Baseline hemoglobin ~8 Received recent blood transfusion on April 05, 2023 Hemoglobin today 7.4   Follow up: patient is going to be admitted to CCM due to ongoing metabolic issues and shock presentation. Please refer to their notes for admission details.   Risk Assessment/Risk Scores:   Code Status: Do Not Resuscitate (DNR)  Severity of Illness: The appropriate patient status for this patient is INPATIENT. Inpatient status is judged to be reasonable and necessary in order to provide the required intensity of service to ensure the patient's safety. The patient's presenting symptoms, physical exam findings, and initial radiographic and laboratory data in the context of their chronic comorbidities is felt to place them at high risk for further clinical deterioration. Furthermore, it is not anticipated that the patient will be medically stable for discharge from the hospital within 2 midnights of admission.   * I certify that at the point of admission it is my clinical judgment that the patient will require inpatient hospital care spanning beyond 2 midnights from the point of admission due to high intensity of service, high risk for further deterioration and high frequency of surveillance required.*   For questions or updates,  please contact [facility] Please consult [email or web address] for contact info under     Signed, Jiles Mote, PA-C  04/18/2023 2:05 PM   History and all data above reviewed.  Patient examined.  I agree with the findings as above.  The patient was recently hospitalized with cellulitis home on an biotics.  He gets around per his wife fairly well.  He has chronic medical issues including chronic renal insufficiency, bradycardia, chronic anemia.  Following his recent hospitalization for necrotizing fasciitis he was sent home with Cardizem which had been on before.  He now comes back with perhaps 4 days or so of this profound weakness.  He was not taking his blood pressure his heart rate at home and is found to be in complete heart block.  He is hypothermic.  Systolic blood pressures are in the 114's.  In the ER we did transcutaneous pacing to make sure there was capture and then turn it off just for backup.  Unfortunately labs revealed he has now acute renal insufficiency and elevated lactic acid.  He is hyperkalemic.  He is not describing any chest pressure, neck or arm discomfort that he is actually not short of breath.  He is mentating well.  He has not been having any recent PND or orthopnea.  The patient exam reveals COR: Bradycardia, distant heart sounds,  Lungs: Decreased breath sounds, no wheezing, crackles,  Abd: Positive bowel sounds normal frequency and pitch, bruits, rebound, guarding, Ext 2+ upper pulses, diminished dorsalis and PDS bilateral, healing cellulitis right leg.  Skin breakdown over the sacru  All available labs, radiology testing, previous records reviewed. Agree with documented assessment and plan.  Complete heart block: We were called initially because of the complete heart block.  However, it looks like he is in shock with acute renal insufficiency.  He will be admitted to the ICU to the CCM service for supportive care.  I had a long conversation with his wife.  Patient has a  DNR and would not want want intubation or CPR.  If necessary he would consent to transcutaneous pacing and if felt appropriate temporary dialysis.  At this point we are not placing transvenous pacemaker but will reconsider this over the next hours.  His prognosis is very poor.  I had that discussion with the patient and his wife.  [name]  5:09 PM  04/18/2023

## 2023-04-18 NOTE — ED Notes (Signed)
 X-ray at bedside

## 2023-04-18 NOTE — Consult Note (Signed)
 [facility]  HISTORY AND PHYSICAL  [name] is an 89 y.o. male.    Chief Complaint: weakness  HPI: Pt ia 44M with mult medical problems including HTN, CAD, severe MR 2/2 MVP, HLD, gout, CKD 3, and anemia who is now seen in consultation at the request of Dr Lonzo Candy for eval and recs re: AKI and hyperkalemia.    Pt was recently discharged from [facility] 04/08/23 for cellulitis of the R leg.  Completed 10 day course of linezolid.  Discharge Cr was 1.9.  Initially was doing well then began to have very poor PO intake.  Hardly ate and drank anything according to wife.  Still took meds including diltiazem and lisinopril.  Presented today to ED for not feeling well, weakness.  BUN/Cr up to 102/ 5.17 from 48/ 1.94 on 04/07/23.  K is 6.1-6.4.  was bradycardic to the 30s, briefly transcutaneously paced in ED, then turned off.    In this setting we are asked to see.  Getting renal US on my entering- doesn't look like any gross hydro, urine in bladder.  RT to place albuterol neb.  Wife tells me that maybe 2 episodes of diarrhea but very poor PO intake.    PMH: Past Medical History:  Diagnosis Date   Anemia    low hemoglobin   Arthritis    AV (angiodysplasia malformation of colon)    Benign prostatic hypertrophy    Cancer (HCC)    basal cell cancer   Chronic kidney disease    elevated Cr, BUN, Dr. Caryn Section manages   COPD (chronic obstructive pulmonary disease) [facility])    Coronary artery disease    GERD (gastroesophageal reflux disease)    Gout    History of kidney stones    Hyperlipidemia    Hypertension    Influenza    February 2019   Mitral regurgitation    Mitral valve prolapse    Pneumonia 1970's   PONV (postoperative nausea and vomiting)    1st knee replacement   PSH: Past Surgical History:  Procedure Laterality Date   ABDOMINAL ANGIOGRAM  04/30/2011   Procedure: ABDOMINAL ANGIOGRAM;  Surgeon: Runell Gess, MD;  Location: [facility] CATH LAB;  Service: Cardiovascular;;    CARDIAC CATHETERIZATION  04/2011   x2   CARDIAC CATHETERIZATION  08/2009   CATARACT EXTRACTION  2011   bilateral   COLONOSCOPY  05/04/2011   Procedure: COLONOSCOPY;  Surgeon: Theda Belfast, MD;  Location: [facility] ENDOSCOPY;  Service: Endoscopy;  Laterality: N/A;   COLONOSCOPY N/A 07/16/2013   Procedure: COLONOSCOPY;  Surgeon: Theda Belfast, MD;  Location: [facility] ENDOSCOPY;  Service: Endoscopy;  Laterality: N/A;   COLONOSCOPY WITH PROPOFOL N/A 06/20/2017   Procedure: COLONOSCOPY WITH PROPOFOL;  Surgeon: Jeani Hawking, MD;  Location: [facility] ENDOSCOPY;  Service: Endoscopy;  Laterality: N/A;   CORONARY ANGIOGRAM  04/30/2011   Procedure: CORONARY ANGIOGRAM;  Surgeon: Runell Gess, MD;  Location: [facility] CATH LAB;  Service: Cardiovascular;;   ESOPHAGOGASTRODUODENOSCOPY  05/04/2011   Procedure: ESOPHAGOGASTRODUODENOSCOPY (EGD);  Surgeon: Theda Belfast, MD;  Location: [facility] ENDOSCOPY;  Service: Endoscopy;  Laterality: N/A;   EYE SURGERY     bilateral cataracts removed   HERNIA REPAIR  1999&2000   bilateral   LOBECTOMY  1980   left lower lobectomy for benign tumor   OPEN REDUCTION INTERNAL FIXATION (ORIF) DISTAL RADIAL FRACTURE Left 03/19/2017   Procedure: OPEN REDUCTION INTERNAL FIXATION (ORIF) DISTAL RADIAL FRACTURE;  Surgeon: Jodi Geralds, MD;  Location: [facility]  OR;  Service: Orthopedics;  Laterality: Left;  AXILLARY BLOCK   POLYPECTOMY  06/20/2017   Procedure: POLYPECTOMY;  Surgeon: Jeani Hawking, MD;  Location: [facility] ENDOSCOPY;  Service: Endoscopy;;   REPLACEMENT TOTAL KNEE  2005   right   RIGHT HEART CATHETERIZATION  04/30/2011   Procedure: RIGHT HEART CATH;  Surgeon: Runell Gess, MD;  Location: [facility] CATH LAB;  Service: Cardiovascular;;   TEE WITHOUT CARDIOVERSION  04/30/2011   Procedure: TRANSESOPHAGEAL ECHOCARDIOGRAM (TEE);  Surgeon: Thurmon Fair, MD;  Location: [facility] ENDOSCOPY;  Service: Cardiovascular;  Laterality: N/A;  3D TEE/ patient will be admitted the day before test , therefo patient will be a inpatient the  day of TEE   TOTAL KNEE ARTHROPLASTY Left 02/15/2012   Procedure: TOTAL KNEE ARTHROPLASTY;  Surgeon: Harvie Junior, MD;  Location: [facility] OR;  Service: Orthopedics;  Laterality: Left;    Past Medical History:  Diagnosis Date   Anemia    low hemoglobin   Arthritis    AV (angiodysplasia malformation of colon)    Benign prostatic hypertrophy    Cancer (HCC)    basal cell cancer   Chronic kidney disease    elevated Cr, BUN, Dr. Caryn Section manages   COPD (chronic obstructive pulmonary disease) (HCC)    Coronary artery disease    GERD (gastroesophageal reflux disease)    Gout    History of kidney stones    Hyperlipidemia    Hypertension    Influenza    February 2019   Mitral regurgitation    Mitral valve prolapse    Pneumonia 1970's   PONV (postoperative nausea and vomiting)    1st knee replacement    Medications:  Scheduled:  famotidine  10 mg Oral QHS   ferrous sulfate  325 mg Oral BID WC   sodium bicarbonate  50 mEq Intravenous Once   sodium zirconium cyclosilicate  10 g Oral TID    (Not in a hospital admission)   ALLERGIES:   Allergies  Allergen Reactions   Sodium Bicarbonate Other (See Comments)    Made the patient feel "unwell, not normal."    FAM HX: Family History  Problem Relation Age of Onset   Hypertension Mother    Osteoporosis Mother    Lung cancer Father    Irregular heart beat Brother    Heart attack Maternal Grandfather    Cancer Paternal Grandfather     Social History:   reports that he quit smoking about 62 years ago. His smoking use included cigarettes. He started smoking about 82 years ago. He quit smokeless tobacco use about 62 years ago. He reports current alcohol use of about 7.0 standard drinks of alcohol per week. He reports that he does not use drugs.  ROS: ROS all systems reviewed and are negative except as per HPI  Blood pressure (!) 123/42, pulse 62, temperature (!) 90 F (32.2 C), temperature source Rectal, resp. rate (!) 22, height 6'  (1.829 m), weight 72.6 kg, SpO2 95%. PHYSICAL EXAM: Physical Exam GEN appears unwell HEENT very dry MM NECK flat neck veins PULM clear CV bradycardic, pads in place ABD soft EXT + burnished skin bilateral LE, no cellulitic appearing lesions NEURO alert, hard of hearing  Results for orders placed or performed during the hospital encounter of 04/18/23 (from the past 48 hours)  CBC     Status: Abnormal   Collection Time: 04/18/23 11:57 AM  Result Value Ref Range   WBC 7.4 4.0 - 10.5 K/uL   RBC 2.53 (  L) 4.22 - 5.81 MIL/uL   Hemoglobin 7.4 (L) 13.0 - 17.0 g/dL   HCT 96.0 (L) 45.4 - 09.8 %   MCV 91.3 80.0 - 100.0 fL   MCH 29.2 26.0 - 34.0 pg   MCHC 32.0 30.0 - 36.0 g/dL   RDW 11.9 (H) 14.7 - 82.9 %   Platelets 129 (L) 150 - 400 K/uL   nRBC 0.7 (H) 0.0 - 0.2 %    Comment: Performed at [facility] Lab, [address] [place], [place] [address]  Basic metabolic panel     Status: Abnormal   Collection Time: 04/18/23 11:57 AM  Result Value Ref Range   Sodium 136 135 - 145 mmol/L   Potassium 6.1 (H) 3.5 - 5.1 mmol/L   Chloride 109 98 - 111 mmol/L   CO2 14 (L) 22 - 32 mmol/L   Glucose, Bld 119 (H) 70 - 99 mg/dL    Comment: Glucose reference range applies only to samples taken after fasting for at least 8 hours.   BUN 102 (H) 8 - 23 mg/dL   Creatinine, Ser 0.86 (H) 0.61 - 1.24 mg/dL   Calcium 8.4 (L) 8.9 - 10.3 mg/dL   GFR, Estimated 10 (L) >60 mL/min    Comment: (NOTE) Calculated using the CKD-EPI Creatinine Equation (2021)    Anion gap 13 5 - 15    Comment: Performed at [facility] Lab, [address] [place], [place] [address]  Troponin I (High Sensitivity)     Status: Abnormal   Collection Time: 04/18/23 11:57 AM  Result Value Ref Range   Troponin I (High Sensitivity) 24 (H) <18 ng/L    Comment: (NOTE) Elevated high sensitivity troponin I (hsTnI) values and significant  changes across serial measurements may suggest ACS but many other  chronic and acute conditions  are known to elevate hsTnI results.  Refer to the "Links" section for chest pain algorithms and additional  guidance. Performed at [facility] Lab, [address] [place], [place] [address]   TSH     Status: Abnormal   Collection Time: 04/18/23 12:48 PM  Result Value Ref Range   TSH 4.891 (H) 0.350 - 4.500 uIU/mL    Comment: Performed by a 3rd Generation assay with a functional sensitivity of <=0.01 uIU/mL. Performed at [facility], [address], [place], [place] [address]   Troponin I (High Sensitivity)     Status: Abnormal   Collection Time: 04/18/23 12:48 PM  Result Value Ref Range   Troponin I (High Sensitivity) 23 (H) <18 ng/L    Comment: (NOTE) Elevated high sensitivity troponin I (hsTnI) values and significant  changes across serial measurements may suggest ACS but many other  chronic and acute conditions are known to elevate hsTnI results.  Refer to the "Links" section for chest pain algorithms and additional  guidance. Performed at [facility] Lab, [address], [place], [place] [address]   T4, free     Status: None   Collection Time: 04/18/23 12:49 PM  Result Value Ref Range   Free T4 0.93 0.61 - 1.12 ng/dL    Comment: (NOTE) Biotin ingestion may interfere with free T4 tests. If the results are inconsistent with the TSH level, previous test results, or the clinical presentation, then consider biotin interference. If needed, order repeat testing after stopping biotin. Performed at [facility], [address] [place], [place] [address]   Hepatic function panel     Status: Abnormal  Collection Time: 04/18/23 12:49 PM  Result Value Ref Range   Total Protein 4.8 (L) 6.5 - 8.1 g/dL   Albumin 2.3 (L) 3.5 - 5.0 g/dL   AST 34 15 - 41 U/L   ALT 18 0 - 44 U/L   Alkaline Phosphatase 62 38 - 126 U/L   Total Bilirubin 0.3 0.0 - 1.2 mg/dL   Bilirubin, Direct <6.0 0.0 - 0.2 mg/dL   Indirect Bilirubin NOT CALCULATED 0.3 - 0.9 mg/dL    Comment:  Performed at [facility] Lab, [address] [place], [place] [address]  Magnesium     Status: None   Collection Time: 04/18/23 12:49 PM  Result Value Ref Range   Magnesium 1.8 1.7 - 2.4 mg/dL    Comment: Performed at [facility] Lab, [address] [place], [place] [address]  Lactic acid, plasma     Status: Abnormal   Collection Time: 04/18/23  1:05 PM  Result Value Ref Range   Lactic Acid, Venous 2.5 (HH) 0.5 - 1.9 mmol/L    Comment: CRITICAL RESULT CALLED TO, READ BACK BY AND VERIFIED WITH KRYSTAL Tollison RN.@1358  ON 4.17.25 BY TCALDWELL MT. Performed at [facility] Lab, [address], [place], [place] [address]   Potassium     Status: Abnormal   Collection Time: 04/18/23  1:14 PM  Result Value Ref Range   Potassium 6.4 (HH) 3.5 - 5.1 mmol/L    Comment: CRITICAL RESULT CALLED TO, READ BACK BY AND VERIFIED WITH  K. Ross RN , @1412  , 04/18/23, Dabdee,T. Performed at [facility] Lab, [address], [place], [place] [address]     ECHOCARDIOGRAM COMPLETE Result Date: 04/18/2023    ECHOCARDIOGRAM REPORT   Patient Name:   RANVEER WAHLSTROM [name] Date of Exam: 04/18/2023 Medical Rec #:  [id number]            Height:       72.0 in Accession #:    [id number]           Weight:       160.1 lb Date of Birth:  1925/01/10             BSA:          1.938 m Patient Age:    89 years             BP:           115/42 mmHg Patient Gender: M                    HR:           33 bpm. Exam Location:  Inpatient Procedure: 2D Echo, Cardiac Doppler and Color Doppler (Both Spectral and Color            Flow Doppler were utilized during procedure). Indications:     Heart block, Complete I44.2  History:         Patient has prior history of Echocardiogram examinations, most                  recent 02/22/2016. CAD, COPD; Risk Factors:Hypertension.  Sonographer:     Darlys Gales Referring Phys:  [id number] TAYLOR A PARCELLS Diagnosing Phys: Arvilla Meres MD IMPRESSIONS  1. Left ventricular ejection fraction, by  estimation, is 60 to 65%. The left ventricle has normal function. The left ventricle has no regional wall motion abnormalities. Left ventricular diastolic parameters are indeterminate.  2. Right ventricular systolic function is  mildly reduced. The right ventricular size is moderately enlarged. There is moderately elevated pulmonary artery systolic pressure. The estimated right ventricular systolic pressure is 50.8 mmHg.  3. Left atrial size was severely dilated.  4. Right atrial size was severely dilated.  5. The mitral valve is degenerative. Moderate mitral valve regurgitation. No evidence of mitral stenosis. There is mild prolapse of posterior leaflet of the mitral valve.  6. Tricuspid valve regurgitation is mild to moderate.  7. The aortic valve is tricuspid. There is mild calcification of the aortic valve. Aortic valve regurgitation is not visualized. Aortic valve sclerosis/calcification is present, without any evidence of aortic stenosis.  8. Pulmonic valve regurgitation is moderate. FINDINGS  Left Ventricle: Left ventricular ejection fraction, by estimation, is 60 to 65%. The left ventricle has normal function. The left ventricle has no regional wall motion abnormalities. The left ventricular internal cavity size was normal in size. There is  no left ventricular hypertrophy. Left ventricular diastolic parameters are indeterminate. Right Ventricle: The right ventricular size is moderately enlarged. No increase in right ventricular wall thickness. Right ventricular systolic function is mildly reduced. There is moderately elevated pulmonary artery systolic pressure. The tricuspid regurgitant velocity is 3.27 m/s, and with an assumed right atrial pressure of 8 mmHg, the estimated right ventricular systolic pressure is 50.8 mmHg. Left Atrium: Left atrial size was severely dilated. Right Atrium: Right atrial size was severely dilated. Pericardium: There is no evidence of pericardial effusion. Mitral Valve: The  mitral valve is degenerative in appearance. There is mild prolapse of posterior leaflet of the mitral valve. Moderate mitral valve regurgitation. No evidence of mitral valve stenosis. Tricuspid Valve: The tricuspid valve is normal in structure. Tricuspid valve regurgitation is mild to moderate. No evidence of tricuspid stenosis. Aortic Valve: The aortic valve is tricuspid. There is mild calcification of the aortic valve. Aortic valve regurgitation is not visualized. Aortic valve sclerosis/calcification is present, without any evidence of aortic stenosis. Aortic valve mean gradient measures 6.0 mmHg. Aortic valve peak gradient measures 12.0 mmHg. Aortic valve area, by VTI measures 2.11 cm. Pulmonic Valve: The pulmonic valve was normal in structure. Pulmonic valve regurgitation is moderate. No evidence of pulmonic stenosis. Aorta: The aortic root is normal in size and structure. IAS/Shunts: No atrial level shunt detected by color flow Doppler.  LEFT VENTRICLE PLAX 2D LVIDd:         5.60 cm   Diastology LVIDs:         3.70 cm   LV e' medial:    10.80 cm/s LV PW:         0.90 cm   LV E/e' medial:  12.0 LV IVS:        0.90 cm   LV e' lateral:   9.79 cm/s LVOT diam:     1.90 cm   LV E/e' lateral: 13.3 LV SV:         85 LV SV Index:   44 LVOT Area:     2.84 cm  RIGHT VENTRICLE RV S prime:     13.80 cm/s TAPSE (M-mode): 2.6 cm LEFT ATRIUM              Index        RIGHT ATRIUM           Index LA Vol (A2C):   132.0 ml 68.11 ml/m  RA Area:     24.50 cm LA Vol (A4C):   100.0 ml 51.60 ml/m  RA Volume:   63.70 ml  32.87 ml/m LA Biplane Vol: 115.0 ml 59.34 ml/m  AORTIC VALVE AV Area (Vmax):    2.11 cm AV Area (Vmean):   1.91 cm AV Area (VTI):     2.11 cm AV Vmax:           173.00 cm/s AV Vmean:          116.000 cm/s AV VTI:            0.401 m AV Peak Grad:      12.0 mmHg AV Mean Grad:      6.0 mmHg LVOT Vmax:         129.00 cm/s LVOT Vmean:        78.100 cm/s LVOT VTI:          0.299 m LVOT/AV VTI ratio: 0.75  AORTA Ao  Root diam: 3.10 cm MITRAL VALVE                TRICUSPID VALVE MV Area (PHT): 3.10 cm     TR Peak grad:   42.8 mmHg MV Decel Time: 245 msec     TR Vmax:        327.00 cm/s MV E velocity: 130.00 cm/s MV A velocity: 60.30 cm/s   SHUNTS MV E/A ratio:  2.16         Systemic VTI:  0.30 m                             Systemic Diam: 1.90 cm Jules Oar MD Electronically signed by Jules Oar MD Signature Date/Time: 04/18/2023/2:33:12 PM    Final (Updated)    DG Chest Portable 1 View Result Date: 04/18/2023 CLINICAL DATA:  Bradycardia, weakness EXAM: PORTABLE CHEST 1 VIEW COMPARISON:  04/04/2023 FINDINGS: Single frontal view of the chest demonstrates stable enlargement of the cardiac silhouette. Stable scarring at the left lung base. No acute airspace disease, effusion, or pneumothorax. No acute bony abnormality. IMPRESSION: 1. Stable chest, no acute process. Electronically Signed   By: Bobbye Burrow M.D.   On: 04/18/2023 14:31    Assessment/Plan  Hyperkalemia: with associated bradycardia/ heart block.  Albuterol neb given after my exit from room- looks like HR up to 50s-60s from 30s-40s.  - aggressive rx with fluid resuscitation- bicarb gtt and NS  - given neb  - insulin/ dextrose  - calcium  - lokelma TID  - serial K checks  - making urine  - discussed with pt and wife- will make every effort to correct this without dialysis  2.  AKI on CKD 3  - poor PO intake in setting of lisinopril, recent cellulitis  - as above in #1  3.  Bradycardia/ heart block  - cardiology following  - holding diltiazem  - treatment of hyperkalemia as above  - TTE  4.  Dispo: to ICU  Leandra Pro 04/18/2023, 4:14 PM

## 2023-04-18 NOTE — Progress Notes (Addendum)
 eLink Physician-Brief Progress Note Patient Name: [name] DOB: 1925/04/01 MRN: [id number]   Date of Service  04/18/2023  HPI/Events of Note  89 yr old male patient with CAD, HTN, dyslipidemia, CKD stage-3b, gout, chronic fe def anemia, moderate PCM who presented to ED with generalized weakness and low oral intake for 2 days.    Potassium is 5.6.  Has been hyperkalemic.  On a bicarb infusion as well.  Concern that the patient cannot swallow the Lokelma .  eICU Interventions  Place NG tube as it seems nutrition has also been a problem.  Use it for meds as needed.   2141 -NG tube confirmed, switch meds to per tube  0331 -anemic and in shock.  Hemoglobin 6.7.  Will order type and screen and 1 unit transfusion.  0405 -K5.7, magnesium  1.6, Lokelma  3 times daily already in place.  Add magnesium  and calcium .  Intervention Category Minor Interventions: Routine modifications to care plan (e.g. PRN medications for pain, fever)  [name] 04/18/2023, 7:46 PM

## 2023-04-18 NOTE — ED Provider Notes (Signed)
 [place] EMERGENCY DEPARTMENT AT [facility] Provider Note   CSN: [id number] Arrival date & time: 04/18/23  1133     History  Chief Complaint  Patient presents with   Bradycardia    [name] is a 89 y.o. male.  Is a 89 year old male presenting emergency department for generalized weakness over the past week.  Recent admission for cellulitis and currently on antibiotics.  Decreased p.o. intake over the past week.  Denies chest pain, shortness of breath, no lightheadedness, near syncope.        Home Medications Prior to Admission medications   Medication Sig Start Date End Date Taking? Authorizing Provider  acetaminophen (TYLENOL) 325 MG tablet Take 650 mg by mouth every 6 (six) hours as needed for mild pain or headache.    [provider]  allopurinol (ZYLOPRIM) 300 MG tablet Take 150 mg by mouth daily.     [provider]  aspirin EC 81 MG tablet Take 81 mg by mouth See admin instructions. Take 81 mg by mouth on Mondays and Wednesdays- swallow whole    [provider]  atorvastatin (LIPITOR) 10 MG tablet TAKE 1 TABLET (10 MG TOTAL) BY MOUTH DAILY AT 6 PM. 05/17/22   Runell Gess, MD  diltiazem (CARDIZEM CD) 120 MG 24 hr capsule Take 1 capsule (120 mg total) by mouth daily. Please call [phone] to schedule a  July appointment for future refills. Thank you. Patient taking differently: Take 120 mg by mouth in the morning. 02/12/23 05/13/23  Runell Gess, MD  ketoconazole (NIZORAL) 2 % cream Apply 1 Application topically See admin instructions. Apply to affected area of the buttocks region 2 times a day as directed 04/02/23   [provider]  leptospermum manuka honey (MEDIHONEY) PSTE paste Apply 1 Application topically daily. 04/09/23   [name], MD  linezolid (ZYVOX) 600 MG tablet Take 1 tablet (600 mg total) by mouth every 12 (twelve) hours for 10 days. 04/08/23 04/18/23  Odette Fraction, MD  liver oil-zinc  oxide (DESITIN) 40 % ointment Apply topically 3 (three) times daily. 04/08/23   [name], MD  omeprazole (PRILOSEC OTC) 20 MG tablet Take 20 mg by mouth every other day.    [provider]  OVER THE COUNTER MEDICATION Place 1 drop into both eyes See admin instructions. Thera Tears- Instill 1 drop into both eyes three times a day as needed for dryness    [provider]  polyethylene glycol (MIRALAX / GLYCOLAX) packet Take 17 g by mouth daily as needed for mild constipation or moderate constipation.    [provider]  UNKNOWN TO PATIENT Place 1 drop into both eyes See admin instructions. Unnamed allergy relief eye drops- Instill 1 drop into both eyes three times a day as needed for itching and/or allergies    [provider]      Allergies    Sodium bicarbonate    Review of Systems   Review of Systems  Physical Exam Updated Vital Signs BP (!) 118/45   Pulse (!) 55   Temp (!) 94.9 F (34.9 C) (Rectal)   Resp (!) 27   Ht 6' (1.829 m)   Wt 72.6 kg   SpO2 98%   BMI 21.71 kg/m  Physical Exam Vitals and nursing note reviewed.  Constitutional:      General: He is not in acute distress.    Appearance: He is not toxic-appearing.  HENT:     Head: Normocephalic.  Nose: Nose normal.     Mouth/Throat:     Mouth: Mucous membranes are dry.  Eyes:     Conjunctiva/sclera: Conjunctivae normal.  Cardiovascular:     Rate and Rhythm: Bradycardia present.     Pulses: Normal pulses.  Pulmonary:     Effort: Pulmonary effort is normal.     Breath sounds: Normal breath sounds.  Abdominal:     General: Abdomen is flat. There is no distension.     Tenderness: There is no abdominal tenderness. There is no guarding or rebound.  Skin:    General: Skin is warm and dry.     Capillary Refill: Capillary refill takes less than 2 seconds.  Neurological:     Mental Status: He is alert and oriented to person, place, and time.  Psychiatric:        Mood and  Affect: Mood normal.        Behavior: Behavior normal.     ED Results / Procedures / Treatments   Labs (all labs ordered are listed, but only abnormal results are displayed) Labs Reviewed  CBC - Abnormal; Notable for the following components:      Result Value   RBC 2.53 (*)    Hemoglobin 7.4 (*)    HCT 23.1 (*)    RDW 21.1 (*)    Platelets 129 (*)    nRBC 0.7 (*)    All other components within normal limits  BASIC METABOLIC PANEL WITH GFR - Abnormal; Notable for the following components:   Potassium 6.1 (*)    CO2 14 (*)    Glucose, Bld 119 (*)    BUN 102 (*)    Creatinine, Ser 5.17 (*)    Calcium 8.4 (*)    GFR, Estimated 10 (*)    All other components within normal limits  LACTIC ACID, PLASMA - Abnormal; Notable for the following components:   Lactic Acid, Venous 2.5 (*)    All other components within normal limits  TSH - Abnormal; Notable for the following components:   TSH 4.891 (*)    All other components within normal limits  HEPATIC FUNCTION PANEL - Abnormal; Notable for the following components:   Total Protein 4.8 (*)    Albumin 2.3 (*)    All other components within normal limits  POTASSIUM - Abnormal; Notable for the following components:   Potassium 6.4 (*)    All other components within normal limits  TROPONIN I (HIGH SENSITIVITY) - Abnormal; Notable for the following components:   Troponin I (High Sensitivity) 24 (*)    All other components within normal limits  TROPONIN I (HIGH SENSITIVITY) - Abnormal; Notable for the following components:   Troponin I (High Sensitivity) 23 (*)    All other components within normal limits  CULTURE, BLOOD (ROUTINE X 2)  CULTURE, BLOOD (ROUTINE X 2)  MRSA NEXT GEN BY PCR, NASAL  T4, FREE  MAGNESIUM  POTASSIUM  POTASSIUM  POTASSIUM  URINALYSIS, ROUTINE W REFLEX MICROSCOPIC  LACTIC ACID, PLASMA  LACTIC ACID, PLASMA  OCCULT BLOOD X 1 CARD TO LAB, STOOL  CBG MONITORING, ED  I-STAT CG4 LACTIC ACID, ED    EKG EKG  Interpretation Date/Time:  Thursday April 18 2023 11:53:06 EDT Ventricular Rate:  31 PR Interval:    QRS Duration:  123 QT Interval:  614 QTC Calculation: 441 R Axis:   -43  Text Interpretation: Complete (3-degree) AV block Nonspecific IVCD with LAD Inferior infarct, old Abnormal lateral Q waves Anterior infarct, old  Confirmed by Estanislado Pandy [phone]) on 04/18/2023 1:45:23 PM  Radiology US RENAL Result Date: 04/18/2023 CLINICAL DATA:  Acute kidney injury EXAM: RENAL / URINARY TRACT ULTRASOUND COMPLETE COMPARISON:  09/19/2004 FINDINGS: Right Kidney: Renal measurements: 10.1 x 5.1 x 6.3 cm = volume: 169 mL. Mild increased echogenicity the renal cortex without significant thinning. No mass or hydronephrosis visualized. Left Kidney: Renal measurements: 9.0 x 5.2 x 4.3 cm = volume: 106 mL. Mild increased echogenicity the renal cortex without significant thinning. No mass or hydronephrosis visualized. Bladder: Appears normal for degree of bladder distention. Other: Prostate is mildly enlarged. IMPRESSION: Increased echogenicity of the renal cortices suggestive of chronic medical renal disease. Electronically Signed   By: Acquanetta Belling M.D.   On: 04/18/2023 16:38   ECHOCARDIOGRAM COMPLETE Result Date: 04/18/2023    ECHOCARDIOGRAM REPORT   Patient Name:   [name] Date of Exam: 04/18/2023 Medical Rec #:  [id number]            Height:       72.0 in Accession #:    [id number]           Weight:       160.1 lb Date of Birth:  Oct 26, 1925             BSA:          1.938 m Patient Age:    89 years             BP:           115/42 mmHg Patient Gender: M                    HR:           33 bpm. Exam Location:  Inpatient Procedure: 2D Echo, Cardiac Doppler and Color Doppler (Both Spectral and Color            Flow Doppler were utilized during procedure). Indications:     Heart block, Complete I44.2  History:         Patient has prior history of Echocardiogram examinations, most                  recent  02/22/2016. CAD, COPD; Risk Factors:Hypertension.  Sonographer:     Darlys Gales Referring Phys:  [id number] TAYLOR A PARCELLS Diagnosing Phys: Arvilla Meres MD IMPRESSIONS  1. Left ventricular ejection fraction, by estimation, is 60 to 65%. The left ventricle has normal function. The left ventricle has no regional wall motion abnormalities. Left ventricular diastolic parameters are indeterminate.  2. Right ventricular systolic function is mildly reduced. The right ventricular size is moderately enlarged. There is moderately elevated pulmonary artery systolic pressure. The estimated right ventricular systolic pressure is 50.8 mmHg.  3. Left atrial size was severely dilated.  4. Right atrial size was severely dilated.  5. The mitral valve is degenerative. Moderate mitral valve regurgitation. No evidence of mitral stenosis. There is mild prolapse of posterior leaflet of the mitral valve.  6. Tricuspid valve regurgitation is mild to moderate.  7. The aortic valve is tricuspid. There is mild calcification of the aortic valve. Aortic valve regurgitation is not visualized. Aortic valve sclerosis/calcification is present, without any evidence of aortic stenosis.  8. Pulmonic valve regurgitation is moderate. FINDINGS  Left Ventricle: Left ventricular ejection fraction, by estimation, is 60 to 65%. The left ventricle has normal function. The left ventricle has no regional wall motion abnormalities. The left ventricular internal cavity size was normal  in size. There is  no left ventricular hypertrophy. Left ventricular diastolic parameters are indeterminate. Right Ventricle: The right ventricular size is moderately enlarged. No increase in right ventricular wall thickness. Right ventricular systolic function is mildly reduced. There is moderately elevated pulmonary artery systolic pressure. The tricuspid regurgitant velocity is 3.27 m/s, and with an assumed right atrial pressure of 8 mmHg, the estimated right ventricular  systolic pressure is 50.8 mmHg. Left Atrium: Left atrial size was severely dilated. Right Atrium: Right atrial size was severely dilated. Pericardium: There is no evidence of pericardial effusion. Mitral Valve: The mitral valve is degenerative in appearance. There is mild prolapse of posterior leaflet of the mitral valve. Moderate mitral valve regurgitation. No evidence of mitral valve stenosis. Tricuspid Valve: The tricuspid valve is normal in structure. Tricuspid valve regurgitation is mild to moderate. No evidence of tricuspid stenosis. Aortic Valve: The aortic valve is tricuspid. There is mild calcification of the aortic valve. Aortic valve regurgitation is not visualized. Aortic valve sclerosis/calcification is present, without any evidence of aortic stenosis. Aortic valve mean gradient measures 6.0 mmHg. Aortic valve peak gradient measures 12.0 mmHg. Aortic valve area, by VTI measures 2.11 cm. Pulmonic Valve: The pulmonic valve was normal in structure. Pulmonic valve regurgitation is moderate. No evidence of pulmonic stenosis. Aorta: The aortic root is normal in size and structure. IAS/Shunts: No atrial level shunt detected by color flow Doppler.  LEFT VENTRICLE PLAX 2D LVIDd:         5.60 cm   Diastology LVIDs:         3.70 cm   LV e' medial:    10.80 cm/s LV PW:         0.90 cm   LV E/e' medial:  12.0 LV IVS:        0.90 cm   LV e' lateral:   9.79 cm/s LVOT diam:     1.90 cm   LV E/e' lateral: 13.3 LV SV:         85 LV SV Index:   44 LVOT Area:     2.84 cm  RIGHT VENTRICLE RV S prime:     13.80 cm/s TAPSE (M-mode): 2.6 cm LEFT ATRIUM              Index        RIGHT ATRIUM           Index LA Vol (A2C):   132.0 ml 68.11 ml/m  RA Area:     24.50 cm LA Vol (A4C):   100.0 ml 51.60 ml/m  RA Volume:   63.70 ml  32.87 ml/m LA Biplane Vol: 115.0 ml 59.34 ml/m  AORTIC VALVE AV Area (Vmax):    2.11 cm AV Area (Vmean):   1.91 cm AV Area (VTI):     2.11 cm AV Vmax:           173.00 cm/s AV Vmean:           116.000 cm/s AV VTI:            0.401 m AV Peak Grad:      12.0 mmHg AV Mean Grad:      6.0 mmHg LVOT Vmax:         129.00 cm/s LVOT Vmean:        78.100 cm/s LVOT VTI:          0.299 m LVOT/AV VTI ratio: 0.75  AORTA Ao Root diam: 3.10 cm MITRAL VALVE  TRICUSPID VALVE MV Area (PHT): 3.10 cm     TR Peak grad:   42.8 mmHg MV Decel Time: 245 msec     TR Vmax:        327.00 cm/s MV E velocity: 130.00 cm/s MV A velocity: 60.30 cm/s   SHUNTS MV E/A ratio:  2.16         Systemic VTI:  0.30 m                             Systemic Diam: 1.90 cm Arvilla Meres MD Electronically signed by Arvilla Meres MD Signature Date/Time: 04/18/2023/2:33:12 PM    Final (Updated)    DG Chest Portable 1 View Result Date: 04/18/2023 CLINICAL DATA:  Bradycardia, weakness EXAM: PORTABLE CHEST 1 VIEW COMPARISON:  04/04/2023 FINDINGS: Single frontal view of the chest demonstrates stable enlargement of the cardiac silhouette. Stable scarring at the left lung base. No acute airspace disease, effusion, or pneumothorax. No acute bony abnormality. IMPRESSION: 1. Stable chest, no acute process. Electronically Signed   By: Sharlet Salina M.D.   On: 04/18/2023 14:31    Procedures .Critical Care  Performed by: Coral Spikes, DO Authorized by: Coral Spikes, DO   Critical care provider statement:    Critical care time (minutes):  30   Critical care was necessary to treat or prevent imminent or life-threatening deterioration of the following conditions:  Cardiac failure, metabolic crisis and renal failure   Critical care was time spent personally by me on the following activities:  Development of treatment plan with patient or surrogate, discussions with consultants, evaluation of patient's response to treatment, examination of patient, ordering and review of laboratory studies, ordering and review of radiographic studies, ordering and performing treatments and interventions, pulse oximetry, re-evaluation of patient's  condition and review of old charts     Medications Ordered in ED Medications  sodium zirconium cyclosilicate (LOKELMA) packet 10 g (10 g Oral Given 04/18/23 1553)  sodium bicarbonate 150 mEq in dextrose 5 % 1,150 mL infusion ( Intravenous New Bag/Given 04/18/23 1552)  sodium chloride 0.9 % bolus 1,000 mL (has no administration in time range)  docusate sodium (COLACE) capsule 100 mg (has no administration in time range)  polyethylene glycol (MIRALAX / GLYCOLAX) packet 17 g (has no administration in time range)  famotidine (PEPCID) tablet 10 mg (has no administration in time range)  ferrous sulfate tablet 325 mg (has no administration in time range)  calcium gluconate 1 g/ 50 mL sodium chloride IVPB (0 mg Intravenous Stopped 04/18/23 1313)  albuterol (PROVENTIL) (2.5 MG/3ML) 0.083% nebulizer solution 10 mg (10 mg Nebulization Given 04/18/23 1525)  insulin aspart (novoLOG) injection 5 Units (5 Units Intravenous Given 04/18/23 1545)    And  dextrose 50 % solution 50 mL (50 mLs Intravenous Given 04/18/23 1545)  sodium chloride 0.9 % bolus 500 mL (500 mLs Intravenous New Bag/Given 04/18/23 1544)  sodium bicarbonate injection 50 mEq (50 mEq Intravenous Given 04/18/23 1628)    ED Course/ Medical Decision Making/ A&P Clinical Course as of 04/18/23 1648  Thu Apr 18, 2023  1218 Spoke with cardiology.  They will come assess patient for possible pacemaker placement for what appears to be complete heart block on monitor. [TY]  1258 Informed the patient is hypothermic.  Added on sepsis labs.  Added on thyroid studies as well.  Patient is mentating well and only complains of generalized weakness. [TY]  1259 Echo in 2018: "Study  Conclusions   - Left ventricle: The cavity size was moderately dilated. There was    mild concentric hypertrophy. Systolic function was normal. The    estimated ejection fraction was in the range of 60% to 65%. Wall    motion was normal; there were no regional wall motion     abnormalities. Left ventricular diastolic function parameters    were normal.  - Aortic valve: Transvalvular velocity was within the normal range.    There was no stenosis. There was trivial regurgitation.  - Mitral valve: Calcified annulus. Thickening. Mild prolapse,    involving the posterior leaflet. There was moderate regurgitation    directed anteriorly.  - Left atrium: The atrium was severely dilated.  - Right ventricle: The cavity size was normal. Wall thickness was    normal. Systolic function was normal.  - Tricuspid valve: There was mild-moderate regurgitation.  - Pulmonic valve: There was moderate regurgitation.  - Pulmonary arteries: Systolic pressure was mildly increased. PA    peak pressure: 41 mm Hg (S).  " [TY]  1314 Potassium(!): 6.1 Medical therapy.  Will consult nephro given concurrent rise in BUN/creatinine as well as heart block on EKG [TY]    Clinical Course User Index [TY] Rolinda Climes, DO                                 Medical Decision Making This is a 89 year old male DNR on diltiazem with recent admission per chart review for cellulitis possible necrotizing fasciitis on linezolid presented to the emergency department for generalized weakness.  Found to be profoundly bradycardic with a heart rate in the 30s with complete heart block.  Hemodynamically stable however and largely asymptomatic.  Cardiology consulted will admit patient.  Started pacing here in the emergency department.  Does have an elevated potassium; albuterol, insulin ordered, appears to have new AKI.  Small fluid bolus.  Plan to admit to cardiology/CCM  Amount and/or Complexity of Data Reviewed Independent Historian:     Details: Family member at bedside notes decreased p.o. intake External Data Reviewed:     Details: See ED course Labs: ordered. Decision-making details documented in ED Course.    Details: See above Radiology: ordered.    Details: Chest x-ray without pneumonia  pneumothorax ECG/medicine tests:     Details: ECG appears to be complete heart block. No ST segment changes to indicate ischemia. Discussion of management or test interpretation with external provider(s): Cardiology.  Risk OTC drugs. Prescription drug management. Decision regarding hospitalization. Diagnosis or treatment significantly limited by social determinants of health. Risk Details: Advanced age.  Also discussed goals of care and confirmed patient's DNR status.          Final Clinical Impression(s) / ED Diagnoses Final diagnoses:  Bradycardia    Rx / DC Orders ED Discharge Orders     None         Rolinda Climes, DO 04/18/23 1648

## 2023-04-18 NOTE — Plan of Care (Signed)
 Returned to evaluate patient due to concern he needed inotropic support  Key event: Called for bradycardia.  Asked to consider starting inotropes.  Patient notes nothing.  Verbally responds.  To stimuli.  Unchanged from baseline in ICU.  Key diagnostics: bradycardia on telemetry is unchanged.  AP:  - if hypotension arises from bradycardia start dopamine 2.5.  No changes at this time  Coordination plan: Nursing aware, reached out to [facility] as FYI.  Time spent  < 15 minutes (No charge)   Gloriann Larger, MD FASE [facility] Cardiologist [facility]  [address] [place], [address] [place], [place] [address] [phone]  7:38 PM

## 2023-04-18 NOTE — Progress Notes (Signed)
 Pharmacy Antibiotic Note  [name] is a 89 y.o. male admitted on 04/18/2023 with sepsis.  Pharmacy has been consulted for vancomycin and cefepime dosing.Noted patient's serum creatinine is elevated from baseline, will dose vancomycin based on levels.   4/17: WBC 7.4, Scr 5.17, Lactate 2.5, Temperature 32.2C, HR 50s  Plan: Vancomycin 1500 mg IV x1 Check vancomycin level tomorrow to determine future dosing Zosyn 2.25 gm IV every 8 hours Monitor renal function daily F/u cultures and clinical progression  Height: 6' (182.9 cm) Weight: 72.6 kg (160 lb 0.9 oz) IBW/kg (Calculated) : 77.6  Temp (24hrs), Avg:90 F (32.2 C), Min:90 F (32.2 C), Max:90 F (32.2 C)  Recent Labs  Lab 04/16/23 1352 04/18/23 1157 04/18/23 1305  WBC 11.5* 7.4  --   CREATININE  --  5.17*  --   LATICACIDVEN  --   --  2.5*    Estimated Creatinine Clearance: 8.8 mL/min (A) (by C-G formula based on SCr of 5.17 mg/dL (H)).    Allergies  Allergen Reactions   Sodium Bicarbonate Other (See Comments)    Made the patient feel "unwell, not normal."    Antimicrobials this admission: Vancomycin 4/17 >> Zosyn 4/17 >>  Microbiology results: 4/17 Bcx:   Thank you for allowing pharmacy to be a part of this patient's care.  Volney Grumbles, PharmD PGY-1 Acute Care Pharmacy Resident 04/18/2023 2:59 PM

## 2023-04-18 NOTE — Consult Note (Signed)
 NAME:  [name], MRN:  [id number], DOB:  1925-12-24, LOS: 0 ADMISSION DATE:  04/18/2023, CONSULTATION DATE:  04/18/2023  REFERRING MD: Jiles Mote, PA-C, CHIEF COMPLAINT:  Hyperkalemia    History of Present Illness:  A 89 yr old male patient with CAD, HTN, dyslipidemia, CKD stage-3b, gout, chronic fe def anemia, moderate PCM who presented to ED with generalized weakness and low oral intake for 2 days. He was discharged from [facility] 04/08/2023 after 4 days admission for right lower limb necrotizing fasciitis, s/p 2 week course of Linezolid. For 2 days, he has low oral intake, fatigue, and 2 loose BM without melena or blood per rectum. He has no f/c/r, N/V, abd pain, CP, SOB, fall, syncope, dizziness, wheezing, cough, or dysuria. Continues to take home meds. ACEI and Bactrim were d/c when he was admitted to [facility] due to AKI. Baseline Cr is 1.7 and had Cr 2 in [facility]. He was found in slow Afib (bradycardia 30s), AKI, Bicarb 14, and K 6.4. Given 500 cc NS 0.9%, Albuterol neb, D50 ampoule, insulin 5 units IV, and Ca gluconate 1 gm. Lokelma is ordered.  Seen by cardiology and placed in SQ pacing PRN.  No smoking or illicit drug use, and drinks one alcoholic drink daily.    Pertinent  Medical History  CAD, HTN, dyslipidemia, CKD stage-3b, gout, chronic fe def anemia, moderate PCM, necrotizing fasciitis (4/4-07/2023)   Significant Hospital Events: Including procedures, antibiotic start and stop dates in addition to other pertinent events   4/17:  NS 0.9% 500 cc bolus, Albuterol neb, D50 ampoule, insulin 5 units IV, and Ca gluconate 1 gm. Lokelma is ordered. Seen by cardiology and placed in SQ pacing PRN.  Interim History / Subjective:    Objective   Blood pressure (!) 123/42, pulse 62, temperature (!) 90 F (32.2 C), temperature source Rectal, resp. rate (!) 22, height 6' (1.829 m), weight 72.6 kg, SpO2 95%.        Intake/Output Summary (Last 24 hours) at 04/18/2023 1533 Last data filed at  04/18/2023 1153 Gross per 24 hour  Intake 500 ml  Output --  Net 500 ml   Filed Weights   04/18/23 1158  Weight: 72.6 kg    Examination: General: alert, oriented x4, and comfortable. On RA. SpO2 98%. Afib 40-50 HENT: PERL, normal pharynx and oral mucosa. No LNE or thyromegaly. No JVD Lungs: symmetrical air entry bilaterally. No crackles or wheezing Cardiovascular: NL S1/S2. No m/g/r Abdomen: no distension or tenderness Extremities: trace edema. Symmetrical. Chronic venous insufficiency and scales.   Neuro: nonfocal   Sacral bedsore  Resolved Hospital Problem list     Assessment & Plan:  AKI/CKD-3b Hyperkalemia -ICU admission -IVF: NS 0.9% and Bicarb drip -Serial labs -[facility]  -Nephrology consult -Renal U/S -I/O chart  Lactic acidosis due to bradycardia -IVF -Serial LA   Thrombocytopenia, chronic  -Monitor   Fe def anemia, chronic -Fe PO -Stool H/O  CAD, HTN, dyslipidemia, slow Afib, placed in SQ pacing PRN Echo: EF 60%, RVSP 50.8 mmHg -Cardiology consult -Monitor for need for venous pacing  Gout -Hold home meds for now  Moderate PCM  -Dietitian consult  Sacral bedsore -Wound care   Best Practice (right click and "Reselect all SmartList Selections" daily)   Diet/type: clear liquids DVT prophylaxis SCD Pressure ulcer(s): present on admission  GI prophylaxis: H2B Lines: N/A Foley:  N/A Code Status:  DNR Last date of multidisciplinary goals of care discussion []   Labs   CBC:  Recent Labs  Lab 04/16/23 1352 04/18/23 1157  WBC 11.5* 7.4  NEUTROABS 10.8*  --   HGB 8.9* 7.4*  HCT 26.7* 23.1*  MCV 89.3 91.3  PLT 177 129*    Basic Metabolic Panel: Recent Labs  Lab 04/18/23 1157 04/18/23 1249 04/18/23 1314  NA 136  --   --   K 6.1*  --  6.4*  CL 109  --   --   CO2 14*  --   --   GLUCOSE 119*  --   --   BUN 102*  --   --   CREATININE 5.17*  --   --   CALCIUM 8.4*  --   --   MG  --  1.8  --    GFR: Estimated Creatinine Clearance:  8.8 mL/min (A) (by C-G formula based on SCr of 5.17 mg/dL (H)). Recent Labs  Lab 04/16/23 1352 04/18/23 1157 04/18/23 1305  WBC 11.5* 7.4  --   LATICACIDVEN  --   --  2.5*    Liver Function Tests: Recent Labs  Lab 04/18/23 1249  AST 34  ALT 18  ALKPHOS 62  BILITOT 0.3  PROT 4.8*  ALBUMIN 2.3*   No results for input(s): "LIPASE", "AMYLASE" in the last 168 hours. No results for input(s): "AMMONIA" in the last 168 hours.  ABG    Component Value Date/Time   PHART 7.391 04/30/2011 1058   PCO2ART 31.3 (L) 04/30/2011 1058   PO2ART 96.0 04/30/2011 1058   HCO3 19.2 (L) 04/30/2011 1100   TCO2 20 04/30/2011 1100   ACIDBASEDEF 6.0 (H) 04/30/2011 1100   O2SAT 66.0 04/30/2011 1100     Coagulation Profile: No results for input(s): "INR", "PROTIME" in the last 168 hours.  Cardiac Enzymes: No results for input(s): "CKTOTAL", "CKMB", "CKMBINDEX", "TROPONINI" in the last 168 hours.  HbA1C: No results found for: "HGBA1C"  CBG: No results for input(s): "GLUCAP" in the last 168 hours.  Review of Systems:   Review of Systems  Constitutional:  Positive for malaise/fatigue. Negative for chills, diaphoresis and fever.  HENT:  Negative for congestion, sinus pain and sore throat.   Respiratory:  Positive for cough, hemoptysis and sputum production. Negative for shortness of breath, wheezing and stridor.   Cardiovascular:  Negative for chest pain, palpitations, orthopnea, leg swelling and PND.  Gastrointestinal:  Positive for diarrhea. Negative for abdominal pain, blood in stool, constipation, heartburn, melena, nausea and vomiting.  Genitourinary:  Negative for dysuria and urgency.  Skin:  Negative for itching.     Past Medical History:  He,  has a past medical history of Anemia, Arthritis, AV (angiodysplasia malformation of colon), Benign prostatic hypertrophy, Cancer (HCC), Chronic kidney disease, COPD (chronic obstructive pulmonary disease) (HCC), Coronary artery disease, GERD  (gastroesophageal reflux disease), Gout, History of kidney stones, Hyperlipidemia, Hypertension, Influenza, Mitral regurgitation, Mitral valve prolapse, Pneumonia (1970's), and PONV (postoperative nausea and vomiting).   Surgical History:   Past Surgical History:  Procedure Laterality Date   ABDOMINAL ANGIOGRAM  04/30/2011   Procedure: ABDOMINAL ANGIOGRAM;  Surgeon: Runell Gess, MD;  Location: [facility] CATH LAB;  Service: Cardiovascular;;   CARDIAC CATHETERIZATION  04/2011   x2   CARDIAC CATHETERIZATION  08/2009   CATARACT EXTRACTION  2011   bilateral   COLONOSCOPY  05/04/2011   Procedure: COLONOSCOPY;  Surgeon: Theda Belfast, MD;  Location: [facility] ENDOSCOPY;  Service: Endoscopy;  Laterality: N/A;   COLONOSCOPY N/A 07/16/2013   Procedure: COLONOSCOPY;  Surgeon: Theda Belfast, MD;  Location: [facility] ENDOSCOPY;  Service: Endoscopy;  Laterality: N/A;   COLONOSCOPY WITH PROPOFOL N/A 06/20/2017   Procedure: COLONOSCOPY WITH PROPOFOL;  Surgeon: Jeani Hawking, MD;  Location: [facility] ENDOSCOPY;  Service: Endoscopy;  Laterality: N/A;   CORONARY ANGIOGRAM  04/30/2011   Procedure: CORONARY ANGIOGRAM;  Surgeon: Runell Gess, MD;  Location: [facility] CATH LAB;  Service: Cardiovascular;;   ESOPHAGOGASTRODUODENOSCOPY  05/04/2011   Procedure: ESOPHAGOGASTRODUODENOSCOPY (EGD);  Surgeon: Theda Belfast, MD;  Location: [facility] ENDOSCOPY;  Service: Endoscopy;  Laterality: N/A;   EYE SURGERY     bilateral cataracts removed   HERNIA REPAIR  1999&2000   bilateral   LOBECTOMY  1980   left lower lobectomy for benign tumor   OPEN REDUCTION INTERNAL FIXATION (ORIF) DISTAL RADIAL FRACTURE Left 03/19/2017   Procedure: OPEN REDUCTION INTERNAL FIXATION (ORIF) DISTAL RADIAL FRACTURE;  Surgeon: Jodi Geralds, MD;  Location: [facility] OR;  Service: Orthopedics;  Laterality: Left;  AXILLARY BLOCK   POLYPECTOMY  06/20/2017   Procedure: POLYPECTOMY;  Surgeon: Jeani Hawking, MD;  Location: [facility] ENDOSCOPY;  Service: Endoscopy;;   REPLACEMENT TOTAL KNEE  2005    right   RIGHT HEART CATHETERIZATION  04/30/2011   Procedure: RIGHT HEART CATH;  Surgeon: Runell Gess, MD;  Location[facility] CATH LAB;  Service: Cardiovascular;;   TEE WITHOUT CARDIOVERSION  04/30/2011   Procedure: TRANSESOPHAGEAL ECHOCARDIOGRAM (TEE);  Surgeon: Thurmon Fair, MD;  Location: [facility] ENDOSCOPY;  Service: Cardiovascular;  Laterality: N/A;  3D TEE/ patient will be admitted the day before test , therefo patient will be a inpatient the day of TEE   TOTAL KNEE ARTHROPLASTY Left 02/15/2012   Procedure: TOTAL KNEE ARTHROPLASTY;  Surgeon: Harvie Junior, MD;  Location: [facility] OR;  Service: Orthopedics;  Laterality: Left;     Social History:   reports that he quit smoking about 62 years ago. His smoking use included cigarettes. He started smoking about 82 years ago. He quit smokeless tobacco use about 62 years ago. He reports current alcohol use of about 7.0 standard drinks of alcohol per week. He reports that he does not use drugs.   Family History:  His family history includes Cancer in his paternal grandfather; Heart attack in his maternal grandfather; Hypertension in his mother; Irregular heart beat in his brother; Lung cancer in his father; Osteoporosis in his mother.   Allergies Allergies  Allergen Reactions   Sodium Bicarbonate Other (See Comments)    Made the patient feel "unwell, not normal."     Home Medications  Prior to Admission medications   Medication Sig Start Date End Date Taking? Authorizing Provider  acetaminophen (TYLENOL) 325 MG tablet Take 650 mg by mouth every 6 (six) hours as needed for mild pain or headache.    [provider]  allopurinol (ZYLOPRIM) 300 MG tablet Take 150 mg by mouth daily.     [provider]  aspirin EC 81 MG tablet Take 81 mg by mouth See admin instructions. Take 81 mg by mouth on Mondays and Wednesdays- swallow whole    [provider]  atorvastatin (LIPITOR) 10 MG tablet TAKE 1 TABLET (10 MG TOTAL) BY MOUTH DAILY AT 6  PM. 05/17/22   Runell Gess, MD  diltiazem (CARDIZEM CD) 120 MG 24 hr capsule Take 1 capsule (120 mg total) by mouth daily. Please call [phone] to schedule a  July appointment for future refills. Thank you. Patient taking differently: Take 120 mg by mouth in the morning. 02/12/23 05/13/23  Runell Gess, MD  ketoconazole (NIZORAL) 2 % cream Apply 1 Application topically See admin instructions. Apply to affected area of the buttocks region 2 times a day as directed 04/02/23   [provider]  leptospermum manuka honey (MEDIHONEY) PSTE paste Apply 1 Application topically daily. 04/09/23   Adhikari, Amrit, MD  linezolid (ZYVOX) 600 MG tablet Take 1 tablet (600 mg total) by mouth every 12 (twelve) hours for 10 days. 04/08/23 04/18/23  Manandhar, Sabina, MD  liver oil-zinc oxide (DESITIN) 40 % ointment Apply topically 3 (three) times daily. 04/08/23   Adhikari, Amrit, MD  omeprazole (PRILOSEC OTC) 20 MG tablet Take 20 mg by mouth every other day.    [provider]  OVER THE COUNTER MEDICATION Place 1 drop into both eyes See admin instructions. Thera Tears- Instill 1 drop into both eyes three times a day as needed for dryness    [provider]  polyethylene glycol (MIRALAX / GLYCOLAX) packet Take 17 g by mouth daily as needed for mild constipation or moderate constipation.    [provider]  UNKNOWN TO PATIENT Place 1 drop into both eyes See admin instructions. Unnamed allergy relief eye drops- Instill 1 drop into both eyes three times a day as needed for itching and/or allergies    [provider]     Critical care time: 55 min    Arlyne Bering, MD [facility]

## 2023-04-18 NOTE — Progress Notes (Signed)
   HF/Shock Team Note  Asked to see patient by Dr. Lavonne Prairie for complete HB with symptomatic bradycardia and shock  89 y/o male CAD, mitral regurgitation, HTN, CKD3b. Recently admitted for cellulitis/necrotizing fascitis of RLE. Discharged on 04/08/23.  Brought to ED today for several days of weakness, fatigue and decreased po intake.   On arrival patient with CHB with junctional escape high 20s-lows. He was also hypothermic with temp ~90 F  BP stable ~110. Was mentating ok. He had been on po cardizem.   Plan was for watchful waiting in ICU/ He is DNR/DNI   However labs back with Scr 1.9 -> 5.2 K 6.1 lactate 2.5   Given IV calcium and lokelma  Patient and wife unable to decide if they would want CRR if needed.   On exam.  Elderly frail answers basic questions and follows commands JVP flat Cor reg brady 2/6 MR Lungs CTA  Ab soft NT Ext chronic venous stasis changes with healing small wound. Small sacral decub  Echo EF 60-65% mod MR RV mildly reduced Personally reviewed  Assessment:  1. CHB with symptomatic bradycardia 2. AKI on CKD 3b 3. Cardiogenic shock with lactic acidosis 4. Hyperkalemia  Plan/Discussion:  Suspect he had a period of hypotension prior to arrival leading to ATN/shock with hyperkalemia and CHB. Now BP improved but renal failure is significant He is DNR/DNI.   Given recent infection he is likely not candidate for PPM. Could consider TVP while he stabilizes (if needed) but currently stable with his escape rhythm and tolerates transcutaneous pacing as needed. I think renal failure is main issue now. He is not candidate for HD. Would hydrate and support with inotropes as needed. Would have low threshold to switch to comfort care.   Will bring to ICU. Will involve CCM for assistance.  CRITICAL CARE Performed by: Jules Oar  Total critical care time: 55 minutes  Critical care time was exclusive of separately billable procedures and treating other  patients.  Critical care was necessary to treat or prevent imminent or life-threatening deterioration.  Critical care was time spent personally by me (independent of midlevel providers or residents) on the following activities: development of treatment plan with patient and/or surrogate as well as nursing, discussions with consultants, evaluation of patient's response to treatment, examination of patient, obtaining history from patient or surrogate, ordering and performing treatments and interventions, ordering and review of laboratory studies, ordering and review of radiographic studies, pulse oximetry and re-evaluation of patient's condition.  Jules Oar, MD  3:33 PM

## 2023-04-19 DIAGNOSIS — I442 Atrioventricular block, complete: Secondary | ICD-10-CM | POA: Diagnosis not present

## 2023-04-19 DIAGNOSIS — N1832 Chronic kidney disease, stage 3b: Secondary | ICD-10-CM | POA: Diagnosis not present

## 2023-04-19 DIAGNOSIS — E8729 Other acidosis: Secondary | ICD-10-CM | POA: Diagnosis not present

## 2023-04-19 DIAGNOSIS — E43 Unspecified severe protein-calorie malnutrition: Secondary | ICD-10-CM | POA: Insufficient documentation

## 2023-04-19 DIAGNOSIS — R570 Cardiogenic shock: Secondary | ICD-10-CM | POA: Diagnosis not present

## 2023-04-19 DIAGNOSIS — E875 Hyperkalemia: Secondary | ICD-10-CM | POA: Diagnosis not present

## 2023-04-19 DIAGNOSIS — N179 Acute kidney failure, unspecified: Secondary | ICD-10-CM | POA: Diagnosis not present

## 2023-04-19 DIAGNOSIS — Q246 Congenital heart block: Secondary | ICD-10-CM

## 2023-04-19 LAB — POTASSIUM
Potassium: 5.8 mmol/L — ABNORMAL HIGH (ref 3.5–5.1)
Potassium: 5.1 mmol/L (ref 3.5–5.1)
Potassium: 4.7 mmol/L (ref 3.5–5.1)
Potassium: 4.9 mmol/L (ref 3.5–5.1)

## 2023-04-19 LAB — CBC
HCT: 20 % — ABNORMAL LOW (ref 39.0–52.0)
Hemoglobin: 6.7 g/dL — CL (ref 13.0–17.0)
MCH: 29.3 pg (ref 26.0–34.0)
MCHC: 33.5 g/dL (ref 30.0–36.0)
MCV: 87.3 fL (ref 80.0–100.0)
Platelets: 116 10*3/uL — ABNORMAL LOW (ref 150–400)
RBC: 2.29 MIL/uL — ABNORMAL LOW (ref 4.22–5.81)
RDW: 20.8 % — ABNORMAL HIGH (ref 11.5–15.5)
WBC: 4.9 10*3/uL (ref 4.0–10.5)
nRBC: 0.6 % — ABNORMAL HIGH (ref 0.0–0.2)

## 2023-04-19 LAB — GLUCOSE, CAPILLARY: Glucose-Capillary: 108 mg/dL — ABNORMAL HIGH (ref 70–99)

## 2023-04-19 LAB — HEMOGLOBIN AND HEMATOCRIT, BLOOD
HCT: 24.7 % — ABNORMAL LOW (ref 39.0–52.0)
Hemoglobin: 8.2 g/dL — ABNORMAL LOW (ref 13.0–17.0)

## 2023-04-19 LAB — BASIC METABOLIC PANEL WITH GFR
Anion gap: 12 (ref 5–15)
Anion gap: 16 — ABNORMAL HIGH (ref 5–15)
BUN: 102 mg/dL — ABNORMAL HIGH (ref 8–23)
BUN: 106 mg/dL — ABNORMAL HIGH (ref 8–23)
CO2: 17 mmol/L — ABNORMAL LOW (ref 22–32)
CO2: 17 mmol/L — ABNORMAL LOW (ref 22–32)
Calcium: 8 mg/dL — ABNORMAL LOW (ref 8.9–10.3)
Calcium: 8.3 mg/dL — ABNORMAL LOW (ref 8.9–10.3)
Chloride: 109 mmol/L (ref 98–111)
Chloride: 104 mmol/L (ref 98–111)
Creatinine, Ser: 5.61 mg/dL — ABNORMAL HIGH (ref 0.61–1.24)
Creatinine, Ser: 5.73 mg/dL — ABNORMAL HIGH (ref 0.61–1.24)
GFR, Estimated: 9 mL/min — ABNORMAL LOW (ref >60)
GFR, Estimated: 9 mL/min — ABNORMAL LOW (ref >60)
Glucose, Bld: 131 mg/dL — ABNORMAL HIGH (ref 70–99)
Glucose, Bld: 149 mg/dL — ABNORMAL HIGH (ref 70–99)
Potassium: 5.7 mmol/L — ABNORMAL HIGH (ref 3.5–5.1)
Potassium: 4.9 mmol/L (ref 3.5–5.1)
Sodium: 138 mmol/L (ref 135–145)
Sodium: 137 mmol/L (ref 135–145)

## 2023-04-19 LAB — PREPARE RBC (CROSSMATCH)

## 2023-04-19 LAB — MAGNESIUM: Magnesium: 1.6 mg/dL — ABNORMAL LOW (ref 1.7–2.4)

## 2023-04-19 LAB — PHOSPHORUS: Phosphorus: 4.7 mg/dL — ABNORMAL HIGH (ref 2.5–4.6)

## 2023-04-19 LAB — LACTIC ACID, PLASMA: Lactic Acid, Venous: 4.4 mmol/L (ref 0.5–1.9)

## 2023-04-19 MED ORDER — DEXTROSE 50 % IV SOLN
1.0000 | Freq: Once | INTRAVENOUS | Status: AC
  Administered 2023-04-19: 50 mL via INTRAVENOUS
  Filled 2023-04-19: qty 50

## 2023-04-19 MED ORDER — INSULIN ASPART 100 UNIT/ML IV SOLN
10.0000 [IU] | Freq: Once | INTRAVENOUS | Status: AC
  Administered 2023-04-19: 10 [IU] via INTRAVENOUS

## 2023-04-19 MED ORDER — PANTOPRAZOLE SODIUM 40 MG PO TBEC: 40.0000 mg | DELAYED_RELEASE_TABLET | ORAL | Status: DC

## 2023-04-19 MED ORDER — SODIUM CHLORIDE 0.9% IV SOLUTION
Freq: Once | INTRAVENOUS | Status: DC
Start: 2023-04-19 — End: 2023-04-21

## 2023-04-19 MED ORDER — CHLORHEXIDINE GLUCONATE CLOTH 2 % EX PADS
6.0000 | MEDICATED_PAD | Freq: Every day | CUTANEOUS | Status: DC
  Administered 2023-04-19 – 2023-04-20 (×2): 6 via TOPICAL

## 2023-04-19 MED ORDER — CALCIUM GLUCONATE-NACL 2-0.675 GM/100ML-% IV SOLN
2.0000 g | Freq: Once | INTRAVENOUS | Status: AC
Start: 2023-04-19 — End: 2023-04-19
  Administered 2023-04-19: 2000 mg via INTRAVENOUS
  Filled 2023-04-19: qty 100

## 2023-04-19 MED ORDER — BOOST / RESOURCE BREEZE PO LIQD CUSTOM: 1.0000 | Freq: Three times a day (TID) | ORAL | Status: DC

## 2023-04-19 MED ORDER — ADULT MULTIVITAMIN W/MINERALS CH
1.0000 | ORAL_TABLET | Freq: Every day | ORAL | Status: DC
  Administered 2023-04-19: 1
  Filled 2023-04-19 (×2): qty 1

## 2023-04-19 MED ORDER — ENSURE ENLIVE PO LIQD
237.0000 mL | Freq: Three times a day (TID) | ORAL | Status: DC
  Administered 2023-04-19: 237 mL via ORAL

## 2023-04-19 MED ORDER — MEDIHONEY WOUND/BURN DRESSING EX PSTE
1.0000 | PASTE | Freq: Every day | CUTANEOUS | Status: DC
  Administered 2023-04-19 – 2023-04-20 (×2): 1 via TOPICAL
  Filled 2023-04-19: qty 44

## 2023-04-19 MED ORDER — MAGNESIUM SULFATE 4 GM/100ML IV SOLN
4.0000 g | Freq: Once | INTRAVENOUS | Status: AC
  Administered 2023-04-19: 4 g via INTRAVENOUS
  Filled 2023-04-19: qty 100

## 2023-04-19 NOTE — TOC Initial Note (Signed)
 Transition of Care [facility]) - Initial/Assessment Note    Patient Details  Name: [name] MRN: [id number] Date of Birth: Dec 14, 1925  Transition of Care [facility]) CM/SW Contact:    Justina Delcia Czar, RN Phone Number: [phone] 04/19/2023, 12:35 PM  Clinical Narrative:                 TOC CM spoke to pt, wife and son at bedside. Pt gave permission to speak to wife. Wife states she had [employer] in the past. He has wheelchair, and RW at home.  Will continue to follow for dc needs.   Expected Discharge Plan: Skilled Nursing Facility Barriers to Discharge: Continued Medical Work up   Patient Goals and CMS Choice Patient states their goals for this hospitalization and ongoing recovery are:: wants to feel better CMS Medicare.gov Compare Post Acute Care list provided to:: Patient Represenative (must comment) Choice offered to / list presented to : Spouse      Expected Discharge Plan and Services   Discharge Planning Services: CM Consult   Living arrangements for the past 2 months: Single Family Home                   Prior Living Arrangements/Services Living arrangements for the past 2 months: Single Family Home Lives with:: Spouse Patient language and need for interpreter reviewed:: Yes Do you feel safe going back to the place where you live?: Yes      Need for Family Participation in Patient Care: Yes (Comment) Care giver support system in place?: Yes (comment) Current home services: DME (cane, rolling walker, wheelchair) Criminal Activity/Legal Involvement Pertinent to Current Situation/Hospitalization: No - Comment as needed  Activities of Daily Living   ADL Screening (condition at time of admission) Independently performs ADLs?: No Does the patient have a NEW difficulty with bathing/dressing/toileting/self-feeding that is expected to last >3 days?: Yes (Initiates electronic notice to provider for possible OT consult) Does the patient have a NEW difficulty with  getting in/out of bed, walking, or climbing stairs that is expected to last >3 days?: Yes (Initiates electronic notice to provider for possible PT consult) Does the patient have a NEW difficulty with communication that is expected to last >3 days?: Yes (Initiates electronic notice to provider for possible SLP consult) Is the patient deaf or have difficulty hearing?: Yes Does the patient have difficulty seeing, even when wearing glasses/contacts?: No Does the patient have difficulty concentrating, remembering, or making decisions?: No  Permission Sought/Granted Permission sought to share information with : Case Manager, Family Supports, PCP Permission granted to share information with : Yes, Verbal Permission Granted  Share Information with NAME: Brylin Stanislawski  Permission granted to share info w AGENCY: Home Health  Permission granted to share info w Relationship: wife  Permission granted to share info w Contact Information: [phone]  Emotional Assessment Appearance:: Appears stated age Attitude/Demeanor/Rapport: Engaged Affect (typically observed): Accepting Orientation: : Oriented to Self, Oriented to Place, Oriented to  Time, Oriented to Situation   Psych Involvement: No (comment)  Admission diagnosis:  Hyperkalemia [E87.5] Bradycardia [R00.1] Patient Active Problem List   Diagnosis Date Noted   Hyperkalemia 04/18/2023   Cellulitis, leg 04/04/2023   Necrotizing fasciitis (HCC) 04/04/2023   Iron  deficiency anemia 03/20/2023   Renal insufficiency    Lung nodule    GI bleed 06/14/2017   Displaced fracture of distal end of left radius 03/19/2017   Hyponatremia 02/19/2017   GIB (gastrointestinal bleeding) 06/30/2013   Postoperative anemia due  to acute blood loss 02/18/2012   Osteoarthritis of left knee 02/15/2012   Hyperlipidemia    Normocytic anemia 05/01/2011   Dyspnea 04/25/2011   Mitral regurgitation, severe 04/25/2011   CAD, single vessel RCA 04/25/2011   HTN  (hypertension) 04/25/2011   Dyslipidemia 04/25/2011   Chronic renal insufficiency, stage III (moderate) (HCC) 04/25/2011   Gout 04/25/2011   PCP:  Verdia Lombard, MD Pharmacy:   [id number] [address] [place], [place] - [address] [place] [address] Phone: [phone] Fax: [phone]     Social Drivers of Health (SDOH) Social History: SDOH Screenings   Food Insecurity: Patient Unable To Answer (04/18/2023)  Housing: Patient Unable To Answer (04/18/2023)  Transportation Needs: Patient Unable To Answer (04/18/2023)  Utilities: Not At Risk (04/09/2023)  Depression (PHQ2-9): Low Risk  (04/09/2023)  Social Connections: Unknown (04/18/2023)  Recent Concern: Social Connections - Moderately Isolated (04/04/2023)  Tobacco Use: Medium Risk (04/18/2023)   SDOH Interventions:     Readmission Risk Interventions     No data to display

## 2023-04-19 NOTE — Consult Note (Signed)
 WOC Nurse Consult Note: patient is known to [facility] team previous admission with stage 3 Pressure Injury; patient with likely PVD lower legs Reason for Consult: sacral wound/legs Wound type: 1.  Stage 3 Pressure Injury sacrum 2.  Scattered full and partial thickness skin loss B lower legs likely vascular component  Pressure Injury POA: Yes Measurement: see nursing flowsheet  Wound azi:djrmlf 50% yellow 50% red; scattered brown/black dry scabs noted to B anterior legs  Drainage (amount, consistency, odor) see nursing flowsheet  Periwound: legs with dark chronic discoloration, 1+ edema per MD note  Dressing procedure/placement/frequency:  Cleanse sacral wound with Vashe wound cleanser Soila [phone]), do not rinse and allow to air dry. Apply Medihoney to wound bed daily, cover with dry gauze and silicone foam. Cleanse B lower legs with soap and water, dry and apply Xeroform gauze Soila [phone]) to anterior lower legs daily, cover with ABD pad and secure with Kerlix roll gauze beginning right above toes and ending right below knees. May apply Ace bandage wrapped in same fashion as Kerlix for light compression.   POC discussed with bedside nurse. WOC team will not follow.  Re-consult if further needs arise.   Thank you,    Powell Bar MSN, RN-BC, [employer] [phone]

## 2023-04-19 NOTE — Progress Notes (Signed)
 Patient ID: [name], male   DOB: Jan 03, 1925, 89 y.o.   MRN: [id number] S: Frail, ill-appearing, but no new complaints O:BP (!) 120/50   Pulse 62   Temp (!) 96.3 F (35.7 C) (Axillary)   Resp (!) 22   Ht 6' (1.829 m)   Wt 73 kg   SpO2 94%   BMI 21.83 kg/m   Intake/Output Summary (Last 24 hours) at 04/19/2023 0837 Last data filed at 04/19/2023 0800 Gross per 24 hour  Intake 3939.61 ml  Output 0 ml  Net 3939.61 ml   Intake/Output: I/O last 3 completed shifts: In: 3343.2 [I.V.:1682.1; NG/GT:180; IV Piggyback:1481] Out: 0   Intake/Output this shift:  Total I/O In: 596.4 [I.V.:429.9; IV Piggyback:166.5] Out: -  Weight change:  WUJ:WJXBJ, ill-appearing in NAD CVS: bradycardic at 57 Resp: CTA Abd: +BS, soft, NT/ND Ext: scattered skin loss of bilateral lower ext, no edema  Recent Labs  Lab 04/18/23 1157 04/18/23 1249 04/18/23 1314 04/18/23 1751 04/18/23 2034 04/19/23 0247 04/19/23 0248  NA 136  --   --  138  --  138  --   K 6.1*  --  6.4* 5.6*  5.6* 5.6* 5.7* 5.8*  CL 109  --   --  111  --  109  --   CO2 14*  --   --  11*  --  17*  --   GLUCOSE 119*  --   --  122*  --  131*  --   BUN 102*  --   --  105*  --  102*  --   CREATININE 5.17*  --   --  5.42*  --  5.61*  --   ALBUMIN  --  2.3*  --  2.1*  --   --   --   CALCIUM  8.4*  --   --  8.4*  --  8.0*  --   PHOS  --   --   --  4.6  --  4.7*  --   AST  --  34  --   --   --   --   --   ALT  --  18  --   --   --   --   --    Liver Function Tests: Recent Labs  Lab 04/18/23 1249 04/18/23 1751  AST 34  --   ALT 18  --   ALKPHOS 62  --   BILITOT 0.3  --   PROT 4.8*  --   ALBUMIN 2.3* 2.1*   No results for input(s): "LIPASE", "AMYLASE" in the last 168 hours. No results for input(s): "AMMONIA" in the last 168 hours. CBC: Recent Labs  Lab 04/16/23 1352 04/18/23 1157 04/19/23 0247  WBC 11.5* 7.4 4.9  NEUTROABS 10.8*  --   --   HGB 8.9* 7.4* 6.7*  HCT 26.7* 23.1* 20.0*  MCV 89.3 91.3 87.3  PLT 177  129* 116*   Cardiac Enzymes: No results for input(s): "CKTOTAL", "CKMB", "CKMBINDEX", "TROPONINI" in the last 168 hours. CBG: Recent Labs  Lab 04/19/23 0520  GLUCAP 108*    Iron  Studies: No results for input(s): "IRON ", "TIBC", "TRANSFERRIN", "FERRITIN" in the last 72 hours. Studies/Results: DG Abd Portable 1V Result Date: 04/18/2023 CLINICAL DATA:  OG tube placement EXAM: PORTABLE ABDOMEN - 1 VIEW COMPARISON:  02/20/2017 FINDINGS: OG tube in place with the tip in the mid to distal stomach. No confluent airspace opacities or effusions. IMPRESSION: OG tube in the mid to distal stomach.  Electronically Signed   By: Janeece Mechanic M.D.   On: 04/18/2023 21:39   US  RENAL Result Date: 04/18/2023 CLINICAL DATA:  Acute kidney injury EXAM: RENAL / URINARY TRACT ULTRASOUND COMPLETE COMPARISON:  09/19/2004 FINDINGS: Right Kidney: Renal measurements: 10.1 x 5.1 x 6.3 cm = volume: 169 mL. Mild increased echogenicity the renal cortex without significant thinning. No mass or hydronephrosis visualized. Left Kidney: Renal measurements: 9.0 x 5.2 x 4.3 cm = volume: 106 mL. Mild increased echogenicity the renal cortex without significant thinning. No mass or hydronephrosis visualized. Bladder: Appears normal for degree of bladder distention. Other: Prostate is mildly enlarged. IMPRESSION: Increased echogenicity of the renal cortices suggestive of chronic medical renal disease. Electronically Signed   By: Elester Grim M.D.   On: 04/18/2023 16:38   ECHOCARDIOGRAM COMPLETE Result Date: 04/18/2023    ECHOCARDIOGRAM REPORT   Patient Name:   [name] Date of Exam: 04/18/2023 Medical Rec #:  [id number]            Height:       72.0 in Accession #:    [id number]           Weight:       160.1 lb Date of Birth:  1925/11/09             BSA:          1.938 m Patient Age:    89 years             BP:           115/42 mmHg Patient Gender: M                    HR:           33 bpm. Exam Location:  Inpatient Procedure: 2D  Echo, Cardiac Doppler and Color Doppler (Both Spectral and Color            Flow Doppler were utilized during procedure). Indications:     Heart block, Complete I44.2  History:         Patient has prior history of Echocardiogram examinations, most                  recent 02/22/2016. CAD, COPD; Risk Factors:Hypertension.  Sonographer:     Astrid Blamer Referring Phys:  [id number] TAYLOR A PARCELLS Diagnosing Phys: Jules Oar MD IMPRESSIONS  1. Left ventricular ejection fraction, by estimation, is 60 to 65%. The left ventricle has normal function. The left ventricle has no regional wall motion abnormalities. Left ventricular diastolic parameters are indeterminate.  2. Right ventricular systolic function is mildly reduced. The right ventricular size is moderately enlarged. There is moderately elevated pulmonary artery systolic pressure. The estimated right ventricular systolic pressure is 50.8 mmHg.  3. Left atrial size was severely dilated.  4. Right atrial size was severely dilated.  5. The mitral valve is degenerative. Moderate mitral valve regurgitation. No evidence of mitral stenosis. There is mild prolapse of posterior leaflet of the mitral valve.  6. Tricuspid valve regurgitation is mild to moderate.  7. The aortic valve is tricuspid. There is mild calcification of the aortic valve. Aortic valve regurgitation is not visualized. Aortic valve sclerosis/calcification is present, without any evidence of aortic stenosis.  8. Pulmonic valve regurgitation is moderate. FINDINGS  Left Ventricle: Left ventricular ejection fraction, by estimation, is 60 to 65%. The left ventricle has normal function. The left ventricle has no regional wall motion abnormalities. The left ventricular  internal cavity size was normal in size. There is  no left ventricular hypertrophy. Left ventricular diastolic parameters are indeterminate. Right Ventricle: The right ventricular size is moderately enlarged. No increase in right ventricular  wall thickness. Right ventricular systolic function is mildly reduced. There is moderately elevated pulmonary artery systolic pressure. The tricuspid regurgitant velocity is 3.27 m/s, and with an assumed right atrial pressure of 8 mmHg, the estimated right ventricular systolic pressure is 50.8 mmHg. Left Atrium: Left atrial size was severely dilated. Right Atrium: Right atrial size was severely dilated. Pericardium: There is no evidence of pericardial effusion. Mitral Valve: The mitral valve is degenerative in appearance. There is mild prolapse of posterior leaflet of the mitral valve. Moderate mitral valve regurgitation. No evidence of mitral valve stenosis. Tricuspid Valve: The tricuspid valve is normal in structure. Tricuspid valve regurgitation is mild to moderate. No evidence of tricuspid stenosis. Aortic Valve: The aortic valve is tricuspid. There is mild calcification of the aortic valve. Aortic valve regurgitation is not visualized. Aortic valve sclerosis/calcification is present, without any evidence of aortic stenosis. Aortic valve mean gradient measures 6.0 mmHg. Aortic valve peak gradient measures 12.0 mmHg. Aortic valve area, by VTI measures 2.11 cm. Pulmonic Valve: The pulmonic valve was normal in structure. Pulmonic valve regurgitation is moderate. No evidence of pulmonic stenosis. Aorta: The aortic root is normal in size and structure. IAS/Shunts: No atrial level shunt detected by color flow Doppler.  LEFT VENTRICLE PLAX 2D LVIDd:         5.60 cm   Diastology LVIDs:         3.70 cm   LV e' medial:    10.80 cm/s LV PW:         0.90 cm   LV E/e' medial:  12.0 LV IVS:        0.90 cm   LV e' lateral:   9.79 cm/s LVOT diam:     1.90 cm   LV E/e' lateral: 13.3 LV SV:         85 LV SV Index:   44 LVOT Area:     2.84 cm  RIGHT VENTRICLE RV S prime:     13.80 cm/s TAPSE (M-mode): 2.6 cm LEFT ATRIUM              Index        RIGHT ATRIUM           Index LA Vol (A2C):   132.0 ml 68.11 ml/m  RA Area:      24.50 cm LA Vol (A4C):   100.0 ml 51.60 ml/m  RA Volume:   63.70 ml  32.87 ml/m LA Biplane Vol: 115.0 ml 59.34 ml/m  AORTIC VALVE AV Area (Vmax):    2.11 cm AV Area (Vmean):   1.91 cm AV Area (VTI):     2.11 cm AV Vmax:           173.00 cm/s AV Vmean:          116.000 cm/s AV VTI:            0.401 m AV Peak Grad:      12.0 mmHg AV Mean Grad:      6.0 mmHg LVOT Vmax:         129.00 cm/s LVOT Vmean:        78.100 cm/s LVOT VTI:          0.299 m LVOT/AV VTI ratio: 0.75  AORTA Ao Root diam: 3.10 cm MITRAL VALVE  TRICUSPID VALVE MV Area (PHT): 3.10 cm     TR Peak grad:   42.8 mmHg MV Decel Time: 245 msec     TR Vmax:        327.00 cm/s MV E velocity: 130.00 cm/s MV A velocity: 60.30 cm/s   SHUNTS MV E/A ratio:  2.16         Systemic VTI:  0.30 m                             Systemic Diam: 1.90 cm Jules Oar MD Electronically signed by Jules Oar MD Signature Date/Time: 04/18/2023/2:33:12 PM    Final (Updated)    DG Chest Portable 1 View Result Date: 04/18/2023 CLINICAL DATA:  Bradycardia, weakness EXAM: PORTABLE CHEST 1 VIEW COMPARISON:  04/04/2023 FINDINGS: Single frontal view of the chest demonstrates stable enlargement of the cardiac silhouette. Stable scarring at the left lung base. No acute airspace disease, effusion, or pneumothorax. No acute bony abnormality. IMPRESSION: 1. Stable chest, no acute process. Electronically Signed   By: Bobbye Burrow M.D.   On: 04/18/2023 14:31    sodium chloride    Intravenous Once   Chlorhexidine  Gluconate Cloth  6 each Topical Daily   famotidine   10 mg Per Tube QHS   ferrous sulfate   325 mg Oral BID WC   leptospermum manuka honey  1 Application Topical Daily   sodium zirconium cyclosilicate   10 g Per Tube TID    BMET    Component Value Date/Time   NA 138 04/19/2023 0247   K 5.8 (H) 04/19/2023 0248   CL 109 04/19/2023 0247   CO2 17 (L) 04/19/2023 0247   GLUCOSE 131 (H) 04/19/2023 0247   BUN 102 (H) 04/19/2023 0247   CREATININE  5.61 (H) 04/19/2023 0247   CREATININE 1.86 (H) 08/17/2020 1119   CALCIUM  8.0 (L) 04/19/2023 0247   GFRNONAA 9 (L) 04/19/2023 0247   GFRNONAA 33 (L) 08/17/2020 1119   GFRAA 39 (L) 06/15/2017 0404   CBC    Component Value Date/Time   WBC 4.9 04/19/2023 0247   RBC 2.29 (L) 04/19/2023 0247   HGB 6.7 (LL) 04/19/2023 0247   HGB 8.9 (L) 04/16/2023 1352   HCT 20.0 (L) 04/19/2023 0247   PLT 116 (L) 04/19/2023 0247   PLT 177 04/16/2023 1352   MCV 87.3 04/19/2023 0247   MCH 29.3 04/19/2023 0247   MCHC 33.5 04/19/2023 0247   RDW 20.8 (H) 04/19/2023 0247   LYMPHSABS 0.2 (L) 04/16/2023 1352   MONOABS 0.4 04/16/2023 1352   EOSABS 0.0 04/16/2023 1352   BASOSABS 0.0 04/16/2023 1352     Assessment/Plan:  AKI/CKD stage [name] - presumably combination of volume depletion, hypotension in setting of concomitant ACE inhibition.  Renal US  with increased echogenicity c/w chronic medical renal disease, no obstruction.  Bladder scan with 60 mL this morning.   Agree he is a poor candidate for HD given advanced age, multiple co-morbidities and poor functional status.  Will manage medically.  Off lisinopril  and currently receiving IVF's and measures to lower K. Goals of care discussion had by advanced heart failure team and PCCM.  Agree with no dialysis and may need to transition to comfort care.  Renal dose meds for eGFR <15.   Avoid nephrotoxic medications including NSAIDs and iodinated intravenous contrast exposure unless the latter is absolutely indicated.   Preferred narcotic agents for pain control are hydromorphone , fentanyl , and methadone. Morphine should not be used.  Avoid Baclofen and avoid oral sodium phosphate  and magnesium  citrate based laxatives / bowel preps.  Continue strict Input and Output monitoring. Will monitor the patient closely with you and intervene or adjust therapy as indicated by changes in clinical status/labs    Hyperkalemia - due to #1 and ACE.  Aggressive fluid resuscitation with  bicarb gtt.  Given albuterol  neb, IV insulin /D50, calcium  and is now on lokelma  10 grams tid.  Continue with conservative management and agree with holding off on dialysis for now. Cardiogenic shock - due to CHB. Advanced heart failure team following.  Not a PPM candidate.  Can use dopamine  if needed for bradycardia.  Complete heart block - in setting of hyperkalemia. Anemia - hgb down to 6.7 and receiving blood this am. RLE necrotizing fasciitis - completed abx course  Benjamin Brands, MD [facility]

## 2023-04-19 NOTE — Progress Notes (Signed)
 NAME:  [name], MRN:  [id number], DOB:  07-28-1925, LOS: 1 ADMISSION DATE:  04/18/2023, CONSULTATION DATE:  04/18/2023  REFERRING MD: Jiles Mote, PA-C, CHIEF COMPLAINT:  Hyperkalemia    History of Present Illness:  A 89 yr old male patient with CAD, HTN, dyslipidemia, CKD stage-3b, gout, chronic fe def anemia, moderate PCM who presented to ED with generalized weakness and low oral intake for 2 days. He was discharged from [facility] 04/08/2023 after 4 days admission for right lower limb necrotizing fasciitis, s/p 2 week course of Linezolid . For 2 days, he has low oral intake, fatigue, and 2 loose BM without melena or blood per rectum. He has no f/c/r, N/V, abd pain, CP, SOB, fall, syncope, dizziness, wheezing, cough, or dysuria. Continues to take home meds. ACEI and Bactrim were d/c when he was admitted to [facility] due to AKI. Baseline Cr is 1.7 and had Cr 2 in [facility]. He was found in slow Afib (bradycardia 30s), AKI, Bicarb 14, and K 6.4. Given 500 cc NS 0.9%, Albuterol  neb, D50 ampoule, insulin  5 units IV, and Ca gluconate 1 gm. Lokelma  is ordered.  Seen by cardiology and placed in SQ pacing PRN.  No smoking or illicit drug use, and drinks one alcoholic drink daily.    Pertinent  Medical History  CAD, HTN, dyslipidemia, CKD stage-3b, gout, chronic fe def anemia, moderate PCM, necrotizing fasciitis (4/4-07/2023)   Significant Hospital Events: Including procedures, antibiotic start and stop dates in addition to other pertinent events   4/17:  NS 0.9% 500 cc bolus, Albuterol  neb, D50 ampoule, insulin  5 units IV, and Ca gluconate 1 gm. Lokelma  is ordered. Seen by cardiology and placed in SQ pacing PRN.  Interim History / Subjective:  OGT placed as pt would not swallow meds Hgb down 6.7, T&S sent, pending transfusion of 1 PRBC, no signs of bleeding Remains anuric, 60's on bladder scan, slightly worse BUN/ sCr No further bradycardia, dopamine  not needed overnight No complaints per pt, remains  confused  Objective   Blood pressure (!) 117/45, pulse 67, temperature (!) 96 F (35.6 C), temperature source Axillary, resp. rate (!) 22, height 6' (1.829 m), weight 73 kg, SpO2 96%.        Intake/Output Summary (Last 24 hours) at 04/19/2023 0739 Last data filed at 04/19/2023 4098 Gross per 24 hour  Intake 3343.18 ml  Output 0 ml  Net 3343.18 ml   Filed Weights   04/18/23 1158 04/19/23 0324  Weight: 72.6 kg 73 kg    Examination: General:  Frail appearing thin elderly male sitting upright in bed in NAD HEENT: MM pink/moist, R NGT, pupils 3/r, anicteric Neuro: Awake, oriented to person, occ place, not time, MAE- generalized weakness CV: irir, +murmur, rate 60s PULM:  non labored, clear anteriorly, diminished bases, RA GI: soft, bs+, NT, purwick Extremities: warm/dry, cachetic, no tibial LE edema, trace pedal Skin:  multiple LE scabs, no erythema, posterior deferred  Afebrile Anuric  Net +3.3L Labs> K 5.7, bicarb 17, BUN/ sCr 102/ 5.61, mag 1.6, phos 4.7, lactic 5.7> 4.4, Hgb 6.7/ 20, plts 116  Resolved Hospital Problem list     Assessment & Plan:  AKI/CKD-3b Hyperkalemia - renal US  c/w medical disease, no hydro/ obstruction - cont bicarb  - lokelmia per tube, s/p calcium   - trend K q4, BMET q12 - cont bladder scan- minimal  - nephrology following, appreciate recs  Lactic acidosis due to bradycardia, hypotension - down trending, continue support  Thrombocytopenia, chronic  Fe def anemia, chronic - no obvious source, pending transfusion 1 u PRBC for goal > 7, repeat H/H post transfusion, trend CBC daily  - Fe per tube - pending Stool H/O  CAD, HTN, dyslipidemia, slow Afib, CHB placed in SQ pacing PRN, in setting of metabolic derangements Echo: EF 60%, RVSP 50.8 mmHg, mildly reduced RV, mod MR w/ mild MVP, mild/ mod TR, mod PR - HF following, appreciate input, remains in 2AVB  vs ?intermittent afib overnight - dopamine  prn for bradycardia  Gout - Holding home  meds for now  Moderate PCM  - RD consulted  Sacral bedsore - WOC consulted  GOC - wife verifies no CPR, intubation, and no shocks, ok with IV support   Best Practice (right click and "Reselect all SmartList Selections" daily)   Diet/type: pending SLP DVT prophylaxis SCD Pressure ulcer(s): present on admission  GI prophylaxis: H2B Lines: N/A Foley:  N/A Code Status:  DNR/ DNI Last date of multidisciplinary goals of care discussion []  4/18  Wife updated at bedside 4/18  Labs   CBC: Recent Labs  Lab 04/16/23 1352 04/18/23 1157 04/19/23 0247  WBC 11.5* 7.4 4.9  NEUTROABS 10.8*  --   --   HGB 8.9* 7.4* 6.7*  HCT 26.7* 23.1* 20.0*  MCV 89.3 91.3 87.3  PLT 177 129* 116*    Basic Metabolic Panel: Recent Labs  Lab 04/18/23 1157 04/18/23 1249 04/18/23 1314 04/18/23 1751 04/18/23 2034 04/19/23 0247 04/19/23 0248  NA 136  --   --  138  --  138  --   K 6.1*  --  6.4* 5.6*  5.6* 5.6* 5.7* 5.8*  CL 109  --   --  111  --  109  --   CO2 14*  --   --  11*  --  17*  --   GLUCOSE 119*  --   --  122*  --  131*  --   BUN 102*  --   --  105*  --  102*  --   CREATININE 5.17*  --   --  5.42*  --  5.61*  --   CALCIUM  8.4*  --   --  8.4*  --  8.0*  --   MG  --  1.8  --   --   --  1.6*  --   PHOS  --   --   --  4.6  --  4.7*  --    GFR: Estimated Creatinine Clearance: 8.1 mL/min (A) (by C-G formula based on SCr of 5.61 mg/dL (H)). Recent Labs  Lab 04/16/23 1352 04/18/23 1157 04/18/23 1305 04/18/23 1751 04/18/23 2324 04/19/23 0247  WBC 11.5* 7.4  --   --   --  4.9  LATICACIDVEN  --   --  2.5* 5.7* 4.4*  --     Liver Function Tests: Recent Labs  Lab 04/18/23 1249 04/18/23 1751  AST 34  --   ALT 18  --   ALKPHOS 62  --   BILITOT 0.3  --   PROT 4.8*  --   ALBUMIN 2.3* 2.1*   No results for input(s): "LIPASE", "AMYLASE" in the last 168 hours. No results for input(s): "AMMONIA" in the last 168 hours.  ABG    Component Value Date/Time   PHART 7.391 04/30/2011  1058   PCO2ART 31.3 (L) 04/30/2011 1058   PO2ART 96.0 04/30/2011 1058   HCO3 19.2 (L) 04/30/2011 1100   TCO2 20 04/30/2011 1100   ACIDBASEDEF  6.0 (H) 04/30/2011 1100   O2SAT 66.0 04/30/2011 1100     Coagulation Profile: No results for input(s): "INR", "PROTIME" in the last 168 hours.  Cardiac Enzymes: No results for input(s): "CKTOTAL", "CKMB", "CKMBINDEX", "TROPONINI" in the last 168 hours.  HbA1C: No results found for: "HGBA1C"  CBG: Recent Labs  Lab 04/19/23 0520  GLUCAP 108*  Allergies Allergies  Allergen Reactions   Tape Other (See Comments)    Please use SENSITIVE TAPE or COBAN WRAP- SKIN IS VERY FRAGILE!!   Sodium Bicarbonate  Other (See Comments)    Made the patient feel "unwell, not normal."     Home Medications  Prior to Admission medications   Medication Sig Start Date End Date Taking? Authorizing Provider  acetaminophen  (TYLENOL ) 325 MG tablet Take 650 mg by mouth every 6 (six) hours as needed for mild pain or headache.    [provider]  allopurinol  (ZYLOPRIM ) 300 MG tablet Take 150 mg by mouth daily.     [provider]  aspirin  EC 81 MG tablet Take 81 mg by mouth See admin instructions. Take 81 mg by mouth on Mondays and Wednesdays- swallow whole    [provider]  atorvastatin  (LIPITOR) 10 MG tablet TAKE 1 TABLET (10 MG TOTAL) BY MOUTH DAILY AT 6 PM. 05/17/22   Avanell Leigh, MD  diltiazem  (CARDIZEM  CD) 120 MG 24 hr capsule Take 1 capsule (120 mg total) by mouth daily. Please call [phone] to schedule a  July appointment for future refills. Thank you. Patient taking differently: Take 120 mg by mouth in the morning. 02/12/23 05/13/23  Avanell Leigh, MD  ketoconazole (NIZORAL) 2 % cream Apply 1 Application topically See admin instructions. Apply to affected area of the buttocks region 2 times a day as directed 04/02/23   [provider]  leptospermum manuka honey (MEDIHONEY) PSTE paste Apply 1 Application topically  daily. 04/09/23   Leona Rake, MD  linezolid  (ZYVOX ) 600 MG tablet Take 1 tablet (600 mg total) by mouth every 12 (twelve) hours for 10 days. 04/08/23 04/18/23  Manandhar, Sabina, MD  liver oil-zinc  oxide (DESITIN) 40 % ointment Apply topically 3 (three) times daily. 04/08/23   Leona Rake, MD  omeprazole  (PRILOSEC  OTC) 20 MG tablet Take 20 mg by mouth every other day.    [provider]  OVER THE COUNTER MEDICATION Place 1 drop into both eyes See admin instructions. Thera Tears- Instill 1 drop into both eyes three times a day as needed for dryness    [provider]  polyethylene glycol (MIRALAX  / GLYCOLAX ) packet Take 17 g by mouth daily as needed for mild constipation or moderate constipation.    [provider]  UNKNOWN TO PATIENT Place 1 drop into both eyes See admin instructions. Unnamed allergy relief eye drops- Instill 1 drop into both eyes three times a day as needed for itching and/or allergies    [provider]     Critical care time: 35 min        Early Glisson, MSN, AG-ACNP-BC [facility] 04/19/2023, 7:39 AM  See Amion for pager If no response to pager , please call [phone] until 7pm After 7:00 pm call Elink  [phone]

## 2023-04-19 NOTE — Progress Notes (Addendum)
 Advanced Heart Failure Rounding Note  Cardiologist: Lauro Portal, MD  Chief Complaint: CHB Subjective:    Overnight HR as low as 48, now in 60-70s in 2AVB.  BP improved with higher rates.  Anuric. K 5.7, sCr up 5.61. hgb 6.7  Sitting up in bed this morning. Able to answer yes/no questions. Drowsy. Denies SOB.   Objective:    Weight Range: 73 kg Body mass index is 21.83 kg/m.   Vital Signs:   Temp:  [90 F (32.2 C)-98.5 F (36.9 C)] 98.5 F (36.9 C) (04/18 0330) Pulse Rate:  [31-80] 69 (04/18 0630) Resp:  [10-30] 25 (04/18 0630) BP: (101-140)/(30-100) 128/51 (04/18 0630) SpO2:  [88 %-100 %] 97 % (04/18 0630) Weight:  [72.6 kg-73 kg] 73 kg (04/18 0324)    Weight change: Filed Weights   04/18/23 1158 04/19/23 0324  Weight: 72.6 kg 73 kg   Intake/Output:  Intake/Output Summary (Last 24 hours) at 04/19/2023 0654 Last data filed at 04/19/2023 1610 Gross per 24 hour  Intake 3343.18 ml  Output 0 ml  Net 3343.18 ml    Physical Exam    General: Frail, critically-ill appearing. No distress on [place] Cardiac: JVP to jaw. S1 and S2 present. No murmurs or rub. Resp: fine crackles in uppers, lowers diminished Extremities: Warm and dry.  1+ edema. BLE ruddy scabbed Neuro: Alert and oriented x3. Affect pleasant. Moves all extremities without difficulty.  Telemetry   2nd deg heart block, predominantly type 2 HR in the 60s (personally reviewed)  EKG    No new to review  Labs    CBC Recent Labs    04/16/23 1352 04/18/23 1157 04/19/23 0247  WBC 11.5* 7.4 4.9  NEUTROABS 10.8*  --   --   HGB 8.9* 7.4* 6.7*  HCT 26.7* 23.1* 20.0*  MCV 89.3 91.3 87.3  PLT 177 129* 116*   Basic Metabolic Panel Recent Labs    96/04/54 1249 04/18/23 1314 04/18/23 1751 04/18/23 2034 04/19/23 0247 04/19/23 0248  NA  --   --  138  --  138  --   K  --    < > 5.6*  5.6*   < > 5.7* 5.8*  CL  --   --  111  --  109  --   CO2  --   --  11*  --  17*  --   GLUCOSE  --   --  122*   --  131*  --   BUN  --   --  105*  --  102*  --   CREATININE  --   --  5.42*  --  5.61*  --   CALCIUM   --   --  8.4*  --  8.0*  --   MG 1.8  --   --   --  1.6*  --   PHOS  --   --  4.6  --  4.7*  --    < > = values in this interval not displayed.   Liver Function Tests Recent Labs    04/18/23 1249 04/18/23 1751  AST 34  --   ALT 18  --   ALKPHOS 62  --   BILITOT 0.3  --   PROT 4.8*  --   ALBUMIN 2.3* 2.1*   Thyroid  Function Tests Recent Labs    04/18/23 1248  TSH 4.891*    Imaging   DG Abd Portable 1V Result Date: 04/18/2023 CLINICAL DATA:  OG tube placement EXAM:  PORTABLE ABDOMEN - 1 VIEW COMPARISON:  02/20/2017 FINDINGS: OG tube in place with the tip in the mid to distal stomach. No confluent airspace opacities or effusions. IMPRESSION: OG tube in the mid to distal stomach. Electronically Signed   By: Janeece Mechanic M.D.   On: 04/18/2023 21:39   US  RENAL Result Date: 04/18/2023 CLINICAL DATA:  Acute kidney injury EXAM: RENAL / URINARY TRACT ULTRASOUND COMPLETE COMPARISON:  09/19/2004 FINDINGS: Right Kidney: Renal measurements: 10.1 x 5.1 x 6.3 cm = volume: 169 mL. Mild increased echogenicity the renal cortex without significant thinning. No mass or hydronephrosis visualized. Left Kidney: Renal measurements: 9.0 x 5.2 x 4.3 cm = volume: 106 mL. Mild increased echogenicity the renal cortex without significant thinning. No mass or hydronephrosis visualized. Bladder: Appears normal for degree of bladder distention. Other: Prostate is mildly enlarged. IMPRESSION: Increased echogenicity of the renal cortices suggestive of chronic medical renal disease. Electronically Signed   By: Elester Grim M.D.   On: 04/18/2023 16:38   ECHOCARDIOGRAM COMPLETE Result Date: 04/18/2023    ECHOCARDIOGRAM REPORT   Patient Name:   [name] Date of Exam: 04/18/2023 Medical Rec #:  [id number]            Height:       72.0 in Accession #:    [id number]           Weight:       160.1 lb Date of Birth:   December 26, 1925             BSA:          1.938 m Patient Age:    89 years             BP:           115/42 mmHg Patient Gender: M                    HR:           33 bpm. Exam Location:  Inpatient Procedure: 2D Echo, Cardiac Doppler and Color Doppler (Both Spectral and Color            Flow Doppler were utilized during procedure). Indications:     Heart block, Complete I44.2  History:         Patient has prior history of Echocardiogram examinations, most                  recent 02/22/2016. CAD, COPD; Risk Factors:Hypertension.  Sonographer:     Astrid Blamer Referring Phys:  [id number] TAYLOR A PARCELLS Diagnosing Phys: Jules Oar MD IMPRESSIONS  1. Left ventricular ejection fraction, by estimation, is 60 to 65%. The left ventricle has normal function. The left ventricle has no regional wall motion abnormalities. Left ventricular diastolic parameters are indeterminate.  2. Right ventricular systolic function is mildly reduced. The right ventricular size is moderately enlarged. There is moderately elevated pulmonary artery systolic pressure. The estimated right ventricular systolic pressure is 50.8 mmHg.  3. Left atrial size was severely dilated.  4. Right atrial size was severely dilated.  5. The mitral valve is degenerative. Moderate mitral valve regurgitation. No evidence of mitral stenosis. There is mild prolapse of posterior leaflet of the mitral valve.  6. Tricuspid valve regurgitation is mild to moderate.  7. The aortic valve is tricuspid. There is mild calcification of the aortic valve. Aortic valve regurgitation is not visualized. Aortic valve sclerosis/calcification is present, without any evidence of aortic stenosis.  8.  Pulmonic valve regurgitation is moderate. FINDINGS  Left Ventricle: Left ventricular ejection fraction, by estimation, is 60 to 65%. The left ventricle has normal function. The left ventricle has no regional wall motion abnormalities. The left ventricular internal cavity size was normal in  size. There is  no left ventricular hypertrophy. Left ventricular diastolic parameters are indeterminate. Right Ventricle: The right ventricular size is moderately enlarged. No increase in right ventricular wall thickness. Right ventricular systolic function is mildly reduced. There is moderately elevated pulmonary artery systolic pressure. The tricuspid regurgitant velocity is 3.27 m/s, and with an assumed right atrial pressure of 8 mmHg, the estimated right ventricular systolic pressure is 50.8 mmHg. Left Atrium: Left atrial size was severely dilated. Right Atrium: Right atrial size was severely dilated. Pericardium: There is no evidence of pericardial effusion. Mitral Valve: The mitral valve is degenerative in appearance. There is mild prolapse of posterior leaflet of the mitral valve. Moderate mitral valve regurgitation. No evidence of mitral valve stenosis. Tricuspid Valve: The tricuspid valve is normal in structure. Tricuspid valve regurgitation is mild to moderate. No evidence of tricuspid stenosis. Aortic Valve: The aortic valve is tricuspid. There is mild calcification of the aortic valve. Aortic valve regurgitation is not visualized. Aortic valve sclerosis/calcification is present, without any evidence of aortic stenosis. Aortic valve mean gradient measures 6.0 mmHg. Aortic valve peak gradient measures 12.0 mmHg. Aortic valve area, by VTI measures 2.11 cm. Pulmonic Valve: The pulmonic valve was normal in structure. Pulmonic valve regurgitation is moderate. No evidence of pulmonic stenosis. Aorta: The aortic root is normal in size and structure. IAS/Shunts: No atrial level shunt detected by color flow Doppler.  LEFT VENTRICLE PLAX 2D LVIDd:         5.60 cm   Diastology LVIDs:         3.70 cm   LV e' medial:    10.80 cm/s LV PW:         0.90 cm   LV E/e' medial:  12.0 LV IVS:        0.90 cm   LV e' lateral:   9.79 cm/s LVOT diam:     1.90 cm   LV E/e' lateral: 13.3 LV SV:         85 LV SV Index:   44 LVOT  Area:     2.84 cm  RIGHT VENTRICLE RV S prime:     13.80 cm/s TAPSE (M-mode): 2.6 cm LEFT ATRIUM              Index        RIGHT ATRIUM           Index LA Vol (A2C):   132.0 ml 68.11 ml/m  RA Area:     24.50 cm LA Vol (A4C):   100.0 ml 51.60 ml/m  RA Volume:   63.70 ml  32.87 ml/m LA Biplane Vol: 115.0 ml 59.34 ml/m  AORTIC VALVE AV Area (Vmax):    2.11 cm AV Area (Vmean):   1.91 cm AV Area (VTI):     2.11 cm AV Vmax:           173.00 cm/s AV Vmean:          116.000 cm/s AV VTI:            0.401 m AV Peak Grad:      12.0 mmHg AV Mean Grad:      6.0 mmHg LVOT Vmax:         129.00 cm/s LVOT  Vmean:        78.100 cm/s LVOT VTI:          0.299 m LVOT/AV VTI ratio: 0.75  AORTA Ao Root diam: 3.10 cm MITRAL VALVE                TRICUSPID VALVE MV Area (PHT): 3.10 cm     TR Peak grad:   42.8 mmHg MV Decel Time: 245 msec     TR Vmax:        327.00 cm/s MV E velocity: 130.00 cm/s MV A velocity: 60.30 cm/s   SHUNTS MV E/A ratio:  2.16         Systemic VTI:  0.30 m                             Systemic Diam: 1.90 cm Jules Oar MD Electronically signed by Jules Oar MD Signature Date/Time: 04/18/2023/2:33:12 PM    Final (Updated)    DG Chest Portable 1 View Result Date: 04/18/2023 CLINICAL DATA:  Bradycardia, weakness EXAM: PORTABLE CHEST 1 VIEW COMPARISON:  04/04/2023 FINDINGS: Single frontal view of the chest demonstrates stable enlargement of the cardiac silhouette. Stable scarring at the left lung base. No acute airspace disease, effusion, or pneumothorax. No acute bony abnormality. IMPRESSION: 1. Stable chest, no acute process. Electronically Signed   By: Bobbye Burrow M.D.   On: 04/18/2023 14:31   Medications:    Scheduled Medications:  sodium chloride    Intravenous Once   Chlorhexidine  Gluconate Cloth  6 each Topical Daily   famotidine   10 mg Per Tube QHS   ferrous sulfate   325 mg Oral BID WC   sodium zirconium cyclosilicate   10 g Per Tube TID   Infusions:  sodium bicarbonate  150 mEq in  dextrose  5 % 1,150 mL infusion 100 mL/hr at 04/19/23 0342    PRN Medications: docusate sodium , polyethylene glycol  Patient Profile   89 y/o male CAD, mitral regurgitation, HTN, CKD3b. Recently admitted for cellulitis/necrotizing fascitis of RLE. Discharged on 04/08/23. Now here with kidney failure and CHB in the setting of hyperkalemia.   Assessment/Plan   Cardiogenic Shock  - in the setting of CHB - lactic acidosis 2.5>5.7>4.4 today - echo with EF 60-65%, mildly reduced RV, RVSP 50.8 mmHg, mod MR with prolapse of posterior valve, mild/mod TR, mod PR  2. Complete Heart Block - in the setting of hyperkalemia - not a PPM candidate - again in CHB yesterday evening with hypotension and rise in LA - overnight in 2AVB, predom Type 2 - can use dopamine  if needed for bradycardia, HR stable now in 60s.   3. AKI in CKD 3b - renal US  with signs of chronic disease - Cr 1.9>5.2. Cr continues to rise; likely in the setting of hypotension - anuric; not a candidate for HD - receiving large IV volumes - nephrology following - would initiate Palliative discussions early  4. Hyperkalemia - in the setting of AKI on CKD and anuria - K 5.6 this morning - Lokelma  tid - CTM serial BMET  5. Anemia - baseline hgb 8 - hgb 6.7 today - CCM transfusing 1 unit  Length of Stay: 1  CRITICAL CARE Performed by: [place] Lee  Total critical care time: 12 minutes  Critical care time was exclusive of separately billable procedures and treating other patients.  Critical care was necessary to treat or prevent imminent or life-threatening deterioration.  Critical care was time spent personally by  me on the following activities: development of treatment plan with patient and/or surrogate as well as nursing, discussions with consultants, evaluation of patient's response to treatment, examination of patient, obtaining history from patient or surrogate, ordering and performing treatments and interventions,  ordering and review of laboratory studies, ordering and review of radiographic studies, pulse oximetry and re-evaluation of patient's condition.  [place] Lee, NP  04/19/2023, 6:54 AM  Advanced Heart Failure Team Pager [phone] (M-F; 7a - 5p)  Please contact [facility] for night-coverage after hours (5p -7a ) and weekends on [email or web address]  Agree with above  Heart block improved. Now NSR   Remains critically ill with progressive renal failure and acidosis despite aggressive volume resuscitation  General:  Frail elderly No resp difficulty HEENT: normal Neck: supple. JVP 7-8. Carotids 2+ bilat; no bruits. No lymphadenopathy or thryomegaly appreciated. Cor: Regular rate & rhythm. No rubs, gallops or murmurs. Lungs: clear Abdomen: soft, nontender, nondistended. No hepatosplenomegaly. No bruits or masses. Good bowel sounds. Extremities: no cyanosis, clubbing, ,tr edema Chronic LE wounds/stasis dermatitits  Neuro: alert & orientedx3, cranial nerves grossly intact. moves all 4 extremities w/o difficulty. Affect pleasant  Heart block has resolved with treatment of hyperkalemia.   Continues with progressive renal failure. D/w CCM and Renal doubt he is candidate for HD.   Discussed ongoing supportive care vs switch to comfort care. They will discuss.   He is DNR/DNI.   Cardiology will sign off. Please call if needed.   CRITICAL CARE Performed by: Jules Oar  Total critical care time: 45 minutes  Critical care time was exclusive of separately billable procedures and treating other patients.  Critical care was necessary to treat or prevent imminent or life-threatening deterioration.  Critical care was time spent personally by me (independent of midlevel providers or residents) on the following activities: development of treatment plan with patient and/or surrogate as well as nursing, discussions with consultants, evaluation of patient's response to treatment, examination of  patient, obtaining history from patient or surrogate, ordering and performing treatments and interventions, ordering and review of laboratory studies, ordering and review of radiographic studies, pulse oximetry and re-evaluation of patient's condition.  Jules Oar, MD  9:33 AM

## 2023-04-19 NOTE — Progress Notes (Addendum)
 Initial Nutrition Assessment  DOCUMENTATION CODES:   Severe malnutrition in context of acute illness/injury  INTERVENTION:   Liberalize diet to dysphagia 1 with thin liquids and 2 L fluid restriction. Ensure Enlive po TID, each supplement provides 350 kcal and 20 grams of protein. MVI with minerals daily.  NUTRITION DIAGNOSIS:   Severe Malnutrition related to acute illness (leg infection) as evidenced by moderate fat depletion, moderate muscle depletion, percent weight loss (8% weight loss x 1 month).  GOAL:   Patient will meet greater than or equal to 90% of their needs  MONITOR:   PO intake, Supplement acceptance  REASON FOR ASSESSMENT:   Consult Assessment of nutrition requirement/status, Wound healing  ASSESSMENT:   89 yo male admitted with bradycardia, weakness. PMH includes CAD, HTN, COPD, arthritis, HLD, mitral regurgitation, BPH, MVP, CKD, anemia, GERD, basal cell cancer.  Family in room reports that patient has been eating poorly for the past week d/t acute illness. He usually uses a cane or walker to ambulate, but has required a wheelchair for the past 3-4 days d/t progressive weakness and leg pain from infection. He likes Ensure supplements. RD provided him a [employer] and he liked it. He typically has a good appetite and eats pretty good. Family thinks patient would do better with a pureed diet, RD to change to dysphagia 1 (pureed) with thin liquids.  Weight history reviewed. Patient has lost 8% of usual weight within the past month. He meets criteria for severe malnutrition in the context of acute illness given moderate depletion of fat and muscle mass with severe weight loss.   Labs reviewed. Phos 4.7, mag 1.6 CBG: 108  Medications reviewed and include ferrous sulfate , protonix , lokelma . Received IV mag sulfate for repletion this morning.   NUTRITION - FOCUSED PHYSICAL EXAM:  Flowsheet Row Most Recent Value  Orbital Region Mild depletion  Upper Arm  Region Mild depletion  Thoracic and Lumbar Region Moderate depletion  Buccal Region Moderate depletion  Temple Region Moderate depletion  Clavicle Bone Region Mild depletion  Clavicle and Acromion Bone Region Moderate depletion  Scapular Bone Region Mild depletion  Dorsal Hand Mild depletion  Patellar Region Moderate depletion  Anterior Thigh Region Moderate depletion  Posterior Calf Region Moderate depletion  Edema (RD Assessment) Mild  Hair Reviewed  Eyes Reviewed  Mouth Reviewed  Skin Reviewed  Nails Reviewed       Diet Order:   Diet Order             Diet renal with fluid restriction Fluid restriction: 2000 mL Fluid; Room service appropriate? Yes; Fluid consistency: Thin  Diet effective now                   EDUCATION NEEDS:   No education needs have been identified at this time  Skin:  Skin Assessment: Skin Integrity Issues: Skin Integrity Issues:: Stage II, Other (Comment) Stage II: L & R perineum Other: non pressure wound pretibial L & R  Last BM:  PTA  Height:   Ht Readings from Last 1 Encounters:  04/18/23 6' (1.829 m)    Weight:   Wt Readings from Last 1 Encounters:  04/19/23 73 kg    Ideal Body Weight:  80.9 kg  BMI:  Body mass index is 21.83 kg/m.  Estimated Nutritional Needs:   Kcal:  1800-2000  Protein:  85-100 gm  Fluid:  1.8-2 L   Barnet Boots RD, LDN, CNSC Contact via secure chat. If unavailable, use group chat "RD  Inpatient."

## 2023-04-20 DIAGNOSIS — E875 Hyperkalemia: Secondary | ICD-10-CM | POA: Diagnosis not present

## 2023-04-20 DIAGNOSIS — E8729 Other acidosis: Secondary | ICD-10-CM | POA: Diagnosis not present

## 2023-04-20 DIAGNOSIS — N1832 Chronic kidney disease, stage 3b: Secondary | ICD-10-CM | POA: Diagnosis not present

## 2023-04-20 DIAGNOSIS — J9601 Acute respiratory failure with hypoxia: Secondary | ICD-10-CM

## 2023-04-20 DIAGNOSIS — N179 Acute kidney failure, unspecified: Secondary | ICD-10-CM | POA: Diagnosis not present

## 2023-04-20 LAB — CBC
HCT: 22.7 % — ABNORMAL LOW (ref 39.0–52.0)
Hemoglobin: 7.7 g/dL — ABNORMAL LOW (ref 13.0–17.0)
MCH: 29.4 pg (ref 26.0–34.0)
MCHC: 33.9 g/dL (ref 30.0–36.0)
MCV: 86.6 fL (ref 80.0–100.0)
Platelets: 97 10*3/uL — ABNORMAL LOW (ref 150–400)
RBC: 2.62 MIL/uL — ABNORMAL LOW (ref 4.22–5.81)
RDW: 19.9 % — ABNORMAL HIGH (ref 11.5–15.5)
WBC: 3.7 10*3/uL — ABNORMAL LOW (ref 4.0–10.5)
nRBC: 0 % (ref 0.0–0.2)

## 2023-04-20 LAB — TYPE AND SCREEN
ABO/RH(D): O NEG
Antibody Screen: NEGATIVE
Unit division: 0

## 2023-04-20 LAB — BASIC METABOLIC PANEL WITH GFR
Anion gap: 15 (ref 5–15)
BUN: 109 mg/dL — ABNORMAL HIGH (ref 8–23)
CO2: 23 mmol/L (ref 22–32)
Calcium: 7.8 mg/dL — ABNORMAL LOW (ref 8.9–10.3)
Chloride: 99 mmol/L (ref 98–111)
Creatinine, Ser: 5.63 mg/dL — ABNORMAL HIGH (ref 0.61–1.24)
GFR, Estimated: 9 mL/min — ABNORMAL LOW (ref >60)
Glucose, Bld: 159 mg/dL — ABNORMAL HIGH (ref 70–99)
Potassium: 4.6 mmol/L (ref 3.5–5.1)
Sodium: 137 mmol/L (ref 135–145)

## 2023-04-20 LAB — MAGNESIUM: Magnesium: 2.2 mg/dL (ref 1.7–2.4)

## 2023-04-20 LAB — POTASSIUM
Potassium: 4.7 mmol/L (ref 3.5–5.1)
Potassium: 4.7 mmol/L (ref 3.5–5.1)
Potassium: 4.6 mmol/L (ref 3.5–5.1)
Potassium: 5 mmol/L (ref 3.5–5.1)

## 2023-04-20 LAB — BPAM RBC
Blood Product Expiration Date: 202505132359
ISSUE DATE / TIME: 202504180821
Unit Type and Rh: 9500

## 2023-04-20 MED ORDER — SODIUM CHLORIDE 0.9 % IV SOLN
2.0000 g | INTRAVENOUS | Status: DC
  Administered 2023-04-20: 2 g via INTRAVENOUS
  Filled 2023-04-20: qty 20

## 2023-04-20 MED ORDER — GLYCOPYRROLATE 0.2 MG/ML IJ SOLN: 0.2000 mg | INTRAMUSCULAR | Status: DC | PRN

## 2023-04-20 MED ORDER — SODIUM CHLORIDE 0.9 % IV SOLN: INTRAVENOUS | Status: DC

## 2023-04-20 MED ORDER — LORAZEPAM 2 MG/ML IJ SOLN: 2.0000 mg | INTRAMUSCULAR | Status: DC | PRN

## 2023-04-20 MED ORDER — HALOPERIDOL LACTATE 5 MG/ML IJ SOLN: 2.5000 mg | INTRAMUSCULAR | Status: DC | PRN

## 2023-04-20 MED ORDER — GLYCOPYRROLATE 1 MG PO TABS: 1.0000 mg | ORAL_TABLET | ORAL | Status: DC | PRN

## 2023-04-20 MED ORDER — ACETAMINOPHEN 325 MG PO TABS: 650.0000 mg | ORAL_TABLET | Freq: Four times a day (QID) | ORAL | Status: DC | PRN

## 2023-04-20 MED ORDER — HYDROMORPHONE HCL-NACL 50-0.9 MG/50ML-% IV SOLN
0.0000 mg/h | INTRAVENOUS | Status: DC
  Administered 2023-04-20: 1 mg/h via INTRAVENOUS
  Filled 2023-04-20: qty 50

## 2023-04-20 MED ORDER — ONDANSETRON HCL 4 MG/2ML IJ SOLN: 4.0000 mg | Freq: Four times a day (QID) | INTRAMUSCULAR | Status: DC | PRN

## 2023-04-20 MED ORDER — SODIUM ZIRCONIUM CYCLOSILICATE 10 G PO PACK
10.0000 g | PACK | Freq: Three times a day (TID) | ORAL | Status: DC
  Filled 2023-04-20: qty 1

## 2023-04-20 MED ORDER — ONDANSETRON 4 MG PO TBDP: 4.0000 mg | ORAL_TABLET | Freq: Four times a day (QID) | ORAL | Status: DC | PRN

## 2023-04-20 MED ORDER — PANTOPRAZOLE SODIUM 40 MG IV SOLR
40.0000 mg | INTRAVENOUS | Status: DC
  Administered 2023-04-20: 40 mg via INTRAVENOUS
  Filled 2023-04-20: qty 10

## 2023-04-20 MED ORDER — ACETAMINOPHEN 650 MG RE SUPP: 650.0000 mg | Freq: Four times a day (QID) | RECTAL | Status: DC | PRN

## 2023-04-20 MED ORDER — METRONIDAZOLE 500 MG/100ML IV SOLN
500.0000 mg | Freq: Two times a day (BID) | INTRAVENOUS | Status: DC
  Administered 2023-04-20: 500 mg via INTRAVENOUS
  Filled 2023-04-20: qty 100

## 2023-04-20 MED ORDER — POLYVINYL ALCOHOL 1.4 % OP SOLN: 1.0000 [drp] | Freq: Four times a day (QID) | OPHTHALMIC | Status: DC | PRN

## 2023-04-20 MED ORDER — HYDROMORPHONE BOLUS VIA INFUSION: 1.0000 mg | INTRAVENOUS | Status: DC | PRN

## 2023-04-23 LAB — CULTURE, BLOOD (ROUTINE X 2)
Culture: NO GROWTH
Culture: NO GROWTH
Special Requests: ADEQUATE

## 2023-05-02 ENCOUNTER — Other Ambulatory Visit

## 2023-05-02 ENCOUNTER — Ambulatory Visit

## 2023-05-02 NOTE — Progress Notes (Signed)
 NAME:  [name], MRN:  [id number], DOB:  [birth year], LOS: 2 ADMISSION DATE:  04/18/2023, CONSULTATION DATE:  04/18/2023  REFERRING MD: Jiles Mote, PA-C, CHIEF COMPLAINT:  Hyperkalemia    History of Present Illness:  A 89 yr old male patient with CAD, HTN, dyslipidemia, CKD stage-3b, gout, chronic fe def anemia, moderate PCM who presented to ED with generalized weakness and low oral intake for 2 days. He was discharged from [facility] 04/08/2023 after 4 days admission for right lower limb necrotizing fasciitis, s/p 2 week course of Linezolid . For 2 days, he has low oral intake, fatigue, and 2 loose BM without melena or blood per rectum. He has no f/c/r, N/V, abd pain, CP, SOB, fall, syncope, dizziness, wheezing, cough, or dysuria. Continues to take home meds. ACEI and Bactrim were d/c when he was admitted to [facility] due to AKI. Baseline Cr is 1.7 and had Cr 2 in [facility]. He was found in slow Afib (bradycardia 30s), AKI, Bicarb 14, and K 6.4. Given 500 cc NS 0.9%, Albuterol  neb, D50 ampoule, insulin  5 units IV, and Ca gluconate 1 gm. Lokelma  is ordered.  Seen by cardiology and placed in SQ pacing PRN.  No smoking or illicit drug use, and drinks one alcoholic drink daily.    Pertinent  Medical History  CAD, HTN, dyslipidemia, CKD stage-3b, gout, chronic fe def anemia, moderate PCM, necrotizing fasciitis (4/4-07/2023)   Significant Hospital Events: Including procedures, antibiotic start and stop dates in addition to other pertinent events   4/17:  NS 0.9% 500 cc bolus, Albuterol  neb, D50 ampoule, insulin  5 units IV, and Ca gluconate 1 gm. Lokelma  is ordered. Seen by cardiology and placed in SQ pacing PRN. 4/18 NGT removed per pt wishes, transfused 1u PRBC, anuric  Interim History / Subjective:  No complaints per pt, less confused today per wife Temp down 91.5 overnight, WBC down, H/H stable K 4.6, BUN/ sCr unchanged, remains anuric, 60ml on bladder scan Congested cough  Objective   Blood pressure  (!) 129/48, pulse 60, temperature (!) 92 F (33.3 C), temperature source Rectal, resp. rate 19, height 6' (1.829 m), weight 87.4 kg, SpO2 92%.        Intake/Output Summary (Last 24 hours) at May 19, 2023 0912 Last data filed at May 19, 2023 0600 Gross per 24 hour  Intake 2439.11 ml  Output 0 ml  Net 2439.11 ml   Filed Weights   04/18/23 1158 04/19/23 0324 19-May-2023 0500  Weight: 72.6 kg 73 kg 87.4 kg    Examination: General:  AoC frail, thin elderly male sitting upright in bed  HEENT: MM pink/dried blood to chapped lips, glasses, pupils 3/r, small amount of drool to left face Neuro: Awakens to verbal, oriented to person, not time, and states [facility], recognizes wife, follows simple commands, generalized weakness CV: rr ir- 2AVB vs intermittent afib, +murmur PULM:  shallow, congested, weak cough, diffuse rhonchi, diminished in bases, 2L at 93% GI: soft, bs hypo, NT, purwick Extremities: warm/dry, LE wrapped in kerlix  Labs> K 4.6, bicarb 17> 23, BUN/ sCr 102/ 5.61> 109/ 5.63, mag 2.2, H/H 7.7/22.7, plts 116> 97 anuric  Resolved Hospital Problem list   Lactic acidosis  Assessment & Plan:  AKI/CKD-3b Hyperkalemia - renal US  c/w medical disease, no hydro/ obstruction P:  - cont bicarb and oral lokelmia if able.  Pt did not want to continue NGT for meds> d/c'd 4/18 - BMET q12 - cont bladder scan prn - not a HD candidate - nephrology following,  appreciate recs - ongoing GOC pending trajectory of renal recovery   Thrombocytopenia, chronic  Fe def anemia, chronic - H/H stable after 1unit PRBC 4/18, trend CBC - Fe if able - need to send stool H/O when able - PPI   CAD, HTN, dyslipidemia, slow Afib, CHB placed in SQ pacing PRN, in setting of metabolic derangements Echo: EF 60%, RVSP 50.8 mmHg, mildly reduced RV, mod MR w/ mild MVP, mild/ mod TR, mod PR - HF following, appreciate input, remains in 2AVB  vs ?intermittent afib overnight - dopamine  prn for bradycardia>has not  needed thus far - cont to correct metabolic derangements as able  Hypoxia, suspect aspiration event +/- vascular congestion - NGT out per pt wishes 4/18, since more congested cough, poor secretion clearance and shallow WOB.  CXR 4/17 without acute process.  +6.5L - cont supplemental O2 prn - aspiration precautions - start ctx/ flagyl  for aspiration coverage given drop in temp and WBC - pending SLP - hold off on lasix  for now - if further respiratory compromise> recs to transition to comfort care.  Remains DNR/ DNI - pulmonary hygiene as tolerated   Gout - hold pta meds for now  Moderate PCM  - RD consulted  Sacral bedsore - [facility]   GOC - ongoing, remains DNR/ DNI, likely transition to comfort if any further decompensation or lack of improvement  Best Practice (right click and "Reselect all SmartList Selections" daily)   Diet/type: pending SLP DVT prophylaxis SCD Pressure ulcer(s): present on admission  GI prophylaxis: H2B Lines: N/A Foley:  N/A Code Status:  DNR/ DNI Last date of multidisciplinary goals of care discussion []  4/18  Wife updated at bedside 2023-05-14, recommended comfort if any further deterioration.   Labs   CBC: Recent Labs  Lab 04/16/23 1352 04/18/23 1157 04/19/23 0247 04/19/23 1146 2023-05-14 0340  WBC 11.5* 7.4 4.9  --  3.7*  NEUTROABS 10.8*  --   --   --   --   HGB 8.9* 7.4* 6.7* 8.2* 7.7*  HCT 26.7* 23.1* 20.0* 24.7* 22.7*  MCV 89.3 91.3 87.3  --  86.6  PLT 177 129* 116*  --  97*    Basic Metabolic Panel: Recent Labs  Lab 04/18/23 1157 04/18/23 1249 04/18/23 1314 04/18/23 1751 04/18/23 2034 04/19/23 0247 04/19/23 0248 04/19/23 1439 04/19/23 1848 04/19/23 2222 2023-05-14 0116 05/14/23 0340 2023-05-14 0342  NA 136  --   --  138  --  138  --  137  --   --   --  137  --   K 6.1*  --    < > 5.6*  5.6*   < > 5.7*   < > 4.9 4.7 4.9 4.7 4.6 4.7  CL 109  --   --  111  --  109  --  104  --   --   --  99  --   CO2 14*  --   --  11*  --  17*  --   17*  --   --   --  23  --   GLUCOSE 119*  --   --  122*  --  131*  --  149*  --   --   --  159*  --   BUN 102*  --   --  105*  --  102*  --  106*  --   --   --  109*  --   CREATININE 5.17*  --   --  5.42*  --  5.61*  --  5.73*  --   --   --  5.63*  --   CALCIUM  8.4*  --   --  8.4*  --  8.0*  --  8.3*  --   --   --  7.8*  --   MG  --  1.8  --   --   --  1.6*  --   --   --   --   --  2.2  --   PHOS  --   --   --  4.6  --  4.7*  --   --   --   --   --   --   --    < > = values in this interval not displayed.   GFR: Estimated Creatinine Clearance: 8.6 mL/min (A) (by C-G formula based on SCr of 5.63 mg/dL (H)). Recent Labs  Lab 04/16/23 1352 04/18/23 1157 04/18/23 1305 04/18/23 1751 04/18/23 2324 04/19/23 0247 April 26, 2023 0340  WBC 11.5* 7.4  --   --   --  4.9 3.7*  LATICACIDVEN  --   --  2.5* 5.7* 4.4*  --   --     Liver Function Tests: Recent Labs  Lab 04/18/23 1249 04/18/23 1751  AST 34  --   ALT 18  --   ALKPHOS 62  --   BILITOT 0.3  --   PROT 4.8*  --   ALBUMIN 2.3* 2.1*   No results for input(s): "LIPASE", "AMYLASE" in the last 168 hours. No results for input(s): "AMMONIA" in the last 168 hours.  ABG    Component Value Date/Time   PHART 7.391 04/30/2011 1058   PCO2ART 31.3 (L) 04/30/2011 1058   PO2ART 96.0 04/30/2011 1058   HCO3 19.2 (L) 04/30/2011 1100   TCO2 20 04/30/2011 1100   ACIDBASEDEF 6.0 (H) 04/30/2011 1100   O2SAT 66.0 04/30/2011 1100     Coagulation Profile: No results for input(s): "INR", "PROTIME" in the last 168 hours.  Cardiac Enzymes: No results for input(s): "CKTOTAL", "CKMB", "CKMBINDEX", "TROPONINI" in the last 168 hours.  HbA1C: No results found for: "HGBA1C"  CBG: Recent Labs  Lab 04/19/23 0520  GLUCAP 108*  Allergies Allergies  Allergen Reactions   Tape Other (See Comments)    Please use SENSITIVE TAPE or COBAN WRAP- SKIN IS VERY FRAGILE!!   Sodium Bicarbonate  Other (See Comments)    Made the patient feel "unwell, not normal."      Home Medications  Prior to Admission medications   Medication Sig Start Date End Date Taking? Authorizing Provider  acetaminophen  (TYLENOL ) 325 MG tablet Take 650 mg by mouth every 6 (six) hours as needed for mild pain or headache.    [provider]  allopurinol  (ZYLOPRIM ) 300 MG tablet Take 150 mg by mouth daily.     [provider]  aspirin  EC 81 MG tablet Take 81 mg by mouth See admin instructions. Take 81 mg by mouth on Mondays and Wednesdays- swallow whole    [provider]  atorvastatin  (LIPITOR) 10 MG tablet TAKE 1 TABLET (10 MG TOTAL) BY MOUTH DAILY AT 6 PM. 05/17/22   Avanell Leigh, MD  diltiazem  (CARDIZEM  CD) 120 MG 24 hr capsule Take 1 capsule (120 mg total) by mouth daily. Please call [phone] to schedule a  July appointment for future refills. Thank you. Patient taking differently: Take 120 mg by mouth in the morning. 02/12/23 05/13/23  Avanell Leigh, MD  ketoconazole (NIZORAL) 2 % cream Apply 1 Application topically See admin instructions. Apply to affected area of the buttocks region 2 times a day as directed 04/02/23   [provider]  leptospermum manuka honey (MEDIHONEY) PSTE paste Apply 1 Application topically daily. 04/09/23   Leona Rake, MD  linezolid  (ZYVOX ) 600 MG tablet Take 1 tablet (600 mg total) by mouth every 12 (twelve) hours for 10 days. 04/08/23 04/18/23  Manandhar, Sabina, MD  liver oil-zinc  oxide (DESITIN) 40 % ointment Apply topically 3 (three) times daily. 04/08/23   Leona Rake, MD  omeprazole  (PRILOSEC  OTC) 20 MG tablet Take 20 mg by mouth every other day.    [provider]  OVER THE COUNTER MEDICATION Place 1 drop into both eyes See admin instructions. Thera Tears- Instill 1 drop into both eyes three times a day as needed for dryness    [provider]  polyethylene glycol (MIRALAX  / GLYCOLAX ) packet Take 17 g by mouth daily as needed for mild constipation or moderate constipation.     [provider]  UNKNOWN TO PATIENT Place 1 drop into both eyes See admin instructions. Unnamed allergy relief eye drops- Instill 1 drop into both eyes three times a day as needed for itching and/or allergies    [provider]     Critical care time: 35 min        Early Glisson, MSN, AG-ACNP-BC [facility] 01-May-2023, 9:12 AM  See Amion for pager If no response to pager , please call [phone] until 7pm After 7:00 pm call Elink  [phone]

## 2023-05-02 NOTE — Evaluation (Signed)
 Occupational Therapy Evaluation Patient Details Name: [name] MRN: [id number] DOB: [birth year] Today's Date: 26-Apr-2023   History of Present Illness   89 y.o. male presents to [facility] 04/18/23 for weakness and bradycardia. Admitted with cardiogenic shock 2/2 CHB and AKI. Recent admit to [facility] 4/3-4/7 for necrotizing fascitis of R LE. PMHx: AD, severe MR 2/2 to MVP, hypertension, hyperlipidemia, CKD stage IIIb, gout, chronic anemia     Clinical Impressions Pt reports ind at baseline with ADLs/functional mobility, lives with spouse. Pt currently needing up to max A for ADLs, max +2 for transfers with 2 person HHA, and max +2 for bed mobility. Pt with HR varying 56-79bpm throughout session. Pt presenting with impairments listed below, will follow acutely. D/c disposition TBD pending GOC process, to be reassessed next session.     If plan is discharge home, recommend the following:   Two people to help with walking and/or transfers;A lot of help with bathing/dressing/bathroom;Assistance with cooking/housework;Direct supervision/assist for medications management;Direct supervision/assist for financial management;Assist for transportation;Help with stairs or ramp for entrance     Functional Status Assessment   Patient has had a recent decline in their functional status and demonstrates the ability to make significant improvements in function in a reasonable and predictable amount of time.     Equipment Recommendations   Other (comment) (TBD)     Recommendations for Other Services   PT consult     Precautions/Restrictions   Precautions Precautions: Fall Precaution/Restrictions Comments: watch HR Restrictions Weight Bearing Restrictions Per Provider Order: No     Mobility Bed Mobility Overal bed mobility: Needs Assistance Bed Mobility: Sit to Supine, Supine to Sit     Supine to sit: +2 for safety/equipment, Mod assist Sit to supine: Max assist, +2 for physical  assistance, +2 for safety/equipment        Transfers Overall transfer level: Needs assistance Equipment used: 2 person hand held assist Transfers: Sit to/from Stand Sit to Stand: Max assist, +2 physical assistance, +2 safety/equipment                  Balance Overall balance assessment: Needs assistance, Mild deficits observed, not formally tested Sitting-balance support: Bilateral upper extremity supported, Feet supported Sitting balance-Leahy Scale: Fair     Standing balance support: Bilateral upper extremity supported, During functional activity, Reliant on assistive device for balance Standing balance-Leahy Scale: Zero Standing balance comment: unable to stand without MaxAx2                           ADL either performed or assessed with clinical judgement   ADL Overall ADL's : Needs assistance/impaired Eating/Feeding: Minimal assistance;Bed level   Grooming: Minimal assistance;Sitting;Bed level   Upper Body Bathing: Minimal assistance   Lower Body Bathing: Moderate assistance   Upper Body Dressing : Moderate assistance   Lower Body Dressing: Moderate assistance   Toilet Transfer: Moderate assistance;+2 for physical assistance;Maximal assistance   Toileting- Clothing Manipulation and Hygiene: Maximal assistance       Functional mobility during ADLs: Moderate assistance;Maximal assistance;+2 for physical assistance       Vision Baseline Vision/History: 1 Wears glasses;0 No visual deficits Vision Assessment?: No apparent visual deficits     Perception Perception: Within Functional Limits       Praxis Praxis: Not tested       Pertinent Vitals/Pain Pain Assessment Pain Assessment: No/denies pain     Extremity/Trunk Assessment Upper Extremity Assessment Upper Extremity Assessment: Generalized weakness  Lower Extremity Assessment Lower Extremity Assessment: Defer to PT evaluation   Cervical / Trunk Assessment Cervical / Trunk  Assessment: Normal   Communication Communication Communication: Impaired Factors Affecting Communication: Hearing impaired   Cognition Arousal: Alert Behavior During Therapy: Flat affect                                 Following commands: Intact       Cueing  General Comments   Cueing Techniques: Verbal cues  VSS   Exercises     Shoulder Instructions      Home Living Family/patient expects to be discharged to:: Private residence Living Arrangements: Spouse/significant other Available Help at Discharge: Family;Available 24 hours/day Type of Home: House Home Access: Level entry     Home Layout: One level     Bathroom Shower/Tub: Walk-in shower;Door   Bathroom Toilet: Handicapped height Bathroom Accessibility: Yes   Home Equipment: Rollator (4 wheels);Transport chair;Cane - single point;Shower seat;BSC/3in1;Hand held shower head;Grab bars - tub/shower;Wheelchair - manual;Cane - quad   Additional Comments: sleeps in recliner      Prior Functioning/Environment Prior Level of Function : Independent/Modified Independent             Mobility Comments: rollator with mod I ADLs Comments: mod I. wife drives.    OT Problem List: Decreased strength;Decreased range of motion;Decreased activity tolerance;Impaired balance (sitting and/or standing);Decreased safety awareness;Cardiopulmonary status limiting activity   OT Treatment/Interventions: Self-care/ADL training;Therapeutic exercise;Energy conservation;DME and/or AE instruction;Therapeutic activities;Patient/family education;Balance training      OT Goals(Current goals can be found in the care plan section)   Acute Rehab OT Goals Patient Stated Goal: none stated OT Goal Formulation: With patient Time For Goal Achievement: 05/04/23 Potential to Achieve Goals: Good ADL Goals Pt Will Perform Upper Body Dressing: with modified independence;sitting Pt Will Perform Lower Body Dressing: with  modified independence;sitting/lateral leans;sit to/from stand Pt Will Transfer to Toilet: with modified independence;ambulating;regular height toilet Pt Will Perform Tub/Shower Transfer: with modified independence;ambulating   OT Frequency:  Min 2X/week    Co-evaluation PT/OT/SLP Co-Evaluation/Treatment: Yes Reason for Co-Treatment: To address functional/ADL transfers;For patient/therapist safety PT goals addressed during session: Mobility/safety with mobility;Balance OT goals addressed during session: ADL's and self-care      AM-PAC OT "6 Clicks" Daily Activity     Outcome Measure Help from another person eating meals?: A Little Help from another person taking care of personal grooming?: A Little Help from another person toileting, which includes using toliet, bedpan, or urinal?: A Lot Help from another person bathing (including washing, rinsing, drying)?: A Lot Help from another person to put on and taking off regular upper body clothing?: A Lot Help from another person to put on and taking off regular lower body clothing?: A Lot 6 Click Score: 14   End of Session Equipment Utilized During Treatment: Gait belt;Rolling walker (2 wheels) Nurse Communication: Mobility status  Activity Tolerance: Patient tolerated treatment well Patient left: in bed;with call bell/phone within reach;with family/visitor present  OT Visit Diagnosis: Unsteadiness on feet (R26.81);Other abnormalities of gait and mobility (R26.89);Muscle weakness (generalized) (M62.81)                Time: 0940-1003 OT Time Calculation (min): 23 min Charges:  OT General Charges $OT Visit: 1 Visit OT Evaluation $OT Eval Moderate Complexity: 1 Mod  Semaya Vida K, OTD, OTR/L SecureChat Preferred Acute Rehab [phone]    Benedict Brain Koonce 05/03/23, 12:44  PM

## 2023-05-02 NOTE — Plan of Care (Signed)
  Problem: Education: Goal: Knowledge of General Education information will improve Description: Including pain rating scale, medication(s)/side effects and non-pharmacologic comfort measures Outcome: Progressing   Problem: Health Behavior/Discharge Planning: Goal: Ability to manage health-related needs will improve Outcome: Progressing   Problem: Clinical Measurements: Goal: Ability to maintain clinical measurements within normal limits will improve Outcome: Progressing   Problem: Coping: Goal: Level of anxiety will decrease Outcome: Progressing   Problem: Elimination: Goal: Will not experience complications related to bowel motility Outcome: Progressing Goal: Will not experience complications related to urinary retention Outcome: Progressing   Problem: Pain Managment: Goal: General experience of comfort will improve and/or be controlled Outcome: Progressing

## 2023-05-02 NOTE — Evaluation (Signed)
 Physical Therapy Evaluation Patient Details Name: [name] MRN: [id number] DOB: April 10, 1925 Today's Date: 2023-05-16  History of Present Illness  89 y.o. male presents to [facility] 04/18/23 for weakness and bradycardia. Admitted with cardiogenic shock 2/2 CHB and AKI. Recent admit to [facility] 4/3-4/7 for necrotizing fascitis of R LE. PMHx: AD, severe MR 2/2 to MVP, hypertension, hyperlipidemia, CKD stage IIIb, gout, chronic anemia   Clinical Impression  Pt in bed upon arrival with wife present and agreeable to PT eval. PTA, pt was ModI with a rollator. In today's session, pt was fatigued upon arrival with decreased activity tolerance. Pt was able to move from supine/sit with ModAx2 for safety. He required assistance to scoot hips forward towards the EOB. After sitting for a few minutes, pt was able to perform two partial stands with MaxAx2 and 2HH. He was unable to side-step and required MaxAx2 to laterally scoot with the bed pad. At this time, pt and family are requesting acute PT/OT services. Further d/c recommendations to be decided as family and care team work on plan of care. Acute PT to follow.  BP 119/44 52 BPM at rest, 52-70s during session SpO2 88-92% on RA, increased work of breathing      If plan is discharge home, recommend the following: A lot of help with walking and/or transfers;A lot of help with bathing/dressing/bathroom;Assistance with cooking/housework;Assist for transportation;Help with stairs or ramp for entrance   Can travel by private vehicle    No    Equipment Recommendations Other (comment) (TBD with plan of care updates)     Functional Status Assessment Patient has had a recent decline in their functional status and/or demonstrates limited ability to make significant improvements in function in a reasonable and predictable amount of time     Precautions / Restrictions Precautions Precautions: Fall Precaution/Restrictions Comments: watch HR Restrictions Weight  Bearing Restrictions Per Provider Order: No      Mobility  Bed Mobility Overal bed mobility: Needs Assistance Bed Mobility: Sit to Supine, Supine to Sit    Supine to sit: +2 for safety/equipment, Mod assist Sit to supine: Max assist, +2 for physical assistance, +2 for safety/equipment   General bed mobility comments: ModA to complete moving LE's off EOB and to raise trunk via 1HH. Assist to scoot forwards with ModA and bed pad. MaxAx2 to return to supine    Transfers Overall transfer level: Needs assistance Equipment used: 2 person hand held assist Transfers: Sit to/from Stand Sit to Stand: Max assist, +2 physical assistance, +2 safety/equipment    General transfer comment: MaxAx2 with pt holding onto posterior elbows. Assist to boost-up and steady. Unable to side-step. MaxAx2 to laterally scoot on EOB with bed pad    Ambulation/Gait  General Gait Details: unable this date     Balance Overall balance assessment: Needs assistance, Mild deficits observed, not formally tested Sitting-balance support: Bilateral upper extremity supported, Feet supported Sitting balance-Leahy Scale: Fair     Standing balance support: Bilateral upper extremity supported, During functional activity, Reliant on assistive device for balance Standing balance-Leahy Scale: Zero Standing balance comment: unable to stand without MaxAx2       Pertinent Vitals/Pain Pain Assessment Pain Assessment: No/denies pain    Home Living Family/patient expects to be discharged to:: Private residence Living Arrangements: Spouse/significant other Available Help at Discharge: Family;Available 24 hours/day Type of Home: House Home Access: Level entry       Home Layout: One level Home Equipment: Rollator (4 wheels);Transport chair;Cane - single point;Shower  seat;BSC/3in1;Hand held shower head;Grab bars - tub/shower;Wheelchair - manual;Cane - quad Additional Comments: sleeps in recliner    Prior Function Prior  Level of Function : Independent/Modified Independent    Mobility Comments: rollator with mod I ADLs Comments: mod I. wife drives.     Extremity/Trunk Assessment   Upper Extremity Assessment Upper Extremity Assessment: Defer to OT evaluation    Lower Extremity Assessment Lower Extremity Assessment: Generalized weakness (gauze dressings on B LE)    Cervical / Trunk Assessment Cervical / Trunk Assessment: Normal  Communication   Communication Communication: No apparent difficulties Factors Affecting Communication: Hearing impaired    Cognition Arousal: Alert Behavior During Therapy: Flat affect   PT - Cognitive impairments: No apparent impairments    Following commands: Intact       Cueing Cueing Techniques: Verbal cues        Exercises General Exercises - Lower Extremity Ankle Circles/Pumps: AROM, Both, 5 reps, Seated Long Arc Quad: AROM, Both, 10 reps, Seated   Assessment/Plan    PT Assessment Patient needs continued PT services  PT Problem List Decreased strength;Decreased activity tolerance;Decreased balance;Decreased mobility       PT Treatment Interventions DME instruction;Gait training;Functional mobility training;Therapeutic activities;Therapeutic exercise;Balance training;Neuromuscular re-education;Patient/family education;Wheelchair mobility training    PT Goals (Current goals can be found in the Care Plan section)  Acute Rehab PT Goals Patient Stated Goal: to feel better PT Goal Formulation: With patient/family Time For Goal Achievement: 05/04/23 Potential to Achieve Goals: Fair    Frequency Min 1X/week     Co-evaluation   Reason for Co-Treatment: To address functional/ADL transfers;For patient/therapist safety PT goals addressed during session: Mobility/safety with mobility;Balance         AM-PAC PT "6 Clicks" Mobility  Outcome Measure Help needed turning from your back to your side while in a flat bed without using bedrails?: A  Little Help needed moving from lying on your back to sitting on the side of a flat bed without using bedrails?: A Lot Help needed moving to and from a bed to a chair (including a wheelchair)?: Total Help needed standing up from a chair using your arms (e.g., wheelchair or bedside chair)?: Total Help needed to walk in hospital room?: Total Help needed climbing 3-5 steps with a railing? : Total 6 Click Score: 9    End of Session   Activity Tolerance: Patient limited by fatigue Patient left: in bed;with call bell/phone within reach;with family/visitor present Nurse Communication: Mobility status PT Visit Diagnosis: Other abnormalities of gait and mobility (R26.89);Muscle weakness (generalized) (M62.81);Unsteadiness on feet (R26.81)    Time: 9147-8295 PT Time Calculation (min) (ACUTE ONLY): 24 min   Charges:   PT Evaluation $PT Eval Low Complexity: 1 Low   PT General Charges $$ ACUTE PT VISIT: 1 Visit        Orysia Blas, PT, DPT Secure Chat Preferred  Rehab Office [phone]   Alissa April Adela Ades 05/20/2023, 10:43 AM

## 2023-05-02 NOTE — Progress Notes (Signed)
 Patient ID: [name], male   DOB: 1925/09/19, 89 y.o.   MRN: [id number] S: No new complaints or events overnight O:BP (!) 129/48   Pulse 60   Temp (!) 92 F (33.3 C) (Rectal)   Resp 19   Ht 6' (1.829 m)   Wt 87.4 kg   SpO2 92%   BMI 26.13 kg/m   Intake/Output Summary (Last 24 hours) at 2023-05-02 0825 Last data filed at 05-02-23 0600 Gross per 24 hour  Intake 2539.14 ml  Output 0 ml  Net 2539.14 ml   Intake/Output: I/O last 3 completed shifts: In: 4221.5 [P.O.:50; I.V.:3480; Blood:315; NG/GT:210; IV Piggyback:166.5] Out: 0   Intake/Output this shift:  No intake/output data recorded. Weight change: 14.8 kg Gen: frail, chronically ill-appearing CVS: RRR Resp: transmitted upper airway sounds Abd: +BS, soft, NT/ND Ext: bandage wrappings on BLE  Recent Labs  Lab 04/18/23 1157 04/18/23 1249 04/18/23 1314 04/18/23 1751 04/18/23 2034 04/19/23 0247 04/19/23 0248 04/19/23 1146 04/19/23 1439 04/19/23 1848 04/19/23 2222 May 02, 2023 0116 05-02-23 0340 2023/05/02 0342  NA 136  --   --  138  --  138  --   --  137  --   --   --  137  --   K 6.1*  --    < > 5.6*  5.6*   < > 5.7*   < > 5.1 4.9 4.7 4.9 4.7 4.6 4.7  CL 109  --   --  111  --  109  --   --  104  --   --   --  99  --   CO2 14*  --   --  11*  --  17*  --   --  17*  --   --   --  23  --   GLUCOSE 119*  --   --  122*  --  131*  --   --  149*  --   --   --  159*  --   BUN 102*  --   --  105*  --  102*  --   --  106*  --   --   --  109*  --   CREATININE 5.17*  --   --  5.42*  --  5.61*  --   --  5.73*  --   --   --  5.63*  --   ALBUMIN  --  2.3*  --  2.1*  --   --   --   --   --   --   --   --   --   --   CALCIUM  8.4*  --   --  8.4*  --  8.0*  --   --  8.3*  --   --   --  7.8*  --   PHOS  --   --   --  4.6  --  4.7*  --   --   --   --   --   --   --   --   AST  --  34  --   --   --   --   --   --   --   --   --   --   --   --   ALT  --  18  --   --   --   --   --   --   --   --   --   --   --   --    < > =  values in this interval not displayed.   Liver Function Tests: Recent Labs  Lab 04/18/23 1249 04/18/23 1751  AST 34  --   ALT 18  --   ALKPHOS 62  --   BILITOT 0.3  --   PROT 4.8*  --   ALBUMIN 2.3* 2.1*   No results for input(s): "LIPASE", "AMYLASE" in the last 168 hours. No results for input(s): "AMMONIA" in the last 168 hours. CBC: Recent Labs  Lab 04/16/23 1352 04/16/23 1352 04/18/23 1157 04/19/23 0247 04/19/23 1146 04-22-2023 0340  WBC 11.5*  --  7.4 4.9  --  3.7*  NEUTROABS 10.8*  --   --   --   --   --   HGB 8.9*   < > 7.4* 6.7* 8.2* 7.7*  HCT 26.7*  --  23.1* 20.0* 24.7* 22.7*  MCV 89.3  --  91.3 87.3  --  86.6  PLT 177  --  129* 116*  --  97*   < > = values in this interval not displayed.   Cardiac Enzymes: No results for input(s): "CKTOTAL", "CKMB", "CKMBINDEX", "TROPONINI" in the last 168 hours. CBG: Recent Labs  Lab 04/19/23 0520  GLUCAP 108*    Iron  Studies: No results for input(s): "IRON ", "TIBC", "TRANSFERRIN", "FERRITIN" in the last 72 hours. Studies/Results: DG Abd Portable 1V Result Date: 04/18/2023 CLINICAL DATA:  OG tube placement EXAM: PORTABLE ABDOMEN - 1 VIEW COMPARISON:  02/20/2017 FINDINGS: OG tube in place with the tip in the mid to distal stomach. No confluent airspace opacities or effusions. IMPRESSION: OG tube in the mid to distal stomach. Electronically Signed   By: Janeece Mechanic M.D.   On: 04/18/2023 21:39   US  RENAL Result Date: 04/18/2023 CLINICAL DATA:  Acute kidney injury EXAM: RENAL / URINARY TRACT ULTRASOUND COMPLETE COMPARISON:  09/19/2004 FINDINGS: Right Kidney: Renal measurements: 10.1 x 5.1 x 6.3 cm = volume: 169 mL. Mild increased echogenicity the renal cortex without significant thinning. No mass or hydronephrosis visualized. Left Kidney: Renal measurements: 9.0 x 5.2 x 4.3 cm = volume: 106 mL. Mild increased echogenicity the renal cortex without significant thinning. No mass or hydronephrosis visualized. Bladder: Appears normal  for degree of bladder distention. Other: Prostate is mildly enlarged. IMPRESSION: Increased echogenicity of the renal cortices suggestive of chronic medical renal disease. Electronically Signed   By: Elester Grim M.D.   On: 04/18/2023 16:38   ECHOCARDIOGRAM COMPLETE Result Date: 04/18/2023    ECHOCARDIOGRAM REPORT   Patient Name:   [name] Date of Exam: 04/18/2023 Medical Rec #:  [id number]            Height:       72.0 in Accession #:    [id number]           Weight:       160.1 lb Date of Birth:  28-Oct-1925             BSA:          1.938 m Patient Age:    89 years             BP:           115/42 mmHg Patient Gender: M                    HR:           33 bpm. Exam Location:  Inpatient Procedure: 2D Echo, Cardiac Doppler and Color Doppler (Both Spectral and Color  Flow Doppler were utilized during procedure). Indications:     Heart block, Complete I44.2  History:         Patient has prior history of Echocardiogram examinations, most                  recent 02/22/2016. CAD, COPD; Risk Factors:Hypertension.  Sonographer:     Astrid Blamer Referring Phys:  [id number] TAYLOR A PARCELLS Diagnosing Phys: Jules Oar MD IMPRESSIONS  1. Left ventricular ejection fraction, by estimation, is 60 to 65%. The left ventricle has normal function. The left ventricle has no regional wall motion abnormalities. Left ventricular diastolic parameters are indeterminate.  2. Right ventricular systolic function is mildly reduced. The right ventricular size is moderately enlarged. There is moderately elevated pulmonary artery systolic pressure. The estimated right ventricular systolic pressure is 50.8 mmHg.  3. Left atrial size was severely dilated.  4. Right atrial size was severely dilated.  5. The mitral valve is degenerative. Moderate mitral valve regurgitation. No evidence of mitral stenosis. There is mild prolapse of posterior leaflet of the mitral valve.  6. Tricuspid valve regurgitation is mild to moderate.   7. The aortic valve is tricuspid. There is mild calcification of the aortic valve. Aortic valve regurgitation is not visualized. Aortic valve sclerosis/calcification is present, without any evidence of aortic stenosis.  8. Pulmonic valve regurgitation is moderate. FINDINGS  Left Ventricle: Left ventricular ejection fraction, by estimation, is 60 to 65%. The left ventricle has normal function. The left ventricle has no regional wall motion abnormalities. The left ventricular internal cavity size was normal in size. There is  no left ventricular hypertrophy. Left ventricular diastolic parameters are indeterminate. Right Ventricle: The right ventricular size is moderately enlarged. No increase in right ventricular wall thickness. Right ventricular systolic function is mildly reduced. There is moderately elevated pulmonary artery systolic pressure. The tricuspid regurgitant velocity is 3.27 m/s, and with an assumed right atrial pressure of 8 mmHg, the estimated right ventricular systolic pressure is 50.8 mmHg. Left Atrium: Left atrial size was severely dilated. Right Atrium: Right atrial size was severely dilated. Pericardium: There is no evidence of pericardial effusion. Mitral Valve: The mitral valve is degenerative in appearance. There is mild prolapse of posterior leaflet of the mitral valve. Moderate mitral valve regurgitation. No evidence of mitral valve stenosis. Tricuspid Valve: The tricuspid valve is normal in structure. Tricuspid valve regurgitation is mild to moderate. No evidence of tricuspid stenosis. Aortic Valve: The aortic valve is tricuspid. There is mild calcification of the aortic valve. Aortic valve regurgitation is not visualized. Aortic valve sclerosis/calcification is present, without any evidence of aortic stenosis. Aortic valve mean gradient measures 6.0 mmHg. Aortic valve peak gradient measures 12.0 mmHg. Aortic valve area, by VTI measures 2.11 cm. Pulmonic Valve: The pulmonic valve was normal  in structure. Pulmonic valve regurgitation is moderate. No evidence of pulmonic stenosis. Aorta: The aortic root is normal in size and structure. IAS/Shunts: No atrial level shunt detected by color flow Doppler.  LEFT VENTRICLE PLAX 2D LVIDd:         5.60 cm   Diastology LVIDs:         3.70 cm   LV e' medial:    10.80 cm/s LV PW:         0.90 cm   LV E/e' medial:  12.0 LV IVS:        0.90 cm   LV e' lateral:   9.79 cm/s LVOT diam:     1.90 cm  LV E/e' lateral: 13.3 LV SV:         85 LV SV Index:   44 LVOT Area:     2.84 cm  RIGHT VENTRICLE RV S prime:     13.80 cm/s TAPSE (M-mode): 2.6 cm LEFT ATRIUM              Index        RIGHT ATRIUM           Index LA Vol (A2C):   132.0 ml 68.11 ml/m  RA Area:     24.50 cm LA Vol (A4C):   100.0 ml 51.60 ml/m  RA Volume:   63.70 ml  32.87 ml/m LA Biplane Vol: 115.0 ml 59.34 ml/m  AORTIC VALVE AV Area (Vmax):    2.11 cm AV Area (Vmean):   1.91 cm AV Area (VTI):     2.11 cm AV Vmax:           173.00 cm/s AV Vmean:          116.000 cm/s AV VTI:            0.401 m AV Peak Grad:      12.0 mmHg AV Mean Grad:      6.0 mmHg LVOT Vmax:         129.00 cm/s LVOT Vmean:        78.100 cm/s LVOT VTI:          0.299 m LVOT/AV VTI ratio: 0.75  AORTA Ao Root diam: 3.10 cm MITRAL VALVE                TRICUSPID VALVE MV Area (PHT): 3.10 cm     TR Peak grad:   42.8 mmHg MV Decel Time: 245 msec     TR Vmax:        327.00 cm/s MV E velocity: 130.00 cm/s MV A velocity: 60.30 cm/s   SHUNTS MV E/A ratio:  2.16         Systemic VTI:  0.30 m                             Systemic Diam: 1.90 cm Jules Oar MD Electronically signed by Jules Oar MD Signature Date/Time: 04/18/2023/2:33:12 PM    Final (Updated)    DG Chest Portable 1 View Result Date: 04/18/2023 CLINICAL DATA:  Bradycardia, weakness EXAM: PORTABLE CHEST 1 VIEW COMPARISON:  04/04/2023 FINDINGS: Single frontal view of the chest demonstrates stable enlargement of the cardiac silhouette. Stable scarring at the left lung  base. No acute airspace disease, effusion, or pneumothorax. No acute bony abnormality. IMPRESSION: 1. Stable chest, no acute process. Electronically Signed   By: Bobbye Burrow M.D.   On: 04/18/2023 14:31    sodium chloride    Intravenous Once   Chlorhexidine  Gluconate Cloth  6 each Topical Daily   feeding supplement  237 mL Oral TID BM   ferrous sulfate   325 mg Oral BID WC   leptospermum manuka honey  1 Application Topical Daily   multivitamin with minerals  1 tablet Per Tube Daily   pantoprazole   40 mg Oral QODAY    BMET    Component Value Date/Time   NA 137 04/23/23 0340   K 4.7 Apr 23, 2023 0342   CL 99 04/23/2023 0340   CO2 23 23-Apr-2023 0340   GLUCOSE 159 (H) 04-23-23 0340   BUN 109 (H) Apr 23, 2023 0340   CREATININE 5.63 (H) 04/23/2023 0340  CREATININE 1.86 (H) 08/17/2020 1119   CALCIUM  7.8 (L) 05-11-2023 0340   GFRNONAA 9 (L) May 11, 2023 0340   GFRNONAA 33 (L) 08/17/2020 1119   GFRAA 39 (L) 06/15/2017 0404   CBC    Component Value Date/Time   WBC 3.7 (L) 2023/05/11 0340   RBC 2.62 (L) May 11, 2023 0340   HGB 7.7 (L) 05/11/2023 0340   HGB 8.9 (L) 04/16/2023 1352   HCT 22.7 (L) 05-11-23 0340   PLT 97 (L) May 11, 2023 0340   PLT 177 04/16/2023 1352   MCV 86.6 2023/05/11 0340   MCH 29.4 May 11, 2023 0340   MCHC 33.9 May 11, 2023 0340   RDW 19.9 (H) May 11, 2023 0340   LYMPHSABS 0.2 (L) 04/16/2023 1352   MONOABS 0.4 04/16/2023 1352   EOSABS 0.0 04/16/2023 1352   BASOSABS 0.0 04/16/2023 1352    Assessment/Plan:   AKI/CKD stage [name] - presumably combination of volume depletion, hypotension in setting of concomitant ACE inhibition.  Renal US  with increased echogenicity c/w chronic medical renal disease, no obstruction.  Bladder scan with 60 mL this morning.   Agree he is a poor candidate for HD given advanced age, multiple co-morbidities and poor functional status.  Will manage medically.  Off lisinopril  and currently receiving IVF's and measures to lower K. Goals of care  discussion had by advanced heart failure team and PCCM.  Agree with no dialysis and may need to transition to comfort care.  Renal dose meds for eGFR <15.  Unfortunately no UOP documented.  BUN/Cr stable Avoid nephrotoxic medications including NSAIDs and iodinated intravenous contrast exposure unless the latter is absolutely indicated.   Preferred narcotic agents for pain control are hydromorphone , fentanyl , and methadone. Morphine should not be used.  Avoid Baclofen and avoid oral sodium phosphate  and magnesium  citrate based laxatives / bowel preps.  Continue strict Input and Output monitoring. Will monitor the patient closely with you and intervene or adjust therapy as indicated by changes in clinical status/labs    Hyperkalemia - due to #1 and ACE.  Aggressive fluid resuscitation with bicarb gtt.  Given albuterol  neb, IV insulin /D50, calcium  and is now on lokelma  10 grams tid.  Continue with conservative management and agree with holding off on dialysis for now. K now WNL at 4.7.  Cardiogenic shock - due to CHB. Advanced heart failure team following.  Not a PPM candidate.  Can use dopamine  if needed for bradycardia.  Complete heart block - in setting of hyperkalemia. Anemia - hgb down to 6.7 and received blood yesterday.  Hgb 7.7. continue to follow.  Transfuse prn, RLE necrotizing fasciitis - completed abx course  Benjamin Brands, MD [facility]

## 2023-05-02 DEATH — deceased

## 2023-05-23 ENCOUNTER — Ambulatory Visit

## 2023-05-23 ENCOUNTER — Ambulatory Visit: Admitting: Internal Medicine

## 2023-05-23 ENCOUNTER — Other Ambulatory Visit

## 2023-05-29 ENCOUNTER — Other Ambulatory Visit: Payer: PPO

## 2023-05-29 ENCOUNTER — Ambulatory Visit: Payer: PPO | Admitting: Internal Medicine

## 2023-06-02 NOTE — Death Summary Note (Signed)
 DEATH SUMMARY   Patient Details  Name: [name] MRN: [id number] DOB: 11/11/1925  Admission/Discharge Information   Admit Date:  May 07, 2023  Date of Death: Date of Death: 05-09-23  Time of Death: Time of Death: 1925 (Simultaneous filing. User may not have seen previous data.)  Length of Stay: 2  Referring Physician: Virgle Grime, MD     Diagnoses  Preliminary cause of death: renal failure x 7 days Secondary Diagnoses (including complications and co-morbidities):  CAD, HTN, dyslipidemia, slow Afib, CHB  Thrombocytopenia, chronic  Fe def anemia, chronic AKI/CKD-3b Hyperkalemia  Brief Hospital Course (including significant findings, care, treatment, and services provided and events leading to death)  A 89 yr old male patient with CAD, HTN, dyslipidemia, CKD stage-3b, gout, chronic fe def anemia, moderate PCM who presented to ED with generalized weakness and low oral intake for 2 days. He was discharged from [facility] 04/08/2023 after 4 days admission for right lower limb necrotizing fasciitis, s/p 2 week course of Linezolid . For 2 days, he has low oral intake, fatigue, and 2 loose BM without melena or blood per rectum. He has no f/c/r, N/V, abd pain, CP, SOB, fall, syncope, dizziness, wheezing, cough, or dysuria. Continues to take home meds. ACEI and Bactrim were d/c when he was admitted to [facility] due to AKI. Baseline Cr is 1.7 and had Cr 2 in [facility]. He was found in slow Afib (bradycardia 30s), AKI, Bicarb 14, and K 6.4. Given 500 cc NS 0.9%, Albuterol  neb, D50 ampoule, insulin  5 units IV, and Ca gluconate 1 gm. Lokelma  is ordered.   Seen by cardiology and placed in SQ pacing PRN.   No smoking or illicit drug use, and drinks one alcoholic drink daily.    With medical treatment his hyperkalemia improved and heart block resolved.  Unfortunately he developed progressive renal failure so was transitioned to comfort measures in alignment with his previously stated wishes and family  wishes.  Pertinent Labs and Studies  Significant Diagnostic Studies DG Abd Portable 1V Result Date: 05-07-23 CLINICAL DATA:  OG tube placement EXAM: PORTABLE ABDOMEN - 1 VIEW COMPARISON:  02/20/2017 FINDINGS: OG tube in place with the tip in the mid to distal stomach. No confluent airspace opacities or effusions. IMPRESSION: OG tube in the mid to distal stomach. Electronically Signed   By: Janeece Mechanic M.D.   On: 2023-05-07 21:39   US  RENAL Result Date: 2023/05/07 CLINICAL DATA:  Acute kidney injury EXAM: RENAL / URINARY TRACT ULTRASOUND COMPLETE COMPARISON:  09/19/2004 FINDINGS: Right Kidney: Renal measurements: 10.1 x 5.1 x 6.3 cm = volume: 169 mL. Mild increased echogenicity the renal cortex without significant thinning. No mass or hydronephrosis visualized. Left Kidney: Renal measurements: 9.0 x 5.2 x 4.3 cm = volume: 106 mL. Mild increased echogenicity the renal cortex without significant thinning. No mass or hydronephrosis visualized. Bladder: Appears normal for degree of bladder distention. Other: Prostate is mildly enlarged. IMPRESSION: Increased echogenicity of the renal cortices suggestive of chronic medical renal disease. Electronically Signed   By: Elester Grim M.D.   On: 05/07/23 16:38   ECHOCARDIOGRAM COMPLETE Result Date: 05-07-23    ECHOCARDIOGRAM REPORT   Patient Name:   [name] Date of Exam: May 07, 2023 Medical Rec #:  [id number]            Height:       72.0 in Accession #:    [id number]           Weight:  160.1 lb Date of Birth:  03/21/1925             BSA:          1.938 m Patient Age:    89 years             BP:           115/42 mmHg Patient Gender: M                    HR:           33 bpm. Exam Location:  Inpatient Procedure: 2D Echo, Cardiac Doppler and Color Doppler (Both Spectral and Color            Flow Doppler were utilized during procedure). Indications:     Heart block, Complete I44.2  History:         Patient has prior history of Echocardiogram  examinations, most                  recent 02/22/2016. CAD, COPD; Risk Factors:Hypertension.  Sonographer:     Astrid Blamer Referring Phys:  [id number] TAYLOR A PARCELLS Diagnosing Phys: Jules Oar MD IMPRESSIONS  1. Left ventricular ejection fraction, by estimation, is 60 to 65%. The left ventricle has normal function. The left ventricle has no regional wall motion abnormalities. Left ventricular diastolic parameters are indeterminate.  2. Right ventricular systolic function is mildly reduced. The right ventricular size is moderately enlarged. There is moderately elevated pulmonary artery systolic pressure. The estimated right ventricular systolic pressure is 50.8 mmHg.  3. Left atrial size was severely dilated.  4. Right atrial size was severely dilated.  5. The mitral valve is degenerative. Moderate mitral valve regurgitation. No evidence of mitral stenosis. There is mild prolapse of posterior leaflet of the mitral valve.  6. Tricuspid valve regurgitation is mild to moderate.  7. The aortic valve is tricuspid. There is mild calcification of the aortic valve. Aortic valve regurgitation is not visualized. Aortic valve sclerosis/calcification is present, without any evidence of aortic stenosis.  8. Pulmonic valve regurgitation is moderate. FINDINGS  Left Ventricle: Left ventricular ejection fraction, by estimation, is 60 to 65%. The left ventricle has normal function. The left ventricle has no regional wall motion abnormalities. The left ventricular internal cavity size was normal in size. There is  no left ventricular hypertrophy. Left ventricular diastolic parameters are indeterminate. Right Ventricle: The right ventricular size is moderately enlarged. No increase in right ventricular wall thickness. Right ventricular systolic function is mildly reduced. There is moderately elevated pulmonary artery systolic pressure. The tricuspid regurgitant velocity is 3.27 m/s, and with an assumed right atrial pressure of 8  mmHg, the estimated right ventricular systolic pressure is 50.8 mmHg. Left Atrium: Left atrial size was severely dilated. Right Atrium: Right atrial size was severely dilated. Pericardium: There is no evidence of pericardial effusion. Mitral Valve: The mitral valve is degenerative in appearance. There is mild prolapse of posterior leaflet of the mitral valve. Moderate mitral valve regurgitation. No evidence of mitral valve stenosis. Tricuspid Valve: The tricuspid valve is normal in structure. Tricuspid valve regurgitation is mild to moderate. No evidence of tricuspid stenosis. Aortic Valve: The aortic valve is tricuspid. There is mild calcification of the aortic valve. Aortic valve regurgitation is not visualized. Aortic valve sclerosis/calcification is present, without any evidence of aortic stenosis. Aortic valve mean gradient measures 6.0 mmHg. Aortic valve peak gradient measures 12.0 mmHg. Aortic valve area, by VTI measures 2.11 cm. Pulmonic  Valve: The pulmonic valve was normal in structure. Pulmonic valve regurgitation is moderate. No evidence of pulmonic stenosis. Aorta: The aortic root is normal in size and structure. IAS/Shunts: No atrial level shunt detected by color flow Doppler.  LEFT VENTRICLE PLAX 2D LVIDd:         5.60 cm   Diastology LVIDs:         3.70 cm   LV e' medial:    10.80 cm/s LV PW:         0.90 cm   LV E/e' medial:  12.0 LV IVS:        0.90 cm   LV e' lateral:   9.79 cm/s LVOT diam:     1.90 cm   LV E/e' lateral: 13.3 LV SV:         85 LV SV Index:   44 LVOT Area:     2.84 cm  RIGHT VENTRICLE RV S prime:     13.80 cm/s TAPSE (M-mode): 2.6 cm LEFT ATRIUM              Index        RIGHT ATRIUM           Index LA Vol (A2C):   132.0 ml 68.11 ml/m  RA Area:     24.50 cm LA Vol (A4C):   100.0 ml 51.60 ml/m  RA Volume:   63.70 ml  32.87 ml/m LA Biplane Vol: 115.0 ml 59.34 ml/m  AORTIC VALVE AV Area (Vmax):    2.11 cm AV Area (Vmean):   1.91 cm AV Area (VTI):     2.11 cm AV Vmax:            173.00 cm/s AV Vmean:          116.000 cm/s AV VTI:            0.401 m AV Peak Grad:      12.0 mmHg AV Mean Grad:      6.0 mmHg LVOT Vmax:         129.00 cm/s LVOT Vmean:        78.100 cm/s LVOT VTI:          0.299 m LVOT/AV VTI ratio: 0.75  AORTA Ao Root diam: 3.10 cm MITRAL VALVE                TRICUSPID VALVE MV Area (PHT): 3.10 cm     TR Peak grad:   42.8 mmHg MV Decel Time: 245 msec     TR Vmax:        327.00 cm/s MV E velocity: 130.00 cm/s MV A velocity: 60.30 cm/s   SHUNTS MV E/A ratio:  2.16         Systemic VTI:  0.30 m                             Systemic Diam: 1.90 cm Jules Oar MD Electronically signed by Jules Oar MD Signature Date/Time: 04/18/2023/2:33:12 PM    Final (Updated)    DG Chest Portable 1 View Result Date: 04/18/2023 CLINICAL DATA:  Bradycardia, weakness EXAM: PORTABLE CHEST 1 VIEW COMPARISON:  04/04/2023 FINDINGS: Single frontal view of the chest demonstrates stable enlargement of the cardiac silhouette. Stable scarring at the left lung base. No acute airspace disease, effusion, or pneumothorax. No acute bony abnormality. IMPRESSION: 1. Stable chest, no acute process. Electronically Signed   By: Bobbye Burrow M.D.   On:  04/18/2023 14:31    Microbiology No results found for this or any previous visit (from the past 240 hours).  Lab Basic Metabolic Panel: No results for input(s): "NA", "K", "CL", "CO2", "GLUCOSE", "BUN", "CREATININE", "CALCIUM ", "MG", "PHOS" in the last 168 hours. Liver Function Tests: No results for input(s): "AST", "ALT", "ALKPHOS", "BILITOT", "PROT", "ALBUMIN" in the last 168 hours. No results for input(s): "LIPASE", "AMYLASE" in the last 168 hours. No results for input(s): "AMMONIA" in the last 168 hours. CBC: No results for input(s): "WBC", "NEUTROABS", "HGB", "HCT", "MCV", "PLT" in the last 168 hours. Cardiac Enzymes: No results for input(s): "CKTOTAL", "CKMB", "CKMBINDEX", "TROPONINI" in the last 168 hours. Sepsis Labs: No results  for input(s): "PROCALCITON", "WBC", "LATICACIDVEN" in the last 168 hours.    Josiah Nigh 05/08/2023, 4:04 PM
